# Patient Record
Sex: Female | Born: 1955 | Race: White | Hispanic: No | Marital: Single | Smoking: Former smoker
Health system: Southern US, Community
[De-identification: ages and names within clinical notes are randomized; demographics above are authoritative.]

## PROBLEM LIST (undated history)

## (undated) DIAGNOSIS — R0902 Hypoxemia: Secondary | ICD-10-CM

## (undated) DIAGNOSIS — IMO0002 Reserved for concepts with insufficient information to code with codable children: Secondary | ICD-10-CM

## (undated) DIAGNOSIS — F1910 Other psychoactive substance abuse, uncomplicated: Secondary | ICD-10-CM

## (undated) DIAGNOSIS — D126 Benign neoplasm of colon, unspecified: Secondary | ICD-10-CM

## (undated) DIAGNOSIS — G709 Myoneural disorder, unspecified: Secondary | ICD-10-CM

## (undated) DIAGNOSIS — E785 Hyperlipidemia, unspecified: Secondary | ICD-10-CM

## (undated) DIAGNOSIS — I422 Other hypertrophic cardiomyopathy: Secondary | ICD-10-CM

## (undated) DIAGNOSIS — A0472 Enterocolitis due to Clostridium difficile, not specified as recurrent: Secondary | ICD-10-CM

## (undated) DIAGNOSIS — I10 Essential (primary) hypertension: Secondary | ICD-10-CM

## (undated) DIAGNOSIS — K5790 Diverticulosis of intestine, part unspecified, without perforation or abscess without bleeding: Secondary | ICD-10-CM

## (undated) DIAGNOSIS — K589 Irritable bowel syndrome without diarrhea: Secondary | ICD-10-CM

## (undated) DIAGNOSIS — J449 Chronic obstructive pulmonary disease, unspecified: Secondary | ICD-10-CM

## (undated) DIAGNOSIS — G4730 Sleep apnea, unspecified: Secondary | ICD-10-CM

## (undated) DIAGNOSIS — J439 Emphysema, unspecified: Secondary | ICD-10-CM

## (undated) DIAGNOSIS — F32A Depression, unspecified: Secondary | ICD-10-CM

## (undated) DIAGNOSIS — T7840XA Allergy, unspecified, initial encounter: Secondary | ICD-10-CM

## (undated) DIAGNOSIS — H269 Unspecified cataract: Secondary | ICD-10-CM

## (undated) DIAGNOSIS — K219 Gastro-esophageal reflux disease without esophagitis: Secondary | ICD-10-CM

## (undated) DIAGNOSIS — R011 Cardiac murmur, unspecified: Secondary | ICD-10-CM

## (undated) HISTORY — DX: Myoneural disorder, unspecified: G70.9

## (undated) HISTORY — DX: Cardiac murmur, unspecified: R01.1

## (undated) HISTORY — DX: Essential (primary) hypertension: I10

## (undated) HISTORY — DX: Chronic obstructive pulmonary disease, unspecified: J44.9

## (undated) HISTORY — DX: Irritable bowel syndrome, unspecified: K58.9

## (undated) HISTORY — DX: Emphysema, unspecified: J43.9

## (undated) HISTORY — PX: TUBAL LIGATION: SHX77

## (undated) HISTORY — PX: EYE SURGERY: SHX253

## (undated) HISTORY — DX: Hyperlipidemia, unspecified: E78.5

## (undated) HISTORY — DX: Reserved for concepts with insufficient information to code with codable children: IMO0002

## (undated) HISTORY — DX: Unspecified cataract: H26.9

## (undated) HISTORY — DX: Sleep apnea, unspecified: G47.30

## (undated) HISTORY — DX: Other psychoactive substance abuse, uncomplicated: F19.10

## (undated) HISTORY — DX: Diverticulosis of intestine, part unspecified, without perforation or abscess without bleeding: K57.90

## (undated) HISTORY — DX: Depression, unspecified: F32.A

## (undated) HISTORY — DX: Gastro-esophageal reflux disease without esophagitis: K21.9

## (undated) HISTORY — DX: Allergy, unspecified, initial encounter: T78.40XA

## (undated) HISTORY — DX: Hypoxemia: R09.02

## (undated) HISTORY — DX: Benign neoplasm of colon, unspecified: D12.6

## (undated) HISTORY — DX: Enterocolitis due to Clostridium difficile, not specified as recurrent: A04.72

## (undated) HISTORY — PX: PLANTAR FASCIECTOMY: SUR600

---

## 1998-06-14 DIAGNOSIS — R0981 Nasal congestion: Secondary | ICD-10-CM

## 1998-06-14 HISTORY — DX: Nasal congestion: R09.81

## 1998-07-09 ENCOUNTER — Ambulatory Visit (HOSPITAL_COMMUNITY): Admission: RE | Admit: 1998-07-09 | Discharge: 1998-07-09 | Payer: Self-pay | Admitting: Obstetrics and Gynecology

## 1998-07-09 ENCOUNTER — Encounter: Payer: Self-pay | Admitting: Obstetrics and Gynecology

## 1998-07-28 ENCOUNTER — Other Ambulatory Visit: Admission: RE | Admit: 1998-07-28 | Discharge: 1998-07-28 | Payer: Self-pay | Admitting: Obstetrics and Gynecology

## 1998-10-02 ENCOUNTER — Encounter: Payer: Self-pay | Admitting: Obstetrics and Gynecology

## 1998-10-07 ENCOUNTER — Inpatient Hospital Stay (HOSPITAL_COMMUNITY): Admission: RE | Admit: 1998-10-07 | Discharge: 1998-10-08 | Payer: Self-pay | Admitting: Obstetrics and Gynecology

## 2000-06-14 HISTORY — PX: ABDOMINAL HYSTERECTOMY: SHX81

## 2001-07-05 ENCOUNTER — Other Ambulatory Visit: Admission: RE | Admit: 2001-07-05 | Discharge: 2001-07-05 | Payer: Self-pay | Admitting: *Deleted

## 2003-03-28 ENCOUNTER — Encounter: Payer: Self-pay | Admitting: Family Medicine

## 2003-03-28 ENCOUNTER — Encounter: Admission: RE | Admit: 2003-03-28 | Discharge: 2003-03-28 | Payer: Self-pay | Admitting: Family Medicine

## 2007-01-10 ENCOUNTER — Ambulatory Visit: Payer: Self-pay | Admitting: Family Medicine

## 2007-01-10 ENCOUNTER — Other Ambulatory Visit: Admission: RE | Admit: 2007-01-10 | Discharge: 2007-01-10 | Payer: Self-pay | Admitting: Family Medicine

## 2007-01-10 ENCOUNTER — Encounter: Payer: Self-pay | Admitting: Family Medicine

## 2007-01-13 ENCOUNTER — Encounter (INDEPENDENT_AMBULATORY_CARE_PROVIDER_SITE_OTHER): Payer: Self-pay | Admitting: *Deleted

## 2007-01-13 ENCOUNTER — Ambulatory Visit: Payer: Self-pay | Admitting: Family Medicine

## 2007-01-18 LAB — CONVERTED CEMR LAB
ALT: 23 units/L (ref 0–35)
AST: 22 units/L (ref 0–37)
Alkaline Phosphatase: 91 units/L (ref 39–117)
BUN: 13 mg/dL (ref 6–23)
Basophils Relative: 0.2 % (ref 0.0–1.0)
Bilirubin, Direct: 0.1 mg/dL (ref 0.0–0.3)
CO2: 30 meq/L (ref 19–32)
Calcium: 10.1 mg/dL (ref 8.4–10.5)
Chloride: 107 meq/L (ref 96–112)
Creatinine, Ser: 0.8 mg/dL (ref 0.4–1.2)
Eosinophils Relative: 3.1 % (ref 0.0–5.0)
Glucose, Bld: 102 mg/dL — ABNORMAL HIGH (ref 70–99)
HDL: 45.4 mg/dL (ref >39.0)
Lymphocytes Relative: 34.9 % (ref 12.0–46.0)
Monocytes Relative: 8.7 % (ref 3.0–11.0)
Neutro Abs: 5.2 10*3/uL (ref 1.4–7.7)
Platelets: 277 10*3/uL (ref 150–400)
RBC: 4.81 M/uL (ref 3.87–5.11)
RDW: 12.4 % (ref 11.5–14.6)
Total Bilirubin: 0.8 mg/dL (ref 0.3–1.2)
Total CHOL/HDL Ratio: 4.9
Total Protein: 7.1 g/dL (ref 6.0–8.3)
VLDL: 22 mg/dL (ref 0–40)
WBC: 9.9 10*3/uL (ref 4.5–10.5)

## 2007-02-01 ENCOUNTER — Encounter: Payer: Self-pay | Admitting: Family Medicine

## 2007-02-01 ENCOUNTER — Encounter: Admission: RE | Admit: 2007-02-01 | Discharge: 2007-02-01 | Payer: Self-pay | Admitting: Family Medicine

## 2007-02-08 ENCOUNTER — Encounter (INDEPENDENT_AMBULATORY_CARE_PROVIDER_SITE_OTHER): Payer: Self-pay | Admitting: *Deleted

## 2007-02-09 ENCOUNTER — Encounter: Admission: RE | Admit: 2007-02-09 | Discharge: 2007-02-09 | Payer: Self-pay | Admitting: Family Medicine

## 2007-02-15 ENCOUNTER — Encounter (INDEPENDENT_AMBULATORY_CARE_PROVIDER_SITE_OTHER): Payer: Self-pay | Admitting: *Deleted

## 2007-03-02 ENCOUNTER — Encounter: Payer: Self-pay | Admitting: Family Medicine

## 2007-07-10 ENCOUNTER — Ambulatory Visit: Payer: Self-pay | Admitting: Internal Medicine

## 2007-07-10 DIAGNOSIS — J069 Acute upper respiratory infection, unspecified: Secondary | ICD-10-CM | POA: Insufficient documentation

## 2007-07-13 ENCOUNTER — Ambulatory Visit: Payer: Self-pay | Admitting: Family Medicine

## 2007-07-18 ENCOUNTER — Encounter: Payer: Self-pay | Admitting: Family Medicine

## 2007-07-24 LAB — CONVERTED CEMR LAB
ALT: 21 units/L (ref 0–35)
AST: 19 units/L (ref 0–37)
Albumin: 3.9 g/dL (ref 3.5–5.2)
Bilirubin, Direct: 0.1 mg/dL (ref 0.0–0.3)
Calcium: 9.7 mg/dL (ref 8.4–10.5)
Chloride: 106 meq/L (ref 96–112)
Cholesterol: 190 mg/dL (ref 0–200)
GFR calc Af Amer: 85 mL/min
GFR calc non Af Amer: 70 mL/min
Glucose, Bld: 93 mg/dL (ref 70–99)
LDL Cholesterol: 120 mg/dL — ABNORMAL HIGH (ref 0–99)
Total CHOL/HDL Ratio: 3.9
VLDL: 20 mg/dL (ref 0–40)

## 2007-07-27 ENCOUNTER — Ambulatory Visit: Payer: Self-pay | Admitting: Family Medicine

## 2007-07-28 ENCOUNTER — Telehealth (INDEPENDENT_AMBULATORY_CARE_PROVIDER_SITE_OTHER): Payer: Self-pay | Admitting: *Deleted

## 2008-03-07 ENCOUNTER — Ambulatory Visit: Payer: Self-pay | Admitting: Family Medicine

## 2008-03-07 ENCOUNTER — Encounter (INDEPENDENT_AMBULATORY_CARE_PROVIDER_SITE_OTHER): Payer: Self-pay | Admitting: *Deleted

## 2008-03-07 DIAGNOSIS — J209 Acute bronchitis, unspecified: Secondary | ICD-10-CM | POA: Insufficient documentation

## 2008-04-01 ENCOUNTER — Encounter: Payer: Self-pay | Admitting: Family Medicine

## 2008-04-01 ENCOUNTER — Ambulatory Visit: Payer: Self-pay | Admitting: Family Medicine

## 2008-04-01 DIAGNOSIS — R51 Headache: Secondary | ICD-10-CM | POA: Insufficient documentation

## 2008-04-01 DIAGNOSIS — R519 Headache, unspecified: Secondary | ICD-10-CM | POA: Insufficient documentation

## 2008-04-01 DIAGNOSIS — D235 Other benign neoplasm of skin of trunk: Secondary | ICD-10-CM | POA: Insufficient documentation

## 2008-04-02 ENCOUNTER — Telehealth (INDEPENDENT_AMBULATORY_CARE_PROVIDER_SITE_OTHER): Payer: Self-pay | Admitting: *Deleted

## 2008-04-09 ENCOUNTER — Encounter (INDEPENDENT_AMBULATORY_CARE_PROVIDER_SITE_OTHER): Payer: Self-pay | Admitting: *Deleted

## 2008-04-11 ENCOUNTER — Ambulatory Visit: Payer: Self-pay | Admitting: Family Medicine

## 2008-12-30 ENCOUNTER — Ambulatory Visit: Payer: Self-pay | Admitting: Family Medicine

## 2008-12-30 DIAGNOSIS — Z780 Asymptomatic menopausal state: Secondary | ICD-10-CM | POA: Insufficient documentation

## 2009-01-15 ENCOUNTER — Encounter (INDEPENDENT_AMBULATORY_CARE_PROVIDER_SITE_OTHER): Payer: Self-pay | Admitting: *Deleted

## 2009-01-20 ENCOUNTER — Ambulatory Visit: Payer: Self-pay | Admitting: Internal Medicine

## 2009-01-20 ENCOUNTER — Encounter: Admission: RE | Admit: 2009-01-20 | Discharge: 2009-01-20 | Payer: Self-pay | Admitting: Family Medicine

## 2009-01-21 ENCOUNTER — Encounter: Admission: RE | Admit: 2009-01-21 | Discharge: 2009-01-21 | Payer: Self-pay | Admitting: Family Medicine

## 2009-01-21 ENCOUNTER — Encounter: Payer: Self-pay | Admitting: Family Medicine

## 2009-01-22 ENCOUNTER — Encounter (INDEPENDENT_AMBULATORY_CARE_PROVIDER_SITE_OTHER): Payer: Self-pay | Admitting: *Deleted

## 2009-01-30 ENCOUNTER — Ambulatory Visit: Payer: Self-pay | Admitting: Internal Medicine

## 2009-01-30 ENCOUNTER — Encounter: Payer: Self-pay | Admitting: Internal Medicine

## 2009-01-30 ENCOUNTER — Encounter: Payer: Self-pay | Admitting: Family Medicine

## 2009-01-30 LAB — HM COLONOSCOPY

## 2009-02-09 ENCOUNTER — Encounter: Payer: Self-pay | Admitting: Internal Medicine

## 2009-02-14 ENCOUNTER — Ambulatory Visit: Payer: Self-pay | Admitting: Family Medicine

## 2009-02-14 DIAGNOSIS — K589 Irritable bowel syndrome without diarrhea: Secondary | ICD-10-CM | POA: Insufficient documentation

## 2009-02-19 ENCOUNTER — Ambulatory Visit: Payer: Self-pay | Admitting: Family Medicine

## 2009-02-19 ENCOUNTER — Encounter (INDEPENDENT_AMBULATORY_CARE_PROVIDER_SITE_OTHER): Payer: Self-pay | Admitting: *Deleted

## 2009-02-19 LAB — CONVERTED CEMR LAB
OCCULT 2: NEGATIVE
OCCULT 3: NEGATIVE

## 2009-02-28 ENCOUNTER — Ambulatory Visit: Payer: Self-pay | Admitting: Family Medicine

## 2009-02-28 DIAGNOSIS — I10 Essential (primary) hypertension: Secondary | ICD-10-CM | POA: Insufficient documentation

## 2009-03-04 ENCOUNTER — Encounter: Payer: Self-pay | Admitting: Family Medicine

## 2009-03-05 ENCOUNTER — Encounter: Payer: Self-pay | Admitting: Family Medicine

## 2009-03-10 ENCOUNTER — Ambulatory Visit: Payer: Self-pay | Admitting: Internal Medicine

## 2009-03-10 DIAGNOSIS — R5383 Other fatigue: Secondary | ICD-10-CM | POA: Insufficient documentation

## 2009-03-10 DIAGNOSIS — R5381 Other malaise: Secondary | ICD-10-CM

## 2009-03-11 ENCOUNTER — Telehealth: Payer: Self-pay | Admitting: Family Medicine

## 2009-03-11 LAB — CONVERTED CEMR LAB
ALT: 15 units/L (ref 0–35)
Alkaline Phosphatase: 89 units/L (ref 39–117)
Basophils Relative: 0.4 % (ref 0.0–3.0)
Bilirubin, Direct: 0 mg/dL (ref 0.0–0.3)
Chloride: 109 meq/L (ref 96–112)
Creatinine, Ser: 0.9 mg/dL (ref 0.4–1.2)
Eosinophils Relative: 1.8 % (ref 0.0–5.0)
Lymphocytes Relative: 29.6 % (ref 12.0–46.0)
MCV: 91.6 fL (ref 78.0–100.0)
Monocytes Absolute: 0.7 10*3/uL (ref 0.1–1.0)
Neutrophils Relative %: 61 % (ref 43.0–77.0)
RBC: 4.57 M/uL (ref 3.87–5.11)
Sodium: 145 meq/L (ref 135–145)
Total Protein: 7.2 g/dL (ref 6.0–8.3)
WBC: 9.8 10*3/uL (ref 4.5–10.5)

## 2009-03-18 ENCOUNTER — Encounter: Payer: Self-pay | Admitting: Physician Assistant

## 2009-03-18 ENCOUNTER — Ambulatory Visit: Payer: Self-pay | Admitting: Gastroenterology

## 2009-03-18 DIAGNOSIS — Z860100 Personal history of colon polyps, unspecified: Secondary | ICD-10-CM | POA: Insufficient documentation

## 2009-03-18 DIAGNOSIS — R197 Diarrhea, unspecified: Secondary | ICD-10-CM | POA: Insufficient documentation

## 2009-03-18 DIAGNOSIS — A09 Infectious gastroenteritis and colitis, unspecified: Secondary | ICD-10-CM | POA: Insufficient documentation

## 2009-03-19 ENCOUNTER — Ambulatory Visit: Payer: Self-pay | Admitting: Family Medicine

## 2009-03-19 DIAGNOSIS — F3289 Other specified depressive episodes: Secondary | ICD-10-CM | POA: Insufficient documentation

## 2009-03-19 DIAGNOSIS — R209 Unspecified disturbances of skin sensation: Secondary | ICD-10-CM | POA: Insufficient documentation

## 2009-03-19 DIAGNOSIS — F329 Major depressive disorder, single episode, unspecified: Secondary | ICD-10-CM

## 2009-03-21 ENCOUNTER — Encounter: Payer: Self-pay | Admitting: Family Medicine

## 2009-03-21 LAB — CONVERTED CEMR LAB
CRP, High Sensitivity: 6.6 — ABNORMAL HIGH (ref 0.00–5.00)
Folate: 11.9 ng/mL
HCV Ab: NEGATIVE
TSH: 1.05 microintl units/mL (ref 0.35–5.50)
Vitamin B-12: 330 pg/mL (ref 211–911)

## 2009-04-02 ENCOUNTER — Encounter: Payer: Self-pay | Admitting: Family Medicine

## 2009-04-14 ENCOUNTER — Ambulatory Visit: Payer: Self-pay | Admitting: Family Medicine

## 2009-04-14 DIAGNOSIS — IMO0001 Reserved for inherently not codable concepts without codable children: Secondary | ICD-10-CM | POA: Insufficient documentation

## 2009-04-16 ENCOUNTER — Telehealth (INDEPENDENT_AMBULATORY_CARE_PROVIDER_SITE_OTHER): Payer: Self-pay | Admitting: *Deleted

## 2009-05-05 ENCOUNTER — Telehealth: Payer: Self-pay | Admitting: Family Medicine

## 2009-05-30 ENCOUNTER — Ambulatory Visit: Payer: Self-pay | Admitting: Family Medicine

## 2009-06-03 ENCOUNTER — Encounter: Payer: Self-pay | Admitting: Family Medicine

## 2009-06-17 ENCOUNTER — Telehealth: Payer: Self-pay | Admitting: Family Medicine

## 2009-07-04 ENCOUNTER — Encounter: Payer: Self-pay | Admitting: Family Medicine

## 2009-07-18 ENCOUNTER — Ambulatory Visit: Payer: Self-pay | Admitting: Family Medicine

## 2009-07-18 DIAGNOSIS — K5900 Constipation, unspecified: Secondary | ICD-10-CM | POA: Insufficient documentation

## 2009-07-30 ENCOUNTER — Telehealth: Payer: Self-pay | Admitting: Family Medicine

## 2009-08-12 ENCOUNTER — Telehealth: Payer: Self-pay | Admitting: Family Medicine

## 2009-08-13 ENCOUNTER — Encounter: Payer: Self-pay | Admitting: Family Medicine

## 2009-08-13 ENCOUNTER — Telehealth (INDEPENDENT_AMBULATORY_CARE_PROVIDER_SITE_OTHER): Payer: Self-pay | Admitting: *Deleted

## 2009-08-18 ENCOUNTER — Encounter: Payer: Self-pay | Admitting: Family Medicine

## 2009-09-15 ENCOUNTER — Encounter: Payer: Self-pay | Admitting: Family Medicine

## 2009-11-27 ENCOUNTER — Encounter: Payer: Self-pay | Admitting: Family Medicine

## 2009-12-04 ENCOUNTER — Ambulatory Visit: Payer: Self-pay | Admitting: Family Medicine

## 2009-12-04 DIAGNOSIS — R05 Cough: Secondary | ICD-10-CM | POA: Insufficient documentation

## 2009-12-04 DIAGNOSIS — R059 Cough, unspecified: Secondary | ICD-10-CM | POA: Insufficient documentation

## 2010-01-02 ENCOUNTER — Telehealth: Payer: Self-pay | Admitting: Family Medicine

## 2010-01-12 ENCOUNTER — Ambulatory Visit: Payer: Self-pay | Admitting: Family Medicine

## 2010-01-14 ENCOUNTER — Telehealth: Payer: Self-pay | Admitting: Family Medicine

## 2010-01-15 ENCOUNTER — Ambulatory Visit: Payer: Self-pay | Admitting: Family Medicine

## 2010-01-19 ENCOUNTER — Telehealth: Payer: Self-pay | Admitting: Family Medicine

## 2010-01-19 ENCOUNTER — Telehealth (INDEPENDENT_AMBULATORY_CARE_PROVIDER_SITE_OTHER): Payer: Self-pay | Admitting: *Deleted

## 2010-02-23 ENCOUNTER — Telehealth (INDEPENDENT_AMBULATORY_CARE_PROVIDER_SITE_OTHER): Payer: Self-pay | Admitting: *Deleted

## 2010-02-23 ENCOUNTER — Encounter: Payer: Self-pay | Admitting: Family Medicine

## 2010-05-18 ENCOUNTER — Ambulatory Visit: Payer: Self-pay | Admitting: Family Medicine

## 2010-07-12 LAB — CONVERTED CEMR LAB
ALT: 22 units/L (ref 0–35)
AST: 23 units/L (ref 0–37)
AST: 24 units/L (ref 0–37)
Alkaline Phosphatase: 90 units/L (ref 39–117)
BUN: 13 mg/dL (ref 6–23)
Basophils Absolute: 0.2 10*3/uL — ABNORMAL HIGH (ref 0.0–0.1)
Basophils Relative: 0.6 % (ref 0.0–3.0)
Bilirubin, Direct: 0.1 mg/dL (ref 0.0–0.3)
Bilirubin, Direct: 0.1 mg/dL (ref 0.0–0.3)
Blood in Urine, dipstick: NEGATIVE
Calcium: 10 mg/dL (ref 8.4–10.5)
Chloride: 109 meq/L (ref 96–112)
Creatinine, Ser: 0.8 mg/dL (ref 0.4–1.2)
Direct LDL: 195.7 mg/dL
Eosinophils Relative: 3.8 % (ref 0.0–5.0)
GFR calc non Af Amer: 93.28 mL/min (ref >60)
Glucose, Bld: 67 mg/dL — ABNORMAL LOW (ref 70–99)
Glucose, Urine, Semiquant: NEGATIVE
HCT: 40.8 % (ref 36.0–46.0)
HDL: 49.3 mg/dL (ref >39.00)
Ketones, urine, test strip: NEGATIVE
Lymphocytes Relative: 49.5 % — ABNORMAL HIGH (ref 12.0–46.0)
Lymphs Abs: 4.1 10*3/uL — ABNORMAL HIGH (ref 0.7–4.0)
MCV: 88.5 fL (ref 78.0–100.0)
Monocytes Absolute: 1 10*3/uL (ref 0.1–1.0)
Monocytes Relative: 16.3 % — ABNORMAL HIGH (ref 3.0–12.0)
Neutrophils Relative %: 29.4 % — ABNORMAL LOW (ref 43.0–77.0)
Neutrophils Relative %: 35.4 % — ABNORMAL LOW (ref 43.0–77.0)
Nitrite: NEGATIVE
Platelets: 239 10*3/uL (ref 150.0–400.0)
Potassium: 4.6 meq/L (ref 3.5–5.1)
Protein, U semiquant: NEGATIVE
RBC: 4.61 M/uL (ref 3.87–5.11)
RDW: 12.6 % (ref 11.5–14.6)
Specific Gravity, Urine: <1.005
TSH: 1.12 microintl units/mL (ref 0.35–5.50)
Total Bilirubin: 0.9 mg/dL (ref 0.3–1.2)
Total CHOL/HDL Ratio: 5
Total Protein: 7.2 g/dL (ref 6.0–8.3)
Triglycerides: 162 mg/dL — ABNORMAL HIGH (ref 0.0–149.0)
VLDL: 32.4 mg/dL (ref 0.0–40.0)
VLDL: 35.4 mg/dL (ref 0.0–40.0)
WBC: 8.4 10*3/uL (ref 4.5–10.5)
WBC: 8.7 10*3/uL (ref 4.5–10.5)
pH: 6.5

## 2010-07-16 NOTE — Progress Notes (Signed)
Summary: ? RESTART CYMBALTA  Phone Note Call from Patient Call back at Home Phone [phone]   Caller: Patient Summary of Call: patient weaned off cymbalta about two weeks ago - she was taking cymbalta & celebrex & she thought she could stop taking one but she thinks she stopped taking the wrong one - she wants to know if she could start taking cymbalta again -- patient said she has an appt [id number] but didnt want to wait until then to ask  Initial call taken by: Okey Regal [place],  January 02, 2010 3:22 PM  Follow-up for Phone Call        pls advise.................Marland KitchenFelecia Deloach CMA  January 02, 2010 3:55 PM   Additional Follow-up for Phone Call Additional follow up Details #1::        she needs to start with 30 mg for at least a week and then increase 60 mg  daily Additional Follow-up by: Loreen Freud DO,  January 02, 2010 4:42 PM    Additional Follow-up for Phone Call Additional follow up Details #2::    pt aware rx sent to pharmacy and samples placed up front for pickup..............Marland KitchenFelecia Deloach CMA  January 02, 2010 4:47 PM   New/Updated Medications: CYMBALTA 60 MG CPEP (DULOXETINE HCL) Take 1 tab  daily Prescriptions: CYMBALTA 60 MG CPEP (DULOXETINE HCL) Take 1 tab  daily  #30 x 5   Entered by:   Jeremy Johann CMA   Authorized by:   Loreen Freud DO   Signed by:   Jeremy Johann CMA on 01/02/2010   Method used:   Faxed to ...       [facility] [address] [phone]* (retail)       [address]       [place], [place]  [address]       Ph: [phone]       Fax: [phone]   RxID:   [id number]

## 2010-07-16 NOTE — Medication Information (Signed)
Summary: Letter Regarding Lipitor & Skeletal Muscle Effects/[facility]  Letter Regarding Lipitor & Skeletal Muscle Effects/[facility]   Imported By: Lanelle Bal 03/23/2010 14:17:31  _____________________________________________________________________  External Attachment:    Type:   Image     Comment:   External Document

## 2010-07-16 NOTE — Miscellaneous (Signed)
Summary: PT Re Eval/[facility]  PT Re Eval/[facility]   Imported By: Lanelle Bal 07/22/2009 09:07:20  _____________________________________________________________________  External Attachment:    Type:   Image     Comment:   External Document

## 2010-07-16 NOTE — Progress Notes (Signed)
Summary: DISABILITY FORM  Phone Note Call from Patient Call back at Home Phone [phone]   Caller: Patient Call For: Loreen Freud DO Reason for Call: Talk to Doctor Summary of Call: PATIENT CALLING, REQUESTING TO SPEAK WITH DR. [name] ABOUT THE DISABLITIY FORM SHE DROPPED OFF ON 06-16-2009, & LEFT AT OUR FRONT DESK TO BE COMPLETED BY DR. [name].  REGARDING THE PREVIOUS FORM THAT WAS COMPLETED WITH THE "DATE OF RETURN TO WORK LISTED AS UNKNOWN", THIS WAS NOT ACCEPTED AS PREDICTED.  PATIENT REQUESTING TO TALK WITH DR. [name] ABOUT THIS. Initial call taken by: Magdalen Spatz [facility],  June 17, 2009 2:39 PM  Follow-up for Phone Call        pt states the forms need a date.  Follow-up by: Army Fossa CMA,  June 17, 2009 4:36 PM  Additional Follow-up for Phone Call Additional follow up Details #1::        pt will need f/u in 1 month---  with new papers if disability needs to con't Additional Follow-up by: Loreen Freud DO,  June 17, 2009 4:44 PM    Additional Follow-up for Phone Call Additional follow up Details #2::    pt aware.  Follow-up by: Army Fossa CMA,  June 17, 2009 4:48 PM

## 2010-07-16 NOTE — Assessment & Plan Note (Signed)
Summary: cpx/cbs   Vital Signs:  Patient profile:   55 year old female Height:      68.5 inches (173.99 cm) Weight:      205.38 pounds (93.35 kg) BMI:     30.88 Temp:     98.3 degrees F (36.83 degrees C) oral BP sitting:   130 / 72  (right arm) Cuff size:   regular  Vitals Entered By: Lucious Groves CMA (January 12, 2010 1:11 PM) CC: CPX./kb Is Patient Diabetic? No Pain Assessment Patient in pain? no      Comments Patient states that she is not taking vitamin d./kb   History of Present Illness: Pt here for cpe and labs---no pap--pt is s/p TAH.    Preventive Screening-Counseling & Management  Alcohol-Tobacco     Alcohol drinks/day: <1     Alcohol type: beer, Mead     Smoking Status: current     Smoking Cessation Counseling: yes     Smoke Cessation Stage: contemplative     Packs/Day: 0.75     Year Started: 1971     Passive Smoke Exposure: no  Caffeine-Diet-Exercise     Caffeine use/day: 4-6     Does Patient Exercise: yes     Type of exercise: 1/2 mile walk and weight machines     Times/week: 3-4  Hep-HIV-STD-Contraception     HIV Risk: no     Dental Visit-last 6 months no     SBE monthly: yes     SBE Education/Counseling: not indicated; SBE done regularly     Sun Exposure-Excessive: rarely  Safety-Violence-Falls     Seat Belt Use: yes  Current Medications (verified): 1)  Norvasc 5 Mg Tabs (Amlodipine Besylate) .Marland Kitchen.. 1 By Mouth Once Daily 2)  Robinul-Forte 2 Mg Tabs (Glycopyrrolate) .... Take 1 Qab Two Times A Day As Needed For Urgency 3)  Vitamin D 1000 Unit Tabs (Cholecalciferol) 4)  Celebrex 200 Mg Caps (Celecoxib) .Marland Kitchen.. 1 By Mouth Once Daily 5)  Cymbalta 60 Mg Cpep (Duloxetine Hcl) .... Take 1 Tab  Daily  Allergies (verified): 1)  ! Pcn 2)  ! Paregoric (Paregoric) 3)  ! * Novacain  Past History:  Past Medical History: Last updated: 03/18/2009 Headache Hypertension Adenomatous Colon Polyps Diverticulosis Hyperlipidemia Irritable Bowel  Syndrome C-Diff  Past Surgical History: Last updated: 01/10/2007 Hysterectomy-2002  Family History: Last updated: 2009-04-06 MOTHER-DECEASED FATHER-DECEASED 1 SISTER-LIVING Family History Hypertension-MOTHER, PGM Family History Diabetes 1st degree relative-MGM Family History Depression-MOTHER HEART-MOTHER(HEART ATTACK) Family History of Breast Cancer:Sister Family History of Uterine Cancer:Mother  Social History: Last updated: 04/06/2009 Occupation:teacher, 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 MOTHER-DECEASED FATHER-DECEASED 1 SISTER-LIVING Family History Hypertension-MOTHER, PGM Family History Diabetes 1st degree relative-MGM Family History Depression-MOTHER HEART-MOTHER(HEART ATTACK) Family History of Breast Cancer:Sister Family History of Uterine Cancer:Mother  Social History: Reviewed history from 04-06-09 and no changes required. 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 Eyes:  Denies blurring,  discharge, double vision, eye irritation, eye pain, halos, itching, light sensitivity, red eye, vision loss-1 eye, and vision loss-both eyes; optho q2y. ENT:  Denies decreased hearing, difficulty swallowing, ear discharge, earache, hoarseness, nasal  congestion, nosebleeds, postnasal drainage, ringing in ears, sinus pressure, and sore throat. CV:  Denies bluish discoloration of lips or nails, chest pain or discomfort, difficulty breathing at night, difficulty breathing while lying down, fainting, fatigue, leg cramps with exertion, lightheadness, near fainting, palpitations, shortness of breath with exertion, swelling of feet, swelling of hands, and weight gain. Resp:  Denies chest discomfort, chest pain with inspiration, cough, coughing up blood, excessive snoring, hypersomnolence, morning headaches, pleuritic, shortness of breath, sputum productive, and wheezing. GI:  Denies abdominal pain, bloody stools, change in bowel habits, constipation, dark tarry stools, diarrhea, excessive appetite, gas, hemorrhoids, indigestion, and loss of appetite. GU:  Denies abnormal vaginal bleeding, decreased libido, discharge, dysuria, genital sores, hematuria, incontinence, nocturia, urinary frequency, and urinary hesitancy. MS:  Complains of joint pain and muscle aches; denies joint redness, joint swelling, loss of strength, low back pain, mid back pain, muscle , cramps, muscle weakness, stiffness, and thoracic pain. Derm:  Denies changes in color of skin, changes in nail beds, dryness, excessive perspiration, flushing, hair loss, insect bite(s), itching, lesion(s), poor wound healing, and rash. Neuro:  Complains of headaches; denies brief paralysis, difficulty with concentration, disturbances in coordination, falling down, inability to speak, memory loss, numbness, poor balance, seizures, sensation of room spinning, tingling, tremors, visual disturbances, and weakness. Psych:  Complains of depression; denies alternate hallucination ( auditory/visual), anxiety, easily angered, easily tearful, irritability, mental problems, panic attacks, sense of great danger, suicidal thoughts/plans, thoughts of violence, unusual visions or sounds, and thoughts /plans of harming  others. Endo:  Denies cold intolerance, excessive hunger, excessive thirst, excessive urination, heat intolerance, polyuria, and weight change. Heme:  Denies abnormal bruising, bleeding, enlarge lymph nodes, fevers, pallor, and skin discoloration. Allergy:  Denies hives or rash, itching eyes, persistent infections, seasonal allergies, and sneezing.  Physical Exam  General:  Well-developed,well-nourished,in no acute distress; alert,appropriate and cooperative throughout examination Head:  Normocephalic and atraumatic without obvious abnormalities. No apparent alopecia or balding. Eyes:  vision grossly intact, pupils equal, pupils round, pupils reactive to light, and no injection.   Ears:  External ear exam shows no significant lesions or deformities.  Otoscopic examination reveals clear canals, tympanic membranes are intact bilaterally without bulging, retraction, inflammation or discharge. Hearing is grossly normal bilaterally. Nose:  External nasal examination shows no deformity or inflammation. Nasal mucosa are pink and moist without lesions or exudates. Mouth:  Oral mucosa and oropharynx without lesions or exudates.  Teeth in good repair. Neck:  No deformities, masses, or tenderness noted.no carotid bruits.   Chest Wall:  No deformities, masses, or tenderness noted. Lungs:  Normal respiratory effort, chest expands symmetrically. Lungs are clear to auscultation, no crackles or wheezes. Heart:  normal rate and no murmur.   Abdomen:  Bowel sounds positive,abdomen soft and non-tender without masses, organomegaly or hernias noted. Msk:  normal ROM, no joint tenderness, no joint swelling, no joint warmth, no redness over joints, no joint deformities, no joint instability, and no crepitation.   Pulses:  R posterior tibial normal, R dorsalis pedis normal, R carotid normal, L posterior tibial normal, L dorsalis pedis normal, and L carotid normal.   Extremities:  No clubbing, cyanosis, edema, or  deformity noted with normal full range of motion of all joints.   Neurologic:  No cranial nerve deficits  noted. Station and gait are normal. Plantar reflexes are down-going bilaterally. DTRs are symmetrical throughout. Sensory, motor and coordinative functions appear intact. Skin:  Intact without suspicious lesions or rashes Cervical Nodes:  No lymphadenopathy noted Axillary Nodes:  No palpable lymphadenopathy Psych:  Cognition and judgment appear intact. Alert and cooperative with normal attention span and concentration. No apparent delusions, illusions, hallucinations   Impression & Recommendations:  Problem # 1:  PREVENTIVE HEALTH CARE (ICD-V70.0)  Orders: Venipuncture (04540) TLB-Lipid Panel (80061-LIPID) TLB-BMP (Basic Metabolic Panel-BMET) (80048-METABOL) TLB-CBC Platelet - w/Differential (85025-CBCD) TLB-Hepatic/Liver Function Pnl (80076-HEPATIC) TLB-TSH (Thyroid Stimulating Hormone) (84443-TSH) T-Vitamin D (25-Hydroxy) (98119-14782) EKG w/ Interpretation (93000)  Problem # 2:  FIBROMYALGIA (ICD-729.1)  Her updated medication list for this problem includes:    Celebrex 200 Mg Caps (Celecoxib) .Marland Kitchen... 1 by mouth once daily  Orders: Venipuncture (95621) TLB-Lipid Panel (80061-LIPID) TLB-BMP (Basic Metabolic Panel-BMET) (80048-METABOL) TLB-CBC Platelet - w/Differential (85025-CBCD) TLB-Hepatic/Liver Function Pnl (80076-HEPATIC) TLB-TSH (Thyroid Stimulating Hormone) (84443-TSH) T-Vitamin D (25-Hydroxy) (30865-78469) Specimen Handling (62952) EKG w/ Interpretation (93000)  Problem # 3:  DEPRESSION (ICD-311)  Her updated medication list for this problem includes:    Cymbalta 60 Mg Cpep (Duloxetine hcl) .Marland Kitchen... Take 1 tab  daily  Orders: Venipuncture (84132) TLB-Lipid Panel (80061-LIPID) TLB-BMP (Basic Metabolic Panel-BMET) (80048-METABOL) TLB-CBC Platelet - w/Differential (85025-CBCD) TLB-Hepatic/Liver Function Pnl (80076-HEPATIC) TLB-TSH (Thyroid Stimulating Hormone)  (84443-TSH) T-Vitamin D (25-Hydroxy) (44010-27253) Specimen Handling (66440) EKG w/ Interpretation (93000)  Discussed treatment options, including trial of antidpressant medication. Will refer to behavioral health. Follow-up call in in 24-48 hours and recheck in 2 weeks, sooner as needed. Patient agrees to call if any worsening of symptoms or thoughts of doing harm arise. Verified that the patient has no suicidal ideation at this time.   Problem # 4:  HYPERTENSION (ICD-401.9)  Her updated medication list for this problem includes:    Norvasc 5 Mg Tabs (Amlodipine besylate) .Marland Kitchen... 1 by mouth once daily  Orders: Venipuncture (34742) TLB-Lipid Panel (80061-LIPID) TLB-BMP (Basic Metabolic Panel-BMET) (80048-METABOL) TLB-CBC Platelet - w/Differential (85025-CBCD) TLB-Hepatic/Liver Function Pnl (80076-HEPATIC) TLB-TSH (Thyroid Stimulating Hormone) (84443-TSH) T-Vitamin D (25-Hydroxy) (59563-87564) Specimen Handling (33295) EKG w/ Interpretation (93000)  BP today: 130/72 Prior BP: 122/76 (12/04/2009)  Labs Reviewed: K+: 4.4 (02/28/2009) Creat: : 0.9 (02/28/2009)   Chol: 250 (12/30/2008)   HDL: 49.30 (12/30/2008)   LDL: 120 (07/13/2007)   TG: 162.0 (12/30/2008)  Problem # 5:  POSTMENOPAUSAL STATUS (ICD-V49.81)  Orders: Venipuncture (18841) TLB-Lipid Panel (80061-LIPID) TLB-BMP (Basic Metabolic Panel-BMET) (80048-METABOL) TLB-CBC Platelet - w/Differential (85025-CBCD) TLB-Hepatic/Liver Function Pnl (80076-HEPATIC) TLB-TSH (Thyroid Stimulating Hormone) (84443-TSH) T-Vitamin D (25-Hydroxy) (66063-01601) Specimen Handling (09323) EKG w/ Interpretation (93000)  Complete Medication List: 1)  Norvasc 5 Mg Tabs (Amlodipine besylate) .Marland Kitchen.. 1 by mouth once daily 2)  Robinul-forte 2 Mg Tabs (Glycopyrrolate) .... Take 1 qab two times a day as needed for urgency 3)  Vitamin D 1000 Unit Tabs (Cholecalciferol) 4)  Celebrex 200 Mg Caps (Celecoxib) .Marland Kitchen.. 1 by mouth once daily 5)  Cymbalta 60 Mg  Cpep (Duloxetine hcl) .... Take 1 tab  daily   EKG  Procedure date:  01/12/2010  Findings:      Normal sinus rhythm with rate of:  66 bpm    PAP Result Date:  01/25/2007 PAP Result:  normal PAP Next Due:  3 yr Last Mammogram:  ASSESSMENT: Negative - BI-RADS 1^MM DIGITAL SCREENING (01/20/2009 1:11:00 PM) Mammogram Result Date:  01/12/2010 Mammogram Next Due:  Refused

## 2010-07-16 NOTE — Medication Information (Signed)
Summary: Approval for Celebrex/[facility]  Approval for Celebrex/[facility]   Imported By: Lanelle Bal 08/20/2009 11:19:55  _____________________________________________________________________  External Attachment:    Type:   Image     Comment:   External Document

## 2010-07-16 NOTE — Progress Notes (Signed)
Summary: Celexa  Phone Note Call from Patient Call back at Home Phone [phone]   Summary of Call: Pt states that she is currently on Celexa also, she says she was given samples at her last visit. Is this okay to fill for her?  Initial call taken by: Army Fossa CMA,  July 30, 2009 1:19 PM  Follow-up for Phone Call        No ---she needs to be taking one or the other --not both---celexa is not on her med list Follow-up by: Loreen Freud DO,  July 30, 2009 1:29 PM  Additional Follow-up for Phone Call Additional follow up Details #1::        Pt is aware. Army Fossa CMA  July 30, 2009 1:34 PM

## 2010-07-16 NOTE — Progress Notes (Signed)
Summary: prior auth APPROVED celebrex [facility]  Phone Note From Pharmacy   Caller: [facility] [address] [place]. [phone]* Summary of Call: prior auth [id number], celebrex 200mg , prior auth in process [facility] awaiting fax.....................Marland KitchenFelecia Deloach CMA  August 13, 2009 9:58 AM   prior auth faxed back awaiting response.......................Marland KitchenFelecia Deloach CMA  August 13, 2009 10:34 AM   Follow-up for Phone Call        prior auth APPROVED 07-23-09 UNTIL 08-13-10, PHARMACY  FAX, APPROVAL LETTER SCAN TO CHART................Marland KitchenFelecia Deloach CMA  August 14, 2009 9:24 AM

## 2010-07-16 NOTE — Miscellaneous (Signed)
Summary: PT Discharge/[facility]  PT Discharge/[facility]   Imported By: Lanelle Bal 12/18/2009 11:33:40  _____________________________________________________________________  External Attachment:    Type:   Image     Comment:   External Document

## 2010-07-16 NOTE — Progress Notes (Signed)
Summary: Status of Forms  Phone Note Call from Patient Call back at Home Phone [phone]   Caller: Patient Call For: Loreen Freud DO Summary of Call: Patient is checking the status of the form she dropped off last Monday. Initial call taken by: Barnie Mort,  January 19, 2010 9:52 AM  Follow-up for Phone Call        I did them last week Follow-up by: Loreen Freud DO,  January 19, 2010 10:17 AM  Additional Follow-up for Phone Call Additional follow up Details #1::        pt aware forms ready for pick-up.................Marland KitchenFelecia Deloach CMA  January 19, 2010 4:39 PM     Additional Follow-up for Phone Call Additional follow up Details #2::    pt states that at [facility] it was discuss about some drops for her ear sbut some how she forgot to get them. pt [facility] [address]. pls advise........................Marland KitchenFelecia Deloach CMA  January 19, 2010 4:42 PM

## 2010-07-16 NOTE — Medication Information (Signed)
Summary: Prior Authorization for Celebrex/[facility]  Prior Authorization for Celebrex/[facility]   Imported By: Lanelle Bal 08/21/2009 10:04:53  _____________________________________________________________________  External Attachment:    Type:   Image     Comment:   External Document

## 2010-07-16 NOTE — Miscellaneous (Signed)
Summary: PT Eval/[facility]  PT Eval/[facility]   Imported By: Lanelle Bal 06/20/2009 12:54:58  _____________________________________________________________________  External Attachment:    Type:   Image     Comment:   External Document

## 2010-07-16 NOTE — Progress Notes (Signed)
Summary: lipitor refill   Phone Note Refill Request Message from:  Patient on February 23, 2010 4:45 PM  Caller: Patient    Prescriptions: LIPITOR 10 MG TABS (ATORVASTATIN CALCIUM) 1 by mouth qhs  #30 x 3   Entered by:   Doristine Devoid CMA   Authorized by:   Loreen Freud DO   Signed by:   Doristine Devoid CMA on 02/23/2010   Method used:   Electronically to        [facility] [phone]* (retail)       [address]       [place], [place]  [address]       Ph: [phone]       Fax: [phone]   RxID:   [id number]

## 2010-07-16 NOTE — Progress Notes (Signed)
Summary: CALL PT medical questions  Phone Note Call from Patient Call back at Home Phone [phone]   Caller: Patient Summary of Call: pt. has questions about [facility] report and also has questions about medicine. Pt. is requesting Dr.Lowne call her at [phone] Initial call taken by: Michaelle Copas,  August 12, 2009 11:14 AM  Follow-up for Phone Call        pt wants to know if you ever received report from dr Marjory Lies. pt also has some concern about her meds that she would rather discuss with you. Offer pt appt pt decline stating that she would prefer that dr [name] given her a call......................Marland KitchenFelecia Deloach CMA  August 12, 2009 11:24 AM   Additional Follow-up for Phone Call Additional follow up Details #1::        When did she see neuro?  I don't see a report from them. Additional Follow-up by: Loreen Freud DO,  August 12, 2009 11:41 AM    Additional Follow-up for Phone Call Additional follow up Details #2::    pt is referring to report from OCT/2010 when she was referred to neuro...Marland KitchenMarland KitchenFelecia Deloach CMA  August 12, 2009 12:21 PM   Additional Follow-up for Phone Call Additional follow up Details #3:: Details for Additional Follow-up Action Taken: Pt is requesting celebrex rx because samples worked well and has not noticed any difference with 2 cymbalta so she would like to go back to 1 a day.  celebrex sent to pharmacy Additional Follow-up by: Loreen Freud DO,  August 12, 2009 12:56 PM  New/Updated Medications: CYMBALTA 60 MG CPEP (DULOXETINE HCL) 1 by mouth once daily CELEBREX 200 MG CAPS (CELECOXIB) 1 by mouth once daily Prescriptions: CYMBALTA 60 MG CPEP (DULOXETINE HCL) 1 by mouth once daily  #30 x 5   Entered and Authorized by:   Loreen Freud DO   Signed by:   Loreen Freud DO on 08/12/2009   Method used:   Electronically to        [facility]. [phone]* (retail)       [address]       [place], [place]  [address]       Ph:  [phone]       Fax: [phone]   RxID:   [id number] CELEBREX 200 MG CAPS (CELECOXIB) 1 by mouth once daily  #30 x 5   Entered and Authorized by:   Loreen Freud DO   Signed by:   Loreen Freud DO on 08/12/2009   Method used:   Electronically to        [facility]. [phone]* (retail)       [address]       [place], [place]  [address]       Ph: [phone]       Fax: [phone]   RxID:   [id number]

## 2010-07-16 NOTE — Miscellaneous (Signed)
Summary: PT Re Eval/[facility]  PT Re Eval/[facility]   Imported By: Lanelle Bal 08/22/2009 08:26:57  _____________________________________________________________________  External Attachment:    Type:   Image     Comment:   External Document

## 2010-07-16 NOTE — Assessment & Plan Note (Signed)
Summary: 6 mth fu/ns/kdc   Vital Signs:  Patient profile:   55 year old female Weight:      199 pounds Pulse rate:   86 / minute Pulse rhythm:   regular BP sitting:   122 / 76  (left arm) Cuff size:   regular  Vitals Entered By: Army Fossa CMA (December 04, 2009 12:54 PM) CC: Pt here to follow up on meds- discuss cutting back on some.   History of Present Illness:  Hypertension follow-up      This is a 55 year old woman who presents for Hypertension follow-up.  Pt c/o choking feeling at night that causes a dry cough for 2-3 months.   .  The patient denies lightheadedness, urinary frequency, headaches, edema, impotence, rash, and fatigue.  The patient denies the following associated symptoms: chest pain, chest pressure, exercise intolerance, dyspnea, palpitations, syncope, leg edema, and pedal edema.  Compliance with medications (by patient report) has been near 100%.  The patient reports that dietary compliance has been good.  Adjunctive measures currently used by the patient include salt restriction.    Pt still struggling with intense fatigue and pain--- pt is supposed to go back to work in August but just doesn't think she can.  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    Current Medications (verified): 1)  Norvasc 5 Mg Tabs (Amlodipine Besylate) .Marland Kitchen.. 1 By Mouth Once Daily 2)  Robinul-Forte 2 Mg Tabs (Glycopyrrolate) .... Take 1 Qab Two Times A Day As Needed For Urgency 3)  Cymbalta 30 Mg Cpep (Duloxetine Hcl) .Marland Kitchen.. 1 By Mouth Once Daily 4)  Vitamin D 1000 Unit Tabs (Cholecalciferol) 5)  Celebrex 200 Mg Caps (Celecoxib) .Marland Kitchen.. 1 By Mouth Once Daily  Allergies: 1)  ! Pcn 2)  ! Paregoric (Paregoric) 3)  ! * Novacain  Past History:  Past medical, surgical, family and social histories (including risk factors) reviewed for relevance to current acute and chronic problems.  Past Medical History: Reviewed history from  03/18/2009 and no changes required. Headache Hypertension Adenomatous Colon Polyps Diverticulosis Hyperlipidemia Irritable Bowel Syndrome C-Diff  Past Surgical History: Reviewed history from 01/10/2007 and no changes required. Hysterectomy-2002  Family History: Reviewed history from 03/10/2009 and no changes required. MOTHER-DECEASED FATHER-DECEASED 1 SISTER-LIVING Family History Hypertension-MOTHER, PGM Family History Diabetes 1st degree relative-MGM Family History Depression-MOTHER HEART-MOTHER(HEART ATTACK) Family History of Breast Cancer:Sister Family History of Uterine Cancer:Mother  Social History: Reviewed history from 03/10/2009 and no changes required. 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  Teeth in good repair. Neck:  No deformities, masses, or tenderness noted. Lungs:  Normal respiratory effort, chest expands symmetrically. Lungs are clear to auscultation, no crackles or wheezes. Heart:  normal rate and no murmur.   Psych:  Cognition and judgment appear intact. Alert and cooperative with normal attention span and concentration. No apparent delusions, illusions, hallucinations   Impression & Recommendations:  Problem # 1:  COUGH (ICD-786.2) ? zestoretic  change to norvasc  Problem # 2:  HYPERTENSION (ICD-401.9)  Her updated medication list for this problem includes:    Norvasc 5 Mg Tabs (Amlodipine besylate) .Marland KitchenMarland KitchenMarland KitchenMarland Kitchen  1 by mouth once daily  BP today: 122/76 Prior BP: 124/80 (07/18/2009)  Labs Reviewed: K+: 4.4 (02/28/2009) Creat: : 0.9 (02/28/2009)   Chol: 250 (12/30/2008)   HDL: 49.30  (12/30/2008)   LDL: 120 (07/13/2007)   TG: 162.0 (12/30/2008)  Problem # 3:  DEPRESSION (ICD-311)  Her updated medication list for this problem includes:    Cymbalta 30 Mg Cpep (Duloxetine hcl) .Marland Kitchen... 1 by mouth once daily  Problem # 4:  FIBROMYALGIA (ICD-729.1)  Her updated medication list for this problem includes:    Celebrex 200 Mg Caps (Celecoxib) .Marland Kitchen... 1 by mouth once daily  Problem # 5:  CONSTIPATION (ICD-564.00) pt nervous about taking anything because of her hx diarrhea---I advised she try align and or miralax but she will  discuss this with GI  Discussed dietary fiber measures and increased water intake.   Complete Medication List: 1)  Norvasc 5 Mg Tabs (Amlodipine besylate) .Marland Kitchen.. 1 by mouth once daily 2)  Robinul-forte 2 Mg Tabs (Glycopyrrolate) .... Take 1 qab two times a day as needed for urgency 3)  Cymbalta 30 Mg Cpep (Duloxetine hcl) .Marland Kitchen.. 1 by mouth once daily 4)  Vitamin D 1000 Unit Tabs (Cholecalciferol) 5)  Celebrex 200 Mg Caps (Celecoxib) .Marland Kitchen.. 1 by mouth once daily  Patient Instructions: 1)  rto physical--due in july Prescriptions: NORVASC 5 MG TABS (AMLODIPINE BESYLATE) 1 by mouth once daily  #30 x 2   Entered and Authorized by:   Loreen Freud DO   Signed by:   Loreen Freud DO on 12/04/2009   Method used:   Electronically to        [facility]. [phone]* (retail)       [address]       [place], [place]  [address]       Ph: [phone]       Fax: [phone]   RxID:   [id number]

## 2010-07-16 NOTE — Progress Notes (Signed)
Summary: Labs results  Phone Note Outgoing Call   Call placed by: Jeremy Johann CMA,  January 14, 2010 3:56 PM Details for Reason: Ideally your LDL (bad cholesterol) should be <_100___, your HDL (good cholesterol) should be >__40_ and your triglycerides should be< 150.  Diet and exercise will increase HDL and decrease the LDL and triglycerides. Read Dr. Danice Goltz book--Eat Drink and Be Healthy.  We need to start medication.  This has increased even more from last year.   Lipitor 10 mg #30, 1 refills  1 by mouth at bedtime.    We will recheck labs in __3_ months.  lipid, hep  272.4 Summary of Call: left message to call office .....Marland KitchenMarland KitchenFelecia Deloach CMA  January 14, 2010 3:56 PM   Follow-up for Phone Call        Patient notified, and mailed for patient records. Follow-up by: Lucious Groves CMA,  January 16, 2010 9:21 AM    New/Updated Medications: LIPITOR 10 MG TABS (ATORVASTATIN CALCIUM) 1 by mouth qhs Prescriptions: LIPITOR 10 MG TABS (ATORVASTATIN CALCIUM) 1 by mouth qhs  #30 x 1   Entered by:   Lucious Groves CMA   Authorized by:   Loreen Freud DO   Signed by:   Lucious Groves CMA on 01/16/2010   Method used:   Electronically to        [facility]. [phone]* (retail)       [address]       [place], [place]  [address]       Ph: [phone]       Fax: [phone]   RxID:   [id number]

## 2010-07-16 NOTE — Assessment & Plan Note (Signed)
Summary: temp/cough/cbs   Vital Signs:  Patient profile:   55 year old female Weight:      203.2 pounds O2 Sat:      93 % on Room air Temp:     99.9 degrees F oral Pulse rate:   100 / minute Pulse rhythm:   regular BP sitting:   110 / 68  (left arm) Cuff size:   large  Vitals Entered By: Almeta Monas CMA Duncan Dull) (May 18, 2010 11:39 AM)  O2 Flow:  Room air CC: x2days c/o fever, body aches and chills and some sinus pressure, Cough   History of Present Illness:  Cough      This is a 55 year old woman who presents with Cough.  The symptoms began 4 days ago.  The patient reports productive cough, shortness of breath, wheezing, and fever, but denies non-productive cough, pleuritic chest pain, exertional dyspnea, hemoptysis, and malaise.  The patient denies the following symptoms: cold/URI symptoms, sore throat, nasal congestion, chronic rhinitis, weight loss, acid reflux symptoms, and peripheral edema.  The cough is worse with activity.  Ineffective prior treatments have included OTC cough medication.    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  1 By Mouth Once Daily 2)  Robinul-Forte 2 Mg Tabs (Glycopyrrolate) .... Take 1 Qab Two Times A Day As Needed For Urgency 3)  Vitamin D 1000 Unit Tabs (Cholecalciferol) 4)  Celebrex 200 Mg Caps (Celecoxib) .Marland Kitchen.. 1 By Mouth Once Daily 5)  Cymbalta 60 Mg Cpep (Duloxetine Hcl) .... Take 1 Tab  Daily 6)  Lipitor 10 Mg Tabs (Atorvastatin Calcium) .Marland Kitchen.. 1 By Mouth Qhs  Current Medications (verified): 1)  Norvasc 5 Mg Tabs (Amlodipine Besylate) .Marland Kitchen.. 1 By Mouth Once Daily 2)  Robinul-Forte 2 Mg Tabs (Glycopyrrolate) .... Take 1 Qab Two Times A Day As Needed For Urgency 3)  Vitamin D 1000 Unit Tabs (Cholecalciferol) 4)  Celebrex 200 Mg Caps (Celecoxib) .Marland Kitchen.. 1 By Mouth Once Daily 5)  Cymbalta 60 Mg Cpep (Duloxetine Hcl) .... Take 1 Tab  Daily 6)  Lipitor 10 Mg Tabs (Atorvastatin Calcium) .Marland Kitchen.. 1 By Mouth Qhs 7)  Biaxin Xl 500 Mg Xr24h-Tab (Clarithromycin) .... 2 By Mouth Once Daily  Allergies (verified): 1)  ! Pcn 2)  ! Paregoric (Paregoric) 3)  ! * Novacain  Past History:  Past Medical History: Last updated: 03/18/2009 Headache Hypertension Adenomatous Colon Polyps Diverticulosis Hyperlipidemia Irritable Bowel Syndrome C-Diff  Past Surgical History: Last updated: 01/10/2007 Hysterectomy-2002  Family History: Last updated: Apr 08, 2009 MOTHER-DECEASED FATHER-DECEASED 1 SISTER-LIVING Family History Hypertension-MOTHER, PGM Family History Diabetes 1st degree relative-MGM Family History Depression-MOTHER HEART-MOTHER(HEART ATTACK) Family History of  Breast Cancer:Sister Family History of Uterine Cancer:Mother  Social History: Last updated: 03/10/2009 Occupation:teacher, 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 MOTHER-DECEASED FATHER-DECEASED 1 SISTER-LIVING Family History Hypertension-MOTHER, PGM Family History Diabetes 1st degree relative-MGM Family History Depression-MOTHER HEART-MOTHER(HEART ATTACK) Family History of Breast Cancer:Sister Family History of Uterine Cancer:Mother  Social History: Reviewed history from 03/10/2009 and no changes required. 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  Otoscopic examination reveals clear canals, tympanic membranes are intact bilaterally without bulging, retraction, inflammation or discharge. Hearing is grossly normal bilaterally. Nose:  External nasal examination shows no deformity or inflammation. Nasal mucosa are pink and moist without lesions or exudates. Mouth:  Oral mucosa and oropharynx without lesions or exudates.  Teeth in good repair. Neck:  No deformities, masses, or tenderness noted. Lungs:  normal respiratory effort, no intercostal retractions, and no accessory muscle use.   + rhonci ant/ with wheezing post bS normal Heart:  normal rate and no murmur.   Psych:  Cognition and judgment appear intact. Alert and cooperative with normal attention span and concentration. No apparent delusions, illusions, hallucinations   Impression & Recommendations:  Problem # 1:  BRONCHITIS- ACUTE (ICD-466.0)  Her updated medication list for this problem includes:    Biaxin Xl 500 Mg Xr24h-tab (Clarithromycin) .Marland Kitchen... 2 by mouth  once daily  Take antibiotics and other medications as directed. Encouraged to push clear liquids, get enough rest, and take acetaminophen as needed. To be seen in 5-7 days if no improvement, sooner if worse.  Orders: T-2 View CXR (71020TC)  Complete Medication List: 1)  Norvasc 5 Mg Tabs (Amlodipine besylate) .Marland Kitchen.. 1 by mouth once daily 2)  Robinul-forte 2 Mg Tabs (Glycopyrrolate) .... Take 1 qab two times a day as needed for urgency 3)  Vitamin D 1000 Unit Tabs (Cholecalciferol) 4)  Celebrex 200 Mg Caps (Celecoxib) .Marland Kitchen.. 1 by mouth once daily 5)  Cymbalta 60 Mg Cpep (Duloxetine hcl) .... Take 1 tab  daily 6)  Lipitor 10 Mg Tabs (Atorvastatin calcium) .Marland Kitchen.. 1 by mouth qhs 7)  Biaxin Xl 500 Mg Xr24h-tab (Clarithromycin) .... 2 by mouth once daily  Patient Instructions: 1)  Acute Bronchitis symptoms for less then 10 days are not  helped by antibiotics. Take over the counter cough medications. Call if no improvement in 5-7 days, sooner if increasing cough, fever, or new symptoms ( shortness of breath, chest pain) .  2)  mucinex for cough Prescriptions: BIAXIN XL 500 MG XR24H-TAB (CLARITHROMYCIN) 2 by mouth once daily  #14 x 0   Entered and Authorized by:   Loreen Freud DO   Signed by:   Loreen Freud DO on 05/18/2010   Method used:   Electronically to        [facility]. [phone]* (retail)       [address]       [place]  [place], [place]  [address]       Ph: [phone]       Fax: [phone]   RxID:   [id number]    Orders Added: 1)  T-2 View CXR [71020TC] 2)  Est. Patient Level III [66440]

## 2010-07-16 NOTE — Progress Notes (Signed)
Summary: Judithann Sauger REVIEW PAPERWORK COMPLETED  Phone Note Call from Patient   Caller: Patient Summary of Call: PAPERWORK FOR DISABILITY ELIGIBILITY REVIEW COMPLETED   FELICIA SPOKE TO PATIENT ON 8/8 TO REPORT PAPERWORK WAS READY UP FRONT FOR PICKUP  COPY WAS SENT TO BE SCANNED---WAS CHARGED SIMPLE FORM = $20.00 Initial call taken by: Jerolyn Shin,  January 19, 2010 5:29 PM

## 2010-07-16 NOTE — Assessment & Plan Note (Signed)
Summary: Follow up on disablity/drb   Vital Signs:  Patient profile:   55 year old female Weight:      198 pounds Temp:     97.9 degrees F oral Pulse rate:   88 / minute Pulse rhythm:   regular BP sitting:   124 / 80  (left arm) Cuff size:   regular  Vitals Entered By: Army Fossa CMA (July 18, 2009 9:35 AM) CC: Discuss fibromyalgia and medications   History of Present Illness: Pt is having a lot of pain with the fibromyalgia and cymbalta has stopped working.  Pt states she always hurts somewhere but to varying degress.  She has few days which pain is severe but the fatigue is the major concern.  pt is not sleeping well at night because of pain.  She added a mattress topper which helps but she is still waking up 2-3 times at night.    Pt is also c/o constipation.     Current Medications (verified): 1)  Zyrtec Allergy 10 Mg Tabs (Cetirizine Hcl) .... Take One Tablet Daily. 2)  Relpax 20 Mg Tabs (Eletriptan Hydrobromide) .... Use As Directed 3)  Levsin/sl 0.125 Mg Subl (Hyoscyamine Sulfate) .Marland Kitchen.. 1-2 Sl Q4h As Needed 4)  Zestoretic 20-25 Mg Tabs (Lisinopril-Hydrochlorothiazide) .Marland Kitchen.. 1 By Mouth Once Daily 5)  Imodium Advanced 2-125 Mg Tabs (Loperamide-Simethicone) .... As Needed 6)  Robinul-Forte 2 Mg Tabs (Glycopyrrolate) .... Take 1 Qab Two Times A Day As Needed For Urgency 7)  Cymbalta 60 Mg Cpep (Duloxetine Hcl) .... 2 By Mouth Once Daily 8)  Vitamin D 1000 Unit Tabs (Cholecalciferol)  Allergies: 1)  ! Pcn 2)  ! Paregoric (Paregoric) 3)  ! 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  Past Medical History: Reviewed history from 03/18/2009 and no changes required. Headache Hypertension Adenomatous Colon Polyps Diverticulosis Hyperlipidemia Irritable Bowel Syndrome C-Diff  Past Surgical History: Reviewed history from 01/10/2007 and no changes required. Hysterectomy-2002  Family History: Reviewed history from 21-Mar-2009 and no changes required. MOTHER-DECEASED FATHER-DECEASED 1 SISTER-LIVING Family History Hypertension-MOTHER, PGM Family History Diabetes 1st degree relative-MGM Family History Depression-MOTHER HEART-MOTHER(HEART ATTACK) Family History of Breast Cancer:Sister Family History of Uterine Cancer:Mother  Social History: Reviewed history from March 21, 2009 and no changes required. 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     Impression & Recommendations:  Problem # 1:  FIBROMYALGIA (ICD-729.1) cont PT con't meds disability papers filled out for 1 month  Problem # 2:  DEPRESSION (ICD-311)  Her updated medication list for this problem includes:    Cymbalta 60 Mg Cpep (  Duloxetine hcl)  .Marland Kitchen... 2 by mouth once daily  Problem # 3:  PARESTHESIA (ICD-782.0) per neuro need to get records  Complete Medication List: 1)  Zyrtec Allergy 10 Mg Tabs (Cetirizine hcl) .... Take one tablet daily. 2)  Relpax 20 Mg Tabs (Eletriptan hydrobromide) .... Use as directed 3)  Levsin/sl 0.125 Mg Subl (Hyoscyamine sulfate) .Marland Kitchen.. 1-2 sl q4h as needed 4)  Zestoretic 20-25 Mg Tabs (Lisinopril-hydrochlorothiazide) .Marland Kitchen.. 1 by mouth once daily 5)  Imodium Advanced 2-125 Mg Tabs (Loperamide-simethicone) .... As needed 6)  Robinul-forte 2 Mg Tabs (Glycopyrrolate) .... Take 1 qab two times a day as needed for urgency 7)  Cymbalta 60 Mg Cpep (Duloxetine hcl) .... 2 by mouth once daily 8)  Vitamin D 1000 Unit Tabs (Cholecalciferol) Prescriptions: CYMBALTA 60 MG CPEP (DULOXETINE HCL) 2 by mouth once daily  #60 x 5   Entered and Authorized by:   Loreen Freud DO   Signed by:   Loreen Freud DO on 07/18/2009   Method used:   Historical   RxID:   [id number]

## 2010-07-16 NOTE — Miscellaneous (Signed)
Summary: PT Re Eval/[facility]  PT Re Eval/[facility]   Imported By: Lanelle Bal 09/19/2009 13:42:22  _____________________________________________________________________  External Attachment:    Type:   Image     Comment:   External Document

## 2010-09-07 ENCOUNTER — Other Ambulatory Visit: Payer: Self-pay | Admitting: Family Medicine

## 2010-09-07 NOTE — Telephone Encounter (Signed)
CPX 01/2010, Repeat labs were due 11/11, last seen 05/18/10 please advise     KP

## 2010-09-07 NOTE — Telephone Encounter (Signed)
Refill x1 only---call or send letter to pt that fasting ov needed.

## 2010-09-10 ENCOUNTER — Telehealth: Payer: Self-pay | Admitting: *Deleted

## 2010-09-10 NOTE — Telephone Encounter (Signed)
Awaiting fax, case #[id number].Felecia Georgie Eduardo CMA

## 2010-09-10 NOTE — Telephone Encounter (Signed)
PA faxed back awaiting response.Felecia Lucianne Smestad CMA

## 2010-09-10 NOTE — Telephone Encounter (Signed)
Prior Auth approved 08-20-10 until 09-10-11, pharmacy notified, approval letter scan to chart.

## 2010-09-23 ENCOUNTER — Encounter: Payer: Self-pay | Admitting: Family Medicine

## 2010-09-29 ENCOUNTER — Ambulatory Visit (INDEPENDENT_AMBULATORY_CARE_PROVIDER_SITE_OTHER): Payer: BLUE CROSS/BLUE SHIELD | Admitting: Family Medicine

## 2010-09-29 ENCOUNTER — Encounter: Payer: Self-pay | Admitting: Family Medicine

## 2010-09-29 DIAGNOSIS — M2550 Pain in unspecified joint: Secondary | ICD-10-CM

## 2010-09-29 DIAGNOSIS — I10 Essential (primary) hypertension: Secondary | ICD-10-CM

## 2010-09-29 DIAGNOSIS — M797 Fibromyalgia: Secondary | ICD-10-CM

## 2010-09-29 DIAGNOSIS — G894 Chronic pain syndrome: Secondary | ICD-10-CM | POA: Insufficient documentation

## 2010-09-29 DIAGNOSIS — IMO0001 Reserved for inherently not codable concepts without codable children: Secondary | ICD-10-CM

## 2010-09-29 LAB — HEPATIC FUNCTION PANEL
ALT: 20 U/L (ref 0–35)
AST: 21 U/L (ref 0–37)
Albumin: 4.1 g/dL (ref 3.5–5.2)
Total Protein: 6.9 g/dL (ref 6.0–8.3)

## 2010-09-29 LAB — CBC WITH DIFFERENTIAL/PLATELET
Basophils Relative: 0.5 % (ref 0.0–3.0)
Eosinophils Absolute: 0.4 10*3/uL (ref 0.0–0.7)
Eosinophils Relative: 3.5 % (ref 0.0–5.0)
HCT: 41.5 % (ref 36.0–46.0)
Hemoglobin: 14.1 g/dL (ref 12.0–15.0)
MCHC: 33.8 g/dL (ref 30.0–36.0)
MCV: 88.2 fl (ref 78.0–100.0)
Monocytes Absolute: 0.7 10*3/uL (ref 0.1–1.0)
Neutro Abs: 5.8 10*3/uL (ref 1.4–7.7)
RBC: 4.71 Mil/uL (ref 3.87–5.11)
WBC: 10.6 10*3/uL — ABNORMAL HIGH (ref 4.5–10.5)

## 2010-09-29 LAB — LIPID PANEL: Cholesterol: 173 mg/dL (ref 0–200)

## 2010-09-29 LAB — BASIC METABOLIC PANEL
BUN: 9 mg/dL (ref 6–23)
CO2: 29 mEq/L (ref 19–32)
Chloride: 106 mEq/L (ref 96–112)
Creatinine, Ser: 0.7 mg/dL (ref 0.4–1.2)
Glucose, Bld: 79 mg/dL (ref 70–99)

## 2010-09-29 LAB — TSH: TSH: 1.46 u[IU]/mL (ref 0.35–5.50)

## 2010-09-29 LAB — SEDIMENTATION RATE: Sed Rate: 12 mm/hr (ref 0–22)

## 2010-09-29 NOTE — Assessment & Plan Note (Signed)
Stable Check labs  con't meds 

## 2010-09-29 NOTE — Assessment & Plan Note (Signed)
New Refer to rheum Increase celebrex to bid until referral made

## 2010-09-29 NOTE — Progress Notes (Signed)
  Subjective:    Patient ID: Veronica Solomon, female    DOB: [birth year], 55 y.o.   MRN: [id number]  HPI Pt here for f/u and c/o joint pains which are new.  Mostly elbows and knees.  celebrex is not helping at all.     Review of Systems     Objective:   Physical Exam        Assessment & Plan:   Subjective:    Patient here for follow-up of elevated blood pressure.  She is exercising and is adherent to a low-salt diet.  Blood pressure is well controlled at home. Cardiac symptoms: none. Patient denies: chest pain, chest pressure/discomfort, dyspnea, exertional chest pressure/discomfort and palpitations. Cardiovascular risk factors: hypertension. Use of agents associated with hypertension: none. History of target organ damage: none.  The following portions of the patient's history were reviewed and updated as appropriate: allergies, current medications, past family history, past medical history, past social history, past surgical history and problem list.  Pt also c/o joint pains that are new and have been getting worse.  No injuries.  Pain is worsening and celebrex is no longer working.    Review of Systems Pertinent items are noted in HPI.     Objective:    BP 120/78  Pulse 68  Wt 215 lb 3.2 oz (97.614 kg) General appearance: alert, cooperative, appears stated age and no distress Neck: no adenopathy, no carotid bruit, no JVD, supple, symmetrical, trachea midline and thyroid not enlarged, symmetric, no tenderness/mass/nodules Lungs: clear to auscultation bilaterally Heart: regular rate and rhythm, S1, S2 normal, no murmur, click, rub or gallop Extremities: extremities normal, atraumatic, no cyanosis or edema Neurologic: Alert and oriented X 3, normal strength and tone. Normal symmetric reflexes. Normal coordination and gait Motor:grossly normal Gait: Normal    Assessment:    Hypertension, normal blood pressure  . Evidence of target organ damage: none.    joint  pains--new Plan:    Medication: no change.  Check labs  Refer to rheum prn

## 2010-09-29 NOTE — Patient Instructions (Signed)
Arthralgia (Pain In A Joint) Your caregiver has diagnosed you as suffering from an arthralgia. Arthralgia simply means pain in a joint. This can come from many reasons including:  Bruising the joint which causes soreness (inflammation) in the joint   From the wear and tear on the joints which occur as we grow older (osteoarthritis)   Overusing the joint,   Various forms of arthritis   Infections of the joint  Regardless of the cause of pain in your joint, most of these different pains respond to anti-inflammatory drugs and rest. The exception to this is when a joint is infected, and these cases are treated with medications which kill germs (antibiotics) if it is an infection caused by a germ (bacterial infection). HOME CARE INSTRUCTIONS  Rest the injured area for several days. Then slowly start using the joint as directed by your caregiver and as the pain allows. Crutches as directed may be useful if the ankles, knees or hips are involved. If the knee was splinted or casted, continue use and care as directed. If an ace bandage (stretchy, elastic wrapping bandage) has been applied today, it should be removed and re-applied every 3 to 4 hours. It should not be applied tightly, but firmly enough to keep swelling down. Watch toes and feet for swelling, bluish discoloration, coldness, numbness or excessive pain. If any of these problems (symptoms) occur, remove the ace bandage and re-apply more loosely. If these symptoms persist, contact your caregiver or return to this location.   For the first 24 hours, keep the injured extremity elevated on 2 pillows while lying down.   Apply ice for 15-20 minutes to the sore joint every couple hours while awake for the first half day, then 1-2 times per day for the first 48 hours. Put the ice in a plastic bag and place a towel between the bag of ice and your skin.   Wear any splinting, casting, elastic bandage applications, or slings as instructed.   Only take  over-the-counter or prescription medicines for pain, discomfort, or fever as directed by your caregiver. Do not use aspirin immediately after the injury unless instructed by your physician. Aspirin can cause increased bleeding and bruising of the tissues.   If you were given crutches, continue to use them as instructed and do not resume weight bearing on the sore joint until instructed.  Persistent pain and inability to use the sore joint as directed for more than 2 to 3 days are warning signs indicating that you should see a caregiver for a follow-up visit as soon as possible. Initially, a hairline fracture (break in bone) may not be evident on x-rays. Persistent pain and swelling indicate that further evaluation, non-weight bearing or use of joint (use of crutches and or slings as instructed), and/or further x-rays are indicated. X-rays may sometimes not show a small fracture until a week or ten days later. Make a follow-up appointment with your own caregiver or one to whom we have referred you. A radiologist (specialist in reading x-rays) may re-read your x-rays. Make sure you know how you are to obtain your x-ray results. Do not assume everything is normal if you do not hear from [place]. SEEK MEDICAL CARE IF: Bruising, swelling, or pain increases. SEEK IMMEDIATE MEDICAL CARE IF:  Your fingers or toes are numb or blue.   The pain is not responding to medications and continues to stay the same or get worse.   The pain in your joint becomes severe.  You develop a fever over 100.4.   It becomes impossible to move or use the joint.  MAKE SURE YOU:   Understand these instructions.   Will watch your condition.   Will get help right away if you are not doing well or get worse.  Document Released: 05/31/2005 Document Re-Released: 06/22/2009 [facility] Patient Information 2011 [place], [place].

## 2010-09-30 LAB — ANA: Anti Nuclear Antibody(ANA): NEGATIVE

## 2010-09-30 LAB — RHEUMATOID FACTOR: Rheumatoid fact SerPl-aCnc: 11 [IU]/mL

## 2010-10-01 ENCOUNTER — Telehealth: Payer: Self-pay | Admitting: *Deleted

## 2010-10-01 NOTE — Telephone Encounter (Addendum)
Left message to call office   Message copied by Candie Echevaria on Thu Oct 01, 2010  9:27 AM ------      Message from: Loreen Freud      Created: Tue Sep 29, 2010  6:12 PM       + elevated WBC---any fevers , congestion etc

## 2010-10-01 NOTE — Telephone Encounter (Signed)
Left message to call office

## 2010-10-01 NOTE — Telephone Encounter (Signed)
Pt aware of lab results. Pt denies any symptoms or recent illness that could have caused elevation in WBC

## 2010-10-01 NOTE — Telephone Encounter (Signed)
Recheck cbc in 3-4 weeks   cbcd 288.8

## 2010-10-02 NOTE — Telephone Encounter (Signed)
Pt aware labs scheduled.

## 2010-10-08 ENCOUNTER — Other Ambulatory Visit: Payer: Self-pay | Admitting: Family Medicine

## 2010-10-15 ENCOUNTER — Other Ambulatory Visit (INDEPENDENT_AMBULATORY_CARE_PROVIDER_SITE_OTHER): Payer: BLUE CROSS/BLUE SHIELD

## 2010-10-15 DIAGNOSIS — D7289 Other specified disorders of white blood cells: Secondary | ICD-10-CM

## 2010-10-15 LAB — CBC WITH DIFFERENTIAL/PLATELET
Basophils Absolute: 0 10*3/uL (ref 0.0–0.1)
Lymphocytes Relative: 40.6 % (ref 12.0–46.0)
Lymphs Abs: 4.8 10*3/uL — ABNORMAL HIGH (ref 0.7–4.0)
Monocytes Relative: 7.4 % (ref 3.0–12.0)
Platelets: 273 10*3/uL (ref 150.0–400.0)
RDW: 12.9 % (ref 11.5–14.6)

## 2010-10-23 ENCOUNTER — Telehealth: Payer: Self-pay | Admitting: *Deleted

## 2010-10-23 NOTE — Telephone Encounter (Signed)
Discuss with patient  

## 2010-10-23 NOTE — Telephone Encounter (Addendum)
Left message to call office   Message copied by Candie Echevaria on Fri Oct 23, 2010 12:06 PM ------      Message from: Loreen Freud      Created: Thu Oct 22, 2010 10:05 PM       Only wbc elevated---rest of cbc normal=====if no change in symptoms---recheck 3 months   288.8  cbcd

## 2011-01-18 ENCOUNTER — Telehealth: Payer: Self-pay | Admitting: Family Medicine

## 2011-01-18 NOTE — Telephone Encounter (Signed)
Discussed with patient and she stated she needed paperwork complete for work...appointment scheduled to discuss   KP

## 2011-01-19 ENCOUNTER — Encounter: Payer: Self-pay | Admitting: Family Medicine

## 2011-01-19 ENCOUNTER — Ambulatory Visit (INDEPENDENT_AMBULATORY_CARE_PROVIDER_SITE_OTHER): Payer: BC Managed Care – PPO | Admitting: Family Medicine

## 2011-01-19 DIAGNOSIS — IMO0001 Reserved for inherently not codable concepts without codable children: Secondary | ICD-10-CM

## 2011-01-19 DIAGNOSIS — M797 Fibromyalgia: Secondary | ICD-10-CM

## 2011-01-19 DIAGNOSIS — G43909 Migraine, unspecified, not intractable, without status migrainosus: Secondary | ICD-10-CM

## 2011-01-19 NOTE — Progress Notes (Signed)
  Subjective:    Patient ID: Veronica Solomon, female    DOB: [birth year], 55 y.o.   MRN: [id number]  HPI Pt is here to have disability paperwork filled out.  No change in symptoms.   See last ov and ov with rheum.   Review of Systems As above    Objective:   Physical Exam  Constitutional: She appears well-developed and well-nourished.  HENT:  Head: Normocephalic and atraumatic.  Neck: Normal range of motion. Neck supple.  Cardiovascular: Normal rate and regular rhythm.   Pulmonary/Chest: Effort normal and breath sounds normal.  Abdominal: Soft. Bowel sounds are normal.  Musculoskeletal:       Mult tender points  Skin: Skin is warm and dry.  Psychiatric: She has a normal mood and affect.          Assessment & Plan:

## 2011-01-19 NOTE — Patient Instructions (Signed)

## 2011-02-11 ENCOUNTER — Other Ambulatory Visit: Payer: Self-pay | Admitting: Family Medicine

## 2011-04-15 ENCOUNTER — Other Ambulatory Visit: Payer: Self-pay | Admitting: Family Medicine

## 2011-05-15 ENCOUNTER — Other Ambulatory Visit: Payer: Self-pay | Admitting: Family Medicine

## 2011-05-17 NOTE — Telephone Encounter (Signed)
Celebrex Last OV 01-19-11, last filled 02-11-11 #30, last OV 10-08-10

## 2011-05-18 NOTE — Telephone Encounter (Signed)
Rx sent to pharmacy   

## 2011-07-16 ENCOUNTER — Other Ambulatory Visit: Payer: Self-pay | Admitting: Family Medicine

## 2011-08-03 ENCOUNTER — Other Ambulatory Visit: Payer: Self-pay

## 2011-08-03 MED ORDER — CELECOXIB 200 MG PO CAPS: 200.0000 mg | ORAL_CAPSULE | Freq: Every day | ORAL | Status: DC

## 2011-08-03 NOTE — Telephone Encounter (Signed)
Case ID # [id number]   Last seen 01/19/11 and celebrex filled 12/1/123 30. Please advise   KP

## 2011-08-16 ENCOUNTER — Other Ambulatory Visit: Payer: Self-pay | Admitting: Family Medicine

## 2011-08-23 ENCOUNTER — Ambulatory Visit (INDEPENDENT_AMBULATORY_CARE_PROVIDER_SITE_OTHER): Payer: BC Managed Care – PPO | Admitting: Family Medicine

## 2011-08-23 ENCOUNTER — Telehealth: Payer: Self-pay

## 2011-08-23 VITALS — BP 121/82 | HR 84 | Temp 98.0°F | Resp 16 | Ht 67.75 in | Wt 213.2 lb

## 2011-08-23 DIAGNOSIS — H1045 Other chronic allergic conjunctivitis: Secondary | ICD-10-CM

## 2011-08-23 DIAGNOSIS — H109 Unspecified conjunctivitis: Secondary | ICD-10-CM

## 2011-08-23 DIAGNOSIS — J40 Bronchitis, not specified as acute or chronic: Secondary | ICD-10-CM

## 2011-08-23 MED ORDER — TOBRAMYCIN 0.3 % OP SOLN: 1.0000 [drp] | OPHTHALMIC | Status: AC

## 2011-08-23 MED ORDER — AZITHROMYCIN 250 MG PO TABS: ORAL_TABLET | ORAL | Status: AC

## 2011-08-23 NOTE — Patient Instructions (Signed)
Conjunctivitis Conjunctivitis is commonly called "pink eye." Conjunctivitis can be caused by bacterial or viral infection, allergies, or injuries. There is usually redness of the lining of the eye, itching, discomfort, and sometimes discharge. There may be deposits of matter along the eyelids. A viral infection usually causes a watery discharge, while a bacterial infection causes a yellowish, thick discharge. Pink eye is very contagious and spreads by direct contact. You may be given antibiotic eyedrops as part of your treatment. Before using your eye medicine, remove all drainage from the eye by washing gently with warm water and cotton balls. Continue to use the medication until you have awakened 2 mornings in a row without discharge from the eye. Do not rub your eye. This increases the irritation and helps spread infection. Use separate towels from other household members. Wash your hands with soap and water before and after touching your eyes. Use cold compresses to reduce pain and sunglasses to relieve irritation from light. Do not wear contact lenses or wear eye makeup until the infection is gone. SEEK MEDICAL CARE IF:   Your symptoms are not better after 3 days of treatment.   You have increased pain or trouble seeing.   The outer eyelids become very red or swollen.  Document Released: 07/08/2004 Document Revised: 05/20/2011 Document Reviewed: 05/31/2005 [employer] Patient Information 2012 [employer]. 

## 2011-08-23 NOTE — Progress Notes (Signed)
56-year-old disabled woman from fibromyalgia comes in today with about 4 days of cough which has been progressive and 3 days of  redness in the left eye with mattering. Both symptoms have been increasing worse. Patient has no history of asthma. She has associated symptoms of malaise and nasal congestion.  Objective: HEENT-unremarkable except for mucopurulent nasal discharge.  Eyes: Normal funduscopic-left eye is injected with Cristie lid margins.  Chest: Diffuse wheezes and rhonchi  Heart regular no murmur or rub  Extremities: No edema  Assessment: Acute bronchitis and left-sided conjunctivitis  Plan Tobrex and Z-Pak

## 2011-08-23 NOTE — Telephone Encounter (Signed)
[facility] Triage Call Report Triage Record Num: [id number] Operator: Freddie Breech Patient Name: Veronica Solomon Call Date & Time: 08/23/2011 1:08:48PM Patient Phone: [phone] PCP: Lelon Perla Patient Gender: Female PCP Fax : [phone] Patient DOB: 1956/05/05 Practice Name: [facility] Reason for Call: Caller: [name]; PCP: Lelon Perla.; CB#: [phone]; ; ; Call regarding Cough/Congestion; Pt is calling for an appt for cough and sinus congestion. Emergent sx r/o. Appts are full today. Pt decides to go to [facility]. Ardyth Gal Protocol. Protocol(s) Used: Information Only Call; No Symptom Triage (Adult) Recommended Outcome per Protocol: Call Provider When Office is Open Override Outcome if Used in Protocol: See Provider within 4 hours RN Reason for Override Outcome: Per Caller Request. Reason for Outcome: Requesting regular office appointment Care Advice:

## 2011-08-23 NOTE — Telephone Encounter (Signed)
Patient was seen today at [facility]-- call resolved.     KP

## 2011-09-15 ENCOUNTER — Other Ambulatory Visit: Payer: Self-pay | Admitting: Family Medicine

## 2011-10-18 ENCOUNTER — Other Ambulatory Visit: Payer: Self-pay | Admitting: Family Medicine

## 2011-10-21 ENCOUNTER — Telehealth: Payer: Self-pay | Admitting: Family Medicine

## 2011-10-21 NOTE — Telephone Encounter (Signed)
PRIOR AUTH FOR CELEBREX CAPSULE HAS BEEN APPROVED EFFECTIVE 09/28/2011-10/18/2012.

## 2011-11-18 ENCOUNTER — Other Ambulatory Visit: Payer: Self-pay | Admitting: Family Medicine

## 2011-11-25 ENCOUNTER — Encounter: Payer: Self-pay | Admitting: Family Medicine

## 2011-11-25 ENCOUNTER — Ambulatory Visit (INDEPENDENT_AMBULATORY_CARE_PROVIDER_SITE_OTHER): Payer: BC Managed Care – PPO | Admitting: Family Medicine

## 2011-11-25 VITALS — BP 122/80 | HR 75 | Temp 98.2°F | Ht 68.5 in | Wt 214.8 lb

## 2011-11-25 DIAGNOSIS — M797 Fibromyalgia: Secondary | ICD-10-CM

## 2011-11-25 DIAGNOSIS — Z0000 Encounter for general adult medical examination without abnormal findings: Secondary | ICD-10-CM

## 2011-11-25 DIAGNOSIS — E785 Hyperlipidemia, unspecified: Secondary | ICD-10-CM

## 2011-11-25 DIAGNOSIS — IMO0001 Reserved for inherently not codable concepts without codable children: Secondary | ICD-10-CM

## 2011-11-25 DIAGNOSIS — F17200 Nicotine dependence, unspecified, uncomplicated: Secondary | ICD-10-CM

## 2011-11-25 DIAGNOSIS — I10 Essential (primary) hypertension: Secondary | ICD-10-CM

## 2011-11-25 DIAGNOSIS — Z720 Tobacco use: Secondary | ICD-10-CM

## 2011-11-25 LAB — BASIC METABOLIC PANEL
CO2: 28 mEq/L (ref 19–32)
Glucose, Bld: 81 mg/dL (ref 70–99)
Potassium: 4 mEq/L (ref 3.5–5.1)
Sodium: 142 mEq/L (ref 135–145)

## 2011-11-25 LAB — CBC WITH DIFFERENTIAL/PLATELET
Basophils Absolute: 0 10*3/uL (ref 0.0–0.1)
HCT: 42.2 % (ref 36.0–46.0)
Hemoglobin: 13.8 g/dL (ref 12.0–15.0)
Lymphs Abs: 3.1 10*3/uL (ref 0.7–4.0)
MCHC: 32.6 g/dL (ref 30.0–36.0)
Monocytes Relative: 6.5 % (ref 3.0–12.0)
Neutro Abs: 4.9 10*3/uL (ref 1.4–7.7)
RDW: 13.4 % (ref 11.5–14.6)

## 2011-11-25 LAB — POCT URINALYSIS DIPSTICK
Bilirubin, UA: NEGATIVE
Glucose, UA: NEGATIVE
Ketones, UA: NEGATIVE
Protein, UA: NEGATIVE
Spec Grav, UA: 1.01

## 2011-11-25 LAB — TSH: TSH: 0.88 u[IU]/mL (ref 0.35–5.50)

## 2011-11-25 LAB — LIPID PANEL
Cholesterol: 160 mg/dL (ref 0–200)
VLDL: 25 mg/dL (ref 0.0–40.0)

## 2011-11-25 LAB — HEPATIC FUNCTION PANEL
AST: 21 U/L (ref 0–37)
Albumin: 4.3 g/dL (ref 3.5–5.2)
Total Protein: 7 g/dL (ref 6.0–8.3)

## 2011-11-25 MED ORDER — VARENICLINE TARTRATE 0.5 MG X 11 & 1 MG X 42 PO MISC: ORAL | Status: AC

## 2011-11-25 NOTE — Assessment & Plan Note (Signed)
Stable con't meds and exercises

## 2011-11-25 NOTE — Patient Instructions (Signed)
Preventive Care for Adults, Female A healthy lifestyle and preventive care can promote health and wellness. Preventive health guidelines for women include the following key practices.  A routine yearly physical is a good way to check with your caregiver about your health and preventive screening. It is a chance to share any concerns and updates on your health, and to receive a thorough exam.   Visit your dentist for a routine exam and preventive care every 6 months. Brush your teeth twice a day and floss once a day. Good oral hygiene prevents tooth decay and gum disease.   The frequency of eye exams is based on your age, health, family medical history, use of contact lenses, and other factors. Follow your caregiver's recommendations for frequency of eye exams.   Eat a healthy diet. Foods like vegetables, fruits, whole grains, low-fat dairy products, and lean protein foods contain the nutrients you need without too many calories. Decrease your intake of foods high in solid fats, added sugars, and salt. Eat the right amount of calories for you.Get information about a proper diet from your caregiver, if necessary.   Regular physical exercise is one of the most important things you can do for your health. Most adults should get at least 150 minutes of moderate-intensity exercise (any activity that increases your heart rate and causes you to sweat) each week. In addition, most adults need muscle-strengthening exercises on 2 or more days a week.   Maintain a healthy weight. The body mass index (BMI) is a screening tool to identify possible weight problems. It provides an estimate of body fat based on height and weight. Your caregiver can help determine your BMI, and can help you achieve or maintain a healthy weight.For adults 20 years and older:   A BMI below 18.5 is considered underweight.   A BMI of 18.5 to 24.9 is normal.   A BMI of 25 to 29.9 is considered overweight.   A BMI of 30 and above is  considered obese.   Maintain normal blood lipids and cholesterol levels by exercising and minimizing your intake of saturated fat. Eat a balanced diet with plenty of fruit and vegetables. Blood tests for lipids and cholesterol should begin at age 20 and be repeated every 5 years. If your lipid or cholesterol levels are high, you are over 50, or you are at high risk for heart disease, you may need your cholesterol levels checked more frequently.Ongoing high lipid and cholesterol levels should be treated with medicines if diet and exercise are not effective.   If you smoke, find out from your caregiver how to quit. If you do not use tobacco, do not start.   If you are pregnant, do not drink alcohol. If you are breastfeeding, be very cautious about drinking alcohol. If you are not pregnant and choose to drink alcohol, do not exceed 1 drink per day. One drink is considered to be 12 ounces (355 mL) of beer, 5 ounces (148 mL) of wine, or 1.5 ounces (44 mL) of liquor.   Avoid use of street drugs. Do not share needles with anyone. Ask for help if you need support or instructions about stopping the use of drugs.   High blood pressure causes heart disease and increases the risk of stroke. Your blood pressure should be checked at least every 1 to 2 years. Ongoing high blood pressure should be treated with medicines if weight loss and exercise are not effective.   If you are 55 to 55   years old, ask your caregiver if you should take aspirin to prevent strokes.   Diabetes screening involves taking a blood sample to check your fasting blood sugar level. This should be done once every 3 years, after age 45, if you are within normal weight and without risk factors for diabetes. Testing should be considered at a younger age or be carried out more frequently if you are overweight and have at least 1 risk factor for diabetes.   Breast cancer screening is essential preventive care for women. You should practice "breast  self-awareness." This means understanding the normal appearance and feel of your breasts and may include breast self-examination. Any changes detected, no matter how small, should be reported to a caregiver. Women in their 20s and 30s should have a clinical breast exam (CBE) by a caregiver as part of a regular health exam every 1 to 3 years. After age 40, women should have a CBE every year. Starting at age 40, women should consider having a mammography (breast X-ray test) every year. Women who have a family history of breast cancer should talk to their caregiver about genetic screening. Women at a high risk of breast cancer should talk to their caregivers about having magnetic resonance imaging (MRI) and a mammography every year.   The Pap test is a screening test for cervical cancer. A Pap test can show cell changes on the cervix that might become cervical cancer if left untreated. A Pap test is a procedure in which cells are obtained and examined from the lower end of the uterus (cervix).   Women should have a Pap test starting at age 21.   Between ages 21 and 29, Pap tests should be repeated every 2 years.   Beginning at age 30, you should have a Pap test every 3 years as long as the past 3 Pap tests have been normal.   Some women have medical problems that increase the chance of getting cervical cancer. Talk to your caregiver about these problems. It is especially important to talk to your caregiver if a new problem develops soon after your last Pap test. In these cases, your caregiver may recommend more frequent screening and Pap tests.   The above recommendations are the same for women who have or have not gotten the vaccine for human papillomavirus (HPV).   If you had a hysterectomy for a problem that was not cancer or a condition that could lead to cancer, then you no longer need Pap tests. Even if you no longer need a Pap test, a regular exam is a good idea to make sure no other problems are  starting.   If you are between ages 65 and 70, and you have had normal Pap tests going back 10 years, you no longer need Pap tests. Even if you no longer need a Pap test, a regular exam is a good idea to make sure no other problems are starting.   If you have had past treatment for cervical cancer or a condition that could lead to cancer, you need Pap tests and screening for cancer for at least 20 years after your treatment.   If Pap tests have been discontinued, risk factors (such as a new sexual partner) need to be reassessed to determine if screening should be resumed.   The HPV test is an additional test that may be used for cervical cancer screening. The HPV test looks for the virus that can cause the cell changes on the cervix.   The cells collected during the Pap test can be tested for HPV. The HPV test could be used to screen women aged 30 years and older, and should be used in women of any age who have unclear Pap test results. After the age of 30, women should have HPV testing at the same frequency as a Pap test.   Colorectal cancer can be detected and often prevented. Most routine colorectal cancer screening begins at the age of 50 and continues through age 75. However, your caregiver may recommend screening at an earlier age if you have risk factors for colon cancer. On a yearly basis, your caregiver may provide home test kits to check for hidden blood in the stool. Use of a small camera at the end of a tube, to directly examine the colon (sigmoidoscopy or colonoscopy), can detect the earliest forms of colorectal cancer. Talk to your caregiver about this at age 50, when routine screening begins. Direct examination of the colon should be repeated every 5 to 10 years through age 75, unless early forms of pre-cancerous polyps or small growths are found.   Hepatitis C blood testing is recommended for all people born from 1945 through 1965 and any individual with known risks for hepatitis C.    Practice safe sex. Use condoms and avoid high-risk sexual practices to reduce the spread of sexually transmitted infections (STIs). STIs include gonorrhea, chlamydia, syphilis, trichomonas, herpes, HPV, and human immunodeficiency virus (HIV). Herpes, HIV, and HPV are viral illnesses that have no cure. They can result in disability, cancer, and death. Sexually active women aged 25 and younger should be checked for chlamydia. Older women with new or multiple partners should also be tested for chlamydia. Testing for other STIs is recommended if you are sexually active and at increased risk.   Osteoporosis is a disease in which the bones lose minerals and strength with aging. This can result in serious bone fractures. The risk of osteoporosis can be identified using a bone density scan. Women ages 65 and over and women at risk for fractures or osteoporosis should discuss screening with their caregivers. Ask your caregiver whether you should take a calcium supplement or vitamin D to reduce the rate of osteoporosis.   Menopause can be associated with physical symptoms and risks. Hormone replacement therapy is available to decrease symptoms and risks. You should talk to your caregiver about whether hormone replacement therapy is right for you.   Use sunscreen with sun protection factor (SPF) of 30 or more. Apply sunscreen liberally and repeatedly throughout the day. You should seek shade when your shadow is shorter than you. Protect yourself by wearing long sleeves, pants, a wide-brimmed hat, and sunglasses year round, whenever you are outdoors.   Once a month, do a whole body skin exam, using a mirror to look at the skin on your back. Notify your caregiver of new moles, moles that have irregular borders, moles that are larger than a pencil eraser, or moles that have changed in shape or color.   Stay current with required immunizations.   Influenza. You need a dose every fall (or winter). The composition of  the flu vaccine changes each year, so being vaccinated once is not enough.   Pneumococcal polysaccharide. You need 1 to 2 doses if you smoke cigarettes or if you have certain chronic medical conditions. You need 1 dose at age 65 (or older) if you have never been vaccinated.   Tetanus, diphtheria, pertussis (Tdap, Td). Get 1 dose of   Tdap vaccine if you are younger than age 65, are over 65 and have contact with an infant, are a healthcare worker, are pregnant, or simply want to be protected from whooping cough. After that, you need a Td booster dose every 10 years. Consult your caregiver if you have not had at least 3 tetanus and diphtheria-containing shots sometime in your life or have a deep or dirty wound.   HPV. You need this vaccine if you are a woman age 26 or younger. The vaccine is given in 3 doses over 6 months.   Measles, mumps, rubella (MMR). You need at least 1 dose of MMR if you were born in 1957 or later. You may also need a second dose.   Meningococcal. If you are age 19 to 21 and a first-year college student living in a residence hall, or have one of several medical conditions, you need to get vaccinated against meningococcal disease. You may also need additional booster doses.   Zoster (shingles). If you are age 60 or older, you should get this vaccine.   Varicella (chickenpox). If you have never had chickenpox or you were vaccinated but received only 1 dose, talk to your caregiver to find out if you need this vaccine.   Hepatitis A. You need this vaccine if you have a specific risk factor for hepatitis A virus infection or you simply wish to be protected from this disease. The vaccine is usually given as 2 doses, 6 to 18 months apart.   Hepatitis B. You need this vaccine if you have a specific risk factor for hepatitis B virus infection or you simply wish to be protected from this disease. The vaccine is given in 3 doses, usually over 6 months.  Preventive Services /  Frequency Ages 19 to 39  Blood pressure check.** / Every 1 to 2 years.   Lipid and cholesterol check.** / Every 5 years beginning at age 20.   Clinical breast exam.** / Every 3 years for women in their 20s and 30s.   Pap test.** / Every 2 years from ages 21 through 29. Every 3 years starting at age 30 through age 65 or 70 with a history of 3 consecutive normal Pap tests.   HPV screening.** / Every 3 years from ages 30 through ages 65 to 70 with a history of 3 consecutive normal Pap tests.   Hepatitis C blood test.** / For any individual with known risks for hepatitis C.   Skin self-exam. / Monthly.   Influenza immunization.** / Every year.   Pneumococcal polysaccharide immunization.** / 1 to 2 doses if you smoke cigarettes or if you have certain chronic medical conditions.   Tetanus, diphtheria, pertussis (Tdap, Td) immunization. / A one-time dose of Tdap vaccine. After that, you need a Td booster dose every 10 years.   HPV immunization. / 3 doses over 6 months, if you are 26 and younger.   Measles, mumps, rubella (MMR) immunization. / You need at least 1 dose of MMR if you were born in 1957 or later. You may also need a second dose.   Meningococcal immunization. / 1 dose if you are age 19 to 21 and a first-year college student living in a residence hall, or have one of several medical conditions, you need to get vaccinated against meningococcal disease. You may also need additional booster doses.   Varicella immunization.** / Consult your caregiver.   Hepatitis A immunization.** / Consult your caregiver. 2 doses, 6 to 18 months   apart.   Hepatitis B immunization.** / Consult your caregiver. 3 doses usually over 6 months.  Ages 40 to 64  Blood pressure check.** / Every 1 to 2 years.   Lipid and cholesterol check.** / Every 5 years beginning at age 20.   Clinical breast exam.** / Every year after age 40.   Mammogram.** / Every year beginning at age 40 and continuing for as  long as you are in good health. Consult with your caregiver.   Pap test.** / Every 3 years starting at age 30 through age 65 or 70 with a history of 3 consecutive normal Pap tests.   HPV screening.** / Every 3 years from ages 30 through ages 65 to 70 with a history of 3 consecutive normal Pap tests.   Fecal occult blood test (FOBT) of stool. / Every year beginning at age 50 and continuing until age 75. You may not need to do this test if you get a colonoscopy every 10 years.   Flexible sigmoidoscopy or colonoscopy.** / Every 5 years for a flexible sigmoidoscopy or every 10 years for a colonoscopy beginning at age 50 and continuing until age 75.   Hepatitis C blood test.** / For all people born from 1945 through 1965 and any individual with known risks for hepatitis C.   Skin self-exam. / Monthly.   Influenza immunization.** / Every year.   Pneumococcal polysaccharide immunization.** / 1 to 2 doses if you smoke cigarettes or if you have certain chronic medical conditions.   Tetanus, diphtheria, pertussis (Tdap, Td) immunization.** / A one-time dose of Tdap vaccine. After that, you need a Td booster dose every 10 years.   Measles, mumps, rubella (MMR) immunization. / You need at least 1 dose of MMR if you were born in 1957 or later. You may also need a second dose.   Varicella immunization.** / Consult your caregiver.   Meningococcal immunization.** / Consult your caregiver.   Hepatitis A immunization.** / Consult your caregiver. 2 doses, 6 to 18 months apart.   Hepatitis B immunization.** / Consult your caregiver. 3 doses, usually over 6 months.  Ages 65 and over  Blood pressure check.** / Every 1 to 2 years.   Lipid and cholesterol check.** / Every 5 years beginning at age 20.   Clinical breast exam.** / Every year after age 40.   Mammogram.** / Every year beginning at age 40 and continuing for as long as you are in good health. Consult with your caregiver.   Pap test.** /  Every 3 years starting at age 30 through age 65 or 70 with a 3 consecutive normal Pap tests. Testing can be stopped between 65 and 70 with 3 consecutive normal Pap tests and no abnormal Pap or HPV tests in the past 10 years.   HPV screening.** / Every 3 years from ages 30 through ages 65 or 70 with a history of 3 consecutive normal Pap tests. Testing can be stopped between 65 and 70 with 3 consecutive normal Pap tests and no abnormal Pap or HPV tests in the past 10 years.   Fecal occult blood test (FOBT) of stool. / Every year beginning at age 50 and continuing until age 75. You may not need to do this test if you get a colonoscopy every 10 years.   Flexible sigmoidoscopy or colonoscopy.** / Every 5 years for a flexible sigmoidoscopy or every 10 years for a colonoscopy beginning at age 50 and continuing until age 75.   Hepatitis   C blood test.** / For all people born from 1945 through 1965 and any individual with known risks for hepatitis C.   Osteoporosis screening.** / A one-time screening for women ages 65 and over and women at risk for fractures or osteoporosis.   Skin self-exam. / Monthly.   Influenza immunization.** / Every year.   Pneumococcal polysaccharide immunization.** / 1 dose at age 65 (or older) if you have never been vaccinated.   Tetanus, diphtheria, pertussis (Tdap, Td) immunization. / A one-time dose of Tdap vaccine if you are over 65 and have contact with an infant, are a healthcare worker, or simply want to be protected from whooping cough. After that, you need a Td booster dose every 10 years.   Varicella immunization.** / Consult your caregiver.   Meningococcal immunization.** / Consult your caregiver.   Hepatitis A immunization.** / Consult your caregiver. 2 doses, 6 to 18 months apart.   Hepatitis B immunization.** / Check with your caregiver. 3 doses, usually over 6 months.  ** Family history and personal history of risk and conditions may change your caregiver's  recommendations. Document Released: 07/27/2001 Document Revised: 05/20/2011 Document Reviewed: 10/26/2010 [employer] Patient Information 2012 [employer].Smoking Cessation This document explains the best ways for you to quit smoking and new treatments to help. It lists new medicines that can double or triple your chances of quitting and quitting for good. It also considers ways to avoid relapses and concerns you may have about quitting, including weight gain. NICOTINE: A POWERFUL ADDICTION If you have tried to quit smoking, you know how hard it can be. It is hard because nicotine is a very addictive drug. For some people, it can be as addictive as heroin or cocaine. Usually, people make 2 or 3 tries, or more, before finally being able to quit. Each time you try to quit, you can learn about what helps and what hurts. Quitting takes hard work and a lot of effort, but you can quit smoking. QUITTING SMOKING IS ONE OF THE MOST IMPORTANT THINGS YOU WILL EVER DO.  You will live longer, feel better, and live better.   The impact on your body of quitting smoking is felt almost immediately:   Within 20 minutes, blood pressure decreases. Pulse returns to its normal level.   After 8 hours, carbon monoxide levels in the blood return to normal. Oxygen level increases.   After 24 hours, chance of heart attack starts to decrease. Breath, hair, and body stop smelling like smoke.   After 48 hours, damaged nerve endings begin to recover. Sense of taste and smell improve.   After 72 hours, the body is virtually free of nicotine. Bronchial tubes relax and breathing becomes easier.   After 2 to 12 weeks, lungs can hold more air. Exercise becomes easier and circulation improves.   Quitting will reduce your risk of having a heart attack, stroke, cancer, or lung disease:   After 1 year, the risk of coronary heart disease is cut in half.   After 5 years, the risk of stroke falls to the same as a nonsmoker.    After 10 years, the risk of lung cancer is cut in half and the risk of other cancers decreases significantly.   After 15 years, the risk of coronary heart disease drops, usually to the level of a nonsmoker.   If you are pregnant, quitting smoking will improve your chances of having a healthy baby.   The people you live with, especially your children, will   be healthier.   You will have extra money to spend on things other than cigarettes.  FIVE KEYS TO QUITTING Studies have shown that these 5 steps will help you quit smoking and quit for good. You have the best chances of quitting if you use them together: 1. Get ready.  2. Get support and encouragement.  3. Learn new skills and behaviors.  4. Get medicine to reduce your nicotine addiction and use it correctly.  5. Be prepared for relapse or difficult situations. Be determined to continue trying to quit, even if you do not succeed at first.  1. GET READY  Set a quit date.   Change your environment.   Get rid of ALL cigarettes, ashtrays, matches, and lighters in your home, car, and place of work.   Do not let people smoke in your home.   Review your past attempts to quit. Think about what worked and what did not.   Once you quit, do not smoke. NOT EVEN A PUFF!  2. GET SUPPORT AND ENCOURAGEMENT Studies have shown that you have a better chance of being successful if you have help. You can get support in many ways.  Tell your family, friends, and coworkers that you are going to quit and need their support. Ask them not to smoke around you.   Talk to your caregivers (doctor, dentist, nurse, pharmacist, psychologist, and/or smoking counselor).   Get individual, group, or telephone counseling and support. The more counseling you have, the better your chances are of quitting. Programs are available at local hospitals and health centers. Call your local health department for information about programs in your area.   Spiritual beliefs  and practices may help some smokers quit.   Quit meters are small computer programs online or downloadable that keep track of quit statistics, such as amount of "quit-time," cigarettes not smoked, and money saved.   Many smokers find one or more of the many self-help books available useful in helping them quit and stay off tobacco.  3. LEARN NEW SKILLS AND BEHAVIORS  Try to distract yourself from urges to smoke. Talk to someone, go for a walk, or occupy your time with a task.   When you first try to quit, change your routine. Take a different route to work. Drink tea instead of coffee. Eat breakfast in a different place.   Do something to reduce your stress. Take a hot bath, exercise, or read a book.   Plan something enjoyable to do every day. Reward yourself for not smoking.   Explore interactive web-based programs that specialize in helping you quit.  4. GET MEDICINE AND USE IT CORRECTLY Medicines can help you stop smoking and decrease the urge to smoke. Combining medicine with the above behavioral methods and support can quadruple your chances of successfully quitting smoking. The [place] Food and Drug Administration (FDA) has approved 7 medicines to help you quit smoking. These medicines fall into 3 categories.  Nicotine replacement therapy (delivers nicotine to your body without the negative effects and risks of smoking):   Nicotine gum: Available over-the-counter.   Nicotine lozenges: Available over-the-counter.   Nicotine inhaler: Available by prescription.   Nicotine nasal spray: Available by prescription.   Nicotine skin patches (transdermal): Available by prescription and over-the-counter.   Antidepressant medicine (helps people abstain from smoking, but how this works is unknown):   Bupropion sustained-release (SR) tablets: Available by prescription.   Nicotinic receptor partial agonist (simulates the effect of nicotine in your brain):     Varenicline tartrate tablets:  Available by prescription.   Ask your caregiver for advice about which medicines to use and how to use them. Carefully read the information on the package.   Everyone who is trying to quit may benefit from using a medicine. If you are pregnant or trying to become pregnant, nursing an infant, you are under age 18, or you smoke fewer than 10 cigarettes per day, talk to your caregiver before taking any nicotine replacement medicines.   You should stop using a nicotine replacement product and call your caregiver if you experience nausea, dizziness, weakness, vomiting, fast or irregular heartbeat, mouth problems with the lozenge or gum, or redness or swelling of the skin around the patch that does not go away.   Do not use any other product containing nicotine while using a nicotine replacement product.   Talk to your caregiver before using these products if you have diabetes, heart disease, asthma, stomach ulcers, you had a recent heart attack, you have high blood pressure that is not controlled with medicine, a history of irregular heartbeat, or you have been prescribed medicine to help you quit smoking.  5. BE PREPARED FOR RELAPSE OR DIFFICULT SITUATIONS  Most relapses occur within the first 3 months after quitting. Do not be discouraged if you start smoking again. Remember, most people try several times before they finally quit.   You may have symptoms of withdrawal because your body is used to nicotine. You may crave cigarettes, be irritable, feel very hungry, cough often, get headaches, or have difficulty concentrating.   The withdrawal symptoms are only temporary. They are strongest when you first quit, but they will go away within 10 to 14 days.  Here are some difficult situations to watch for:  Alcohol. Avoid drinking alcohol. Drinking lowers your chances of successfully quitting.   Caffeine. Try to reduce the amount of caffeine you consume. It also lowers your chances of successfully  quitting.   Other smokers. Being around smoking can make you want to smoke. Avoid smokers.   Weight gain. Many smokers will gain weight when they quit, usually less than 10 pounds. Eat a healthy diet and stay active. Do not let weight gain distract you from your main goal, quitting smoking. Some medicines that help you quit smoking may also help delay weight gain. You can always lose the weight gained after you quit.   Bad mood or depression. There are a lot of ways to improve your mood other than smoking.  If you are having problems with any of these situations, talk to your caregiver. SPECIAL SITUATIONS AND CONDITIONS Studies suggest that everyone can quit smoking. Your situation or condition can give you a special reason to quit.  Pregnant women/new mothers: By quitting, you protect your baby's health and your own.   Hospitalized patients: By quitting, you reduce health problems and help healing.   Heart attack patients: By quitting, you reduce your risk of a second heart attack.   Lung, head, and neck cancer patients: By quitting, you reduce your chance of a second cancer.   Parents of children and adolescents: By quitting, you protect your children from illnesses caused by secondhand smoke.  QUESTIONS TO THINK ABOUT Think about the following questions before you try to stop smoking. You may want to talk about your answers with your caregiver.  Why do you want to quit?   If you tried to quit in the past, what helped and what did not?   What will   be the most difficult situations for you after you quit? How will you plan to handle them?   Who can help you through the tough times? Your family? Friends? Caregiver?   What pleasures do you get from smoking? What ways can you still get pleasure if you quit?  Here are some questions to ask your caregiver:  How can you help me to be successful at quitting?   What medicine do you think would be best for me and how should I take it?    What should I do if I need more help?   What is smoking withdrawal like? How can I get information on withdrawal?  Quitting takes hard work and a lot of effort, but you can quit smoking. FOR MORE INFORMATION  Smokefree.gov (http://www.smokefree.gov) provides free, accurate, evidence-based information and professional assistance to help support the immediate and long-term needs of people trying to quit smoking. Document Released: 05/25/2001 Document Revised: 05/20/2011 Document Reviewed: 03/17/2009 ExitCare Patient Information 2012 [employer]. 

## 2011-11-25 NOTE — Progress Notes (Signed)
Subjective:     Veronica Solomon is a 56 y.o. female and is here for a comprehensive physical exam. The patient reports no problems.  History   Social History  . Marital Status: Divorced    Spouse Name: N/A    Number of Children: N/A  . Years of Education: N/A   Occupational History  . Not on file.   Social History Main Topics  . Smoking status: Current Everyday Smoker -- 0.5 packs/day    Types: Cigarettes  . Smokeless tobacco: Never Used  . Alcohol Use: Yes  . Drug Use: No  . Sexually Active: Not Currently   Other Topics Concern  . Not on file   Social History Narrative  . No narrative on file   Health Maintenance  Topic Date Due  . Pap Smear  09/08/1974  . Mammogram  01/21/2011  . Influenza Vaccine  03/14/2012  . Tetanus/tdap  01/09/2017  . Colonoscopy  01/31/2019    The following portions of the patient's history were reviewed and updated as appropriate: allergies, current medications, past family history, past medical history, past social history, past surgical history and problem list.  Review of Systems Review of Systems  Constitutional: Negative for activity change, appetite change and fatigue.  HENT: Negative for hearing loss, congestion, tinnitus and ear discharge.  dentist--due Eyes: Negative for visual disturbance (see optho --due) Respiratory: Negative for cough, chest tightness and shortness of breath.   Cardiovascular: Negative for chest pain, palpitations and leg swelling.  Gastrointestinal: Negative for abdominal pain, diarrhea, constipation and abdominal distention.  Genitourinary: Negative for urgency, frequency, decreased urine volume and difficulty urinating.  Musculoskeletal: Negative for back pain, arthralgias and gait problem.  Skin: Negative for color change, pallor and rash.  Neurological: Negative for dizziness, light-headedness, numbness and headaches.  Hematological: Negative for adenopathy. Does not bruise/bleed easily.    Psychiatric/Behavioral: Negative for suicidal ideas, confusion, sleep disturbance, self-injury, dysphoric mood, decreased concentration and agitation.       Objective:    BP 122/80  Pulse 75  Temp 98.2 F (36.8 C) (Oral)  Ht 5' 8.5" (1.74 m)  Wt 214 lb 12.8 oz (97.433 kg)  BMI 32.19 kg/m2  SpO2 94% General appearance: alert, cooperative, appears stated age and no distress Head: Normocephalic, without obvious abnormality, atraumatic Eyes: conjunctivae/corneas clear. PERRL, EOM's intact. Fundi benign. Ears: normal TM's and external ear canals both ears Nose: Nares normal. Septum midline. Mucosa normal. No drainage or sinus tenderness. Throat: lips, mucosa, and tongue normal; teeth and gums normal Neck: no adenopathy, no carotid bruit, no JVD, supple, symmetrical, trachea midline and thyroid not enlarged, symmetric, no tenderness/mass/nodules Back: symmetric, no curvature. ROM normal. No CVA tenderness. Lungs: clear to auscultation bilaterally Breasts: normal appearance, no masses or tenderness Heart: regular rate and rhythm, S1, S2 normal, no murmur, click, rub or gallop Abdomen: soft, non-tender; bowel sounds normal; no masses,  no organomegaly Pelvic: not indicated; post-menopausal, no abnormal Pap smears in past,  bme neg,  Heme neg brown stool Extremities: extremities normal, atraumatic, no cyanosis or edema Pulses: 2+ and symmetric Skin: Skin color, texture, turgor normal. No rashes or lesions Lymph nodes: Cervical, supraclavicular, and axillary nodes normal. Neurologic: Alert and oriented X 3, normal strength and tone. Normal symmetric reflexes. Normal coordination and gait psych-- no depression, no anxiety    Assessment:    Healthy female exam.      Plan:    ghm utd---- pt refused mammogram and bmd due to finanaces Check labs See  After Visit Summary for Counseling Recommendations

## 2011-11-25 NOTE — Assessment & Plan Note (Signed)
Stable Cont meds 

## 2011-12-13 ENCOUNTER — Other Ambulatory Visit: Payer: Self-pay | Admitting: Family Medicine

## 2011-12-23 ENCOUNTER — Other Ambulatory Visit: Payer: Self-pay | Admitting: *Deleted

## 2011-12-23 MED ORDER — VARENICLINE TARTRATE 1 MG PO TABS: 1.0000 mg | ORAL_TABLET | Freq: Two times a day (BID) | ORAL | Status: AC

## 2011-12-23 NOTE — Telephone Encounter (Signed)
11-25-11, last OV, last filled #[phone].Please advise

## 2011-12-23 NOTE — Telephone Encounter (Signed)
Continuing Rx...  Faxed     KP

## 2011-12-23 NOTE — Telephone Encounter (Signed)
If it was not filled last month --- send in starter pack-----otherwise con't pack with 2 refills

## 2012-02-14 ENCOUNTER — Other Ambulatory Visit: Payer: Self-pay | Admitting: Family Medicine

## 2012-05-26 ENCOUNTER — Encounter: Payer: Self-pay | Admitting: Family Medicine

## 2012-05-26 ENCOUNTER — Ambulatory Visit (INDEPENDENT_AMBULATORY_CARE_PROVIDER_SITE_OTHER): Payer: BC Managed Care – PPO | Admitting: Family Medicine

## 2012-05-26 VITALS — BP 122/76 | HR 78 | Resp 14 | Wt 227.4 lb

## 2012-05-26 DIAGNOSIS — I10 Essential (primary) hypertension: Secondary | ICD-10-CM

## 2012-05-26 DIAGNOSIS — E785 Hyperlipidemia, unspecified: Secondary | ICD-10-CM

## 2012-05-26 LAB — HEPATIC FUNCTION PANEL
ALT: 24 U/L (ref 0–35)
AST: 22 U/L (ref 0–37)
Alkaline Phosphatase: 98 U/L (ref 39–117)
Bilirubin, Direct: 0 mg/dL (ref 0.0–0.3)
Total Protein: 7.3 g/dL (ref 6.0–8.3)

## 2012-05-26 LAB — BASIC METABOLIC PANEL
CO2: 27 mEq/L (ref 19–32)
Chloride: 101 mEq/L (ref 96–112)
Sodium: 137 mEq/L (ref 135–145)

## 2012-05-26 LAB — LIPID PANEL: Total CHOL/HDL Ratio: 3

## 2012-05-26 NOTE — Progress Notes (Signed)
  Subjective:    Patient here for follow-up of elevated blood pressure.  She is not exercising and is adherent to a low-salt diet.  Blood pressure is well controlled at home. Cardiac symptoms: none. Patient denies: chest pain, chest pressure/discomfort, claudication, dyspnea, exertional chest pressure/discomfort, fatigue, irregular heart beat, lower extremity edema, near-syncope, orthopnea, palpitations, paroxysmal nocturnal dyspnea, syncope and tachypnea. Cardiovascular risk factors: dyslipidemia, hypertension and obesity (BMI >= 30 kg/m2). Use of agents associated with hypertension: none. History of target organ damage: none.  The following portions of the patient's history were reviewed and updated as appropriate: allergies, current medications, past family history, past medical history, past social history, past surgical history and problem list.  Review of Systems Pertinent items are noted in HPI.     Objective:    BP 122/76  Pulse 78  Resp 14  Wt 227 lb 6.4 oz (103.148 kg)  SpO2 95% General appearance: alert, cooperative, appears stated age and no distress Neck: no adenopathy, no carotid bruit, no JVD, supple, symmetrical, trachea midline and thyroid not enlarged, symmetric, no tenderness/mass/nodules Lungs: clear to auscultation bilaterally Heart: S1, S2 normal Extremities: extremities normal, atraumatic, no cyanosis or edema    Assessment:    Hypertension, normal blood pressure . Evidence of target organ damage: none.   hyperllipidemia --- check labs Plan:    Medication: no change. Dietary sodium restriction. Regular aerobic exercise. Check blood pressures 2-3 times weekly and record. Follow up: 6 months and as needed.

## 2012-05-26 NOTE — Patient Instructions (Signed)

## 2012-06-06 ENCOUNTER — Encounter: Payer: Self-pay | Admitting: *Deleted

## 2012-06-16 ENCOUNTER — Other Ambulatory Visit: Payer: Self-pay | Admitting: Family Medicine

## 2012-08-08 ENCOUNTER — Other Ambulatory Visit: Payer: Self-pay | Admitting: Family Medicine

## 2012-08-08 ENCOUNTER — Telehealth: Payer: Self-pay | Admitting: Family Medicine

## 2012-08-08 NOTE — Telephone Encounter (Signed)
i took referral out because I thought I had seen her for shoulder pain but all I see is fibromyalgia pain and generalized joint pain.  I don't see where i saw her specifically for her shoulder.

## 2012-08-08 NOTE — Telephone Encounter (Signed)
Ortho referral put in

## 2012-08-08 NOTE — Telephone Encounter (Signed)
Please advise      KP 

## 2012-08-08 NOTE — Telephone Encounter (Signed)
Patient called stating her shoulder is still not better and she would like to pursue ortho referral for cortisone injections. She would like an afternoon appt.

## 2012-08-09 NOTE — Telephone Encounter (Signed)
Please offer this patient an apt for the shoulder pain.     KP

## 2012-08-09 NOTE — Telephone Encounter (Signed)
Thank You.

## 2012-08-09 NOTE — Telephone Encounter (Signed)
Called pt on hp# as per DPR ok to leave a detailed mess. LMOM for pt to call & schedule appt to validate referral

## 2012-08-10 ENCOUNTER — Ambulatory Visit (INDEPENDENT_AMBULATORY_CARE_PROVIDER_SITE_OTHER): Payer: BC Managed Care – PPO | Admitting: Family Medicine

## 2012-08-10 ENCOUNTER — Telehealth: Payer: Self-pay

## 2012-08-10 ENCOUNTER — Encounter: Payer: Self-pay | Admitting: Family Medicine

## 2012-08-10 VITALS — BP 132/74 | HR 97 | Temp 97.9°F | Wt 231.0 lb

## 2012-08-10 DIAGNOSIS — M25519 Pain in unspecified shoulder: Secondary | ICD-10-CM

## 2012-08-10 DIAGNOSIS — M25511 Pain in right shoulder: Secondary | ICD-10-CM

## 2012-08-10 NOTE — Patient Instructions (Signed)
Shoulder Pain The shoulder is the joint that connects your arms to your body. The bones that form the shoulder joint include the upper arm bone (humerus), the shoulder blade (scapula), and the collarbone (clavicle). The top of the humerus is shaped like a ball and fits into a rather flat socket on the scapula (glenoid cavity). A combination of muscles and strong, fibrous tissues that connect muscles to bones (tendons) support your shoulder joint and hold the ball in the socket. Small, fluid-filled sacs (bursae) are located in different areas of the joint. They act as cushions between the bones and the overlying soft tissues and help reduce friction between the gliding tendons and the bone as you move your arm. Your shoulder joint allows a wide range of motion in your arm. This range of motion allows you to do things like scratch your back or throw a ball. However, this range of motion also makes your shoulder more prone to pain from overuse and injury. Causes of shoulder pain can originate from both injury and overuse and usually can be grouped in the following four categories:  Redness, swelling, and pain (inflammation) of the tendon (tendinitis) or the bursae (bursitis).  Instability, such as a dislocation of the joint.  Inflammation of the joint (arthritis).  Broken bone (fracture). HOME CARE INSTRUCTIONS   Apply ice to the sore area.  Put ice in a plastic bag.  Place a towel between your skin and the bag.  Leave the ice on for 15 to 20 minutes, 3 to 4 times per day for the first 2 days.  If you have a shoulder sling or immobilizer, wear it as long as your caregiver instructs. Only remove it to shower or bathe. Move your arm as little as possible, but keep your hand moving to prevent swelling.  Only take over-the-counter or prescription medicines for pain, discomfort, or fever as directed by your caregiver. SEEK MEDICAL CARE IF:   Your shoulder pain increases, or new pain develops in  your arm, hand, or fingers.  Your hand or fingers become cold and numb.  Your pain is not relieved with medicines. SEEK IMMEDIATE MEDICAL CARE IF:   Your arm, hand, or fingers are numb or tingling.  Your arm, hand, or fingers are significantly swollen or turn white or blue. MAKE SURE YOU:   Understand these instructions.  Will watch your condition.  Will get help right away if you are not doing well or get worse. Document Released: 03/10/2005 Document Revised: 08/23/2011 Document Reviewed: 05/15/2011 ExitCare Patient Information 2013 [employer].  

## 2012-08-10 NOTE — Progress Notes (Signed)
  Subjective:    Veronica Solomon is a 57 y.o. female who presents with right shoulder pain. The symptoms began several weeks ago. Aggravating factors: no known event. Pain is located around the acromioclavicular [facility]) joint. Discomfort is described as aching and throbbing. Symptoms are exacerbated by repetitive movements and overhead movements. Evaluation to date: rheum referral. Therapy to date includes: rest, ice, OTC analgesics which are not very effective and home exercises which are not very effective.  The following portions of the patient's history were reviewed and updated as appropriate: allergies, current medications, past family history, past medical history, past social history, past surgical history and problem list.  Review of Systems Pertinent items are noted in HPI.   Objective:    BP 132/74  Pulse 97  Temp(Src) 97.9 F (36.6 C) (Oral)  Wt 231 lb (104.781 kg)  BMI 34.61 kg/m2  SpO2 97% Right shoulder: full ROM and pain with rom, no pain with palpation  Left shoulder: normal active ROM, no tenderness, no impingement sign     Assessment:    Right shoulder pain    Plan:    Natural history and expected course discussed. Questions answered. Reduction in offending activity. Gentle ROM exercises. Rest, ice, compression, and elevation (RICE) therapy. Orthopedics referral.

## 2012-08-10 NOTE — Telephone Encounter (Signed)
Call from [place] at [facility] and she got the patient scheudled for tomorrow 08/11/12 at 8:30am at [facility]. Patient has been made aware.      KP

## 2012-08-17 ENCOUNTER — Other Ambulatory Visit: Payer: Self-pay | Admitting: Family Medicine

## 2012-11-24 ENCOUNTER — Ambulatory Visit (INDEPENDENT_AMBULATORY_CARE_PROVIDER_SITE_OTHER): Payer: BC Managed Care – PPO | Admitting: Family Medicine

## 2012-11-24 ENCOUNTER — Encounter: Payer: Self-pay | Admitting: Family Medicine

## 2012-11-24 VITALS — BP 128/86 | HR 76 | Temp 98.7°F | Wt 230.6 lb

## 2012-11-24 DIAGNOSIS — I10 Essential (primary) hypertension: Secondary | ICD-10-CM

## 2012-11-24 DIAGNOSIS — D239 Other benign neoplasm of skin, unspecified: Secondary | ICD-10-CM

## 2012-11-24 DIAGNOSIS — IMO0001 Reserved for inherently not codable concepts without codable children: Secondary | ICD-10-CM

## 2012-11-24 DIAGNOSIS — K589 Irritable bowel syndrome without diarrhea: Secondary | ICD-10-CM

## 2012-11-24 DIAGNOSIS — M797 Fibromyalgia: Secondary | ICD-10-CM

## 2012-11-24 DIAGNOSIS — D229 Melanocytic nevi, unspecified: Secondary | ICD-10-CM

## 2012-11-24 LAB — HEPATIC FUNCTION PANEL
ALT: 25 U/L (ref 0–35)
Alkaline Phosphatase: 96 U/L (ref 39–117)
Bilirubin, Direct: 0.1 mg/dL (ref 0.0–0.3)
Total Protein: 7.2 g/dL (ref 6.0–8.3)

## 2012-11-24 LAB — POCT URINALYSIS DIPSTICK
Bilirubin, UA: NEGATIVE
Blood, UA: NEGATIVE
Glucose, UA: NEGATIVE
Ketones, UA: NEGATIVE
Nitrite, UA: NEGATIVE
Spec Grav, UA: <=1.005
pH, UA: 6

## 2012-11-24 LAB — MICROALBUMIN / CREATININE URINE RATIO
Creatinine,U: 118.7 mg/dL
Microalb Creat Ratio: 3.5 mg/g (ref 0.0–30.0)

## 2012-11-24 LAB — BASIC METABOLIC PANEL
CO2: 28 mEq/L (ref 19–32)
Calcium: 10 mg/dL (ref 8.4–10.5)
Chloride: 103 mEq/L (ref 96–112)
Sodium: 140 mEq/L (ref 135–145)

## 2012-11-24 LAB — LIPID PANEL
LDL Cholesterol: 68 mg/dL (ref 0–99)
VLDL: 21 mg/dL (ref 0.0–40.0)

## 2012-11-24 MED ORDER — HYDROCODONE-ACETAMINOPHEN 5-325 MG PO TABS: 1.0000 | ORAL_TABLET | Freq: Four times a day (QID) | ORAL | Status: DC | PRN

## 2012-11-24 MED ORDER — HYOSCYAMINE SULFATE 0.125 MG SL SUBL: 0.1250 mg | SUBLINGUAL_TABLET | SUBLINGUAL | Status: DC | PRN

## 2012-11-24 NOTE — Assessment & Plan Note (Signed)
Will refill pain med when needed since pt uses so rarely

## 2012-11-24 NOTE — Assessment & Plan Note (Signed)
Stable con't meds 

## 2012-11-24 NOTE — Assessment & Plan Note (Signed)
Refill IBS If no relief GI referral

## 2012-11-24 NOTE — Assessment & Plan Note (Signed)
Derm referral for skin check suspicous mole Rlow back ---? Basal cell carcinoma

## 2012-11-24 NOTE — Progress Notes (Signed)
  Subjective:    Patient here for follow-up of elevated blood pressure.  She is exercising and is adherent to a low-salt diet.  Blood pressure is well controlled at home. Cardiac symptoms: none. Patient denies: chest pain, chest pressure/discomfort, claudication, dyspnea, exertional chest pressure/discomfort, fatigue, irregular heart beat, lower extremity edema, near-syncope, orthopnea, palpitations, paroxysmal nocturnal dyspnea, syncope and tachypnea. Cardiovascular risk factors: dyslipidemia, hypertension and obesity (BMI >= 30 kg/m2). Use of agents associated with hypertension: none. History of target organ damage: none.  Pt also had an episode of IBS which is now resolved but she would like a refill of the levsin to help in case it occurs again.  Pt also was given hydrocodone for shoulder pain and it helped with her fibro--- #30 pills have lasted since February.    The following portions of the patient's history were reviewed and updated as appropriate: allergies, current medications, past family history, past medical history, past social history, past surgical history and problem list.  Review of Systems Pertinent items are noted in HPI.     Objective:    BP 128/86  Pulse 76  Temp(Src) 98.7 F (37.1 C) (Oral)  Wt 230 lb 9.6 oz (104.599 kg)  BMI 34.55 kg/m2  SpO2 95% General appearance: alert, cooperative, appears stated age and no distress Lungs: clear to auscultation bilaterally Heart: S1, S2 normal and +2/5 murmur Extremities: extremities normal, atraumatic, no cyanosis or edema   skin-- fleshy mole R L back Assessment:    Hypertension, normal blood pressure . Evidence of target organ damage: none.    Plan:    Medication: no change. Dietary sodium restriction. Regular aerobic exercise. Check blood pressures 2-3 times weekly and record. Follow up: 6 months and as needed.

## 2012-11-24 NOTE — Patient Instructions (Addendum)

## 2012-11-28 ENCOUNTER — Encounter: Payer: Self-pay | Admitting: Family Medicine

## 2012-12-07 NOTE — Addendum Note (Signed)
Addended by: Silvio Pate D on: 12/07/2012 02:33 PM   Modules accepted: Orders

## 2012-12-08 LAB — PROTEIN, URINE, 24 HOUR: Protein, Urine: <3 mg/dL

## 2012-12-18 ENCOUNTER — Other Ambulatory Visit: Payer: Self-pay | Admitting: Family Medicine

## 2013-01-06 ENCOUNTER — Ambulatory Visit (INDEPENDENT_AMBULATORY_CARE_PROVIDER_SITE_OTHER): Payer: BC Managed Care – PPO | Admitting: Emergency Medicine

## 2013-01-06 VITALS — BP 131/83 | HR 91 | Temp 98.0°F | Resp 17 | Ht 68.5 in | Wt 231.0 lb

## 2013-01-06 DIAGNOSIS — R300 Dysuria: Secondary | ICD-10-CM

## 2013-01-06 DIAGNOSIS — N390 Urinary tract infection, site not specified: Secondary | ICD-10-CM

## 2013-01-06 LAB — POCT UA - MICROSCOPIC ONLY
Casts, Ur, LPF, POC: NEGATIVE
Crystals, Ur, HPF, POC: NEGATIVE
Mucus, UA: NEGATIVE
Yeast, UA: NEGATIVE

## 2013-01-06 LAB — POCT URINALYSIS DIPSTICK
Bilirubin, UA: NEGATIVE
Glucose, UA: NEGATIVE
Ketones, UA: NEGATIVE
Nitrite, UA: NEGATIVE
Spec Grav, UA: 1.015
Urobilinogen, UA: 0.2
pH, UA: 6.5

## 2013-01-06 MED ORDER — CIPROFLOXACIN HCL 250 MG PO TABS: 250.0000 mg | ORAL_TABLET | Freq: Two times a day (BID) | ORAL | Status: DC

## 2013-01-06 NOTE — Patient Instructions (Signed)
Urinary Tract Infection  Urinary tract infections (UTIs) can develop anywhere along your urinary tract. Your urinary tract is your body's drainage system for removing wastes and extra water. Your urinary tract includes two kidneys, two ureters, a bladder, and a urethra. Your kidneys are a pair of bean-shaped organs. Each kidney is about the size of your fist. They are located below your ribs, one on each side of your spine.  CAUSES  Infections are caused by microbes, which are microscopic organisms, including fungi, viruses, and bacteria. These organisms are so small that they can only be seen through a microscope. Bacteria are the microbes that most commonly cause UTIs.  SYMPTOMS   Symptoms of UTIs may vary by age and gender of the patient and by the location of the infection. Symptoms in young women typically include a frequent and intense urge to urinate and a painful, burning feeling in the bladder or urethra during urination. Older women and men are more likely to be tired, shaky, and weak and have muscle aches and abdominal pain. A fever may mean the infection is in your kidneys. Other symptoms of a kidney infection include pain in your back or sides below the ribs, nausea, and vomiting.  DIAGNOSIS  To diagnose a UTI, your caregiver will ask you about your symptoms. Your caregiver also will ask to provide a urine sample. The urine sample will be tested for bacteria and white blood cells. White blood cells are made by your body to help fight infection.  TREATMENT   Typically, UTIs can be treated with medication. Because most UTIs are caused by a bacterial infection, they usually can be treated with the use of antibiotics. The choice of antibiotic and length of treatment depend on your symptoms and the type of bacteria causing your infection.  HOME CARE INSTRUCTIONS   If you were prescribed antibiotics, take them exactly as your caregiver instructs you. Finish the medication even if you feel better after you  have only taken some of the medication.   Drink enough water and fluids to keep your urine clear or pale yellow.   Avoid caffeine, tea, and carbonated beverages. They tend to irritate your bladder.   Empty your bladder often. Avoid holding urine for long periods of time.   Empty your bladder before and after sexual intercourse.   After a bowel movement, women should cleanse from front to back. Use each tissue only once.  SEEK MEDICAL CARE IF:    You have back pain.   You develop a fever.   Your symptoms do not begin to resolve within 3 days.  SEEK IMMEDIATE MEDICAL CARE IF:    You have severe back pain or lower abdominal pain.   You develop chills.   You have nausea or vomiting.   You have continued burning or discomfort with urination.  MAKE SURE YOU:    Understand these instructions.   Will watch your condition.   Will get help right away if you are not doing well or get worse.  Document Released: 03/10/2005 Document Revised: 11/30/2011 Document Reviewed: 07/09/2011  ExitCare Patient Information 2014 [employer].

## 2013-01-06 NOTE — Progress Notes (Signed)
  Subjective:    Patient ID: Veronica Solomon, female    DOB: 1956/02/21, 57 y.o.   MRN: [id number]  HPI pt presents with tenderness and swelling in the labia area. She has noticed blood in urine 4 days. She has taken Azo for the symptoms. It has been "years" since last UTI. She is getting up at night to urinate.     Review of Systems     Objective:   Physical Exam there is no CVA pain. Chest is clear to auscultation and percussion. Abdomen is soft and nontender. There appears to be semi-irritation around the urethral meatus  Results for orders placed in visit on 01/06/13  POCT UA - MICROSCOPIC ONLY      Result Value Range   WBC, Ur, HPF, POC tntc     RBC, urine, microscopic 4-5     Bacteria, U Microscopic trace     Mucus, UA neg     Epithelial cells, urine per micros 0-1     Crystals, Ur, HPF, POC neg     Casts, Ur, LPF, POC neg     Yeast, UA neg    POCT URINALYSIS DIPSTICK      Result Value Range   Color, UA yellow     Clarity, UA cloudy     Glucose, UA neg     Bilirubin, UA neg     Ketones, UA neg     Spec Grav, UA 1.015     Blood, UA large     pH, UA 6.5     Protein, UA trace     Urobilinogen, UA 0.2     Nitrite, UA neg     Leukocytes, UA moderate (2+)          Assessment & Plan:  We'll treat with Cipro 250 twice a day x7 days. Urine culture will be ordered.

## 2013-01-06 NOTE — Progress Notes (Deleted)
  Subjective:    Patient ID: Veronica Solomon, female    DOB: 1956-02-08, 57 y.o.   MRN: [id number]  HPI    Review of Systems     Objective:   Physical Exam        Assessment & Plan:

## 2013-01-07 LAB — URINE CULTURE: Colony Count: 25000

## 2013-01-08 ENCOUNTER — Telehealth: Payer: Self-pay | Admitting: Family Medicine

## 2013-01-08 ENCOUNTER — Other Ambulatory Visit: Payer: Self-pay | Admitting: *Deleted

## 2013-01-08 MED ORDER — CELECOXIB 200 MG PO CAPS: ORAL_CAPSULE | ORAL | Status: DC

## 2013-01-08 NOTE — Telephone Encounter (Signed)
Rx was refilled for celebrex 30 ct. 01/08/13

## 2013-01-11 NOTE — Telephone Encounter (Signed)
Patient left msg on triage line stating we need to complete PA in order for her insurance to pay for the celebrex.

## 2013-01-12 NOTE — Telephone Encounter (Signed)
Patient is calling back. She is out of meds and needs [place] to complete prior auth.

## 2013-01-15 NOTE — Telephone Encounter (Signed)
Patient states that we need to call her insurance company at [phone] to answer a few questions before medication can be authorized.

## 2013-01-15 NOTE — Telephone Encounter (Signed)
Patient is calling back about Celebrex medication authorization. Please advise.

## 2013-01-16 ENCOUNTER — Other Ambulatory Visit: Payer: Self-pay | Admitting: Family Medicine

## 2013-01-16 DIAGNOSIS — M797 Fibromyalgia: Secondary | ICD-10-CM

## 2013-01-16 NOTE — Telephone Encounter (Signed)
Last seen and filled 11/24/12. No UDS or contract on file. Please advise      KP

## 2013-01-17 ENCOUNTER — Telehealth: Payer: Self-pay | Admitting: *Deleted

## 2013-01-17 NOTE — Telephone Encounter (Signed)
Pharmacy requested a refill for celebrex capsules 01/16/13 I called patient insurance and obtained authorization and form has been faxed to [facility] patient pharmacy. 01/17/13  AG cma

## 2013-01-18 NOTE — Telephone Encounter (Signed)
Called pharmacy and the medication has been approved with a $64 co-pay. Advised patient.

## 2013-02-07 MED ORDER — HYDROCODONE-ACETAMINOPHEN 5-325 MG PO TABS: 1.0000 | ORAL_TABLET | Freq: Four times a day (QID) | ORAL | Status: DC | PRN

## 2013-02-13 ENCOUNTER — Other Ambulatory Visit: Payer: Self-pay | Admitting: Family Medicine

## 2013-04-15 ENCOUNTER — Other Ambulatory Visit: Payer: Self-pay | Admitting: Family Medicine

## 2013-04-19 ENCOUNTER — Other Ambulatory Visit: Payer: Self-pay

## 2013-06-17 ENCOUNTER — Other Ambulatory Visit: Payer: Self-pay | Admitting: Family Medicine

## 2013-06-22 ENCOUNTER — Encounter: Payer: Self-pay | Admitting: Lab

## 2013-06-25 ENCOUNTER — Ambulatory Visit (INDEPENDENT_AMBULATORY_CARE_PROVIDER_SITE_OTHER): Payer: BC Managed Care – PPO | Admitting: Family Medicine

## 2013-06-25 ENCOUNTER — Encounter: Payer: Self-pay | Admitting: Family Medicine

## 2013-06-25 VITALS — BP 132/92 | HR 82 | Temp 98.2°F | Wt 228.0 lb

## 2013-06-25 DIAGNOSIS — E669 Obesity, unspecified: Secondary | ICD-10-CM | POA: Insufficient documentation

## 2013-06-25 DIAGNOSIS — IMO0001 Reserved for inherently not codable concepts without codable children: Secondary | ICD-10-CM

## 2013-06-25 DIAGNOSIS — E785 Hyperlipidemia, unspecified: Secondary | ICD-10-CM

## 2013-06-25 DIAGNOSIS — M797 Fibromyalgia: Secondary | ICD-10-CM

## 2013-06-25 DIAGNOSIS — I10 Essential (primary) hypertension: Secondary | ICD-10-CM

## 2013-06-25 MED ORDER — CELECOXIB 200 MG PO CAPS: ORAL_CAPSULE | ORAL | Status: DC

## 2013-06-25 MED ORDER — DULOXETINE HCL 60 MG PO CPEP: ORAL_CAPSULE | ORAL | Status: DC

## 2013-06-25 NOTE — Progress Notes (Signed)
  Subjective:    Patient here for follow-up of elevated blood pressure.  She is exercising and is adherent to a low-salt diet.  Blood pressure is not well controlled at home---because she has been in a lot of pain. Cardiac symptoms: none. Patient denies: chest pain, chest pressure/discomfort, claudication, dyspnea, exertional chest pressure/discomfort, fatigue, irregular heart beat, lower extremity edema, near-syncope, orthopnea, palpitations, paroxysmal nocturnal dyspnea, syncope and tachypnea. Cardiovascular risk factors: dyslipidemia, hypertension and obesity (BMI >= 30 kg/m2). Use of agents associated with hypertension: none. History of target organ damage: none.  Pt fibro has been causing a lot of pain and celebrex is too expensive  The following portions of the patient's history were reviewed and updated as appropriate: allergies, current medications, past family history, past medical history, past social history, past surgical history and problem list.  Review of Systems Pertinent items are noted in HPI.     Objective:    BP 134/90  Pulse 82  Temp(Src) 98.2 F (36.8 C) (Oral)  Wt 228 lb (103.42 kg)  SpO2 96% General appearance: alert, cooperative, appears stated age and no distress Nose: Nares normal. Septum midline. Mucosa normal. No drainage or sinus tenderness. Neck: no adenopathy, no carotid bruit, no JVD, supple, symmetrical, trachea midline and thyroid not enlarged, symmetric, no tenderness/mass/nodules Lungs: clear to auscultation bilaterally Heart: S1, S2 normal Extremities: extremities normal, atraumatic, no cyanosis or edema    Assessment:    Hypertension, stage 1 . Evidence of target organ damage: none.    Plan:    Medication: no change and pt would like to wait 3 weeks and recheck bp. Dietary sodium restriction. Regular aerobic exercise. Follow up: 3 weeks and as needed.

## 2013-06-25 NOTE — Progress Notes (Signed)
Pre visit review using our clinic review tool, if applicable. No additional management support is needed unless otherwise documented below in the visit note. 

## 2013-06-25 NOTE — Assessment & Plan Note (Signed)
Inc cymbalta to 2 a day Coupon for $4 celebrex given

## 2013-06-25 NOTE — Patient Instructions (Signed)

## 2013-06-25 NOTE — Assessment & Plan Note (Addendum)
Elevated today but pt is in pain Recheck 3 weeks

## 2013-06-26 ENCOUNTER — Telehealth: Payer: Self-pay | Admitting: Family Medicine

## 2013-06-26 ENCOUNTER — Encounter: Payer: Self-pay | Admitting: Family Medicine

## 2013-06-26 LAB — LIPID PANEL
CHOL/HDL RATIO: 3
Cholesterol: 190 mg/dL (ref 0–200)
HDL: 59.1 mg/dL (ref >39.00)
LDL CALC: 99 mg/dL (ref 0–99)
Triglycerides: 161 mg/dL — ABNORMAL HIGH (ref 0.0–149.0)
VLDL: 32.2 mg/dL (ref 0.0–40.0)

## 2013-06-26 LAB — BASIC METABOLIC PANEL
BUN: 12 mg/dL (ref 6–23)
CALCIUM: 10 mg/dL (ref 8.4–10.5)
CO2: 29 mEq/L (ref 19–32)
CREATININE: 0.8 mg/dL (ref 0.4–1.2)
Chloride: 104 mEq/L (ref 96–112)
GFR: 84.72 mL/min (ref >60.00)
Glucose, Bld: 73 mg/dL (ref 70–99)
Potassium: 4.5 mEq/L (ref 3.5–5.1)
Sodium: 140 mEq/L (ref 135–145)

## 2013-06-26 LAB — HEPATIC FUNCTION PANEL
ALT: 23 U/L (ref 0–35)
AST: 24 U/L (ref 0–37)
Albumin: 4.6 g/dL (ref 3.5–5.2)
Alkaline Phosphatase: 97 U/L (ref 39–117)
BILIRUBIN TOTAL: 0.5 mg/dL (ref 0.3–1.2)
Bilirubin, Direct: 0.1 mg/dL (ref 0.0–0.3)
Total Protein: 8.2 g/dL (ref 6.0–8.3)

## 2013-06-26 NOTE — Telephone Encounter (Signed)
Relevant patient education assigned to patient using Emmi. ° °

## 2013-07-09 ENCOUNTER — Encounter: Payer: Self-pay | Admitting: Family Medicine

## 2013-07-09 ENCOUNTER — Ambulatory Visit (INDEPENDENT_AMBULATORY_CARE_PROVIDER_SITE_OTHER): Payer: BC Managed Care – PPO | Admitting: Family Medicine

## 2013-07-09 VITALS — BP 130/76 | HR 84 | Temp 98.2°F | Wt 230.0 lb

## 2013-07-09 DIAGNOSIS — I10 Essential (primary) hypertension: Secondary | ICD-10-CM

## 2013-07-09 DIAGNOSIS — IMO0001 Reserved for inherently not codable concepts without codable children: Secondary | ICD-10-CM

## 2013-07-09 DIAGNOSIS — M797 Fibromyalgia: Secondary | ICD-10-CM

## 2013-07-09 NOTE — Progress Notes (Signed)
  Subjective:    Patient here for follow-up of elevated blood pressure.  She is not exercising and is adherent to a low-salt diet.  Blood pressure is well controlled at home. Cardiac symptoms: none. Patient denies: chest pain, chest pressure/discomfort, claudication, dyspnea, exertional chest pressure/discomfort, irregular heart beat, lower extremity edema, near-syncope, orthopnea, palpitations, paroxysmal nocturnal dyspnea, syncope and tachypnea. Cardiovascular risk factors: dyslipidemia, hypertension, obesity (BMI >= 30 kg/m2) and sedentary lifestyle. Use of agents associated with hypertension: none. History of target organ damage: none.  The following portions of the patient's history were reviewed and updated as appropriate: allergies, current medications, past family history, past medical history, past social history, past surgical history and problem list.  Review of Systems Pertinent items are noted in HPI.     Objective:    BP 130/76  Pulse 84  Temp(Src) 98.2 F (36.8 C) (Oral)  Wt 230 lb (104.327 kg)  SpO2 97% General appearance: alert, cooperative, appears stated age and no distress Throat: lips, mucosa, and tongue normal; teeth and gums normal Neck: no adenopathy, supple, symmetrical, trachea midline and thyroid not enlarged, symmetric, no tenderness/mass/nodules Lungs: clear to auscultation bilaterally Heart: S1, S2 normal Extremities: extremities normal, atraumatic, no cyanosis or edema    Assessment:    Hypertension, normal blood pressure . Evidence of target organ damage: none.    Plan:    Medication: no change. Dietary sodium restriction. Regular aerobic exercise. Follow up: 3 months and as needed.

## 2013-07-09 NOTE — Progress Notes (Signed)
Pre visit review using our clinic review tool, if applicable. No additional management support is needed unless otherwise documented below in the visit note. 

## 2013-07-09 NOTE — Patient Instructions (Signed)

## 2013-07-09 NOTE — Assessment & Plan Note (Signed)
Pt doing much better with meds rto 3 months or sooner prn

## 2013-07-18 ENCOUNTER — Other Ambulatory Visit: Payer: Self-pay | Admitting: Family Medicine

## 2013-07-18 DIAGNOSIS — M797 Fibromyalgia: Secondary | ICD-10-CM

## 2013-07-18 MED ORDER — HYDROCODONE-ACETAMINOPHEN 5-325 MG PO TABS: 1.0000 | ORAL_TABLET | Freq: Four times a day (QID) | ORAL | Status: DC | PRN

## 2013-07-18 NOTE — Telephone Encounter (Signed)
Last seen 07/09/13 and filled 01/16/13 #30. Please advise       KP

## 2013-07-19 ENCOUNTER — Other Ambulatory Visit: Payer: Self-pay | Admitting: Family Medicine

## 2013-07-20 ENCOUNTER — Other Ambulatory Visit: Payer: Self-pay

## 2013-07-20 DIAGNOSIS — M797 Fibromyalgia: Secondary | ICD-10-CM

## 2013-07-20 MED ORDER — HYDROCODONE-ACETAMINOPHEN 5-325 MG PO TABS: 1.0000 | ORAL_TABLET | Freq: Four times a day (QID) | ORAL | Status: DC | PRN

## 2013-08-05 ENCOUNTER — Other Ambulatory Visit: Payer: Self-pay | Admitting: Family Medicine

## 2013-10-08 ENCOUNTER — Ambulatory Visit (INDEPENDENT_AMBULATORY_CARE_PROVIDER_SITE_OTHER): Payer: BC Managed Care – PPO | Admitting: Family Medicine

## 2013-10-08 ENCOUNTER — Other Ambulatory Visit: Payer: Self-pay | Admitting: Family Medicine

## 2013-10-08 ENCOUNTER — Encounter: Payer: Self-pay | Admitting: Family Medicine

## 2013-10-08 VITALS — BP 124/72 | HR 94 | Temp 98.7°F | Wt 227.4 lb

## 2013-10-08 DIAGNOSIS — IMO0001 Reserved for inherently not codable concepts without codable children: Secondary | ICD-10-CM

## 2013-10-08 DIAGNOSIS — M797 Fibromyalgia: Secondary | ICD-10-CM

## 2013-10-08 DIAGNOSIS — R011 Cardiac murmur, unspecified: Secondary | ICD-10-CM

## 2013-10-08 MED ORDER — CELECOXIB 200 MG PO CAPS: ORAL_CAPSULE | ORAL | Status: DC

## 2013-10-08 NOTE — Progress Notes (Signed)
Pre visit review using our clinic review tool, if applicable. No additional management support is needed unless otherwise documented below in the visit note. 

## 2013-10-08 NOTE — Progress Notes (Signed)
   Subjective:    Patient ID: Veronica Solomon, female    DOB: 1955/10/05, 58 y.o.   MRN: [id number]  HPI    Review of Systems     Objective:   Physical Exam  Cardiovascular:  Murmur heard.         Assessment & Plan:

## 2013-10-08 NOTE — Telephone Encounter (Signed)
Rx sent to the pharmacy by e-script.//AB/CMA 

## 2013-10-08 NOTE — Progress Notes (Signed)
   Subjective:    Patient ID: Veronica Solomon, female    DOB: 02/21/1956, 58 y.o.   MRN: [id number]  HPI Pt is here to f/u disability hearing.  She was denied because the judge discounted my statement and the disability statement because the SS dr who only reviewed the paperwork and did not see the pt said she was not disabled.  According to the pt the judge , who has no medical training, does not believe in fibromyalgia.  Pt is having more pain today but the celebrex helps most days and the vicodin helps the more severe pain.      Review of Systems As above    Objective:   Physical Exam  BP 124/72  Pulse 94  Temp(Src) 98.7 F (37.1 C) (Oral)  Wt 227 lb 6.4 oz (103.148 kg)  SpO2 96% General appearance: alert, cooperative, appears stated age and no distress Neck: no adenopathy, supple, symmetrical, trachea midline and thyroid not enlarged, symmetric, no tenderness/mass/nodules Lungs: clear to auscultation bilaterally Heart: S1, S2 normal Extremities: extremities normal, atraumatic, no cyanosis or edema Neurologic: Alert and oriented X 3, normal strength and tone. Normal symmetric reflexes. Normal coordination and gait --- but with pain in legs and arms.       Assessment & Plan:  1. Fibromyalgia--- dx verified by rheumatologist Refill celebrex and con't vicodin prn - celecoxib (CELEBREX) 200 MG capsule; 1 po qd - bid prn  Dispense: 60 capsule; Refill: 5 rto for cpe in July

## 2013-10-08 NOTE — Addendum Note (Signed)
Addended by: Rosalita Chessman on: 10/08/2013 04:18 PM   Modules accepted: Orders

## 2013-10-09 ENCOUNTER — Encounter: Payer: Self-pay | Admitting: Family Medicine

## 2013-10-09 MED ORDER — ATORVASTATIN CALCIUM 10 MG PO TABS: ORAL_TABLET | ORAL | Status: DC

## 2013-10-26 ENCOUNTER — Ambulatory Visit (HOSPITAL_COMMUNITY): Payer: BC Managed Care – PPO | Attending: Cardiology | Admitting: Radiology

## 2013-10-26 DIAGNOSIS — R011 Cardiac murmur, unspecified: Secondary | ICD-10-CM | POA: Insufficient documentation

## 2013-10-26 NOTE — Progress Notes (Signed)
Echocardiogram performed.  

## 2013-12-24 ENCOUNTER — Encounter: Payer: Self-pay | Admitting: Family Medicine

## 2013-12-24 ENCOUNTER — Ambulatory Visit (INDEPENDENT_AMBULATORY_CARE_PROVIDER_SITE_OTHER): Payer: BC Managed Care – PPO | Admitting: Family Medicine

## 2013-12-24 VITALS — BP 122/70 | HR 74 | Temp 98.1°F | Ht 67.75 in | Wt 233.0 lb

## 2013-12-24 DIAGNOSIS — M797 Fibromyalgia: Secondary | ICD-10-CM

## 2013-12-24 DIAGNOSIS — I10 Essential (primary) hypertension: Secondary | ICD-10-CM

## 2013-12-24 DIAGNOSIS — Z0000 Encounter for general adult medical examination without abnormal findings: Secondary | ICD-10-CM

## 2013-12-24 DIAGNOSIS — E785 Hyperlipidemia, unspecified: Secondary | ICD-10-CM

## 2013-12-24 DIAGNOSIS — IMO0001 Reserved for inherently not codable concepts without codable children: Secondary | ICD-10-CM

## 2013-12-24 LAB — POCT URINALYSIS DIPSTICK
BILIRUBIN UA: NEGATIVE
Glucose, UA: NEGATIVE
KETONES UA: NEGATIVE
Leukocytes, UA: NEGATIVE
Nitrite, UA: NEGATIVE
PROTEIN UA: NEGATIVE
RBC UA: NEGATIVE
Spec Grav, UA: <=1.005
Urobilinogen, UA: 0.2
pH, UA: 7

## 2013-12-24 MED ORDER — ACETIC ACID 2 % OT SOLN: 4.0000 [drp] | Freq: Three times a day (TID) | OTIC | Status: DC

## 2013-12-24 NOTE — Patient Instructions (Signed)
Preventive Care for Adults A healthy lifestyle and preventive care can promote health and wellness. Preventive health guidelines for women include the following key practices.  A routine yearly physical is a good way to check with your health care provider about your health and preventive screening. It is a chance to share any concerns and updates on your health and to receive a thorough exam.  Visit your dentist for a routine exam and preventive care every 6 months. Brush your teeth twice a day and floss once a day. Good oral hygiene prevents tooth decay and gum disease.  The frequency of eye exams is based on your age, health, family medical history, use of contact lenses, and other factors. Follow your health care provider's recommendations for frequency of eye exams.  Eat a healthy diet. Foods like vegetables, fruits, whole grains, low-fat dairy products, and lean protein foods contain the nutrients you need without too many calories. Decrease your intake of foods high in solid fats, added sugars, and salt. Eat the right amount of calories for you.Get information about a proper diet from your health care provider, if necessary.  Regular physical exercise is one of the most important things you can do for your health. Most adults should get at least 150 minutes of moderate-intensity exercise (any activity that increases your heart rate and causes you to sweat) each week. In addition, most adults need muscle-strengthening exercises on 2 or more days a week.  Maintain a healthy weight. The body mass index (BMI) is a screening tool to identify possible weight problems. It provides an estimate of body fat based on height and weight. Your health care provider can find your BMI, and can help you achieve or maintain a healthy weight.For adults 20 years and older:  A BMI below 18.5 is considered underweight.  A BMI of 18.5 to 24.9 is normal.  A BMI of 25 to 29.9 is considered overweight.  A BMI of  30 and above is considered obese.  Maintain normal blood lipids and cholesterol levels by exercising and minimizing your intake of saturated fat. Eat a balanced diet with plenty of fruit and vegetables. Blood tests for lipids and cholesterol should begin at age 52 and be repeated every 5 years. If your lipid or cholesterol levels are high, you are over 50, or you are at high risk for heart disease, you may need your cholesterol levels checked more frequently.Ongoing high lipid and cholesterol levels should be treated with medicines if diet and exercise are not working.  If you smoke, find out from your health care provider how to quit. If you do not use tobacco, do not start.  Lung cancer screening is recommended for adults aged 37-80 years who are at high risk for developing lung cancer because of a history of smoking. A yearly low-dose CT scan of the lungs is recommended for people who have at least a 30-pack-year history of smoking and are a current smoker or have quit within the past 15 years. A pack year of smoking is smoking an average of 1 pack of cigarettes a day for 1 year (for example: 1 pack a day for 30 years or 2 packs a day for 15 years). Yearly screening should continue until the smoker has stopped smoking for at least 15 years. Yearly screening should be stopped for people who develop a health problem that would prevent them from having lung cancer treatment.  If you are pregnant, do not drink alcohol. If you are breastfeeding,  be very cautious about drinking alcohol. If you are not pregnant and choose to drink alcohol, do not have more than 1 drink per day. One drink is considered to be 12 ounces (355 mL) of beer, 5 ounces (148 mL) of wine, or 1.5 ounces (44 mL) of liquor.  Avoid use of street drugs. Do not share needles with anyone. Ask for help if you need support or instructions about stopping the use of drugs.  High blood pressure causes heart disease and increases the risk of  stroke. Your blood pressure should be checked at least every 1 to 2 years. Ongoing high blood pressure should be treated with medicines if weight loss and exercise do not work.  If you are 75-52 years old, ask your health care provider if you should take aspirin to prevent strokes.  Diabetes screening involves taking a blood sample to check your fasting blood sugar level. This should be done once every 3 years, after age 15, if you are within normal weight and without risk factors for diabetes. Testing should be considered at a younger age or be carried out more frequently if you are overweight and have at least 1 risk factor for diabetes.  Breast cancer screening is essential preventive care for women. You should practice "breast self-awareness." This means understanding the normal appearance and feel of your breasts and may include breast self-examination. Any changes detected, no matter how small, should be reported to a health care provider. Women in their 58s and 30s should have a clinical breast exam (CBE) by a health care provider as part of a regular health exam every 1 to 3 years. After age 16, women should have a CBE every year. Starting at age 53, women should consider having a mammogram (breast X-ray test) every year. Women who have a family history of breast cancer should talk to their health care provider about genetic screening. Women at a high risk of breast cancer should talk to their health care providers about having an MRI and a mammogram every year.  Breast cancer gene (BRCA)-related cancer risk assessment is recommended for women who have family members with BRCA-related cancers. BRCA-related cancers include breast, ovarian, tubal, and peritoneal cancers. Having family members with these cancers may be associated with an increased risk for harmful changes (mutations) in the breast cancer genes BRCA1 and BRCA2. Results of the assessment will determine the need for genetic counseling and  BRCA1 and BRCA2 testing.  Routine pelvic exams to screen for cancer are no longer recommended for nonpregnant women who are considered low risk for cancer of the pelvic organs (ovaries, uterus, and vagina) and who do not have symptoms. Ask your health care provider if a screening pelvic exam is right for you.  If you have had past treatment for cervical cancer or a condition that could lead to cancer, you need Pap tests and screening for cancer for at least 20 years after your treatment. If Pap tests have been discontinued, your risk factors (such as having a new sexual partner) need to be reassessed to determine if screening should be resumed. Some women have medical problems that increase the chance of getting cervical cancer. In these cases, your health care provider may recommend more frequent screening and Pap tests.  The HPV test is an additional test that may be used for cervical cancer screening. The HPV test looks for the virus that can cause the cell changes on the cervix. The cells collected during the Pap test can be  tested for HPV. The HPV test could be used to screen women aged 47 years and older, and should be used in women of any age who have unclear Pap test results. After the age of 36, women should have HPV testing at the same frequency as a Pap test.  Colorectal cancer can be detected and often prevented. Most routine colorectal cancer screening begins at the age of 38 years and continues through age 58 years. However, your health care provider may recommend screening at an earlier age if you have risk factors for colon cancer. On a yearly basis, your health care provider may provide home test kits to check for hidden blood in the stool. Use of a small camera at the end of a tube, to directly examine the colon (sigmoidoscopy or colonoscopy), can detect the earliest forms of colorectal cancer. Talk to your health care provider about this at age 64, when routine screening begins. Direct  exam of the colon should be repeated every 5-10 years through age 21 years, unless early forms of pre-cancerous polyps or small growths are found.  People who are at an increased risk for hepatitis B should be screened for this virus. You are considered at high risk for hepatitis B if:  You were born in a country where hepatitis B occurs often. Talk with your health care provider about which countries are considered high risk.  Your parents were born in a high-risk country and you have not received a shot to protect against hepatitis B (hepatitis B vaccine).  You have HIV or AIDS.  You use needles to inject street drugs.  You live with, or have sex with, someone who has Hepatitis B.  You get hemodialysis treatment.  You take certain medicines for conditions like cancer, organ transplantation, and autoimmune conditions.  Hepatitis C blood testing is recommended for all people born from [birth year] through 1965 and any individual with known risks for hepatitis C.  Practice safe sex. Use condoms and avoid high-risk sexual practices to reduce the spread of sexually transmitted infections (STIs). STIs include gonorrhea, chlamydia, syphilis, trichomonas, herpes, HPV, and human immunodeficiency virus (HIV). Herpes, HIV, and HPV are viral illnesses that have no cure. They can result in disability, cancer, and death.  You should be screened for sexually transmitted illnesses (STIs) including gonorrhea and chlamydia if:  You are sexually active and are younger than 24 years.  You are older than 24 years and your health care provider tells you that you are at risk for this type of infection.  Your sexual activity has changed since you were last screened and you are at an increased risk for chlamydia or gonorrhea. Ask your health care provider if you are at risk.  If you are at risk of being infected with HIV, it is recommended that you take a prescription medicine daily to prevent HIV infection. This is  called preexposure prophylaxis (PrEP). You are considered at risk if:  You are a heterosexual woman, are sexually active, and are at increased risk for HIV infection.  You take drugs by injection.  You are sexually active with a partner who has HIV.  Talk with your health care provider about whether you are at high risk of being infected with HIV. If you choose to begin PrEP, you should first be tested for HIV. You should then be tested every 3 months for as long as you are taking PrEP.  Osteoporosis is a disease in which the bones lose minerals and strength  with aging. This can result in serious bone fractures or breaks. The risk of osteoporosis can be identified using a bone density scan. Women ages 65 years and over and women at risk for fractures or osteoporosis should discuss screening with their health care providers. Ask your health care provider whether you should take a calcium supplement or vitamin D to reduce the rate of osteoporosis.  Menopause can be associated with physical symptoms and risks. Hormone replacement therapy is available to decrease symptoms and risks. You should talk to your health care provider about whether hormone replacement therapy is right for you.  Use sunscreen. Apply sunscreen liberally and repeatedly throughout the day. You should seek shade when your shadow is shorter than you. Protect yourself by wearing long sleeves, pants, a wide-brimmed hat, and sunglasses year round, whenever you are outdoors.  Once a month, do a whole body skin exam, using a mirror to look at the skin on your back. Tell your health care provider of new moles, moles that have irregular borders, moles that are larger than a pencil eraser, or moles that have changed in shape or color.  Stay current with required vaccines (immunizations).  Influenza vaccine. All adults should be immunized every year.  Tetanus, diphtheria, and acellular pertussis (Td, Tdap) vaccine. Pregnant women should  receive 1 dose of Tdap vaccine during each pregnancy. The dose should be obtained regardless of the length of time since the last dose. Immunization is preferred during the 27th-36th week of gestation. An adult who has not previously received Tdap or who does not know her vaccine status should receive 1 dose of Tdap. This initial dose should be followed by tetanus and diphtheria toxoids (Td) booster doses every 10 years. Adults with an unknown or incomplete history of completing a 3-dose immunization series with Td-containing vaccines should begin or complete a primary immunization series including a Tdap dose. Adults should receive a Td booster every 10 years.  Varicella vaccine. An adult without evidence of immunity to varicella should receive 2 doses or a second dose if she has previously received 1 dose. Pregnant females who do not have evidence of immunity should receive the first dose after pregnancy. This first dose should be obtained before leaving the health care facility. The second dose should be obtained 4-8 weeks after the first dose.  Human papillomavirus (HPV) vaccine. Females aged 13-26 years who have not received the vaccine previously should obtain the 3-dose series. The vaccine is not recommended for use in pregnant females. However, pregnancy testing is not needed before receiving a dose. If a female is found to be pregnant after receiving a dose, no treatment is needed. In that case, the remaining doses should be delayed until after the pregnancy. Immunization is recommended for any person with an immunocompromised condition through the age of 26 years if she did not get any or all doses earlier. During the 3-dose series, the second dose should be obtained 4-8 weeks after the first dose. The third dose should be obtained 24 weeks after the first dose and 16 weeks after the second dose.  Zoster vaccine. One dose is recommended for adults aged 60 years or older unless certain conditions are  present.  Measles, mumps, and rubella (MMR) vaccine. Adults born before 1957 generally are considered immune to measles and mumps. Adults born in 1957 or later should have 1 or more doses of MMR vaccine unless there is a contraindication to the vaccine or there is laboratory evidence of immunity to   each of the three diseases. A routine second dose of MMR vaccine should be obtained at least 28 days after the first dose for students attending postsecondary schools, health care workers, or international travelers. People who received inactivated measles vaccine or an unknown type of measles vaccine during 1963-1967 should receive 2 doses of MMR vaccine. People who received inactivated mumps vaccine or an unknown type of mumps vaccine before 1979 and are at high risk for mumps infection should consider immunization with 2 doses of MMR vaccine. For females of childbearing age, rubella immunity should be determined. If there is no evidence of immunity, females who are not pregnant should be vaccinated. If there is no evidence of immunity, females who are pregnant should delay immunization until after pregnancy. Unvaccinated health care workers born before 1957 who lack laboratory evidence of measles, mumps, or rubella immunity or laboratory confirmation of disease should consider measles and mumps immunization with 2 doses of MMR vaccine or rubella immunization with 1 dose of MMR vaccine.  Pneumococcal 13-valent conjugate (PCV13) vaccine. When indicated, a person who is uncertain of her immunization history and has no record of immunization should receive the PCV13 vaccine. An adult aged 19 years or older who has certain medical conditions and has not been previously immunized should receive 1 dose of PCV13 vaccine. This PCV13 should be followed with a dose of pneumococcal polysaccharide (PPSV23) vaccine. The PPSV23 vaccine dose should be obtained at least 8 weeks after the dose of PCV13 vaccine. An adult aged 19  years or older who has certain medical conditions and previously received 1 or more doses of PPSV23 vaccine should receive 1 dose of PCV13. The PCV13 vaccine dose should be obtained 1 or more years after the last PPSV23 vaccine dose.  Pneumococcal polysaccharide (PPSV23) vaccine. When PCV13 is also indicated, PCV13 should be obtained first. All adults aged 65 years and older should be immunized. An adult younger than age 65 years who has certain medical conditions should be immunized. Any person who resides in a nursing home or long-term care facility should be immunized. An adult smoker should be immunized. People with an immunocompromised condition and certain other conditions should receive both PCV13 and PPSV23 vaccines. People with human immunodeficiency virus (HIV) infection should be immunized as soon as possible after diagnosis. Immunization during chemotherapy or radiation therapy should be avoided. Routine use of PPSV23 vaccine is not recommended for American Indians, [place] Natives, or people younger than 65 years unless there are medical conditions that require PPSV23 vaccine. When indicated, people who have unknown immunization and have no record of immunization should receive PPSV23 vaccine. One-time revaccination 5 years after the first dose of PPSV23 is recommended for people aged 19-64 years who have chronic kidney failure, nephrotic syndrome, asplenia, or immunocompromised conditions. People who received 1-2 doses of PPSV23 before age 65 years should receive another dose of PPSV23 vaccine at age 65 years or later if at least 5 years have passed since the previous dose. Doses of PPSV23 are not needed for people immunized with PPSV23 at or after age 65 years.  Meningococcal vaccine. Adults with asplenia or persistent complement component deficiencies should receive 2 doses of quadrivalent meningococcal conjugate (MenACWY-D) vaccine. The doses should be obtained at least 2 months apart.  Microbiologists working with certain meningococcal bacteria, military recruits, people at risk during an outbreak, and people who travel to or live in countries with a high rate of meningitis should be immunized. A first-year college student up through age   21 years who is living in a residence hall should receive a dose if she did not receive a dose on or after her 16th birthday. Adults who have certain high-risk conditions should receive one or more doses of vaccine.  Hepatitis A vaccine. Adults who wish to be protected from this disease, have certain high-risk conditions, work with hepatitis A-infected animals, work in hepatitis A research labs, or travel to or work in countries with a high rate of hepatitis A should be immunized. Adults who were previously unvaccinated and who anticipate close contact with an international adoptee during the first 60 days after arrival in the [place] from a country with a high rate of hepatitis A should be immunized.  Hepatitis B vaccine. Adults who wish to be protected from this disease, have certain high-risk conditions, may be exposed to blood or other infectious body fluids, are household contacts or sex partners of hepatitis B positive people, are clients or workers in certain care facilities, or travel to or work in countries with a high rate of hepatitis B should be immunized.  Haemophilus influenzae type b (Hib) vaccine. A previously unvaccinated person with asplenia or sickle cell disease or having a scheduled splenectomy should receive 1 dose of Hib vaccine. Regardless of previous immunization, a recipient of a hematopoietic stem cell transplant should receive a 3-dose series 6-12 months after her successful transplant. Hib vaccine is not recommended for adults with HIV infection. Preventive Services / Frequency Ages 43 to 39years  Blood pressure check.** / Every 1 to 2 years.  Lipid and cholesterol check.** / Every 5 years beginning at age  75.  Clinical breast exam.** / Every 3 years for women in their 32s and 74s.  BRCA-related cancer risk assessment.** / For women who have family members with a BRCA-related cancer (breast, ovarian, tubal, or peritoneal cancers).  Pap test.** / Every 2 years from ages 65 through 91. Every 3 years starting at age 34 through age 93 or 72 with a history of 3 consecutive normal Pap tests.  HPV screening.** / Every 3 years from ages 46 through ages 53 to 26 with a history of 3 consecutive normal Pap tests.  Hepatitis C blood test.** / For any individual with known risks for hepatitis C.  Skin self-exam. / Monthly.  Influenza vaccine. / Every year.  Tetanus, diphtheria, and acellular pertussis (Tdap, Td) vaccine.** / Consult your health care provider. Pregnant women should receive 1 dose of Tdap vaccine during each pregnancy. 1 dose of Td every 10 years.  Varicella vaccine.** / Consult your health care provider. Pregnant females who do not have evidence of immunity should receive the first dose after pregnancy.  HPV vaccine. / 3 doses over 6 months, if 70 and younger. The vaccine is not recommended for use in pregnant females. However, pregnancy testing is not needed before receiving a dose.  Measles, mumps, rubella (MMR) vaccine.** / You need at least 1 dose of MMR if you were born in 1957 or later. You may also need a 2nd dose. For females of childbearing age, rubella immunity should be determined. If there is no evidence of immunity, females who are not pregnant should be vaccinated. If there is no evidence of immunity, females who are pregnant should delay immunization until after pregnancy.  Pneumococcal 13-valent conjugate (PCV13) vaccine.** / Consult your health care provider.  Pneumococcal polysaccharide (PPSV23) vaccine.** / 1 to 2 doses if you smoke cigarettes or if you have certain conditions.  Meningococcal vaccine.** /  1 dose if you are age 70 to 51 years and a [place] living in a residence hall, or have one of several medical conditions, you need to get vaccinated against meningococcal disease. You may also need additional booster doses.  Hepatitis A vaccine.** / Consult your health care provider.  Hepatitis B vaccine.** / Consult your health care provider.  Haemophilus influenzae type b (Hib) vaccine.** / Consult your health care provider. Ages 40 to 64years  Blood pressure check.** / Every 1 to 2 years.  Lipid and cholesterol check.** / Every 5 years beginning at age 58 years.  Lung cancer screening. / Every year if you are aged 56-80 years and have a 30-pack-year history of smoking and currently smoke or have quit within the past 15 years. Yearly screening is stopped once you have quit smoking for at least 15 years or develop a health problem that would prevent you from having lung cancer treatment.  Clinical breast exam.** / Every year after age 35 years.  BRCA-related cancer risk assessment.** / For women who have family members with a BRCA-related cancer (breast, ovarian, tubal, or peritoneal cancers).  Mammogram.** / Every year beginning at age 109 years and continuing for as long as you are in good health. Consult with your health care provider.  Pap test.** / Every 3 years starting at age 44 years through age 94 or 70 years with a history of 3 consecutive normal Pap tests.  HPV screening.** / Every 3 years from ages 109 years through ages 50 to 30 years with a history of 3 consecutive normal Pap tests.  Fecal occult blood test (FOBT) of stool. / Every year beginning at age 73 years and continuing until age 59 years. You may not need to do this test if you get a colonoscopy every 10 years.  Flexible sigmoidoscopy or colonoscopy.** / Every 5 years for a flexible sigmoidoscopy or every 10 years for a colonoscopy beginning at age 68 years and continuing until age 12 years.  Hepatitis C blood test.** / For all people born from [birth year] through  1965 and any individual with known risks for hepatitis C.  Skin self-exam. / Monthly.  Influenza vaccine. / Every year.  Tetanus, diphtheria, and acellular pertussis (Tdap/Td) vaccine.** / Consult your health care provider. Pregnant women should receive 1 dose of Tdap vaccine during each pregnancy. 1 dose of Td every 10 years.  Varicella vaccine.** / Consult your health care provider. Pregnant females who do not have evidence of immunity should receive the first dose after pregnancy.  Zoster vaccine.** / 1 dose for adults aged 2 years or older.  Measles, mumps, rubella (MMR) vaccine.** / You need at least 1 dose of MMR if you were born in 1957 or later. You may also need a 2nd dose. For females of childbearing age, rubella immunity should be determined. If there is no evidence of immunity, females who are not pregnant should be vaccinated. If there is no evidence of immunity, females who are pregnant should delay immunization until after pregnancy.  Pneumococcal 13-valent conjugate (PCV13) vaccine.** / Consult your health care provider.  Pneumococcal polysaccharide (PPSV23) vaccine.** / 1 to 2 doses if you smoke cigarettes or if you have certain conditions.  Meningococcal vaccine.** / Consult your health care provider.  Hepatitis A vaccine.** / Consult your health care provider.  Hepatitis B vaccine.** / Consult your health care provider.  Haemophilus influenzae type b (Hib) vaccine.** / Consult your health care provider. Ages 48 years  and over  Blood pressure check.** / Every 1 to 2 years.  Lipid and cholesterol check.** / Every 5 years beginning at age 84 years.  Lung cancer screening. / Every year if you are aged 50-80 years and have a 30-pack-year history of smoking and currently smoke or have quit within the past 15 years. Yearly screening is stopped once you have quit smoking for at least 15 years or develop a health problem that would prevent you from having lung cancer  treatment.  Clinical breast exam.** / Every year after age 24 years.  BRCA-related cancer risk assessment.** / For women who have family members with a BRCA-related cancer (breast, ovarian, tubal, or peritoneal cancers).  Mammogram.** / Every year beginning at age 14 years and continuing for as long as you are in good health. Consult with your health care provider.  Pap test.** / Every 3 years starting at age 17 years through age 31 or 74 years with 3 consecutive normal Pap tests. Testing can be stopped between 65 and 70 years with 3 consecutive normal Pap tests and no abnormal Pap or HPV tests in the past 10 years.  HPV screening.** / Every 3 years from ages 30 years through ages 70 or 28 years with a history of 3 consecutive normal Pap tests. Testing can be stopped between 65 and 70 years with 3 consecutive normal Pap tests and no abnormal Pap or HPV tests in the past 10 years.  Fecal occult blood test (FOBT) of stool. / Every year beginning at age 64 years and continuing until age 92 years. You may not need to do this test if you get a colonoscopy every 10 years.  Flexible sigmoidoscopy or colonoscopy.** / Every 5 years for a flexible sigmoidoscopy or every 10 years for a colonoscopy beginning at age 73 years and continuing until age 39 years.  Hepatitis C blood test.** / For all people born from [birth year] through 1964 and any individual with known risks for hepatitis C.  Osteoporosis screening.** / A one-time screening for women ages 35 years and over and women at risk for fractures or osteoporosis.  Skin self-exam. / Monthly.  Influenza vaccine. / Every year.  Tetanus, diphtheria, and acellular pertussis (Tdap/Td) vaccine.** / 1 dose of Td every 10 years.  Varicella vaccine.** / Consult your health care provider.  Zoster vaccine.** / 1 dose for adults aged 59 years or older.  Pneumococcal 13-valent conjugate (PCV13) vaccine.** / Consult your health care provider.  Pneumococcal  polysaccharide (PPSV23) vaccine.** / 1 dose for all adults aged 8 years and older.  Meningococcal vaccine.** / Consult your health care provider.  Hepatitis A vaccine.** / Consult your health care provider.  Hepatitis B vaccine.** / Consult your health care provider.  Haemophilus influenzae type b (Hib) vaccine.** / Consult your health care provider. ** Family history and personal history of risk and conditions may change your health care provider's recommendations. Document Released: 07/27/2001 Document Revised: 06/05/2013 Document Reviewed: 10/26/2010 [facility] Patient Information 2015 [place], [place]. This information is not intended to replace advice given to you by your health care provider. Make sure you discuss any questions you have with your health care provider.

## 2013-12-24 NOTE — Progress Notes (Signed)
Pre visit review using our clinic review tool, if applicable. No additional management support is needed unless otherwise documented below in the visit note. 

## 2013-12-24 NOTE — Progress Notes (Signed)
Subjective:     Veronica Solomon is a 58 y.o. female and is here for a comprehensive physical exam. The patient reports problems - myalgias-- and with inc med costs and her being on disability she is asking about changing meds.  She is also c/o neuropathy in both feet and hands.  Hands improved with inc cymbalta but feet have not.   Pt does not want to take any more meds or see any more specialists at this time.    History   Social History  . Marital Status: Single    Spouse Name: N/A    Number of Children: N/A  . Years of Education: N/A   Occupational History  . Not on file.   Social History Main Topics  . Smoking status: Former Smoker -- 0.50 packs/day    Types: Cigarettes    Quit date: 12/29/2011  . Smokeless tobacco: Never Used  . Alcohol Use: Yes  . Drug Use: No  . Sexual Activity: Not Currently   Other Topics Concern  . Not on file   Social History Narrative  . No narrative on file   Health Maintenance  Topic Date Due  . Mammogram  01/21/2011  . Pap Smear  01/12/2013  . Influenza Vaccine  01/12/2014  . Tetanus/tdap  01/09/2017  . Colonoscopy  01/31/2019    The following portions of the patient's history were reviewed and updated as appropriate:  She  has a past medical history of Hypertension; Hyperlipidemia; IBS (irritable bowel syndrome); Adenomatous colon polyp; Diverticulosis; C. difficile colitis; Allergy; and Neuromuscular disorder. She  does not have any pertinent problems on file. She  has past surgical history that includes Abdominal hysterectomy (2002). Her family history includes Breast cancer in her sister; Depression in her mother; Diabetes in her maternal grandmother; Heart attack in her mother; Hypertension in her mother and paternal grandmother; Uterine cancer in her mother. She  reports that she quit smoking about 1 years ago. Her smoking use included Cigarettes. She smoked 0.50 packs per day. She has never used smokeless tobacco. She reports  that she drinks alcohol. She reports that she does not use illicit drugs. She has a current medication list which includes the following prescription(s): amlodipine, atorvastatin, celecoxib, duloxetine, hydrocodone-acetaminophen, and hyoscyamine. Current Outpatient Prescriptions on File Prior to Visit  Medication Sig Dispense Refill  . amLODipine (NORVASC) 5 MG tablet TAKE 1 TABLET BY MOUTH EVERY DAY  30 tablet  5  . atorvastatin (LIPITOR) 10 MG tablet TAKE 1 TABLET BY MOUTH EVERY NIGHT AT BEDTIME  30 tablet  5  . celecoxib (CELEBREX) 200 MG capsule 1 po qd - bid prn  60 capsule  5  . DULoxetine (CYMBALTA) 60 MG capsule 2 po qd  60 capsule  5  . HYDROcodone-acetaminophen (NORCO/VICODIN) 5-325 MG per tablet Take 1 tablet by mouth every 6 (six) hours as needed.  30 tablet  0  . hyoscyamine (LEVSIN/SL) 0.125 MG SL tablet Place 1 tablet (0.125 mg total) under the tongue every 4 (four) hours as needed for cramping.  30 tablet  0   No current facility-administered medications on file prior to visit.   She is allergic to paregoric; penicillins; and procaine hcl..  Review of Systems Review of Systems  Constitutional: Negative for activity change, appetite change and fatigue.  HENT: Negative for hearing loss, congestion, tinnitus and ear discharge.  dentist q47m--due Eyes: Negative for visual disturbance (see optho q1y --due)  Respiratory: Negative for cough, chest tightness and shortness of  breath.   Cardiovascular: Negative for chest pain, palpitations and leg swelling.  Gastrointestinal: Negative for abdominal pain, diarrhea, constipation and abdominal distention.  Genitourinary: Negative for urgency, frequency, decreased urine volume and difficulty urinating.  Musculoskeletal: Negative for back pain, arthralgias and gait problem.  Skin: Negative for color change, pallor and rash.  Neurological: Negative for dizziness, light-headedness, numbness and headaches.  Hematological: Negative for  adenopathy. Does not bruise/bleed easily.  Psychiatric/Behavioral: Negative for suicidal ideas, confusion, sleep disturbance, self-injury, dysphoric mood, decreased concentration and agitation.      Objective:    BP 122/70  Pulse 74  Temp(Src) 98.1 F (36.7 C) (Oral)  Ht 5' 7.75" (1.721 m)  Wt 233 lb (105.688 kg)  BMI 35.68 kg/m2  SpO2 96% General appearance: alert, cooperative, appears stated age and mild distress Head: Normocephalic, without obvious abnormality, atraumatic Eyes: conjunctivae/corneas clear. PERRL, EOM's intact. Fundi benign. Ears: normal TM's and external ear canals both ears Nose: Nares normal. Septum midline. Mucosa normal. No drainage or sinus tenderness. Throat: lips, mucosa, and tongue normal; teeth and gums normal Neck: no adenopathy, supple, symmetrical, trachea midline and thyroid not enlarged, symmetric, no tenderness/mass/nodules Back: symmetric, no curvature. ROM normal. No CVA tenderness. Lungs: clear to auscultation bilaterally Breasts: normal appearance, no masses or tenderness Heart: regular rate and rhythm, S1, S2 normal, no murmur, click, rub or gallop Abdomen: soft, non-tender; bowel sounds normal; no masses,  no organomegaly Pelvic: deferred Extremities: extremities normal, atraumatic, no cyanosis or edema Pulses: 2+ and symmetric Skin: Skin color, texture, turgor normal. No rashes or lesions Lymph nodes: Cervical, supraclavicular, and axillary nodes normal. Neurologic: Alert and oriented X 3, normal strength and tone. Normal symmetric reflexes. Normal coordination and gait Psych--no new symptoms / complaints.      Assessment:    Healthy female exam.      Plan:    ghm utd Check labs See After Visit Summary for Counseling Recommendations   1. Preventative health care  - Basic metabolic panel - CBC with Differential - Hepatic function panel - Lipid panel - POCT urinalysis dipstick - TSH  2. Other and unspecified  hyperlipidemia Check labs, con't meds - Hepatic function panel - Lipid panel  3. Fibromyalgia con't meds  4. Essential hypertension con't meds, stable  Subjective:     Veronica Solomon is a 58 y.o. female and is here for a comprehensive physical exam. The patient reports {problems:16946}.  History   Social History  . Marital Status: Single    Spouse Name: N/A    Number of Children: N/A  . Years of Education: N/A   Occupational History  . Not on file.   Social History Main Topics  . Smoking status: Former Smoker -- 0.50 packs/day    Types: Cigarettes    Quit date: 12/29/2011  . Smokeless tobacco: Never Used  . Alcohol Use: Yes  . Drug Use: No  . Sexual Activity: Not Currently   Other Topics Concern  . Not on file   Social History Narrative  . No narrative on file   Health Maintenance  Topic Date Due  . Mammogram  01/21/2011  . Pap Smear  01/12/2013  . Influenza Vaccine  01/12/2014  . Tetanus/tdap  01/09/2017  . Colonoscopy  01/31/2019    {Common ambulatory SmartLinks:19316}  Review of Systems {ros; complete:30496}   Objective:    {Exam, Complete:[phone]}    Assessment:    Healthy female exam. ***     Plan:     See After Visit  Summary for Counseling Recommendations

## 2013-12-25 LAB — TSH: TSH: 0.7 u[IU]/mL (ref 0.35–4.50)

## 2013-12-25 LAB — HEPATIC FUNCTION PANEL
ALK PHOS: 101 U/L (ref 39–117)
ALT: 23 U/L (ref 0–35)
AST: 28 U/L (ref 0–37)
Albumin: 4.5 g/dL (ref 3.5–5.2)
BILIRUBIN TOTAL: 0.7 mg/dL (ref 0.2–1.2)
Bilirubin, Direct: 0.1 mg/dL (ref 0.0–0.3)
Total Protein: 7.5 g/dL (ref 6.0–8.3)

## 2013-12-25 LAB — CBC WITH DIFFERENTIAL/PLATELET
BASOS ABS: 0.1 10*3/uL (ref 0.0–0.1)
Basophils Relative: 0.6 % (ref 0.0–3.0)
EOS ABS: 0.2 10*3/uL (ref 0.0–0.7)
Eosinophils Relative: 1.9 % (ref 0.0–5.0)
HCT: 39.9 % (ref 36.0–46.0)
HEMOGLOBIN: 13.2 g/dL (ref 12.0–15.0)
LYMPHS PCT: 31 % (ref 12.0–46.0)
Lymphs Abs: 3.5 10*3/uL (ref 0.7–4.0)
MCHC: 33.1 g/dL (ref 30.0–36.0)
MCV: 87.2 fl (ref 78.0–100.0)
MONOS PCT: 6.5 % (ref 3.0–12.0)
Monocytes Absolute: 0.7 10*3/uL (ref 0.1–1.0)
Neutro Abs: 6.8 10*3/uL (ref 1.4–7.7)
Neutrophils Relative %: 60 % (ref 43.0–77.0)
PLATELETS: 309 10*3/uL (ref 150.0–400.0)
RBC: 4.58 Mil/uL (ref 3.87–5.11)
RDW: 13 % (ref 11.5–15.5)
WBC: 11.4 10*3/uL — ABNORMAL HIGH (ref 4.0–10.5)

## 2013-12-25 LAB — LIPID PANEL
CHOLESTEROL: 179 mg/dL (ref 0–200)
HDL: 62.8 mg/dL (ref >39.00)
LDL Cholesterol: 89 mg/dL (ref 0–99)
NONHDL: 116.2
Total CHOL/HDL Ratio: 3
Triglycerides: 138 mg/dL (ref 0.0–149.0)
VLDL: 27.6 mg/dL (ref 0.0–40.0)

## 2013-12-25 LAB — BASIC METABOLIC PANEL
BUN: 13 mg/dL (ref 6–23)
CALCIUM: 9.7 mg/dL (ref 8.4–10.5)
CO2: 23 meq/L (ref 19–32)
CREATININE: 0.7 mg/dL (ref 0.4–1.2)
Chloride: 103 mEq/L (ref 96–112)
GFR: 90.09 mL/min (ref >60.00)
Glucose, Bld: 84 mg/dL (ref 70–99)
Potassium: 4.3 mEq/L (ref 3.5–5.1)
SODIUM: 138 meq/L (ref 135–145)

## 2014-01-01 ENCOUNTER — Encounter: Payer: Self-pay | Admitting: Family Medicine

## 2014-01-07 ENCOUNTER — Other Ambulatory Visit: Payer: Self-pay | Admitting: Family Medicine

## 2014-01-07 ENCOUNTER — Encounter: Payer: Self-pay | Admitting: Family Medicine

## 2014-01-08 ENCOUNTER — Encounter: Payer: Self-pay | Admitting: Family Medicine

## 2014-01-10 ENCOUNTER — Encounter: Payer: Self-pay | Admitting: Family Medicine

## 2014-01-17 ENCOUNTER — Encounter: Payer: Self-pay | Admitting: Family Medicine

## 2014-01-21 ENCOUNTER — Telehealth: Payer: Self-pay

## 2014-01-21 NOTE — Telephone Encounter (Signed)
Forms dropped off by patient on 01/18/14.

## 2014-01-22 DIAGNOSIS — Z0279 Encounter for issue of other medical certificate: Secondary | ICD-10-CM

## 2014-01-24 ENCOUNTER — Encounter: Payer: Self-pay | Admitting: Family Medicine

## 2014-01-24 NOTE — Telephone Encounter (Signed)
LMOM informing the pt that we will be able to fill out the Disability form.  We will give her a call when its completed.//AB/CMA

## 2014-01-28 NOTE — Telephone Encounter (Signed)
Spoke with pt and advised that Dr. Etter Sjogren had signed the papers. Per her request originals were not faxed and placed at front desk for pick up. Copies were given to angie to process.

## 2014-01-30 NOTE — Telephone Encounter (Signed)
Disability form placed up front to go to scanning.//AB/CMA

## 2014-02-07 ENCOUNTER — Encounter: Payer: Self-pay | Admitting: Family Medicine

## 2014-02-07 ENCOUNTER — Other Ambulatory Visit: Payer: Self-pay | Admitting: Family Medicine

## 2014-02-20 ENCOUNTER — Encounter: Payer: Self-pay | Admitting: Family Medicine

## 2014-02-20 ENCOUNTER — Telehealth: Payer: Self-pay

## 2014-02-20 NOTE — Telephone Encounter (Signed)
Called patient and left a message for call back  

## 2014-02-22 NOTE — Telephone Encounter (Signed)
Form review, completed, and signed by Dr. Etter Sjogren.  Pt called and made aware that paperwork is available for pick up.  Copy made of original form.  Original form placed up front.  Copy labeled and placed in scan basket for scanning.

## 2014-02-22 NOTE — Telephone Encounter (Signed)
Pt called back.  Disability paperwork discussed.  Paperwork filled out and placed in Dr. Nonda Lou red folder for review, completion, and signature.

## 2014-02-22 NOTE — Telephone Encounter (Signed)
No answer. Left message for call back.

## 2014-02-25 ENCOUNTER — Encounter: Payer: Self-pay | Admitting: Internal Medicine

## 2014-03-09 ENCOUNTER — Other Ambulatory Visit: Payer: Self-pay | Admitting: Family Medicine

## 2014-03-09 DIAGNOSIS — M797 Fibromyalgia: Secondary | ICD-10-CM

## 2014-03-11 MED ORDER — HYDROCODONE-ACETAMINOPHEN 5-325 MG PO TABS: 1.0000 | ORAL_TABLET | Freq: Four times a day (QID) | ORAL | Status: DC | PRN

## 2014-03-11 NOTE — Telephone Encounter (Signed)
Last seen 12/24/13 and filled 07/20/13 #30. Please advise      KP

## 2014-03-12 ENCOUNTER — Encounter: Payer: Self-pay | Admitting: Family Medicine

## 2014-03-13 DIAGNOSIS — Z0279 Encounter for issue of other medical certificate: Secondary | ICD-10-CM

## 2014-03-14 ENCOUNTER — Telehealth: Payer: Self-pay | Admitting: *Deleted

## 2014-03-14 NOTE — Telephone Encounter (Signed)
Prior authorization for celecoxib (CELEBREX) 200 MG capsule initiated on [email or web address]. Awaiting next steps. JG//CMA

## 2014-03-21 NOTE — Telephone Encounter (Signed)
PA approved effective 02/27/2014 through 03/20/2015. Case ID: [id number]. Approval letter sent for scanning.

## 2014-04-22 ENCOUNTER — Encounter: Payer: Self-pay | Admitting: Family Medicine

## 2014-04-22 DIAGNOSIS — G894 Chronic pain syndrome: Secondary | ICD-10-CM

## 2014-04-22 NOTE — Telephone Encounter (Signed)
[facility] for referral --- dx chronic pain

## 2014-04-25 ENCOUNTER — Ambulatory Visit (INDEPENDENT_AMBULATORY_CARE_PROVIDER_SITE_OTHER): Payer: BC Managed Care – PPO

## 2014-04-25 ENCOUNTER — Ambulatory Visit (INDEPENDENT_AMBULATORY_CARE_PROVIDER_SITE_OTHER): Payer: BC Managed Care – PPO | Admitting: Family Medicine

## 2014-04-25 VITALS — BP 162/90 | HR 82 | Temp 98.1°F | Resp 16 | Ht 68.5 in | Wt 228.0 lb

## 2014-04-25 DIAGNOSIS — M6588 Other synovitis and tenosynovitis, other site: Secondary | ICD-10-CM

## 2014-04-25 DIAGNOSIS — M25571 Pain in right ankle and joints of right foot: Secondary | ICD-10-CM

## 2014-04-25 DIAGNOSIS — M79671 Pain in right foot: Secondary | ICD-10-CM

## 2014-04-25 DIAGNOSIS — M659 Synovitis and tenosynovitis, unspecified: Secondary | ICD-10-CM

## 2014-04-25 DIAGNOSIS — Q6651 Congenital pes planus, right foot: Secondary | ICD-10-CM

## 2014-04-25 MED ORDER — PREDNISONE 10 MG PO TABS: ORAL_TABLET | ORAL | Status: DC

## 2014-04-25 NOTE — Patient Instructions (Addendum)
Ice foot 20 minutes four times a day.  Wear brace if comfortable. Try taking the prednisone every morning and hopefully it will decrease the inflammation and your symptoms will resolve.  If you symptoms continue, I would recommend referral to the [facility]. Resume your celebrex after the prednisone is complete.  Flat Feet Having flat feet is a common condition. One foot or both might be affected. People of any age can have flat feet. In fact, everyone is born with them. But most of the time, the foot gradually develops an arch. That is the curve on the bottom of the foot that creates a gap between the foot and the ground. An arch usually develops in childhood. Sometimes, though, an arch never develops and the foot stays flat on the bottom. Other times, an arch develops but later collapses (caves in). That is what gives the condition its nickname, "fallen arches." The medical term for flat feet is pes planus. Some people have flat feet their whole life and have no problems. For others, the condition causes pain and needs to be corrected.  CAUSES   A problem with the foot's soft tissue; tendons and ligaments could be loose.  This can cause what is called flexible flat feet. That means the shape of the foot changes with pressure. When standing on the toes, a curved arch can be seen. When standing on the ground, the foot is flat.  Wear and tear. Sometimes arches simply flatten over time.  Damage to the posterior tibial tendon. This is the tendon that goes from the inside of the ankle to the bones in the middle of the foot. It is the main support for the arch. If the tendon is injured, stretched or torn, the arch might flatten.  Tarsal coalition. With this condition, two or more bones in the foot are joined together (fused ) during development in the womb. This limits movement and can lead to a flat foot. SYMPTOMS   The foot is even with the ground from toe to heel. Your caregiver will look  closely at the inside of the foot while you are standing.  Pain along the bottom of the foot. Some people describe the pain as tightness.  Swelling on the inside of the foot or ankle.  Changes in the way you walk (gait).  The feet lean inward, starting at the ankle (pronation). DIAGNOSIS  To decide if a child or adult has flat feet, a healthcare provider will probably:  Do a physical examination. This might include having the person stand on his or her toes and then stand normally. The caregiver will also hold the foot and put pressure on the foot in different directions.  Check the person's shoes. The pattern of wear on the soles can offer clues.  Order images (pictures) of the foot. They can help identify the cause of any pain. They also will show injuries to bones or tendons that could be causing the condition. The images can come from:  X-rays.  Computed tomography (CT) scan. This combines X-ray and a computer.  Magnetic resonance imaging (MRI). This uses magnets, radio waves and a computer to take a picture of the foot. It is the best technique to evaluate tendons, ligaments and muscles. TREATMENT   Flexible flat feet usually are painless. Most of the time, gait is not affected. Most children grow out of the condition. Often no treatment is needed. If there is pain, treatment options include:  Orthotics. These are inserts that  go in the shoes. They add support and shape to the feet. An orthotic is custom-made from a mold of the foot.  Shoes. Not all shoes are the same. People with flat feet need arch support. However, too much can be painful. It is important to find shoes that offer the right amount of support. Athletes, especially runners, may need to try shoes made just for people with flatter feet.  Medication. For pain, only take over-the-counter medicine for pain, discomfort, as directed by your caregiver.  Rest. If the feet start to hurt, cut back on the exercise which  increases the pain. Use common sense.  For damage to the posterior tibial tendon, options include:  Orthotics. Also adding a wedge on the inside edge may help. This can relieve pressure on the tendon.  Ankle brace, boot or cast. These supports can ease the load on the tendon while it heals.  Surgery. If the tendon is torn, it might need to be repaired.  For tarsal coalition, similar options apply:  Pain medication.  Orthotics.  A cast and crutches. This keeps weight off the foot.  Physical therapy.  Surgery to remove the bone bridge joining the two bones together. PROGNOSIS  In most people, flat feet do not cause pain or problems. People can go about their normal activities. However, if flat feet are painful, they can and should be treated. Treatment usually relieves the pain. HOME CARE INSTRUCTIONS   Take any medications prescribed by the healthcare provider. Follow the directions carefully.  Wear, or make sure a child wears, orthotics or special shoes if this was suggested. Be sure to ask how often and for how long they should be worn.  Do any exercises or therapy treatments that were suggested.  Take notes on when the pain occurs. This will help healthcare providers decide how to treat the condition.  If surgery is needed, be sure to find out if there is anything that should or should not be done before the operation. SEEK MEDICAL CARE IF:   Pain worsens in the foot or lower leg.  Pain disappears after treatment, but then returns.  Walking or simple exercise becomes difficult or causes foot pain.  Orthotics or special shoes are uncomfortable or painful. Document Released: 03/28/2009 Document Revised: 08/23/2011 Document Reviewed: 03/28/2009 [facility] Patient Information 2015 [place], [place]. This information is not intended to replace advice given to you by your health care provider. Make sure you discuss any questions you have with your health care provider.

## 2014-04-25 NOTE — Progress Notes (Addendum)
Subjective:    Patient ID: Veronica Solomon, female    DOB: 1956/04/23, 58 y.o.   MRN: [id number] This chart was scribed for Delman Cheadle, MD by Marti Sleigh, Medical Scribe. This patient was seen in Room 1 and the patient's care was started a 3:28 PM.  Chief Complaint  Patient presents with  . Foot Pain    right foot; x1 week    HPI  Past Medical History  Diagnosis Date  . Hypertension   . Hyperlipidemia   . IBS (irritable bowel syndrome)   . Adenomatous colon polyp   . Diverticulosis   . C. difficile colitis   . Allergy   . Neuromuscular disorder    Allergies  Allergen Reactions  . Paregoric     REACTION: HIVES  . Penicillins     REACTION: RASH  . Procaine Hcl    Current Outpatient Prescriptions on File Prior to Visit  Medication Sig Dispense Refill  . acetic acid (VOSOL) 2 % otic solution Place 4 drops into the right ear 3 (three) times daily. 15 mL 0  . amLODipine (NORVASC) 5 MG tablet TAKE 1 TABLET BY MOUTH EVERY DAY 30 tablet 5  . atorvastatin (LIPITOR) 10 MG tablet TAKE 1 TABLET BY MOUTH EVERY NIGHT AT BEDTIME 30 tablet 5  . celecoxib (CELEBREX) 200 MG capsule 1 po qd - bid prn 60 capsule 5  . DULoxetine (CYMBALTA) 60 MG capsule TAKE 2 CAPSULES BY MOUTH DAILY 60 capsule 5  . HYDROcodone-acetaminophen (NORCO/VICODIN) 5-325 MG per tablet Take 1 tablet by mouth every 6 (six) hours as needed. 30 tablet 0  . hyoscyamine (LEVSIN/SL) 0.125 MG SL tablet Place 1 tablet (0.125 mg total) under the tongue every 4 (four) hours as needed for cramping. 30 tablet 0   No current facility-administered medications on file prior to visit.    HPI Comments: Veronica Solomon is a 58 y.o. female with a hx of fibromyalgia who presents to [facility] complaining of constant right foot pain that started one week ago. Pt denies color change, swelling, MOI, numbness or tingling. Pt denies history of ankle sprains or strains. Pt states pain is worse with walking. Pt states the pain is equal when  she first gets up to when she goes to bed at the end of the day. Pt used a foot brace without relief. Pt states she used her fibromyalgia medication without relief of her pain.    Review of Systems  Constitutional: Positive for activity change.  Musculoskeletal: Positive for arthralgias.  Skin: Negative for color change, rash and wound.  Neurological: Negative for weakness and numbness.       Objective:  BP 162/90 mmHg  Pulse 82  Temp(Src) 98.1 F (36.7 C) (Oral)  Resp 16  Ht 5' 8.5" (1.74 m)  Wt 228 lb (103.42 kg)  BMI 34.16 kg/m2  SpO2 96%  Physical Exam  Constitutional: She is oriented to person, place, and time. She appears well-developed and well-nourished.  HENT:  Head: Normocephalic and atraumatic.  Eyes: Pupils are equal, round, and reactive to light.  Neck: Neck supple.  Cardiovascular: Normal rate and regular rhythm.   Pulmonary/Chest: Effort normal and breath sounds normal. No respiratory distress.  Musculoskeletal:  TTP on the posterior aspect of the medial malleolus. Also TTP over the medial tarsal bones directly inferior to the malleolus. No pain on the plantar aspect of the foot.  Neurological: She is alert and oriented to person, place, and time.  Skin: Skin is warm  and dry.  Psychiatric: She has a normal mood and affect. Her behavior is normal.  Nursing note and vitals reviewed.    [facility] reading (PRIMARY) by  Dr. Brigitte Pulse. Right ankle: no acute abnormality seen Right foot: no acute abnormality  Dg Ankle Complete Right  04/25/2014   CLINICAL DATA:  Right ankle pain.  No reported injury.  EXAM: RIGHT ANKLE - COMPLETE 3+ VIEW  COMPARISON:  None.  FINDINGS: Diffuse soft tissue swelling, most pronounced medially. No fracture, dislocation or effusion.  IMPRESSION: Mild diffuse soft tissue swelling, most pronounced medially. No underlying bony abnormality.   Electronically Signed   By: Enrique Sack M.D.   On: 04/25/2014 16:21   Dg Foot Complete Right  04/25/2014    CLINICAL DATA:  Right foot pain.  No known injury.  EXAM: RIGHT FOOT COMPLETE - 3+ VIEW  COMPARISON:  Right ankle radiographs obtained at the same time.  FINDINGS: There is no evidence of fracture or dislocation. There is no evidence of arthropathy or other focal bone abnormality. Soft tissues are unremarkable.  IMPRESSION: Normal examination.   Electronically Signed   By: Enrique Sack M.D.   On: 04/25/2014 16:22    Assessment & Plan:  Pain in right foot - Plan: DG Foot Complete Right  Right ankle pain - Plan: DG Ankle Complete Right Suspect this is due to underlying flat feet causing mid foot strain - recommend podiatry eval at [facility] if continues. Pt placed in air splint with sig improvement in sxs. Ice 20 min qid.  Try burst of prednisone to reduce inflammation and soft tissue sweelng and then resume chronic celebrex after taper is complete.  Meds ordered this encounter  Medications  . predniSONE (DELTASONE) 10 MG tablet    Sig: 6-5-4-3-2-1 tabs po qd    Dispense:  21 tablet    Refill:  0    I personally performed the services described in this documentation, which was scribed in my presence. The recorded information has been reviewed and considered, and addended by me as needed.  Delman Cheadle, MD MPH

## 2014-04-26 ENCOUNTER — Encounter: Payer: Self-pay | Admitting: Family Medicine

## 2014-04-29 ENCOUNTER — Other Ambulatory Visit: Payer: Self-pay | Admitting: Family Medicine

## 2014-04-29 DIAGNOSIS — M797 Fibromyalgia: Secondary | ICD-10-CM

## 2014-05-13 ENCOUNTER — Ambulatory Visit: Payer: BC Managed Care – PPO | Admitting: Rehabilitation

## 2014-05-14 ENCOUNTER — Encounter: Payer: Self-pay | Admitting: Family Medicine

## 2014-05-14 ENCOUNTER — Other Ambulatory Visit: Payer: Self-pay | Admitting: Family Medicine

## 2014-05-14 DIAGNOSIS — G894 Chronic pain syndrome: Secondary | ICD-10-CM

## 2014-05-14 DIAGNOSIS — M797 Fibromyalgia: Secondary | ICD-10-CM

## 2014-05-14 MED ORDER — HYDROCODONE-ACETAMINOPHEN 10-325 MG PO TABS: 1.0000 | ORAL_TABLET | Freq: Three times a day (TID) | ORAL | Status: DC | PRN

## 2014-05-14 MED ORDER — HYDROCODONE-ACETAMINOPHEN 5-325 MG PO TABS: 1.0000 | ORAL_TABLET | Freq: Four times a day (QID) | ORAL | Status: DC | PRN

## 2014-05-14 NOTE — Telephone Encounter (Signed)
Patient is requesting to increase this medication. Please advise     KP

## 2014-06-27 ENCOUNTER — Encounter: Payer: Self-pay | Admitting: Family Medicine

## 2014-06-27 ENCOUNTER — Ambulatory Visit (INDEPENDENT_AMBULATORY_CARE_PROVIDER_SITE_OTHER): Payer: BC Managed Care – PPO | Admitting: Family Medicine

## 2014-06-27 VITALS — BP 140/84 | HR 91 | Temp 98.2°F | Wt 231.8 lb

## 2014-06-27 DIAGNOSIS — M797 Fibromyalgia: Secondary | ICD-10-CM

## 2014-06-27 DIAGNOSIS — I10 Essential (primary) hypertension: Secondary | ICD-10-CM

## 2014-06-27 DIAGNOSIS — E785 Hyperlipidemia, unspecified: Secondary | ICD-10-CM

## 2014-06-27 LAB — LIPID PANEL
CHOL/HDL RATIO: 3
CHOLESTEROL: 155 mg/dL (ref 0–200)
HDL: 56.4 mg/dL (ref >39.00)
LDL Cholesterol: 77 mg/dL (ref 0–99)
NonHDL: 98.6
Triglycerides: 107 mg/dL (ref 0.0–149.0)
VLDL: 21.4 mg/dL (ref 0.0–40.0)

## 2014-06-27 LAB — POCT URINALYSIS DIPSTICK
BILIRUBIN UA: NEGATIVE
Blood, UA: NEGATIVE
GLUCOSE UA: NEGATIVE
Ketones, UA: NEGATIVE
LEUKOCYTES UA: NEGATIVE
Nitrite, UA: NEGATIVE
PH UA: 6
Protein, UA: NEGATIVE
Spec Grav, UA: >=1.03
Urobilinogen, UA: NEGATIVE

## 2014-06-27 LAB — CBC WITH DIFFERENTIAL/PLATELET
Basophils Absolute: 0 10*3/uL (ref 0.0–0.1)
Basophils Relative: 0.3 % (ref 0.0–3.0)
EOS ABS: 0.2 10*3/uL (ref 0.0–0.7)
EOS PCT: 1.7 % (ref 0.0–5.0)
HCT: 40.5 % (ref 36.0–46.0)
Hemoglobin: 13.3 g/dL (ref 12.0–15.0)
LYMPHS PCT: 32.8 % (ref 12.0–46.0)
Lymphs Abs: 3.2 10*3/uL (ref 0.7–4.0)
MCHC: 32.7 g/dL (ref 30.0–36.0)
MCV: 86.8 fl (ref 78.0–100.0)
MONO ABS: 0.7 10*3/uL (ref 0.1–1.0)
MONOS PCT: 7.2 % (ref 3.0–12.0)
Neutro Abs: 5.6 10*3/uL (ref 1.4–7.7)
Neutrophils Relative %: 58 % (ref 43.0–77.0)
Platelets: 301 10*3/uL (ref 150.0–400.0)
RBC: 4.66 Mil/uL (ref 3.87–5.11)
RDW: 13.5 % (ref 11.5–15.5)
WBC: 9.7 10*3/uL (ref 4.0–10.5)

## 2014-06-27 LAB — BASIC METABOLIC PANEL
BUN: 13 mg/dL (ref 6–23)
CO2: 31 meq/L (ref 19–32)
Calcium: 10.1 mg/dL (ref 8.4–10.5)
Chloride: 104 mEq/L (ref 96–112)
Creatinine, Ser: 0.75 mg/dL (ref 0.40–1.20)
GFR: 84.42 mL/min (ref >60.00)
Glucose, Bld: 100 mg/dL — ABNORMAL HIGH (ref 70–99)
POTASSIUM: 4.2 meq/L (ref 3.5–5.1)
Sodium: 139 mEq/L (ref 135–145)

## 2014-06-27 LAB — HEPATIC FUNCTION PANEL
ALK PHOS: 110 U/L (ref 39–117)
ALT: 22 U/L (ref 0–35)
AST: 21 U/L (ref 0–37)
Albumin: 4.6 g/dL (ref 3.5–5.2)
BILIRUBIN TOTAL: 0.5 mg/dL (ref 0.2–1.2)
Bilirubin, Direct: 0.1 mg/dL (ref 0.0–0.3)
Total Protein: 7.5 g/dL (ref 6.0–8.3)

## 2014-06-27 NOTE — Patient Instructions (Signed)
Fibromyalgia Fibromyalgia is a disorder that is often misunderstood. It is associated with muscular pains and tenderness that comes and goes. It is often associated with fatigue and sleep disturbances. Though it tends to be long-lasting, fibromyalgia is not life-threatening. CAUSES  The exact cause of fibromyalgia is unknown. People with certain gene types are predisposed to developing fibromyalgia and other conditions. Certain factors can play a role as triggers, such as:  Spine disorders.  Arthritis.  Severe injury (trauma) and other physical stressors.  Emotional stressors. SYMPTOMS   The main symptom is pain and stiffness in the muscles and joints, which can vary over time.  Sleep and fatigue problems. Other related symptoms may include:  Bowel and bladder problems.  Headaches.  Visual problems.  Problems with odors and noises.  Depression or mood changes.  Painful periods (dysmenorrhea).  Dryness of the skin or eyes. DIAGNOSIS  There are no specific tests for diagnosing fibromyalgia. Patients can be diagnosed accurately from the specific symptoms they have. The diagnosis is made by determining that nothing else is causing the problems. TREATMENT  There is no cure. Management includes medicines and an active, healthy lifestyle. The goal is to enhance physical fitness, decrease pain, and improve sleep. HOME CARE INSTRUCTIONS   Only take over-the-counter or prescription medicines as directed by your caregiver. Sleeping pills, tranquilizers, and pain medicines may make your problems worse.  Low-impact aerobic exercise is very important and advised for treatment. At first, it may seem to make pain worse. Gradually increasing your tolerance will overcome this feeling.  Learning relaxation techniques and how to control stress will help you. Biofeedback, visual imagery, hypnosis, muscle relaxation, yoga, and meditation are all options.  Anti-inflammatory medicines and  physical therapy may provide short-term help.  Acupuncture or massage treatments may help.  Take muscle relaxant medicines as suggested by your caregiver.  Avoid stressful situations.  Plan a healthy lifestyle. This includes your diet, sleep, rest, exercise, and friends.  Find and practice a hobby you enjoy.  Join a fibromyalgia support group for interaction, ideas, and sharing advice. This may be helpful. SEEK MEDICAL CARE IF:  You are not having good results or improvement from your treatment. FOR MORE INFORMATION  National Fibromyalgia Association: www.fmaware.org Arthritis Foundation: www.arthritis.org Document Released: 05/31/2005 Document Revised: 08/23/2011 Document Reviewed: 09/10/2009 ExitCare Patient Information 2015 [employer]. This information is not intended to replace advice given to you by your health care provider. Make sure you discuss any questions you have with your health care provider.  

## 2014-06-27 NOTE — Progress Notes (Signed)
Pre visit review using our clinic review tool, if applicable. No additional management support is needed unless otherwise documented below in the visit note. 

## 2014-06-29 NOTE — Progress Notes (Signed)
   Subjective:    Patient ID: Veronica Solomon, female    DOB: April 12, 1956, 59 y.o.   MRN: [id number]  HPI Pt  Is here to f/u bp, cholesterol and fibroyalgia.  Pt is having increased pain and is hoping to start PT in March or April.   Past Medical History  Diagnosis Date  . Hypertension   . Hyperlipidemia   . IBS (irritable bowel syndrome)   . Adenomatous colon polyp   . Diverticulosis   . C. difficile colitis   . Allergy   . Neuromuscular disorder    History   Social History  . Marital Status: Single    Spouse Name: N/A    Number of Children: N/A  . Years of Education: N/A   Occupational History  . Not on file.   Social History Main Topics  . Smoking status: Former Smoker -- 0.50 packs/day    Types: Cigarettes    Quit date: 12/29/2011  . Smokeless tobacco: Never Used  . Alcohol Use: Yes  . Drug Use: No  . Sexual Activity: Not Currently   Other Topics Concern  . Not on file   Social History Narrative   Current Outpatient Prescriptions  Medication Sig Dispense Refill  . acetic acid (VOSOL) 2 % otic solution Place 4 drops into the right ear 3 (three) times daily. 15 mL 0  . amLODipine (NORVASC) 5 MG tablet TAKE 1 TABLET BY MOUTH EVERY DAY 30 tablet 5  . atorvastatin (LIPITOR) 10 MG tablet TAKE 1 TABLET BY MOUTH EVERY NIGHT AT BEDTIME 30 tablet 5  . celecoxib (CELEBREX) 200 MG capsule 1 po qd - bid prn 60 capsule 5  . DULoxetine (CYMBALTA) 60 MG capsule TAKE 2 CAPSULES BY MOUTH DAILY 60 capsule 5  . HYDROcodone-acetaminophen (NORCO) 10-325 MG per tablet Take 1 tablet by mouth every 8 (eight) hours as needed. 30 tablet 0  . hyoscyamine (LEVSIN/SL) 0.125 MG SL tablet Place 1 tablet (0.125 mg total) under the tongue every 4 (four) hours as needed for cramping. 30 tablet 0   No current facility-administered medications for this visit.     Review of Systems  Constitutional: Negative for fever, chills, diaphoresis, activity change, appetite change, fatigue and  unexpected weight change.  Respiratory: Negative for cough, chest tightness, shortness of breath, wheezing and stridor.   Cardiovascular: Negative for chest pain and palpitations.  Musculoskeletal: Positive for myalgias and arthralgias. Negative for joint swelling and gait problem.  Psychiatric/Behavioral: Positive for dysphoric mood. Negative for suicidal ideas. The patient is not nervous/anxious.        Objective:   Physical Exam BP 140/84 mmHg  Pulse 91  Temp(Src) 98.2 F (36.8 C) (Oral)  Wt 231 lb 12.8 oz (105.144 kg)  SpO2 95% General appearance: alert, cooperative, appears stated age and no distress Neck: no adenopathy, supple, symmetrical, trachea midline and thyroid not enlarged, symmetric, no tenderness/mass/nodules Lungs: clear to auscultation bilaterally Heart: S1, S2 normal Extremities: extremities normal, atraumatic, no cyanosis or edema       Assessment & Plan:  1.Essential hypertension Stable, cont norvasc - Basic metabolic panel - CBC with Differential - Hepatic function panel - Lipid panel - POCT urinalysis dipstick  2. Hyperlipidemia con't lipitor,  Check labs - Basic metabolic panel - CBC with Differential - Hepatic function panel - Lipid panel - POCT urinalysis dipstick  3. Fibromyalgia con't pain meds Hopefully pt will be able to start PT in April or March

## 2014-06-29 NOTE — Assessment & Plan Note (Signed)
Pt is trying to get SS disability She is appealing the decision to deny her

## 2014-07-09 ENCOUNTER — Other Ambulatory Visit: Payer: Self-pay | Admitting: Family Medicine

## 2014-07-15 ENCOUNTER — Encounter: Payer: Self-pay | Admitting: Family Medicine

## 2014-08-08 ENCOUNTER — Other Ambulatory Visit: Payer: Self-pay | Admitting: Family Medicine

## 2014-10-08 ENCOUNTER — Other Ambulatory Visit: Payer: Self-pay | Admitting: Family Medicine

## 2014-11-08 ENCOUNTER — Other Ambulatory Visit: Payer: Self-pay | Admitting: Family Medicine

## 2014-11-08 ENCOUNTER — Telehealth: Payer: Self-pay | Admitting: Family Medicine

## 2014-11-08 DIAGNOSIS — G894 Chronic pain syndrome: Secondary | ICD-10-CM

## 2014-11-08 NOTE — Telephone Encounter (Signed)
Last seen 06/27/14 and filled 05/14/14 #30 UDS 06/27/14 low risk.   Please advise    KP

## 2014-11-08 NOTE — Telephone Encounter (Signed)
Relation to pt: self Call back number: [phone]  Reason for call:  Pt requesting a refill HYDROcodone-acetaminophen (NORCO) 10-325 MG per tablet

## 2014-11-08 NOTE — Telephone Encounter (Signed)
Refill x1 

## 2014-11-12 MED ORDER — HYDROCODONE-ACETAMINOPHEN 10-325 MG PO TABS: 1.0000 | ORAL_TABLET | Freq: Three times a day (TID) | ORAL | Status: DC | PRN

## 2014-11-12 NOTE — Telephone Encounter (Signed)
VM left advising Rx ready for pick up.     KP 

## 2014-12-26 ENCOUNTER — Ambulatory Visit: Payer: BC Managed Care – PPO | Admitting: Family Medicine

## 2014-12-30 ENCOUNTER — Ambulatory Visit (INDEPENDENT_AMBULATORY_CARE_PROVIDER_SITE_OTHER): Payer: BC Managed Care – PPO | Admitting: Family Medicine

## 2014-12-30 ENCOUNTER — Encounter: Payer: Self-pay | Admitting: Family Medicine

## 2014-12-30 VITALS — BP 137/83 | HR 85 | Temp 97.9°F | Resp 18 | Ht 68.0 in | Wt 232.0 lb

## 2014-12-30 DIAGNOSIS — G609 Hereditary and idiopathic neuropathy, unspecified: Secondary | ICD-10-CM | POA: Diagnosis not present

## 2014-12-30 DIAGNOSIS — E785 Hyperlipidemia, unspecified: Secondary | ICD-10-CM | POA: Insufficient documentation

## 2014-12-30 DIAGNOSIS — G894 Chronic pain syndrome: Secondary | ICD-10-CM | POA: Diagnosis not present

## 2014-12-30 DIAGNOSIS — I10 Essential (primary) hypertension: Secondary | ICD-10-CM

## 2014-12-30 LAB — POCT URINALYSIS DIPSTICK
Bilirubin, UA: NEGATIVE
Ketones, UA: NEGATIVE
Leukocytes, UA: NEGATIVE
Nitrite, UA: NEGATIVE
PH UA: 6
PROTEIN UA: NEGATIVE
RBC UA: NEGATIVE
Spec Grav, UA: 1.02
UROBILINOGEN UA: 0.2

## 2014-12-30 MED ORDER — PREGABALIN 75 MG PO CAPS: 75.0000 mg | ORAL_CAPSULE | Freq: Two times a day (BID) | ORAL | Status: DC

## 2014-12-30 MED ORDER — HYDROCODONE-ACETAMINOPHEN 10-325 MG PO TABS: 1.0000 | ORAL_TABLET | Freq: Three times a day (TID) | ORAL | Status: DC | PRN

## 2014-12-30 NOTE — Assessment & Plan Note (Signed)
CON'T NORVASC STABLE

## 2014-12-30 NOTE — Patient Instructions (Signed)

## 2014-12-30 NOTE — Progress Notes (Signed)
Pre visit review using our clinic review tool, if applicable. No additional management support is needed unless otherwise documented below in the visit note. 

## 2014-12-30 NOTE — Progress Notes (Signed)
Patient ID: Veronica Solomon, female    DOB: [birth year]  Age: 59 y.o. MRN: [id number]    Subjective:  Subjective HPI Veronica Solomon presents for f/u pain.  She states her neuropathy is worse.  She is unable to go to pain clinic secondary to co pay.    Review of Systems  Constitutional: Negative for diaphoresis, appetite change, fatigue and unexpected weight change.  Eyes: Negative for pain, redness and visual disturbance.  Respiratory: Negative for cough, chest tightness, shortness of breath and wheezing.   Cardiovascular: Negative for chest pain, palpitations and leg swelling.  Endocrine: Negative for cold intolerance, heat intolerance, polydipsia, polyphagia and polyuria.  Genitourinary: Negative for dysuria, frequency and difficulty urinating.  Neurological: Negative for dizziness, light-headedness, numbness and headaches.    History Past Medical History  Diagnosis Date  . Hypertension   . Hyperlipidemia   . IBS (irritable bowel syndrome)   . Adenomatous colon polyp   . Diverticulosis   . C. difficile colitis   . Allergy   . Neuromuscular disorder     She has past surgical history that includes Abdominal hysterectomy (2002).   Her family history includes Breast cancer in her sister; Depression in her mother; Diabetes in her maternal grandmother; Heart attack in her mother; Hypertension in her mother and paternal grandmother; Uterine cancer in her mother.She reports that she quit smoking about 3 years ago. Her smoking use included Cigarettes. She smoked 0.50 packs per day. She has never used smokeless tobacco. She reports that she drinks alcohol. She reports that she does not use illicit drugs.  Current Outpatient Prescriptions on File Prior to Visit  Medication Sig Dispense Refill  . acetic acid (VOSOL) 2 % otic solution Place 4 drops into the right ear 3 (three) times daily. 15 mL 0  . amLODipine (NORVASC) 5 MG tablet TAKE 1 TABLET BY MOUTH EVERY DAY 30 tablet 5  .  atorvastatin (LIPITOR) 10 MG tablet TAKE 1 TABLET BY MOUTH EVERY NIGHT AT BEDTIME 30 tablet 2  . celecoxib (CELEBREX) 200 MG capsule TAKE 1 CAPSULE BY MOUTH 1 TO 2 TIMES DAILY AS NEEDED 60 capsule 5  . DULoxetine (CYMBALTA) 60 MG capsule TAKE 2 CAPSULES BY MOUTH DAILY 60 capsule 5  . hyoscyamine (LEVSIN/SL) 0.125 MG SL tablet Place 1 tablet (0.125 mg total) under the tongue every 4 (four) hours as needed for cramping. 30 tablet 0   No current facility-administered medications on file prior to visit.     Objective:  Objective Physical Exam  Constitutional: She is oriented to person, place, and time. She appears well-developed and well-nourished.  HENT:  Head: Normocephalic and atraumatic.  Eyes: Conjunctivae and EOM are normal.  Neck: Normal range of motion. Neck supple. No JVD present. Carotid bruit is not present. No thyromegaly present.  Cardiovascular: Normal rate, regular rhythm and normal heart sounds.   No murmur heard. Pulmonary/Chest: Effort normal and breath sounds normal. No respiratory distress. She has no wheezes. She has no rales. She exhibits no tenderness.  Musculoskeletal: She exhibits no edema.  Neurological: She is alert and oriented to person, place, and time.  Psychiatric: She has a normal mood and affect. Her behavior is normal.   BP 137/83 mmHg  Pulse 85  Temp(Src) 97.9 F (36.6 C) (Oral)  Resp 18  Ht 5\' 8"  (1.727 m)  Wt 232 lb (105.235 kg)  BMI 35.28 kg/m2  SpO2 95% Wt Readings from Last 3 Encounters:  12/30/14 232 lb (105.235 kg)  06/27/14 231  lb 12.8 oz (105.144 kg)  04/25/14 228 lb (103.42 kg)     Lab Results  Component Value Date   WBC 9.7 06/27/2014   HGB 13.3 06/27/2014   HCT 40.5 06/27/2014   PLT 301.0 06/27/2014   GLUCOSE 100* 06/27/2014   CHOL 155 06/27/2014   TRIG 107.0 06/27/2014   HDL 56.40 06/27/2014   LDLDIRECT 195.7 01/12/2010   LDLCALC 77 06/27/2014   ALT 22 06/27/2014   AST 21 06/27/2014   NA 139 06/27/2014   K 4.2  06/27/2014   CL 104 06/27/2014   CREATININE 0.75 06/27/2014   BUN 13 06/27/2014   CO2 31 06/27/2014   TSH 0.70 12/24/2013   MICROALBUR 4.1* 11/24/2012    No results found.   Assessment & Plan:  Plan I am having Veronica Solomon start on pregabalin. I am also having her maintain her hyoscyamine, acetic acid, DULoxetine, amLODipine, atorvastatin, celecoxib, and HYDROcodone-acetaminophen.  Meds ordered this encounter  Medications  . HYDROcodone-acetaminophen (NORCO) 10-325 MG per tablet    Sig: Take 1 tablet by mouth every 8 (eight) hours as needed.    Dispense:  30 tablet    Refill:  0  . pregabalin (LYRICA) 75 MG capsule    Sig: Take 1 capsule (75 mg total) by mouth 2 (two) times daily.    Dispense:  60 capsule    Refill:  1    Problem List Items Addressed This Visit    Hyperlipidemia LDL goal <100    Check labs con't lipitor      Relevant Orders   Hepatic function panel   Lipid panel   Essential hypertension    CON'T NORVASC STABLE       Other Visit Diagnoses    Chronic pain syndrome    -  Primary    Relevant Medications    HYDROcodone-acetaminophen (NORCO) 10-325 MG per tablet    Hereditary and idiopathic peripheral neuropathy        Relevant Medications    pregabalin (LYRICA) 75 MG capsule    Other Relevant Orders    Basic metabolic panel    CBC with Differential/Platelet    Hemoglobin A1c    Hepatic function panel    Lipid panel    POCT urinalysis dipstick (Completed)    Vitamin B12    Vitamin D 1,25 dihydroxy       Follow-up: Return in about 6 months (around 07/02/2015), or if symptoms worsen or fail to improve.  Garnet Koyanagi, DO

## 2014-12-30 NOTE — Progress Notes (Deleted)
   Subjective:    Patient ID: Veronica Solomon Hence, female    DOB: 1956-05-22, 59 y.o.   MRN: [id number]  HPI    Review of Systems     Objective:   Physical Exam        Assessment & Plan:

## 2014-12-30 NOTE — Assessment & Plan Note (Signed)
Check labs con't lipitor  

## 2014-12-31 LAB — LIPID PANEL
CHOLESTEROL: 165 mg/dL (ref 0–200)
HDL: 67.2 mg/dL (ref >39.00)
LDL Cholesterol: 73 mg/dL (ref 0–99)
NonHDL: 97.8
Total CHOL/HDL Ratio: 2
Triglycerides: 123 mg/dL (ref 0.0–149.0)
VLDL: 24.6 mg/dL (ref 0.0–40.0)

## 2014-12-31 LAB — HEMOGLOBIN A1C: Hgb A1c MFr Bld: 5.8 % (ref 4.6–6.5)

## 2014-12-31 LAB — CBC WITH DIFFERENTIAL/PLATELET
Basophils Absolute: 0.1 10*3/uL (ref 0.0–0.1)
Basophils Relative: 0.5 % (ref 0.0–3.0)
EOS PCT: 1.4 % (ref 0.0–5.0)
Eosinophils Absolute: 0.1 10*3/uL (ref 0.0–0.7)
HEMATOCRIT: 41.6 % (ref 36.0–46.0)
Hemoglobin: 14 g/dL (ref 12.0–15.0)
LYMPHS ABS: 2.9 10*3/uL (ref 0.7–4.0)
LYMPHS PCT: 27.9 % (ref 12.0–46.0)
MCHC: 33.6 g/dL (ref 30.0–36.0)
MCV: 86.2 fl (ref 78.0–100.0)
Monocytes Absolute: 0.6 10*3/uL (ref 0.1–1.0)
Monocytes Relative: 5.5 % (ref 3.0–12.0)
NEUTROS ABS: 6.8 10*3/uL (ref 1.4–7.7)
NEUTROS PCT: 64.7 % (ref 43.0–77.0)
Platelets: 287 10*3/uL (ref 150.0–400.0)
RBC: 4.82 Mil/uL (ref 3.87–5.11)
RDW: 13.4 % (ref 11.5–15.5)
WBC: 10.6 10*3/uL — AB (ref 4.0–10.5)

## 2014-12-31 LAB — HEPATIC FUNCTION PANEL
ALBUMIN: 4.7 g/dL (ref 3.5–5.2)
ALT: 20 U/L (ref 0–35)
AST: 21 U/L (ref 0–37)
Alkaline Phosphatase: 100 U/L (ref 39–117)
Bilirubin, Direct: 0.1 mg/dL (ref 0.0–0.3)
TOTAL PROTEIN: 7.5 g/dL (ref 6.0–8.3)
Total Bilirubin: 0.7 mg/dL (ref 0.2–1.2)

## 2014-12-31 LAB — VITAMIN B12: VITAMIN B 12: 291 pg/mL (ref 211–911)

## 2014-12-31 LAB — BASIC METABOLIC PANEL
BUN: 15 mg/dL (ref 6–23)
CO2: 27 mEq/L (ref 19–32)
Calcium: 9.9 mg/dL (ref 8.4–10.5)
Chloride: 101 mEq/L (ref 96–112)
Creatinine, Ser: 0.8 mg/dL (ref 0.40–1.20)
GFR: 78.22 mL/min (ref >60.00)
GLUCOSE: 80 mg/dL (ref 70–99)
Potassium: 4.2 mEq/L (ref 3.5–5.1)
Sodium: 138 mEq/L (ref 135–145)

## 2015-01-02 LAB — VITAMIN D 1,25 DIHYDROXY
Vitamin D 1, 25 (OH)2 Total: 109 pg/mL — ABNORMAL HIGH (ref 18–72)
Vitamin D3 1, 25 (OH)2: 109 pg/mL

## 2015-01-03 ENCOUNTER — Encounter: Payer: Self-pay | Admitting: Family Medicine

## 2015-01-03 ENCOUNTER — Telehealth: Payer: Self-pay | Admitting: *Deleted

## 2015-01-03 NOTE — Telephone Encounter (Signed)
Prior auth for Lyrica initiated. Awaiting determination. JG//CMA

## 2015-01-06 ENCOUNTER — Other Ambulatory Visit: Payer: Self-pay | Admitting: Family Medicine

## 2015-01-09 ENCOUNTER — Encounter: Payer: Self-pay | Admitting: Family Medicine

## 2015-01-09 NOTE — Telephone Encounter (Signed)
PA approved. JG//CMA

## 2015-01-11 ENCOUNTER — Encounter: Payer: Self-pay | Admitting: Family Medicine

## 2015-01-13 ENCOUNTER — Telehealth: Payer: Self-pay | Admitting: Family Medicine

## 2015-01-13 MED ORDER — DULOXETINE HCL 60 MG PO CPEP: 120.0000 mg | ORAL_CAPSULE | Freq: Every day | ORAL | Status: DC

## 2015-01-13 NOTE — Telephone Encounter (Signed)
Routing to [employer].     KP

## 2015-01-13 NOTE — Telephone Encounter (Signed)
Caller name: Laquitha Heslin Relationship to patient: self Can be reached: [phone] [facility]: [facility] [address]  Reason for call: Pt needing refill on cymbalta. She is out of meds. Took last dose this morning. [facility] states they sent [place] a request and we didn't respond.

## 2015-01-13 NOTE — Telephone Encounter (Signed)
Rx faxed.    KP 

## 2015-02-07 ENCOUNTER — Other Ambulatory Visit: Payer: Self-pay

## 2015-02-07 MED ORDER — AMLODIPINE BESYLATE 5 MG PO TABS: 5.0000 mg | ORAL_TABLET | Freq: Every day | ORAL | Status: DC

## 2015-03-09 ENCOUNTER — Other Ambulatory Visit: Payer: Self-pay | Admitting: Family Medicine

## 2015-03-10 NOTE — Telephone Encounter (Signed)
Rx printed and signed by Dr. Etter Sjogren.  Rx sent to preferred pharmacy at [phone].  Fax confirmation received.

## 2015-04-09 ENCOUNTER — Other Ambulatory Visit: Payer: Self-pay | Admitting: Family Medicine

## 2015-04-09 ENCOUNTER — Telehealth: Payer: Self-pay | Admitting: *Deleted

## 2015-04-09 DIAGNOSIS — G894 Chronic pain syndrome: Secondary | ICD-10-CM

## 2015-04-09 MED ORDER — PREGABALIN 75 MG PO CAPS: 75.0000 mg | ORAL_CAPSULE | Freq: Two times a day (BID) | ORAL | Status: DC

## 2015-04-09 NOTE — Telephone Encounter (Signed)
Last seen and filled 12/30/14 #30   Please advise     KP

## 2015-04-09 NOTE — Telephone Encounter (Signed)
Last seen 12/30/14 and filled 03/10/15 #60   Please advise     KP

## 2015-04-09 NOTE — Addendum Note (Signed)
Addended by: Ewing Schlein on: 04/09/2015 11:38 AM   Modules accepted: Orders

## 2015-04-09 NOTE — Telephone Encounter (Signed)
PA for celecoxib 200mg  caps approved through 04/08/2016. JG//CMA

## 2015-04-10 ENCOUNTER — Encounter: Payer: Self-pay | Admitting: Family Medicine

## 2015-04-10 MED ORDER — HYDROCODONE-ACETAMINOPHEN 10-325 MG PO TABS: 1.0000 | ORAL_TABLET | Freq: Three times a day (TID) | ORAL | Status: DC | PRN

## 2015-07-03 ENCOUNTER — Ambulatory Visit (INDEPENDENT_AMBULATORY_CARE_PROVIDER_SITE_OTHER): Payer: BC Managed Care – PPO | Admitting: Family Medicine

## 2015-07-03 ENCOUNTER — Encounter: Payer: Self-pay | Admitting: Family Medicine

## 2015-07-03 VITALS — BP 130/84 | HR 78 | Temp 97.9°F | Wt 238.8 lb

## 2015-07-03 DIAGNOSIS — E538 Deficiency of other specified B group vitamins: Secondary | ICD-10-CM | POA: Diagnosis not present

## 2015-07-03 DIAGNOSIS — G894 Chronic pain syndrome: Secondary | ICD-10-CM | POA: Diagnosis not present

## 2015-07-03 DIAGNOSIS — I10 Essential (primary) hypertension: Secondary | ICD-10-CM | POA: Diagnosis not present

## 2015-07-03 DIAGNOSIS — E785 Hyperlipidemia, unspecified: Secondary | ICD-10-CM

## 2015-07-03 MED ORDER — HYDROCODONE-ACETAMINOPHEN 10-325 MG PO TABS: 1.0000 | ORAL_TABLET | Freq: Three times a day (TID) | ORAL | Status: DC | PRN

## 2015-07-03 NOTE — Patient Instructions (Signed)

## 2015-07-03 NOTE — Progress Notes (Signed)
Pre visit review using our clinic review tool, if applicable. No additional management support is needed unless otherwise documented below in the visit note. 

## 2015-07-03 NOTE — Assessment & Plan Note (Signed)
\  stable,   Current outpatient prescriptions:  .  acetic acid (VOSOL) 2 % otic solution, Place 4 drops into the right ear 3 (three) times daily., Disp: 15 mL, Rfl: 0 .  amLODipine (NORVASC) 5 MG tablet, Take 1 tablet (5 mg total) by mouth daily., Disp: 30 tablet, Rfl: 5 .  atorvastatin (LIPITOR) 10 MG tablet, TAKE 1 TABLET BY MOUTH EVERY NIGHT AT BEDTIME, Disp: 30 tablet, Rfl: 5 .  celecoxib (CELEBREX) 200 MG capsule, TAKE 1 CAPSULE BY MOUTH 1 TO 2 TIMES DAILY AS NEEDED, Disp: 60 capsule, Rfl: 5 .  DULoxetine (CYMBALTA) 60 MG capsule, Take 2 capsules (120 mg total) by mouth daily., Disp: 60 capsule, Rfl: 5 .  HYDROcodone-acetaminophen (NORCO) 10-325 MG tablet, Take 1 tablet by mouth every 8 (eight) hours as needed., Disp: 30 tablet, Rfl: 0 .  hyoscyamine (LEVSIN/SL) 0.125 MG SL tablet, Place 1 tablet (0.125 mg total) under the tongue every 4 (four) hours as needed for cramping., Disp: 30 tablet, Rfl: 0 .  pregabalin (LYRICA) 75 MG capsule, Take 1 capsule (75 mg total) by mouth 2 (two) times daily., Disp: 60 capsule, Rfl: 5

## 2015-07-03 NOTE — Progress Notes (Signed)
Patient ID: Veronica Solomon Hence, female    DOB: [birth year]  Age: 60 y.o. MRN: [id number]    Subjective:  Subjective HPI [facility] presents for f/u cholesterol, bp , depression and b12.   Pt had a Migraine 11/23 and went to Er in [place].   She was given abx, pain meds ,  Ct was done.  She was dx with sinus infection and migraine.  It was 4 more days before it went away.    Review of Systems  Constitutional: Negative for diaphoresis, appetite change, fatigue and unexpected weight change.  Eyes: Negative for pain, redness and visual disturbance.  Respiratory: Negative for cough, chest tightness, shortness of breath and wheezing.   Cardiovascular: Negative for chest pain, palpitations and leg swelling.  Endocrine: Negative for cold intolerance, heat intolerance, polydipsia, polyphagia and polyuria.  Genitourinary: Negative for dysuria, frequency and difficulty urinating.  Neurological: Negative for dizziness, light-headedness, numbness and headaches.    History Past Medical History  Diagnosis Date  . Hypertension   . Hyperlipidemia   . IBS (irritable bowel syndrome)   . Adenomatous colon polyp   . Diverticulosis   . C. difficile colitis   . Allergy   . Neuromuscular disorder [facility])     She has past surgical history that includes Abdominal hysterectomy (2002).   Her family history includes Breast cancer in her sister; Depression in her mother; Diabetes in her maternal grandmother; Heart attack in her mother; Hypertension in her mother and paternal grandmother; Uterine cancer in her mother.She reports that she quit smoking about 3 years ago. Her smoking use included Cigarettes. She smoked 0.50 packs per day. She has never used smokeless tobacco. She reports that she drinks alcohol. She reports that she does not use illicit drugs.  Current Outpatient Prescriptions on File Prior to Visit  Medication Sig Dispense Refill  . acetic acid (VOSOL) 2 % otic solution Place 4  drops into the right ear 3 (three) times daily. 15 mL 0  . amLODipine (NORVASC) 5 MG tablet Take 1 tablet (5 mg total) by mouth daily. 30 tablet 5  . atorvastatin (LIPITOR) 10 MG tablet TAKE 1 TABLET BY MOUTH EVERY NIGHT AT BEDTIME 30 tablet 5  . celecoxib (CELEBREX) 200 MG capsule TAKE 1 CAPSULE BY MOUTH 1 TO 2 TIMES DAILY AS NEEDED 60 capsule 5  . DULoxetine (CYMBALTA) 60 MG capsule Take 2 capsules (120 mg total) by mouth daily. 60 capsule 5  . hyoscyamine (LEVSIN/SL) 0.125 MG SL tablet Place 1 tablet (0.125 mg total) under the tongue every 4 (four) hours as needed for cramping. 30 tablet 0  . pregabalin (LYRICA) 75 MG capsule Take 1 capsule (75 mg total) by mouth 2 (two) times daily. 60 capsule 5   No current facility-administered medications on file prior to visit.     Objective:  Objective Physical Exam  Constitutional: She is oriented to person, place, and time. She appears well-developed and well-nourished.  HENT:  Head: Normocephalic and atraumatic.  Eyes: Conjunctivae and EOM are normal.  Neck: Normal range of motion. Neck supple. No JVD present. Carotid bruit is not present. No thyromegaly present.  Cardiovascular: Normal rate, regular rhythm and normal heart sounds.   No murmur heard. Pulmonary/Chest: Effort normal and breath sounds normal. No respiratory distress. She has no wheezes. She has no rales. She exhibits no tenderness.  Musculoskeletal: She exhibits no edema.  Neurological: She is alert and oriented to person, place, and time.  Psychiatric: She has a normal mood  and affect. Her behavior is normal. Thought content normal.  Nursing note and vitals reviewed.  BP 130/84 mmHg  Pulse 78  Temp(Src) 97.9 F (36.6 C) (Oral)  Wt 238 lb 12.8 oz (108.319 kg)  SpO2 97% Wt Readings from Last 3 Encounters:  07/03/15 238 lb 12.8 oz (108.319 kg)  12/30/14 232 lb (105.235 kg)  06/27/14 231 lb 12.8 oz (105.144 kg)     Lab Results  Component Value Date   WBC 10.6*  12/30/2014   HGB 14.0 12/30/2014   HCT 41.6 12/30/2014   PLT 287.0 12/30/2014   GLUCOSE 80 12/30/2014   CHOL 165 12/30/2014   TRIG 123.0 12/30/2014   HDL 67.20 12/30/2014   LDLDIRECT 195.7 01/12/2010   LDLCALC 73 12/30/2014   ALT 20 12/30/2014   AST 21 12/30/2014   NA 138 12/30/2014   K 4.2 12/30/2014   CL 101 12/30/2014   CREATININE 0.80 12/30/2014   BUN 15 12/30/2014   CO2 27 12/30/2014   TSH 0.70 12/24/2013   HGBA1C 5.8 12/30/2014   MICROALBUR 4.1* 11/24/2012    No results found.   Assessment & Plan:  Plan I am having Ms. Nieman maintain her hyoscyamine, acetic acid, celecoxib, atorvastatin, DULoxetine, amLODipine, pregabalin, and HYDROcodone-acetaminophen.  Meds ordered this encounter  Medications  . HYDROcodone-acetaminophen (NORCO) 10-325 MG tablet    Sig: Take 1 tablet by mouth every 8 (eight) hours as needed.    Dispense:  30 tablet    Refill:  0    Problem List Items Addressed This Visit      Unprioritized   Essential hypertension    \stable,   Current outpatient prescriptions:  .  acetic acid (VOSOL) 2 % otic solution, Place 4 drops into the right ear 3 (three) times daily., Disp: 15 mL, Rfl: 0 .  amLODipine (NORVASC) 5 MG tablet, Take 1 tablet (5 mg total) by mouth daily., Disp: 30 tablet, Rfl: 5 .  atorvastatin (LIPITOR) 10 MG tablet, TAKE 1 TABLET BY MOUTH EVERY NIGHT AT BEDTIME, Disp: 30 tablet, Rfl: 5 .  celecoxib (CELEBREX) 200 MG capsule, TAKE 1 CAPSULE BY MOUTH 1 TO 2 TIMES DAILY AS NEEDED, Disp: 60 capsule, Rfl: 5 .  DULoxetine (CYMBALTA) 60 MG capsule, Take 2 capsules (120 mg total) by mouth daily., Disp: 60 capsule, Rfl: 5 .  HYDROcodone-acetaminophen (NORCO) 10-325 MG tablet, Take 1 tablet by mouth every 8 (eight) hours as needed., Disp: 30 tablet, Rfl: 0 .  hyoscyamine (LEVSIN/SL) 0.125 MG SL tablet, Place 1 tablet (0.125 mg total) under the tongue every 4 (four) hours as needed for cramping., Disp: 30 tablet, Rfl: 0 .  pregabalin (LYRICA) 75  MG capsule, Take 1 capsule (75 mg total) by mouth 2 (two) times daily., Disp: 60 capsule, Rfl: 5       Relevant Orders   Vitamin B12   Lipid panel   CBC with Differential/Platelet   Comp Met (CMET)    Other Visit Diagnoses    Chronic pain syndrome    -  Primary    Relevant Medications    HYDROcodone-acetaminophen (NORCO) 10-325 MG tablet    Hyperlipidemia        Relevant Orders    Vitamin B12    Lipid panel    CBC with Differential/Platelet    Comp Met (CMET)    B12 deficiency        Relevant Orders    Vitamin B12       Follow-up: Return if symptoms worsen or fail to  improve.  Garnet Koyanagi, DO

## 2015-07-04 LAB — LIPID PANEL
Cholesterol: 174 mg/dL (ref 0–200)
HDL: 63.8 mg/dL (ref >39.00)
LDL Cholesterol: 90 mg/dL (ref 0–99)
NONHDL: 110.57
TRIGLYCERIDES: 102 mg/dL (ref 0.0–149.0)
Total CHOL/HDL Ratio: 3
VLDL: 20.4 mg/dL (ref 0.0–40.0)

## 2015-07-04 LAB — VITAMIN B12: VITAMIN B 12: 465 pg/mL (ref 211–911)

## 2015-07-04 LAB — CBC WITH DIFFERENTIAL/PLATELET
Basophils Absolute: 0 10*3/uL (ref 0.0–0.1)
Basophils Relative: 0.3 % (ref 0.0–3.0)
EOS PCT: 1.6 % (ref 0.0–5.0)
Eosinophils Absolute: 0.1 10*3/uL (ref 0.0–0.7)
HCT: 42.2 % (ref 36.0–46.0)
Hemoglobin: 13.8 g/dL (ref 12.0–15.0)
LYMPHS ABS: 3.1 10*3/uL (ref 0.7–4.0)
Lymphocytes Relative: 34.6 % (ref 12.0–46.0)
MCHC: 32.7 g/dL (ref 30.0–36.0)
MCV: 86.4 fl (ref 78.0–100.0)
Monocytes Absolute: 0.6 10*3/uL (ref 0.1–1.0)
Monocytes Relative: 6.5 % (ref 3.0–12.0)
NEUTROS ABS: 5.1 10*3/uL (ref 1.4–7.7)
NEUTROS PCT: 57 % (ref 43.0–77.0)
Platelets: 262 10*3/uL (ref 150.0–400.0)
RBC: 4.89 Mil/uL (ref 3.87–5.11)
RDW: 13.6 % (ref 11.5–15.5)
WBC: 8.9 10*3/uL (ref 4.0–10.5)

## 2015-07-04 LAB — COMPREHENSIVE METABOLIC PANEL
ALBUMIN: 4.5 g/dL (ref 3.5–5.2)
ALT: 23 U/L (ref 0–35)
AST: 24 U/L (ref 0–37)
Alkaline Phosphatase: 101 U/L (ref 39–117)
BUN: 13 mg/dL (ref 6–23)
CO2: 29 mEq/L (ref 19–32)
Calcium: 9.8 mg/dL (ref 8.4–10.5)
Chloride: 102 mEq/L (ref 96–112)
Creatinine, Ser: 0.71 mg/dL (ref 0.40–1.20)
GFR: 89.61 mL/min (ref >60.00)
GLUCOSE: 82 mg/dL (ref 70–99)
Potassium: 4.3 mEq/L (ref 3.5–5.1)
Sodium: 140 mEq/L (ref 135–145)
Total Bilirubin: 0.7 mg/dL (ref 0.2–1.2)
Total Protein: 7.6 g/dL (ref 6.0–8.3)

## 2015-07-10 ENCOUNTER — Other Ambulatory Visit: Payer: Self-pay | Admitting: Family Medicine

## 2015-07-10 NOTE — Telephone Encounter (Signed)
Refilled rx request cymbalta #60 with 2 refills and atorvastatin #30 with 2 refills.

## 2015-08-10 ENCOUNTER — Other Ambulatory Visit: Payer: Self-pay | Admitting: Family Medicine

## 2015-10-08 ENCOUNTER — Telehealth: Payer: Self-pay | Admitting: Family Medicine

## 2015-10-08 ENCOUNTER — Other Ambulatory Visit: Payer: Self-pay | Admitting: Family Medicine

## 2015-10-08 DIAGNOSIS — G894 Chronic pain syndrome: Secondary | ICD-10-CM

## 2015-10-08 NOTE — Telephone Encounter (Signed)
Rx printed and forwarded to [facility] to sign in PCP's absence.

## 2015-10-08 NOTE — Telephone Encounter (Signed)
Rx faxed to pharmacy at 3:54pm.

## 2015-10-09 MED ORDER — HYDROCODONE-ACETAMINOPHEN 10-325 MG PO TABS: 1.0000 | ORAL_TABLET | Freq: Three times a day (TID) | ORAL | Status: DC | PRN

## 2015-10-09 NOTE — Telephone Encounter (Signed)
Last seen and filled 07/03/15 #30  UDS 06/27/14 low risk  Please advise     KP

## 2015-10-10 NOTE — Telephone Encounter (Signed)
Dr. Charlett Blake did sign/approve refill. Called the patient left her a detailed message that her hydrocodone refill/hardcopy is ready for pickup at the front desk at our office.

## 2015-12-01 ENCOUNTER — Encounter: Payer: Self-pay | Admitting: Family Medicine

## 2015-12-01 NOTE — Telephone Encounter (Signed)
Could one of you change her apt please. Thanks    KP

## 2015-12-03 ENCOUNTER — Encounter: Payer: Self-pay | Admitting: Family Medicine

## 2015-12-03 NOTE — Telephone Encounter (Signed)
Concern added to appt notes for July Appt.

## 2015-12-04 NOTE — Telephone Encounter (Signed)
Make sure there is enough time to do that-- and it can not be with a physical

## 2015-12-04 NOTE — Telephone Encounter (Signed)
More time added to the apt.

## 2016-01-01 ENCOUNTER — Encounter: Payer: Self-pay | Admitting: Family Medicine

## 2016-01-01 ENCOUNTER — Ambulatory Visit (INDEPENDENT_AMBULATORY_CARE_PROVIDER_SITE_OTHER): Payer: BC Managed Care – PPO | Admitting: Family Medicine

## 2016-01-01 VITALS — BP 136/72 | HR 74 | Temp 98.6°F | Ht 68.0 in | Wt 236.0 lb

## 2016-01-01 DIAGNOSIS — H9209 Otalgia, unspecified ear: Secondary | ICD-10-CM

## 2016-01-01 DIAGNOSIS — R413 Other amnesia: Secondary | ICD-10-CM | POA: Diagnosis not present

## 2016-01-01 DIAGNOSIS — I10 Essential (primary) hypertension: Secondary | ICD-10-CM

## 2016-01-01 DIAGNOSIS — E785 Hyperlipidemia, unspecified: Secondary | ICD-10-CM

## 2016-01-01 DIAGNOSIS — G894 Chronic pain syndrome: Secondary | ICD-10-CM

## 2016-01-01 MED ORDER — ACETIC ACID 2 % OT SOLN: 4.0000 [drp] | Freq: Three times a day (TID) | OTIC | Status: DC

## 2016-01-01 MED ORDER — HYDROCODONE-ACETAMINOPHEN 10-325 MG PO TABS: 1.0000 | ORAL_TABLET | Freq: Three times a day (TID) | ORAL | Status: DC | PRN

## 2016-01-01 NOTE — Patient Instructions (Signed)

## 2016-01-01 NOTE — Progress Notes (Signed)
Patient ID: Veronica Solomon, female    DOB: 1955/10/26  Age: 60 y.o. MRN: [id number]    Subjective:  Subjective HPI [facility] presents for c/o memory loss that is worsening and pt states her son and daughter in law are complaining about it as well  Review of Systems  Constitutional: Negative for diaphoresis, appetite change, fatigue and unexpected weight change.  Eyes: Negative for pain, redness and visual disturbance.  Respiratory: Negative for cough, chest tightness, shortness of breath and wheezing.   Cardiovascular: Negative for chest pain, palpitations and leg swelling.  Endocrine: Negative for cold intolerance, heat intolerance, polydipsia, polyphagia and polyuria.  Genitourinary: Negative for dysuria, frequency and difficulty urinating.  Neurological: Negative for dizziness, light-headedness, numbness and headaches.  Psychiatric/Behavioral: Positive for decreased concentration. Negative for suicidal ideas, hallucinations, confusion, sleep disturbance, self-injury and dysphoric mood. The patient is not nervous/anxious and is not hyperactive.        Memory loss    History Past Medical History  Diagnosis Date  . Hypertension   . Hyperlipidemia   . IBS (irritable bowel syndrome)   . Adenomatous colon polyp   . Diverticulosis   . C. difficile colitis   . Allergy   . Neuromuscular disorder [facility])     She has past surgical history that includes Abdominal hysterectomy (2002).   Her family history includes Breast cancer in her sister; Depression in her mother; Diabetes in her maternal grandmother; Heart attack in her mother; Hypertension in her mother and paternal grandmother; Uterine cancer in her mother.She reports that she quit smoking about 4 years ago. Her smoking use included Cigarettes. She smoked 0.50 packs per day. She has never used smokeless tobacco. She reports that she drinks alcohol. She reports that she does not use illicit drugs.  Current Outpatient  Prescriptions on File Prior to Visit  Medication Sig Dispense Refill  . amLODipine (NORVASC) 5 MG tablet TAKE 1 TABLET(5 MG) BY MOUTH DAILY 30 tablet 5  . atorvastatin (LIPITOR) 10 MG tablet TAKE 1 TABLET BY MOUTH EVERY NIGHT AT BEDTIME 30 tablet 2  . celecoxib (CELEBREX) 200 MG capsule TAKE 1 CAPSULE BY MOUTH 1 TO 2 TIMES DAILY AS NEEDED 60 capsule 5  . DULoxetine (CYMBALTA) 60 MG capsule TAKE 2 CAPSULES(120 MG) BY MOUTH DAILY 60 capsule 2  . hyoscyamine (LEVSIN/SL) 0.125 MG SL tablet Place 1 tablet (0.125 mg total) under the tongue every 4 (four) hours as needed for cramping. 30 tablet 0  . LYRICA 75 MG capsule TAKE 1 CAPSULE BY MOUTH TWICE DAILY 60 capsule 2   No current facility-administered medications on file prior to visit.     Objective:  Objective Physical Exam  Constitutional: She is oriented to person, place, and time. She appears well-developed and well-nourished.  HENT:  Head: Normocephalic and atraumatic.  Eyes: Conjunctivae and EOM are normal.  Neck: Normal range of motion. Neck supple. No JVD present. Carotid bruit is not present. No thyromegaly present.  Cardiovascular: Normal rate, regular rhythm and normal heart sounds.   No murmur heard. Pulmonary/Chest: Effort normal and breath sounds normal. No respiratory distress. She has no wheezes. She has no rales. She exhibits no tenderness.  Musculoskeletal: She exhibits no edema.  Neurological: She is alert and oriented to person, place, and time.  Psychiatric: She has a normal mood and affect. Cognition and memory are impaired. She exhibits abnormal recent memory.  mmse 30/30  Nursing note and vitals reviewed.  BP 136/72 mmHg  Pulse 74  Temp(Src)  98.6 F (37 C) (Oral)  Ht 5\' 8"  (1.727 m)  Wt 236 lb (107.049 kg)  BMI 35.89 kg/m2  SpO2 97% Wt Readings from Last 3 Encounters:  01/01/16 236 lb (107.049 kg)  07/03/15 238 lb 12.8 oz (108.319 kg)  12/30/14 232 lb (105.235 kg)     Lab Results  Component Value Date     WBC 8.9 07/03/2015   HGB 13.8 07/03/2015   HCT 42.2 07/03/2015   PLT 262.0 07/03/2015   GLUCOSE 82 07/03/2015   CHOL 174 07/03/2015   TRIG 102.0 07/03/2015   HDL 63.80 07/03/2015   LDLDIRECT 195.7 01/12/2010   LDLCALC 90 07/03/2015   ALT 23 07/03/2015   AST 24 07/03/2015   NA 140 07/03/2015   K 4.3 07/03/2015   CL 102 07/03/2015   CREATININE 0.71 07/03/2015   BUN 13 07/03/2015   CO2 29 07/03/2015   TSH 0.70 12/24/2013   HGBA1C 5.8 12/30/2014   MICROALBUR 4.1* 11/24/2012    No results found.   Assessment & Plan:  Plan I am having Ms. Herschberger maintain her hyoscyamine, celecoxib, amLODipine, LYRICA, atorvastatin, DULoxetine, acetic acid, and HYDROcodone-acetaminophen.  Meds ordered this encounter  Medications  . acetic acid (VOSOL) 2 % otic solution    Sig: Place 4 drops into the right ear 3 (three) times daily.    Dispense:  15 mL    Refill:  0  . HYDROcodone-acetaminophen (NORCO) 10-325 MG tablet    Sig: Take 1 tablet by mouth every 8 (eight) hours as needed.    Dispense:  30 tablet    Refill:  0    Problem List Items Addressed This Visit      Unprioritized   Hyperlipidemia LDL goal <100   Relevant Orders   Lipid panel   Comprehensive metabolic panel   Essential hypertension   Relevant Orders   Lipid panel   Comprehensive metabolic panel    Other Visit Diagnoses    Chronic pain syndrome    -  Primary    Relevant Medications    HYDROcodone-acetaminophen (NORCO) 10-325 MG tablet    Otalgia, unspecified laterality        Relevant Medications    acetic acid (VOSOL) 2 % otic solution    Memory loss due to medical condition        Relevant Orders    Ambulatory referral to Neuropsychology       Follow-up: Return in about 6 months (around 07/03/2016) for hypertension, hyperlipidemia.  [name], DO

## 2016-01-01 NOTE — Progress Notes (Signed)
Pre visit review using our clinic review tool, if applicable. No additional management support is needed unless otherwise documented below in the visit note. 

## 2016-01-02 LAB — COMPREHENSIVE METABOLIC PANEL
ALT: 18 U/L (ref 0–35)
AST: 19 U/L (ref 0–37)
Albumin: 4.6 g/dL (ref 3.5–5.2)
Alkaline Phosphatase: 104 U/L (ref 39–117)
BUN: 18 mg/dL (ref 6–23)
CHLORIDE: 104 meq/L (ref 96–112)
CO2: 31 meq/L (ref 19–32)
CREATININE: 0.86 mg/dL (ref 0.40–1.20)
Calcium: 10.1 mg/dL (ref 8.4–10.5)
GFR: 71.7 mL/min (ref >60.00)
GLUCOSE: 95 mg/dL (ref 70–99)
POTASSIUM: 5 meq/L (ref 3.5–5.1)
SODIUM: 140 meq/L (ref 135–145)
Total Bilirubin: 0.6 mg/dL (ref 0.2–1.2)
Total Protein: 7.6 g/dL (ref 6.0–8.3)

## 2016-01-02 LAB — LIPID PANEL
CHOLESTEROL: 177 mg/dL (ref 0–200)
HDL: 54.7 mg/dL (ref >39.00)
LDL CALC: 95 mg/dL (ref 0–99)
NONHDL: 122.46
Total CHOL/HDL Ratio: 3
Triglycerides: 135 mg/dL (ref 0.0–149.0)
VLDL: 27 mg/dL (ref 0.0–40.0)

## 2016-01-08 ENCOUNTER — Ambulatory Visit: Payer: BC Managed Care – PPO | Admitting: Family Medicine

## 2016-01-10 ENCOUNTER — Other Ambulatory Visit: Payer: Self-pay | Admitting: Family

## 2016-01-11 ENCOUNTER — Encounter: Payer: Self-pay | Admitting: Family Medicine

## 2016-01-12 MED ORDER — ATORVASTATIN CALCIUM 10 MG PO TABS: 10.0000 mg | ORAL_TABLET | Freq: Every day | ORAL | 5 refills | Status: DC

## 2016-01-12 MED ORDER — PREGABALIN 75 MG PO CAPS: 75.0000 mg | ORAL_CAPSULE | Freq: Two times a day (BID) | ORAL | 5 refills | Status: DC

## 2016-01-12 MED ORDER — DULOXETINE HCL 60 MG PO CPEP: ORAL_CAPSULE | ORAL | 5 refills | Status: DC

## 2016-02-07 ENCOUNTER — Other Ambulatory Visit: Payer: Self-pay | Admitting: Family Medicine

## 2016-03-10 ENCOUNTER — Telehealth: Payer: Self-pay

## 2016-03-10 NOTE — Telephone Encounter (Signed)
Veronica Solomon (Key: [id number]) CeleBREX 200MG  capsules. 1 capsule by moth 1-2 times daily #60 for 30 days.  PA initiated and forms faxed via covermymeds. Awaiting approval.    KP

## 2016-03-11 NOTE — Telephone Encounter (Signed)
Response from [facility] and a PA is not required by the plan.     KP

## 2016-04-01 ENCOUNTER — Other Ambulatory Visit: Payer: Self-pay | Admitting: Family Medicine

## 2016-04-01 DIAGNOSIS — G894 Chronic pain syndrome: Secondary | ICD-10-CM

## 2016-04-02 MED ORDER — HYDROCODONE-ACETAMINOPHEN 10-325 MG PO TABS: 1.0000 | ORAL_TABLET | Freq: Three times a day (TID) | ORAL | 0 refills | Status: DC | PRN

## 2016-04-02 NOTE — Telephone Encounter (Signed)
See rx. 

## 2016-04-02 NOTE — Telephone Encounter (Signed)
Last seen and filled 01/01/16 #30 UDS 06/27/14 low risk   Please advise     KP

## 2016-04-05 ENCOUNTER — Encounter: Payer: Self-pay | Admitting: Family Medicine

## 2016-05-10 ENCOUNTER — Other Ambulatory Visit: Payer: Self-pay | Admitting: Family

## 2016-05-10 DIAGNOSIS — G894 Chronic pain syndrome: Secondary | ICD-10-CM

## 2016-05-10 MED ORDER — HYDROCODONE-ACETAMINOPHEN 10-325 MG PO TABS: 1.0000 | ORAL_TABLET | Freq: Three times a day (TID) | ORAL | 0 refills | Status: DC | PRN

## 2016-05-10 NOTE — Telephone Encounter (Signed)
Pt was last seen 01/01/16 Refilled 04/02/2016 #30 tablets #0 refills sent to [facility]  Please advise.  TL/CMA

## 2016-05-10 NOTE — Telephone Encounter (Signed)
Pt informed via MyChart that Rx has been placed at front desk for pick up.  

## 2016-05-10 NOTE — Telephone Encounter (Signed)
Dr. Nonda Lou patient

## 2016-06-28 ENCOUNTER — Encounter: Payer: Self-pay | Admitting: Family Medicine

## 2016-06-28 ENCOUNTER — Ambulatory Visit (INDEPENDENT_AMBULATORY_CARE_PROVIDER_SITE_OTHER): Payer: BC Managed Care – PPO | Admitting: Family Medicine

## 2016-06-28 VITALS — BP 138/82 | HR 75 | Temp 98.2°F | Resp 16 | Ht 68.8 in | Wt 238.6 lb

## 2016-06-28 DIAGNOSIS — R413 Other amnesia: Secondary | ICD-10-CM

## 2016-06-28 DIAGNOSIS — G894 Chronic pain syndrome: Secondary | ICD-10-CM | POA: Diagnosis not present

## 2016-06-28 MED ORDER — PREGABALIN 75 MG PO CAPS: 75.0000 mg | ORAL_CAPSULE | Freq: Two times a day (BID) | ORAL | 1 refills | Status: DC

## 2016-06-28 MED ORDER — DULOXETINE HCL 60 MG PO CPEP: ORAL_CAPSULE | ORAL | 1 refills | Status: DC

## 2016-06-28 MED ORDER — CELECOXIB 200 MG PO CAPS: ORAL_CAPSULE | ORAL | 1 refills | Status: DC

## 2016-06-28 MED ORDER — AMLODIPINE BESYLATE 5 MG PO TABS: ORAL_TABLET | ORAL | 1 refills | Status: DC

## 2016-06-28 MED ORDER — HYDROCODONE-ACETAMINOPHEN 10-325 MG PO TABS: 1.0000 | ORAL_TABLET | Freq: Three times a day (TID) | ORAL | 0 refills | Status: DC | PRN

## 2016-06-28 MED ORDER — ATORVASTATIN CALCIUM 10 MG PO TABS: 10.0000 mg | ORAL_TABLET | Freq: Every day | ORAL | 1 refills | Status: DC

## 2016-06-28 NOTE — Progress Notes (Signed)
Pre visit review using our clinic review tool, if applicable. No additional management support is needed unless otherwise documented below in the visit note. 

## 2016-06-28 NOTE — Progress Notes (Signed)
Patient ID: Veronica Solomon, female    DOB: 1956-04-14  Age: 61 y.o. MRN: [id number]    Subjective:  Subjective  HPI [facility] presents for bp and chronic pain.  She now has better ins and would like to finally see someone for her memory loss and pain  Management--- she never wanted to go to psych or PT--she is willing to go to pain med and neuro for further eval.   Review of Systems  Constitutional: Negative for activity change, appetite change, fatigue and unexpected weight change.  Respiratory: Negative for cough and shortness of breath.   Cardiovascular: Negative for chest pain and palpitations.  Psychiatric/Behavioral: Negative for behavioral problems and dysphoric mood. The patient is not nervous/anxious.     History Past Medical History:  Diagnosis Date  . Adenomatous colon polyp   . Allergy   . C. difficile colitis   . Diverticulosis   . Hyperlipidemia   . Hypertension   . IBS (irritable bowel syndrome)   . Neuromuscular disorder [facility])     She has a past surgical history that includes Abdominal hysterectomy (2002).   Her family history includes Breast cancer in her sister; Depression in her mother; Diabetes in her maternal grandmother; Heart attack in her mother; Hypertension in her mother and paternal grandmother; Uterine cancer in her mother.She reports that she quit smoking about 4 years ago. Her smoking use included Cigarettes. She smoked 0.50 packs per day. She has never used smokeless tobacco. She reports that she drinks alcohol. She reports that she does not use drugs.  Current Outpatient Prescriptions on File Prior to Visit  Medication Sig Dispense Refill  . hyoscyamine (LEVSIN/SL) 0.125 MG SL tablet Place 1 tablet (0.125 mg total) under the tongue every 4 (four) hours as needed for cramping. 30 tablet 0   No current facility-administered medications on file prior to visit.      Objective:  Objective  Physical Exam  Constitutional: She is  oriented to person, place, and time. She appears well-developed and well-nourished.  HENT:  Head: Normocephalic and atraumatic.  Eyes: Conjunctivae and EOM are normal.  Neck: Normal range of motion. Neck supple. No JVD present. Carotid bruit is not present. No thyromegaly present.  Cardiovascular: Normal rate, regular rhythm and normal heart sounds.   No murmur heard. Pulmonary/Chest: Effort normal and breath sounds normal. No respiratory distress. She has no wheezes. She has no rales. She exhibits no tenderness.  Musculoskeletal: She exhibits no edema.  Neurological: She is alert and oriented to person, place, and time.  Psychiatric: She has a normal mood and affect. Her behavior is normal. Judgment and thought content normal.  Nursing note and vitals reviewed.  BP 138/82   Pulse 75   Temp 98.2 F (36.8 C) (Oral)   Resp 16   Ht 5' 8.8" (1.748 m)   Wt 238 lb 9.6 oz (108.2 kg)   SpO2 95%   BMI 35.44 kg/m  Wt Readings from Last 3 Encounters:  06/28/16 238 lb 9.6 oz (108.2 kg)  01/01/16 236 lb (107 kg)  07/03/15 238 lb 12.8 oz (108.3 kg)     Lab Results  Component Value Date   WBC 8.9 07/03/2015   HGB 13.8 07/03/2015   HCT 42.2 07/03/2015   PLT 262.0 07/03/2015   GLUCOSE 95 01/01/2016   CHOL 177 01/01/2016   TRIG 135.0 01/01/2016   HDL 54.70 01/01/2016   LDLDIRECT 195.7 01/12/2010   LDLCALC 95 01/01/2016   ALT 18 01/01/2016  AST 19 01/01/2016   NA 140 01/01/2016   K 5.0 01/01/2016   CL 104 01/01/2016   CREATININE 0.86 01/01/2016   BUN 18 01/01/2016   CO2 31 01/01/2016   TSH 0.70 12/24/2013   HGBA1C 5.8 12/30/2014   MICROALBUR 4.1 (H) 11/24/2012    No results found.   Assessment & Plan:  Plan  I have discontinued Veronica Solomon's acetic acid. I am also having her maintain her hyoscyamine, HYDROcodone-acetaminophen, DULoxetine, celecoxib, amLODipine, atorvastatin, and pregabalin.  Meds ordered this encounter  Medications  . HYDROcodone-acetaminophen (NORCO)  10-325 MG tablet    Sig: Take 1 tablet by mouth every 8 (eight) hours as needed.    Dispense:  30 tablet    Refill:  0  . DULoxetine (CYMBALTA) 60 MG capsule    Sig: TAKE 2 CAPSULES(120 MG) BY MOUTH DAILY    Dispense:  180 capsule    Refill:  1  . celecoxib (CELEBREX) 200 MG capsule    Sig: TAKE 1 CAPSULE BY MOUTH 1 TO 2 TIMES DAILY AS NEEDED    Dispense:  180 capsule    Refill:  1  . amLODipine (NORVASC) 5 MG tablet    Sig: TAKE 1 TABLET(5 MG) BY MOUTH DAILY    Dispense:  90 tablet    Refill:  1  . atorvastatin (LIPITOR) 10 MG tablet    Sig: Take 1 tablet (10 mg total) by mouth at bedtime.    Dispense:  90 tablet    Refill:  1  . pregabalin (LYRICA) 75 MG capsule    Sig: Take 1 capsule (75 mg total) by mouth 2 (two) times daily.    Dispense:  90 capsule    Refill:  1    Problem List Items Addressed This Visit      Unprioritized   Chronic pain syndrome   Relevant Medications   HYDROcodone-acetaminophen (NORCO) 10-325 MG tablet   Other Relevant Orders   Ambulatory referral to Pain Clinic   Memory loss due to medical condition - Primary    Pt has been complaining for years but has been unable to go to specialist due to ins       Relevant Orders   Ambulatory referral to Neurology      Follow-up: Return in about 6 months (around 12/26/2016) for hypertension, hyperlipidemia.  Veronica Held, DO

## 2016-06-28 NOTE — Patient Instructions (Signed)

## 2016-06-29 ENCOUNTER — Encounter: Payer: Self-pay | Admitting: Family Medicine

## 2016-06-30 DIAGNOSIS — R413 Other amnesia: Secondary | ICD-10-CM | POA: Insufficient documentation

## 2016-06-30 NOTE — Assessment & Plan Note (Signed)
Pt has been complaining for years but has been unable to go to specialist due to ins

## 2016-08-10 ENCOUNTER — Other Ambulatory Visit: Payer: Self-pay | Admitting: Family Medicine

## 2016-08-22 ENCOUNTER — Other Ambulatory Visit: Payer: Self-pay | Admitting: Family Medicine

## 2016-08-23 NOTE — Telephone Encounter (Signed)
Last refill 06/28/2016 #90 with 1 refill Last office visit 06/28/2016

## 2016-08-24 NOTE — Telephone Encounter (Signed)
Faxed hardcopy for [employer] to [place]

## 2016-09-01 ENCOUNTER — Encounter: Payer: Self-pay | Admitting: Neurology

## 2016-09-01 ENCOUNTER — Ambulatory Visit (INDEPENDENT_AMBULATORY_CARE_PROVIDER_SITE_OTHER): Payer: BC Managed Care – PPO | Admitting: Neurology

## 2016-09-01 ENCOUNTER — Other Ambulatory Visit: Payer: BC Managed Care – PPO

## 2016-09-01 VITALS — BP 130/76 | HR 83 | Temp 98.2°F | Resp 16 | Ht 68.0 in | Wt 238.7 lb

## 2016-09-01 DIAGNOSIS — R413 Other amnesia: Secondary | ICD-10-CM

## 2016-09-01 DIAGNOSIS — R51 Headache: Secondary | ICD-10-CM

## 2016-09-01 DIAGNOSIS — G894 Chronic pain syndrome: Secondary | ICD-10-CM | POA: Diagnosis not present

## 2016-09-01 DIAGNOSIS — M797 Fibromyalgia: Secondary | ICD-10-CM

## 2016-09-01 DIAGNOSIS — R519 Headache, unspecified: Secondary | ICD-10-CM | POA: Insufficient documentation

## 2016-09-01 LAB — TSH: TSH: 1.48 mIU/L

## 2016-09-01 LAB — VITAMIN B12: Vitamin B-12: 385 pg/mL (ref 200–1100)

## 2016-09-01 NOTE — Progress Notes (Signed)
NEUROLOGY CONSULTATION NOTE  Veronica Solomon MRN: [id number] DOB: November 30, 1955  Referring provider: Dr. Lyndal Pulley Primary care provider: Dr. Lyndal Pulley  Reason for consult:  Memory loss  Dear Dr Cheri Rous:  Thank you for your kind referral of [facility] for consultation of the above symptoms. Although her history is well known to you, please allow me to reiterate it for the purpose of our medical record. Records and images were personally reviewed where available.  HISTORY OF PRESENT ILLNESS: This is a pleasant 61 year old right-handed woman with a history of hypertension, hyperlipidemia, fibromyalgia/chronic pain, presenting for evaluation of cognitive changes that started a number of years ago. Her son started noticing changes first, he would get frustrated that she would not remember conversations. She apparently broke a lamp in his house and does not remember how she broke it. 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 This eventually came back later. She retired in September 2010 due to fibromyalgia. She would watch a movie and not remember it later on. She has noticed word-finding difficulties for several years, which "makes me crazy," stating she is a "word nerd" and loves words, so this is frustrating for her. A few months ago she unlocked the door to her apartment, walked in, and could not recall how to get the key out of the door. She had to leave the door cracked and later came back to it. She lives alone and states she frequently forgets bill payments ("my whole life"). She has always gotten lost driving, and uses a GPS all the time. She denies missing medications, but is looking into getting a pillbox. Her father was diagnosed with Alzheimer's disease in his 25s or 42s. She denies any significant head injuries, rarely  drinks alcohol.  She reports a 1-year history of symptoms suggestive of REM behavior disorder. She would have vivid dreams where she is fighting someone, and has woken up on the floor because she fell out of bed fighting, she has punched her nightstand and broke her coffee cup. She has tremors in both hands. No anosmia. She has been having constipation recently. She has had near-daily headaches for the past few years, lasting all day, with pressure over the frontal regions. No associated nausea/vomiting, she has some photo/phonosensitivity. She recently started alternating Celebrex and CBD oil, she has found they help by decreasing the intensity of her pain. She states on a good day her overall pain level is 3/10. She was diagnosed with fibromyalgia 7 years ago and has been taking Cymbalta. Lyrica was added a few years ago. She had neck and back pain, occasional paresthesias in her extremities. She has burning pain in her right big toe. She denies any visual obscurations, diplopia, dysarthria/dysphagia, bladder dysfunction.   Laboratory Data: Lab Results  Component Value Date   TSH 0.70 12/24/2013   Lab Results  Component Value Date   [id number] 07/03/2015     PAST MEDICAL HISTORY: Past Medical History:  Diagnosis Date  . Adenomatous colon polyp   . Allergy   . C. difficile colitis   . Diverticulosis   . Hyperlipidemia   . Hypertension   . IBS (irritable bowel syndrome)   . Neuromuscular disorder ([place])     PAST SURGICAL HISTORY: Past Surgical History:  Procedure Laterality Date  . ABDOMINAL HYSTERECTOMY  2002   fibroids    MEDICATIONS: Current Outpatient Prescriptions on File Prior to Visit  Medication Sig Dispense Refill  . amLODipine (NORVASC) 5 MG tablet TAKE 1 TABLET(5 MG) BY MOUTH DAILY 90 tablet 1  . amLODipine (NORVASC) 5 MG tablet TAKE 1 TABLET(5 MG) BY MOUTH DAILY 30 tablet 0  . atorvastatin (LIPITOR) 10 MG tablet Take 1 tablet (10 mg total) by mouth at bedtime.  90 tablet 1  . celecoxib (CELEBREX) 200 MG capsule TAKE 1 CAPSULE BY MOUTH 1 TO 2 TIMES DAILY AS NEEDED 180 capsule 1  . DULoxetine (CYMBALTA) 60 MG capsule TAKE 2 CAPSULES(120 MG) BY MOUTH DAILY 180 capsule 1  . HYDROcodone-acetaminophen (NORCO) 10-325 MG tablet Take 1 tablet by mouth every 8 (eight) hours as needed. 30 tablet 0  . hyoscyamine (LEVSIN/SL) 0.125 MG SL tablet Place 1 tablet (0.125 mg total) under the tongue every 4 (four) hours as needed for cramping. 30 tablet 0  . LYRICA 75 MG capsule TAKE ONE CAPSULE BY MOUTH TWICE DAILY 90 capsule 0   No current facility-administered medications on file prior to visit.     ALLERGIES: Allergies  Allergen Reactions  . Paregoric     REACTION: HIVES  . Penicillins     REACTION: RASH  . Procaine Hcl     FAMILY HISTORY: Family History  Problem Relation Age of Onset  . Hypertension Mother   . Depression Mother   . Heart attack Mother   . Uterine cancer Mother   . Hypertension Paternal Grandmother   . Diabetes Maternal Grandmother   . Breast cancer Sister     SOCIAL HISTORY: Social History   Social History  . Marital status: Single    Spouse name: N/A  . Number of children: N/A  . Years of education: N/A   Occupational History  . Not on file.   Social History Main Topics  . Smoking status: Former Smoker    Packs/day: 0.50    Types: Cigarettes    Quit date: 12/29/2011  . Smokeless tobacco: Never Used  . Alcohol use Yes  . Drug use: No  . Sexual activity: Not Currently   Other Topics Concern  . Not on file   Social History Narrative  . No narrative on file    REVIEW OF SYSTEMS: Constitutional: No fevers, chills, or sweats, no generalized fatigue, change in appetite Eyes: No visual changes, double vision, eye pain Ear, nose and throat: No hearing loss, ear pain, nasal congestion, sore throat Cardiovascular: No chest pain, palpitations Respiratory:  No shortness of breath at rest or with exertion,  wheezes GastrointestinaI: No nausea, vomiting, diarrhea, abdominal pain, fecal incontinence Genitourinary:  No dysuria, urinary retention or frequency Musculoskeletal:  No neck pain, back pain Integumentary: No rash, pruritus, skin lesions Neurological: as above Psychiatric: No depression, insomnia, anxiety Endocrine: No palpitations, fatigue, diaphoresis, mood swings, change in appetite, change in weight, increased thirst Hematologic/Lymphatic:  No anemia, purpura, petechiae. Allergic/Immunologic: no itchy/runny eyes, nasal congestion, recent allergic reactions, rashes  PHYSICAL EXAM: Vitals:   09/01/16 1023  BP: 130/76  Pulse: 83  Resp: 16  Temp: 98.2 F (36.8 C)   General: No acute distress Head:  Normocephalic/atraumatic Eyes: Fundoscopic exam shows bilateral sharp discs, no vessel changes, exudates, or hemorrhages Neck: supple, no paraspinal tenderness, full range of motion Back: No paraspinal tenderness Heart: regular rate and rhythm Lungs: Clear to auscultation bilaterally. Vascular: No carotid bruits. Skin/Extremities: No rash, no edema Neurological Exam: Mental status: alert and oriented  to person, place, and time, no dysarthria or aphasia, Fund of knowledge is appropriate.  Recent and remote memory are intact.  Attention and concentration are normal.    Able to name objects and repeat phrases.  Montreal Cognitive Assessment  09/01/2016  Visuospatial/ Executive (0/5) 5  Naming (0/3) 3  Attention: Read list of digits (0/2) 2  Attention: Read list of letters (0/1) 1  Attention: Serial 7 subtraction starting at 100 (0/3) 3  Language: Repeat phrase (0/2) 2  Language : Fluency (0/1) 1  Abstraction (0/2) 2  Delayed Recall (0/5) 4  Orientation (0/6) 6  Total 29   Cranial nerves: CN I: not tested CN II: pupils equal, round and reactive to light, visual fields intact, fundi unremarkable. CN III, IV, VI:  full range of motion, no nystagmus, no ptosis CN V: facial  sensation intact CN VII: upper and lower face symmetric CN VIII: hearing intact to finger rub CN IX, X: gag intact, uvula midline CN XI: sternocleidomastoid and trapezius muscles intact CN XII: tongue midline Bulk & Tone: normal, no cogwheeling, no fasciculations. Motor: 5/5 throughout with no pronator drift. Sensation: decreased vibration to ankles bilaterally, otherwise intact to light touch, cold, pin, and joint position sense.  No extinction to double simultaneous stimulation.  Romberg test negative Deep Tendon Reflexes: +2 throughout except for absent ankle jerk on the right, no ankle clonus Plantar responses: downgoing bilaterally Cerebellar: no incoordination on finger to nose, heel to shin. No dysdiadochokinesia Gait: narrow-based and steady, difficulty with tandem walk (left leg seems to give her problems) Tremor: no resting tremor, mild bilateral postural and endpoint tremor  IMPRESSION: This is a pleasant 61 year old right-handed woman with a history of  hypertension, hyperlipidemia, fibromyalgia/chronic pain, presenting for evaluation of cognitive changes. She started noticing memory changes around 5 years ago. She also reports a long history of chronic daily headaches. There is a history of early onset dementia in her father (diagnosed in his 62s or 44s per patient). 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 MOCA score normal 29/30. We discussed different causes of memory loss. Check TSH and B12. MRI brain without contrast will be ordered to assess for underlying structural abnormality and assess vascular load. We discussed effects of mood and chronic pain on memory, she will be referred for Neuropsychological evaluation to further delineate memory issues. No indication to start cholinesterase inhibitors at this time. We discussed the importance of control of  vascular risk factors, physical exercise, and brain stimulation exercises for brain health. She will follow-up in 1 year and knows to call for any changes.  Thank you for allowing me to participate in the care of this patient. Please do not hesitate to call for any questions or concerns.   Ellouise Newer, M.D.  CC: Dr. Cheri Rous

## 2016-09-01 NOTE — Patient Instructions (Addendum)
1. Bloodwork TSH, B12 2. Schedule MRI brain without contrast 3. Schedule for Neuropsychological evaluation 4. Control of blood pressure, cholesterol, as well as physical exercise and brain stimulation exercises are important for brain health 5. Follow-up in 1 year, call for any changes  You have been referred for a neurocognitive evaluation in our office.   The evaluation consists of three appointments.   1. The first appointment is about 45 minutes and is a clinical interview with the neuropsychologist (Dr. Macarthur Critchley). You can bring someone with you to this appointment, as it is helpful for Dr. Si Raider to hear from both you and another adult who knows you well.   2. The second appointment is 2-3 hours long and is with the psychometrician Milana Kidney). You will complete a variety of tasks- mostly question-and-answer, some paper-and-pencil, some on the computer. There is nothing you need to do to prepare for this appointment, but having a good night's sleep prior to the testing, and bringing eyeglasses and hearing aids (if you wear them), is advised.   3. The final appointment is a follow-up with Dr. Si Raider where she will go over the test results with you and provide recommendations. This appointment is about 30 minutes.  If you would like a family member to receive this information as well, please bring them to the appointment.   We have to reserve several hours of the neuropsychologist's time and the psychometrician's time for your appointment. As such, please note that there is a No-Show fee of $100. If you are unable to attend any of your appointments, please contact our office as soon as possible to reschedule.

## 2016-09-09 ENCOUNTER — Ambulatory Visit (HOSPITAL_COMMUNITY): Payer: BC Managed Care – PPO

## 2016-09-09 ENCOUNTER — Encounter (HOSPITAL_COMMUNITY): Payer: Self-pay

## 2016-10-06 ENCOUNTER — Other Ambulatory Visit: Payer: Self-pay | Admitting: Family Medicine

## 2016-10-07 NOTE — Telephone Encounter (Signed)
Faxed hardcopy for Ly[employer] to [employer]

## 2016-10-11 ENCOUNTER — Encounter: Payer: Self-pay | Admitting: Psychology

## 2016-10-11 ENCOUNTER — Ambulatory Visit (INDEPENDENT_AMBULATORY_CARE_PROVIDER_SITE_OTHER): Payer: BC Managed Care – PPO | Admitting: Psychology

## 2016-10-11 DIAGNOSIS — R413 Other amnesia: Secondary | ICD-10-CM

## 2016-10-11 DIAGNOSIS — F329 Major depressive disorder, single episode, unspecified: Secondary | ICD-10-CM

## 2016-10-11 DIAGNOSIS — G894 Chronic pain syndrome: Secondary | ICD-10-CM

## 2016-10-11 NOTE — Progress Notes (Signed)
NEUROPSYCHOLOGICAL INTERVIEW (CPT: [id number])  Name: Veronica Solomon Date of Birth: 05-23-1956 Date of Interview: 10/11/2016  Reason for Referral:  [facility] is a 61 y.o. right handed female who is referred for neuropsychological evaluation by Dr. Ellouise Newer of [facility] due to concerns about memory loss. This patient is unaccompanied in the office for today's appointment but brings a letter from her son with observations of her cognitive decline (this will be scanned into the patient's EHR).   History of Presenting Problem:  [name] reported that she has noticed memory decline over the past 3-4 years but it was her son who insisted she see a specialist about this due to his observations of her decline. She was Dr. Delice Lesch for neurologic consultation on 09/01/2016. MoCA was 29/30. MRI of the brain was ordered but has not yet been completed.   In his letter, her son reported significant concerns about the patient's memory over the past several years. He discussed memory lapses for whole conversations or for details of conversations. She would accuse him of not talking to her in a long time, but he had call logs to disprove this. She would repeat whole stories or conversations not knowing that they had just taken place the night before.  [name] was diagnosed with fibromyalgia in 2010 and she does wonder if her cognitive decline is due to "fibro fog". She continues to have constant pain, but she feels best in the afternoon. She is concerned because she has a family history of possible early onset dementia in her father who had AD and first started showing signs in his 107s or early 72s. There is no other known family history of dementia.   The patient described several occasions where she had specific memory lapses for events/processes that were very concerning to her. These are also documented in Dr. Amparo Bristol note from 09/01/2016. In addition, the patient reported general  forgetfulness on a daily basis as well as word finding difficulty which is very frustrating to her.  Of note, the patient does have a lifelong history of symptoms suggestive of ADHD including distractibility, forgetfulness (eg missing bill payments), losing/forgetting items, high energy, always on the go, starting but not finishing tasks, and disorganization (except at work).   She denied history of head injury or concussion. She does have some difficulty with balance but has not had any falls.  Psychiatric history was denied. The only time she participated in mental health treatment was counseling 40 years ago.   She does have a remote history of substance abuse/dependence (amphetamines and cocaine >30 years ago). She has not abused any substances in the past 30 years.  Family psychiatric history is significant for severe depression, agoraphobia and hoarding disorder in her mother.   Current Functioning: [name] lives alone in an apartment. 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 She manages all complex ADLs independently. She has never had a good sense of direction and uses GPS when driving, but this is not new. She has always had some forgetfulness with paying bills. She is generally good with taking her medications but occasionally she is unsure if she took her nighttime meds. She does not miss appointments. She has some trouble cooking but this is due to fatigue, not cognitive problems.  Physically, she complains of chronic fatigue and pain. 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 She is not sure if she feels rested upon awakening because  she is in so much pain in the mornings. She recently started tramadol and this has improved sleep.  With regard to mood, she admits that she has depression at times. She endorsed fleeting suicidal ideation but denied history of suicide planning or attempt. She is not bothered by thoughts of wishing she was dead. She denies significant anxiety. She reported having a positive support system in friends and family.   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 Previously married x2. Divorced from first husband. Was separated from second husband for >20 years before he passed away. The patient has 2 children - son in [place], [place] and daughter in [place], [place]. 3 grandchildren (son has twins and daughter has stepdaughter).  Alcohol/Tobacco/Substances: Minimal/rare alcohol. Former smoker (for 42 years, quit almost 5 years ago). No current SA.   Medical History: Past Medical History:  Diagnosis Date  . Adenomatous colon polyp   . Allergy   . C. difficile colitis   . Diverticulosis   . Hyperlipidemia   . Hypertension   . IBS (irritable bowel syndrome)   . Neuromuscular disorder [facility])     Current Medications:  Outpatient Encounter Prescriptions as of 10/11/2016  Medication Sig  . amLODipine (NORVASC) 5 MG tablet TAKE 1 TABLET(5 MG) BY MOUTH DAILY  . amLODipine (NORVASC) 5 MG tablet TAKE 1 TABLET(5 MG) BY MOUTH DAILY  . atorvastatin (LIPITOR) 10 MG tablet Take 1 tablet (10 mg total) by mouth at bedtime.  . celecoxib (CELEBREX) 200 MG capsule TAKE 1 CAPSULE BY MOUTH 1 TO 2 TIMES DAILY AS NEEDED  . DULoxetine (CYMBALTA) 60 MG capsule TAKE 2 CAPSULES(120 MG) BY MOUTH DAILY  . HYDROcodone-acetaminophen (NORCO) 10-325 MG  tablet Take 1 tablet by mouth every 8 (eight) hours as needed.  . hyoscyamine (LEVSIN/SL) 0.125 MG SL tablet Place 1 tablet (0.125 mg total) under the tongue every 4 (four) hours as needed for cramping.  Marland Kitchen LYRICA 75 MG capsule TAKE ONE CAPSULE BY MOUTH TWICE DAILY   No facility-administered encounter medications on file as of 10/11/2016.      Behavioral Observations:   Appearance: Appropriately dressed and groomed Gait: Ambulated independently, no abnormalities observed Speech: Fluent; normal rate, rhythm and volume Thought process: Linear, goal directed Affect: Full, generally euthymic, did get tearful on one occasion Interpersonal: Pleasant, appropriate   TESTING: There is medical necessity to proceed with neuropsychological assessment as the results will be used to aid in differential diagnosis and clinical decision-making and to inform specific treatment recommendations. Per the patient, her son and medical records reviewed, there has been a change in cognitive functioning and a reasonable suspicion of neurocognitive disorder. After the clinical interview, the patient completed a full battery of neuropsychological testing with my psychometrician under my supervision.   PLAN: Within 1-2 weeks, Ms. Carpenito will see me for a follow-up session at which time her test performances and my impressions and treatment recommendations will be reviewed in detail.   Full neuropsychological evaluation report  to follow.

## 2016-10-11 NOTE — Progress Notes (Signed)
   Neuropsychology Note  [facility] came in today for 3 hours of neuropsychological testing with technician, Milana Kidney, BS, under the supervision of Dr. Macarthur Critchley. The patient did not appear overtly distressed by the testing session, per behavioral observation or via self-report to the technician. Rest breaks were offered. [facility] will return within 2 weeks for a feedback session with Dr. Si Raider at which time her test performances, clinical impressions and treatment recommendations will be reviewed in detail. The patient understands she can contact our office should she require our assistance before this time.  Full report to follow.

## 2016-10-21 ENCOUNTER — Encounter: Payer: Self-pay | Admitting: Family Medicine

## 2016-10-24 NOTE — Progress Notes (Signed)
NEUROPSYCHOLOGICAL EVALUATION   Name:    [facility]  Date of Birth:   09/21/1955 Date of Interview:  10/11/2016 Date of Testing:  10/11/2016   Date of Feedback:  10/25/2016       Background Information:  Reason for Referral:  [facility] is a 61 y.o. right handed female referred by Dr. Ellouise Newer to assess her current level of cognitive functioning and assist in differential diagnosis. The current evaluation consisted of a review of available medical records, an interview with the patient, and the completion of a neuropsychological testing battery. Informed consent was obtained. I also reviewed a letter the patient presented to me, from her son, with observations of her cognitive decline (this will be scanned into the patient's EHR).   History of Presenting Problem:  Ms. Maret reported that she has noticed memory decline over the past 3-4 years but it was her son who insisted she see a specialist about this due to his observations of her decline. She was Dr. Delice Lesch for neurologic consultation on 09/01/2016. MoCA was 29/30. MRI of the brain was ordered but has not yet been completed.   In his letter, her son reported significant concerns about the patient's memory over the past several years. He discussed memory lapses for whole conversations or for details of conversations. She would accuse him of not talking to her in a long time, but he had call logs to disprove this. She would repeat whole stories or conversations not knowing that they had just taken place the night before.  Ms. Grace was diagnosed with fibromyalgia in 2010 and she does wonder if her cognitive decline is due to "fibro fog". She continues to have constant pain, but she feels best in the afternoon. She is concerned because she has a family history of possible early onset dementia in her father who had AD and first started showing signs in his 4s or early 81s. There is no other known family history of  dementia.   The patient described several occasions where she had specific memory lapses for events/processes that were very concerning to her. These are also documented in Dr. Amparo Bristol note from 09/01/2016. In addition, the patient reported general forgetfulness on a daily basis as well as word finding difficulty which is very frustrating to her.  Of note, the patient does have a lifelong history of symptoms suggestive of ADHD including distractibility, forgetfulness (eg missing bill payments), losing/forgetting items, high energy, always on the go, starting but not finishing tasks, and disorganization (except at work).   She denied history of head injury or concussion. She does have some difficulty with balance but has not had any falls.  Psychiatric history was denied. The only time she participated in mental health treatment was counseling 40 years ago.   She does have a remote history of substance abuse/dependence (amphetamines and cocaine >30 years ago). She has not abused any substances in the past 30 years.  Family psychiatric history is significant for severe depression, agoraphobia and hoarding disorder in her mother.   Current Functioning: Ms. Vessey lives alone in an apartment. 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 She manages all complex ADLs independently. She has never had a good sense of direction and uses GPS when driving, but this is not new. She has always had some forgetfulness with paying bills. She  is generally good with taking her medications but occasionally she is unsure if she took her nighttime meds. She does not miss appointments. She has some trouble cooking but this is due to fatigue, not cognitive problems.  Physically, she complains of chronic fatigue and pain. 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 She is not sure if she feels rested upon awakening because she is in so much pain in the mornings. She recently started tramadol and this has improved sleep.  With regard to mood, she admits that she has depression at times. She endorsed fleeting suicidal ideation but denied history of suicide planning or attempt. She is not bothered by thoughts of wishing she was dead. She denies significant anxiety. She reported having a positive support system in friends and family.   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 Previously married x2. Divorced from first husband. Was separated from second husband for >20 years before he passed away. The patient has 2 children - son in [place], [place] and daughter in [place], [place]. 3 grandchildren (son has twins and daughter has stepdaughter).  Alcohol/Tobacco/Substances: Minimal/rare alcohol. Former smoker (for 42 years, quit almost 5 years ago). No current SA.   Medical History:  Past Medical History:  Diagnosis Date  . Adenomatous colon polyp   . Allergy   . C. difficile colitis   . Diverticulosis   . Hyperlipidemia   . Hypertension   . IBS (irritable bowel syndrome)   . Neuromuscular disorder [facility])     Current medications:  Outpatient Encounter Prescriptions as of 10/25/2016  Medication Sig  . amLODipine (NORVASC) 5 MG tablet TAKE 1 TABLET(5 MG) BY MOUTH DAILY  . amLODipine (NORVASC) 5 MG tablet TAKE 1 TABLET(5 MG) BY MOUTH DAILY  . atorvastatin (LIPITOR) 10 MG tablet Take  1 tablet (10 mg total) by mouth at bedtime.  . celecoxib (CELEBREX) 200 MG capsule TAKE 1 CAPSULE BY MOUTH 1 TO 2 TIMES DAILY AS NEEDED  . DULoxetine (CYMBALTA) 60 MG capsule TAKE 2 CAPSULES(120 MG) BY MOUTH DAILY  . HYDROcodone-acetaminophen (NORCO) 10-325 MG tablet Take 1 tablet by mouth every 8 (eight) hours as needed.  . hyoscyamine (LEVSIN/SL) 0.125 MG SL tablet Place 1 tablet (0.125 mg total) under the tongue every 4 (four) hours as needed for cramping.  Marland Kitchen LYRICA 75 MG capsule TAKE ONE CAPSULE BY MOUTH TWICE DAILY   No facility-administered encounter medications on file as of 10/25/2016.      Current Examination:  Behavioral Observations:   Appearance: Appropriately dressed and groomed Gait: Ambulated independently, no abnormalities observed Speech: Fluent; normal rate, rhythm and volume Thought process: Linear, goal directed Affect: Full, generally euthymic, did get tearful on one occasion Interpersonal: Pleasant, appropriate Orientation: Oriented to all spheres. Accurately named the current President and his predecessor.  Tests Administered: . Test of Premorbid Functioning (TOPF) . Wechsler Adult Intelligence Scale-Fourth Edition (WAIS-IV): Similarities, Block Design, Matrix Reasoning, Arithmetic, Symbol Search, Coding and Digit Span subtests . Wechsler Memory Scale-Fourth Edition (WMS-IV) Adult Version (ages 40-69): Logical Memory I, II and Recognition subtests  .  [employer] Verbal Learning Test - 2nd Edition (CVLT-2) Short Form . Conners Continuous Performance Test 3rd Edition (CPT3) . Repeatable Battery for the Assessment of Neuropsychological Status (RBANS) Form A:  Figure Copy and Figure Recall subtests and Semantic Fluency subtest . Neuropsychological Assessment Battery (NAB) Language Module, Form 1:  Naming subtest . Controlled Oral Word Association Test (COWAT) . Trail Making Test A and B . Clock Drawing Test . Olevia Bowens Depression Inventory - Second edition  (BDI-II) . Personality Assessment Inventory (PAI)  Test Results: Note: Standardized scores are presented only for use by appropriately trained professionals and to allow for any future test-retest comparison. These scores should not be interpreted without consideration of all the information that is contained in the rest of the report. The most recent standardization samples from the test publisher or other sources were used whenever possible to derive standard scores; scores were corrected for age, gender, ethnicity and education when available.   Test Scores:  Test Name Raw Score Standardized Score Descriptor  TOPF 58/70 SS= 115 High average  WAIS-IV Subtests     Similarities 33/36 ss= 15 Superior  Block Design 53/66 ss= 14 Superior  Matrix Reasoning 22/26 ss= 15 Superior  Arithmetic 18/22 ss= 13 High average  Symbol Search 41/60 ss= 15 Superior  Coding 67/135 ss= 11 Average  Digit Span 34/48 ss= 14 Superior  WAIS-IV Index Scores     Working Memory  SS= 119 High average  Processing Speed  SS= 117 High average  WMS-IV Subtests     LM I 35/50 ss= 14 Superior  LM II 32/50 ss= 14 Superior  LM II Recognition 28/30 Cum %: >75 Above average  CVLT-II Scores     Trial 1 5/9 Z= -0.5 Average  Trial 4 9/9 Z= 1 High average  Trials 1-4 total 29/36 T= 54 Average  SD Free Recall 9/9 Z= 2 Very superior  LD Free Recall 9/9 Z= 1 High average  LD Cued Recall 9/9 Z= 1 High average  Recognition Discriminability 9/9 hits, 0 false positives Z= 0.5 Average  Forced Choice Recognition 9/9  WNL  CPT3     Detectability  T= 34 Low  Omissions  T= 44 Low  Commissions  T= 38 Low  Perseverations  T= 47 Average  HRT ISI Change  T= 60 Elevated  RBANS Subtest     Figure Copy 20/20 Z= 1.1 High average  Figure Recall 18/20 Z= 1.1 High average  Semantic Fluency 17 Z= -0.9 Low average  NAB Language Naming 31/31 T= 55 Average  COWAT-FAS 54 T= 60 High average  COWAT-Animals 23 T= 61 High average  Trail Making  Test A 0 errors 32" 0 errors T= 53 Average  Trail Making Test B 0 errors 48" 0 errors T= 59 High average  Clock Drawing   WNL  BDI-II 23/63  Moderate  PAI (Only elevated clinical scales are shown here)     SOM  T= 83      Description of Test Results:  Premorbid verbal intellectual abilities were estimated to have been within the high average range based on a test of word reading. Psychomotor processing speed was high average. Auditory attention and working memory were high average. On a test of sustained visual attention, her performance was within normal limits overall. Her performance suggested possible difficulty with one dimension of attention, known as vigilance (ie, some problems with performance on trials with longer intervals between stimuli), but there were no indications of other dimensions including inattentiveness, impulsivity, or  sustained attention. Visual-spatial construction was superior. Language abilities were intact. Specifically, confrontation naming was intact with 100% accuracy, and semantic verbal fluency was high average. With regard to verbal memory, encoding and acquisition of non-contextual information (i.e., word list) was average. After a brief distracter task, free recall was very superior (9/9 items recalled). After a delay, free recall was above average (9/9 items recalled). Performance on a yes/no recognition task was intact with 100% accuracy. On another verbal memory test, encoding and acquisition of contextual auditory information (i.e., short stories) was superior. After a delay, free recall was superior. Performance on a yes/no recognition task was above average. With regard to non-verbal memory, delayed free recall of visual information was high average. Performance on tests of executive functioning were above average overall. Mental flexibility and set-shifting were high average on Trails B. Verbal fluency with phonemic search restrictions was high average.  Verbal abstract reasoning was superior. Non-verbal abstract reasoning was superior. Performance on a clock drawing task was intact.   On a self-report measure of mood (BDI-II), the patient's responses were  indicative of clinically significant depression at the present time. Symptoms endorsed included: sadness much of the time, pessimism, feelings of failure, guilty feelings, disappointment in self, self-criticalness, tearfulness, feelings of worthlessness, loss of energy, increased sleep, concentration difficulty, fatigue and reduced libido. She endorsed passive suicidal ideation but denied suicidal intention or plan.   On a more extensive measure of psychopathology and personality (PAI), symptom validity indicators fell in the normal range, suggesting that the patient answered in a reasonably forthright manner and did not attempt to present an unrealistic or inaccurate impression that was either more negative or more positive than the clinical picture would warrant. On the PAI, the patient's responses indicated an unusual degree of concern about physical functioning and health matters and probable impairment arising from somatic symptoms.  She is likely to report that her daily functioning has been compromised by numerous and varied physical problems.  She feels that her health is not as good as that of her age peers and likely believes that her health problems are complex and difficult to treat successfully.  Physical complaints are likely to include symptoms of distress in several biological systems, including the neurological, gastrointestinal, and musculoskeletal systems.  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  She is likely to be continuously concerned with her health status and physical problems.  Her social interactions and conversations tend to focus on her health problems, and her self-image may be largely  influenced by a belief that she is handicapped by her poor health. The patient also reported some difficulties consistent with relatively mild or transient depressive symptomatology.  In particular, she appears to have experienced a change in physical functioning in a manner often associated with depression.  She is likely to show a disturbance in sleep pattern, a decrease in energy and level of sexual interest, and a loss of appetite and/or weight. According to the patient's self-report, she describes NO significant problems in the following areas: unusual thoughts or peculiar experiences; antisocial behavior; problems with empathy; undue suspiciousness or hostility; extreme moodiness and impulsivity; unusually elevated mood or heightened activity; marked anxiety; problematic behaviors used to manage anxiety.  Also, she reports NO significant problems with alcohol or drug abuse or dependence.  With respect to suicidal ideation, the patient does report experiencing periodic and perhaps transient thoughts of self-harm.  She is probably pessimistic and unhappy about her  prospects for the future.  Ongoing follow-up regarding the details of her suicidal thoughts and the potential for suicidal behavior is warranted.  Clinical Impressions: Major depressive disorder, moderate. No cognitive impairment appreciated on testing. Results of cognitive testing were entirely within normal limits. There were no impaired performances and almost all performances were in the high average to superior range and consistent with estimated premorbid intellectual abilities. As such, there is no evidence to suggest the presence of a neurocognitive disorder. On psychological testing, the patient endorses symptoms consistent with a major depressive episode. Her responses also suggest that her depression may have a tendency to manifest as physiological symptoms. Overall, it appears most likely that her cognitive complaints are secondary to  depression and chronic pain.     Recommendations/Plan: Based on the findings of the present evaluation, the following recommendations are offered:  1. Education regarding the impact of depression and chronic pain on cognitive functioning will be provided. The patient will be reassured that her testing results do not indicate a neurocognitive disorder and in fact she performed quite well across all aspects of cognitive testing. These results will serve as a nice baseline for future comparison if needed.  2. Treatment for depression is indicated. I would recommend that she initiate individual psychotherapy/CBT to assist with mood and coping with chronic pain. She may also consider seeing a psychiatrist to determine if a medication change may be helpful. She is currently on Cymbalta.  3. Strategies to maintain brain health will be reviewed.    Feedback to Patient: [facility] returned for a feedback appointment on 10/25/2016 to review the results of her neuropsychological evaluation with this provider. 20 minutes face-to-face time was spent reviewing her test results, my impressions and my recommendations as detailed above.    Total time spent on this patient's case: [id number] unit for interview with psychologist; [phone] units of testing by psychometrician under psychologist's supervision; [phone] units for medical record review, scoring of neuropsychological tests, interpretation of test results, preparation of this report, and review of results to the patient by psychologist.      Thank you for your referral of [facility]. Please feel free to contact me if you have any questions or concerns regarding this report.

## 2016-10-25 ENCOUNTER — Encounter: Payer: Self-pay | Admitting: Psychology

## 2016-10-25 ENCOUNTER — Ambulatory Visit (INDEPENDENT_AMBULATORY_CARE_PROVIDER_SITE_OTHER): Payer: BC Managed Care – PPO | Admitting: Psychology

## 2016-10-25 DIAGNOSIS — R413 Other amnesia: Secondary | ICD-10-CM

## 2016-10-25 DIAGNOSIS — G894 Chronic pain syndrome: Secondary | ICD-10-CM

## 2016-10-25 DIAGNOSIS — F321 Major depressive disorder, single episode, moderate: Secondary | ICD-10-CM

## 2016-10-25 NOTE — Patient Instructions (Signed)
Fortunately, results of cognitive testing were entirely within normal limits and NOT indicative of a neurocognitive disorder. In fact, most of your performances on testing were well above average, many in the superior range!  I do think you are experiencing a rather high level of depression, likely related to chronic pain.   It is most likely the case that your cognitive symptoms in daily life are due to depression and chronic pain.  Below is some more information on this.  I am hopeful that more aggressive treatment of depression and anxiety (e.g. psychotherapy) will not only improve your mood and coping, but also result in improved cognitive functioning in your daily life.    The effect of depression and anxiety on your cognitive functioning: . One of the typical symptoms of depression is difficulty concentrating and making decisions, and various types of anxiety also interfere with attention and concentration . Problems with attention and concentration can disrupt the process of learning and making new memories, which can make it seem like there is a problem with your memory. In your daily life, you may experience this disruption as forgetting names and appointments, misplacing items, and needing to make lists for shopping and errands. It may be harder for you to stay focused on tasks and feel as "sharp" as you did in the past.  . Also, when we are depressed or anxious, we often pay more attention to our difficulties (rather than our strengths) in our daily life, and this can make it seem to [place] like we are doing worse cognitively than we really are. . The cognitive aspects of depression and anxiety are sometimes observed as an identifiable pattern of poor performance on a neuropsychological evaluation, but it is also possible that all scores on an evaluation are within normal limits. . Regardless of the test scores, distress related to depression and anxiety can interfere with the ability to make  use of your cognitive resources and function optimally across settings such as work or school, maintaining the home and responsibilities, and personal relationships. . Fortunately, there are treatments for depression and anxiety, and when mood improves, cognitive functioning in daily life often improves. . Treatment options include psychotherapy, medications (e.g., antidepressants), and behavioral changes, such as increasing your involvement in enjoyable activities, increasing the amount of exercise you are getting, and maintaining a regular routine.

## 2016-10-26 ENCOUNTER — Encounter: Payer: Self-pay | Admitting: Family Medicine

## 2016-10-28 NOTE — Telephone Encounter (Signed)
Please read her last message to her--- she said it "isn't " helping----  But yes if they are ok with [place] taking over and we will be able to refer back the them in the future if needed we can take over.

## 2016-10-28 NOTE — Telephone Encounter (Signed)
ok 

## 2016-11-09 ENCOUNTER — Other Ambulatory Visit: Payer: Self-pay | Admitting: Family Medicine

## 2016-11-09 DIAGNOSIS — G894 Chronic pain syndrome: Secondary | ICD-10-CM

## 2016-11-16 MED ORDER — HYDROCODONE-ACETAMINOPHEN 10-325 MG PO TABS: 1.0000 | ORAL_TABLET | Freq: Three times a day (TID) | ORAL | 0 refills | Status: DC | PRN

## 2016-11-25 ENCOUNTER — Other Ambulatory Visit: Payer: Self-pay | Admitting: Family Medicine

## 2016-11-25 MED ORDER — PREGABALIN 75 MG PO CAPS: 75.0000 mg | ORAL_CAPSULE | Freq: Two times a day (BID) | ORAL | 0 refills | Status: DC

## 2016-11-25 NOTE — Telephone Encounter (Signed)
Faxed hardcopy to [facility]

## 2016-11-25 NOTE — Telephone Encounter (Signed)
Last refill #90 on 10/07/16 Last office visit 06/28/2016

## 2016-12-27 ENCOUNTER — Encounter: Payer: Self-pay | Admitting: Family Medicine

## 2016-12-27 ENCOUNTER — Ambulatory Visit (INDEPENDENT_AMBULATORY_CARE_PROVIDER_SITE_OTHER): Payer: Medicare Other | Admitting: Family Medicine

## 2016-12-27 VITALS — BP 130/86 | HR 67 | Temp 98.1°F | Resp 16 | Ht 68.0 in | Wt 234.4 lb

## 2016-12-27 DIAGNOSIS — Q828 Other specified congenital malformations of skin: Secondary | ICD-10-CM

## 2016-12-27 DIAGNOSIS — Z79891 Long term (current) use of opiate analgesic: Secondary | ICD-10-CM | POA: Diagnosis not present

## 2016-12-27 DIAGNOSIS — E785 Hyperlipidemia, unspecified: Secondary | ICD-10-CM

## 2016-12-27 DIAGNOSIS — G894 Chronic pain syndrome: Secondary | ICD-10-CM | POA: Diagnosis not present

## 2016-12-27 DIAGNOSIS — L918 Other hypertrophic disorders of the skin: Secondary | ICD-10-CM | POA: Insufficient documentation

## 2016-12-27 DIAGNOSIS — I10 Essential (primary) hypertension: Secondary | ICD-10-CM | POA: Diagnosis not present

## 2016-12-27 MED ORDER — TRAMADOL HCL 50 MG PO TABS: ORAL_TABLET | ORAL | 2 refills | Status: DC

## 2016-12-27 MED ORDER — HYDROCODONE-ACETAMINOPHEN 10-325 MG PO TABS: 1.0000 | ORAL_TABLET | Freq: Three times a day (TID) | ORAL | 0 refills | Status: DC | PRN

## 2016-12-27 NOTE — Patient Instructions (Signed)

## 2016-12-27 NOTE — Assessment & Plan Note (Signed)
rto for skin tag removal  

## 2016-12-27 NOTE — Assessment & Plan Note (Signed)
Pt was seeing pain management--- she was stable with meds so we agreed to take over  Indication for chronic opioid: chronic pain Medication and dose: hydrocodone 10/325  # pills per month: 30 Last UDS date: 12/27/2016 Pain contract signed (Y/N): 06/28/2016 Date narcotic database last reviewed (include red flags): 12/27/2016

## 2016-12-27 NOTE — Assessment & Plan Note (Signed)
Well controlled, no changes to meds. Encouraged heart healthy diet such as the DASH diet and exercise as tolerated.  °

## 2016-12-27 NOTE — Assessment & Plan Note (Signed)
Tolerating statin, encouraged heart healthy diet, avoid trans fats, minimize simple carbs and saturated fats. Increase exercise as tolerated 

## 2016-12-27 NOTE — Progress Notes (Signed)
Patient ID: Veronica Solomon, female   DOB: 09/29/1955, 60 y.o.   MRN: [id number]     Subjective:  I acted as a Education administrator for Dr. Carollee Solomon.  Veronica Solomon, Veronica Solomon   Patient ID: Veronica Solomon, female    DOB: December 13, 1955, 61 y.o.   MRN: [id number]  Chief Complaint  Patient presents with  . Hypertension  . Hyperlipidemia  . chronic pain syndrome    HPI  Patient is in today for follow up blood pressure and cholesterol.  She has been doing well on current treatment for cholesterol with no side effects.  She has been doing great on the pain medication.  She states that she was going to pain clinic and they got her on a good regiman and Dr. Etter Solomon has agreed to take back over writing the prescripstion for pain medications.  Patient Care Team: Veronica Solomon, Veronica Apa, DO as PCP - General   Past Medical History:  Diagnosis Date  . Adenomatous colon polyp   . Allergy   . C. difficile colitis   . Diverticulosis   . Hyperlipidemia   . Hypertension   . IBS (irritable bowel syndrome)   . Neuromuscular disorder Veronica Solomon)     Past Surgical History:  Procedure Laterality Date  . ABDOMINAL HYSTERECTOMY  2002   fibroids    Family History  Problem Relation Age of Onset  . Hypertension Mother   . Depression Mother   . Heart attack Mother   . Uterine cancer Mother   . Hypertension Paternal Grandmother   . Diabetes Maternal Grandmother   . Breast cancer Sister     Social History   Social History  . Marital status: Single    Spouse name: N/A  . Number of children: N/A  . Years of education: N/A   Occupational History  . Not on file.   Social History Main Topics  . Smoking status: Former Smoker    Packs/day: 0.50    Types: Cigarettes    Quit date: 12/29/2011  . Smokeless tobacco: Former Systems developer  . Alcohol use Yes     Comment: rarely  . Drug use: No  . Sexual activity: Not Currently   Other Topics Concern  . Not on file   Social History Narrative  . No narrative on file     Outpatient Medications Prior to Visit  Medication Sig Dispense Refill  . amLODipine (NORVASC) 5 MG tablet TAKE 1 TABLET(5 MG) BY MOUTH DAILY 90 tablet 1  . atorvastatin (LIPITOR) 10 MG tablet Take 1 tablet (10 mg total) by mouth at bedtime. 90 tablet 1  . celecoxib (CELEBREX) 200 MG capsule TAKE 1 CAPSULE BY MOUTH 1 TO 2 TIMES DAILY AS NEEDED 180 capsule 1  . DULoxetine (CYMBALTA) 60 MG capsule TAKE 2 CAPSULES(120 MG) BY MOUTH DAILY 180 capsule 1  . pregabalin (LYRICA) 75 MG capsule Take 1 capsule (75 mg total) by mouth 2 (two) times daily. 90 capsule 0  . HYDROcodone-acetaminophen (NORCO) 10-325 MG tablet Take 1 tablet by mouth every 8 (eight) hours as needed. 30 tablet 0  . amLODipine (NORVASC) 5 MG tablet TAKE 1 TABLET(5 MG) BY MOUTH DAILY 90 tablet 1  . hyoscyamine (LEVSIN/SL) 0.125 MG SL tablet Place 1 tablet (0.125 mg total) under the tongue every 4 (four) hours as needed for cramping. 30 tablet 0   No facility-administered medications prior to visit.     Allergies  Allergen Reactions  . Paregoric     REACTION: HIVES  . Penicillins  REACTION: RASH  . Procaine Hcl     Review of Systems  Constitutional: Negative for fever and malaise/fatigue.  HENT: Negative for congestion.   Eyes: Negative for blurred vision.  Respiratory: Negative for cough and shortness of breath.   Cardiovascular: Negative for chest pain, palpitations and leg swelling.  Gastrointestinal: Negative for vomiting.  Musculoskeletal: Negative for back pain.  Skin: Negative for rash.  Neurological: Negative for loss of consciousness and headaches.       Objective:    Physical Exam  Constitutional: She is oriented to person, place, and time. She appears well-developed and well-nourished. No distress.  HENT:  Head: Normocephalic and atraumatic.  Eyes: Conjunctivae and EOM are normal.  Neck: Normal range of motion. Neck supple. No JVD present. Carotid bruit is not present. No thyromegaly present.   Cardiovascular: Normal rate, regular rhythm and normal heart sounds.   No murmur heard. Pulmonary/Chest: Effort normal and breath sounds normal. No respiratory distress. She has no wheezes. She has no rales. She exhibits no tenderness.  Abdominal: Soft. Bowel sounds are normal. There is no tenderness.  Musculoskeletal: Normal range of motion. She exhibits no edema or deformity.  Neurological: She is alert and oriented to person, place, and time.  Skin: Skin is warm and dry. She is not diaphoretic.     Psychiatric: She has a normal mood and affect.  Nursing note and vitals reviewed.   BP 130/86 (BP Location: Left Arm, Cuff Size: Large)   Pulse 67   Temp 98.1 F (36.7 C) (Oral)   Resp 16   Ht 5\' 8"  (1.727 m)   Wt 234 lb 6.4 oz (106.3 kg)   SpO2 95%   BMI 35.64 kg/m  Wt Readings from Last 3 Encounters:  12/27/16 234 lb 6.4 oz (106.3 kg)  09/01/16 238 lb 11.2 oz (108.3 kg)  06/28/16 238 lb 9.6 oz (108.2 kg)   BP Readings from Last 3 Encounters:  12/27/16 130/86  09/01/16 130/76  06/28/16 138/82     Immunization History  Administered Date(s) Administered  . Td 01/10/2007    Health Maintenance  Topic Date Due  . HIV Screening  09/08/1971  . MAMMOGRAM  01/20/2010  . TETANUS/TDAP  01/09/2017  . INFLUENZA VACCINE  01/12/2017  . COLONOSCOPY  01/31/2019  . Hepatitis C Screening  Completed    Lab Results  Component Value Date   WBC 8.9 07/03/2015   HGB 13.8 07/03/2015   HCT 42.2 07/03/2015   PLT 262.0 07/03/2015   GLUCOSE 95 01/01/2016   CHOL 177 01/01/2016   TRIG 135.0 01/01/2016   HDL 54.70 01/01/2016   LDLDIRECT 195.7 01/12/2010   LDLCALC 95 01/01/2016   ALT 18 01/01/2016   AST 19 01/01/2016   NA 140 01/01/2016   K 5.0 01/01/2016   CL 104 01/01/2016   CREATININE 0.86 01/01/2016   BUN 18 01/01/2016   CO2 31 01/01/2016   TSH 1.48 09/01/2016   HGBA1C 5.8 12/30/2014   MICROALBUR 4.1 (H) 11/24/2012    Lab Results  Component Value Date   TSH 1.48  09/01/2016   Lab Results  Component Value Date   WBC 8.9 07/03/2015   HGB 13.8 07/03/2015   HCT 42.2 07/03/2015   MCV 86.4 07/03/2015   PLT 262.0 07/03/2015   Lab Results  Component Value Date   NA 140 01/01/2016   K 5.0 01/01/2016   CO2 31 01/01/2016   GLUCOSE 95 01/01/2016   BUN 18 01/01/2016   CREATININE 0.86 01/01/2016  BILITOT 0.6 01/01/2016   ALKPHOS 104 01/01/2016   AST 19 01/01/2016   ALT 18 01/01/2016   PROT 7.6 01/01/2016   ALBUMIN 4.6 01/01/2016   CALCIUM 10.1 01/01/2016   GFR 71.70 01/01/2016   Lab Results  Component Value Date   CHOL 177 01/01/2016   Lab Results  Component Value Date   HDL 54.70 01/01/2016   Lab Results  Component Value Date   LDLCALC 95 01/01/2016   Lab Results  Component Value Date   TRIG 135.0 01/01/2016   Lab Results  Component Value Date   CHOLHDL 3 01/01/2016   Lab Results  Component Value Date   HGBA1C 5.8 12/30/2014         Assessment & Plan:   Problem List Items Addressed This Visit      Unprioritized   Accessory skin tags    rto for skin tag removal       Chronic pain syndrome    Pt was seeing pain management--- she was stable with meds so we agreed to take over  Indication for chronic opioid: chronic pain Medication and dose: hydrocodone 10/325  # pills per month: 30 Last UDS date: 12/27/2016 Pain contract signed (Y/N): 06/28/2016 Date narcotic database last reviewed (include red flags): 12/27/2016      Relevant Medications   traMADol (ULTRAM) 50 MG tablet   HYDROcodone-acetaminophen (NORCO) 10-325 MG tablet   Essential hypertension - Primary    Well controlled, no changes to meds. Encouraged heart healthy diet such as the DASH diet and exercise as tolerated.       Relevant Orders   Comprehensive metabolic panel   Hyperlipidemia LDL goal <100    Tolerating statin, encouraged heart healthy diet, avoid trans fats, minimize simple carbs and saturated fats. Increase exercise as tolerated       Relevant Orders   Lipid panel      I have discontinued Ms. Chavous's hyoscyamine. I have also changed her traMADol. Additionally, I am having her maintain her DULoxetine, celecoxib, amLODipine, atorvastatin, pregabalin, and HYDROcodone-acetaminophen.  Meds ordered this encounter  Medications  . DISCONTD: traMADol (ULTRAM) 50 MG tablet    Sig: TK 1 T PO Q 8 H    Refill:  2  . traMADol (ULTRAM) 50 MG tablet    Sig: 1 tablet every 8 hours    Dispense:  30 tablet    Refill:  2  . HYDROcodone-acetaminophen (NORCO) 10-325 MG tablet    Sig: Take 1 tablet by mouth every 8 (eight) hours as needed.    Dispense:  30 tablet    Refill:  0    CMA served as scribe during this visit. History, Physical and Plan performed by medical provider. Documentation and orders reviewed and attested to.  [name], DO

## 2016-12-28 LAB — LIPID PANEL
Cholesterol: 156 mg/dL (ref 0–200)
HDL: 56.3 mg/dL (ref >39.00)
LDL CALC: 75 mg/dL (ref 0–99)
NONHDL: 99.2
Total CHOL/HDL Ratio: 3
Triglycerides: 119 mg/dL (ref 0.0–149.0)
VLDL: 23.8 mg/dL (ref 0.0–40.0)

## 2016-12-28 LAB — COMPREHENSIVE METABOLIC PANEL
ALT: 13 U/L (ref 0–35)
AST: 18 U/L (ref 0–37)
Albumin: 4.6 g/dL (ref 3.5–5.2)
Alkaline Phosphatase: 102 U/L (ref 39–117)
BUN: 14 mg/dL (ref 6–23)
CHLORIDE: 103 meq/L (ref 96–112)
CO2: 31 mEq/L (ref 19–32)
Calcium: 10 mg/dL (ref 8.4–10.5)
Creatinine, Ser: 0.77 mg/dL (ref 0.40–1.20)
GFR: 81.19 mL/min (ref >60.00)
GLUCOSE: 79 mg/dL (ref 70–99)
POTASSIUM: 5.1 meq/L (ref 3.5–5.1)
SODIUM: 139 meq/L (ref 135–145)
Total Bilirubin: 0.5 mg/dL (ref 0.2–1.2)
Total Protein: 7.1 g/dL (ref 6.0–8.3)

## 2016-12-29 ENCOUNTER — Other Ambulatory Visit: Payer: Self-pay | Admitting: Family Medicine

## 2016-12-30 ENCOUNTER — Ambulatory Visit (INDEPENDENT_AMBULATORY_CARE_PROVIDER_SITE_OTHER): Payer: Medicare Other | Admitting: Family Medicine

## 2016-12-30 ENCOUNTER — Encounter: Payer: Self-pay | Admitting: Family Medicine

## 2016-12-30 VITALS — BP 118/72 | HR 83 | Temp 98.2°F | Ht 68.0 in | Wt 231.1 lb

## 2016-12-30 DIAGNOSIS — L918 Other hypertrophic disorders of the skin: Secondary | ICD-10-CM

## 2016-12-30 NOTE — Progress Notes (Signed)
Skin Tag Removal Procedure Note  Pre-operative Diagnosis: Classic skin tags (acrochordon)  Post-operative Diagnosis: Classic skin tags (acrochordon)  Locations:medial thigh  Indications: irritation , changing   Anesthesia: xylocaine with epi 1 % without added sodium bicarbonate  Procedure Details  The risks (including bleeding and infection) and benefits of the procedure and Written informed consent obtained. Using sterile iris scissors, multiple skin tags were snipped off at their bases after cleansing with Betadine.  Bleeding was controlled by pressure and silver nitrate stick  Findings: Pathognomonic benign lesions  not sent for pathological exam.  Condition: Stable  Complications: bleeding.  Plan: 1. Instructed to keep the wounds dry and covered for 24-48h and clean thereafter. 2. Warning signs of infection were reviewed.   3. Recommended that the patient use OTC analgesics as needed for pain.  4. Return as needed.

## 2016-12-30 NOTE — Patient Instructions (Signed)
Skin Tag, Adult A skin tag (acrochordon) is a soft, extra growth of skin. Most skin tags are flesh-colored and rarely bigger than a pencil eraser. They commonly form near areas where there are folds in the skin, such as the armpit or groin. Skin tags are not dangerous, and they do not spread from person to person (are not contagious). You may have one skin tag or several. Skin tags do not require treatment. However, your health care provider may recommend removal of a skin tag if it:  Gets irritated from clothing.  Bleeds.  Is visible and unsightly.  Your health care provider can remove skin tags with a simple surgical procedure or a procedure that involves freezing the skin tag. Follow these instructions at home:  Watch for any changes in your skin tag. A normal skin tag does not require any other special care at home.  Take over-the-counter and prescription medicines only as told by your health care provider.  Keep all follow-up visits as told by your health care provider. This is important. Contact a health care provider if:  You have a skin tag that: ? Becomes painful. ? Changes color. ? Bleeds. ? Swells.  You develop more skin tags. This information is not intended to replace advice given to you by your health care provider. Make sure you discuss any questions you have with your health care provider. Document Released: 06/15/2015 Document Revised: 01/25/2016 Document Reviewed: 06/15/2015 Elsevier Interactive Patient Education  2018 Elsevier Inc.  

## 2017-01-07 ENCOUNTER — Other Ambulatory Visit: Payer: Self-pay | Admitting: Family Medicine

## 2017-01-07 NOTE — Telephone Encounter (Signed)
Faxed hardcopy for Lyrica to [facility]

## 2017-01-07 NOTE — Telephone Encounter (Signed)
Last appointment 12/30/16 Last refill  #90 on 11/25/2016

## 2017-02-22 ENCOUNTER — Other Ambulatory Visit: Payer: Self-pay | Admitting: Family Medicine

## 2017-02-22 NOTE — Telephone Encounter (Signed)
Rx printed and was signed by Dr. Etter Sjogren. I have faxed to pharmacy. Awaiting confirmation

## 2017-02-25 NOTE — Telephone Encounter (Signed)
Received successful conformation  

## 2017-03-07 ENCOUNTER — Other Ambulatory Visit: Payer: Self-pay | Admitting: Family Medicine

## 2017-03-23 ENCOUNTER — Other Ambulatory Visit: Payer: Self-pay | Admitting: Family Medicine

## 2017-04-14 ENCOUNTER — Telehealth: Payer: Self-pay | Admitting: Family Medicine

## 2017-04-14 ENCOUNTER — Other Ambulatory Visit: Payer: Self-pay | Admitting: Family Medicine

## 2017-04-14 DIAGNOSIS — G894 Chronic pain syndrome: Secondary | ICD-10-CM

## 2017-04-18 ENCOUNTER — Other Ambulatory Visit: Payer: Self-pay | Admitting: Family Medicine

## 2017-04-18 DIAGNOSIS — G894 Chronic pain syndrome: Secondary | ICD-10-CM

## 2017-04-19 NOTE — Telephone Encounter (Signed)
Patient checking on the status of Hydrocodone refill request.

## 2017-04-19 NOTE — Telephone Encounter (Signed)
Patient also requesting hydrocodone.    Database ran and is on your desk  Last filled per database: 01/26/17 Last written: 12/27/16 Last ov: 12/27/16  Next ov: 06/30/17 Contract: 06/28/16 UDS: next due 06/29/17

## 2017-04-19 NOTE — Telephone Encounter (Signed)
Patient calling about the status of Hydrocodone refill request. Patient also asking about the status of refill request for LYRICA 75 MG capsule. Please advise if patient needs to pick up in the office or pick up at pharmacy.

## 2017-04-20 NOTE — Telephone Encounter (Signed)
Pt is requesting refill on Hydrocodone-acetaminophen 10-325mg .  Last OV: 12/30/2016 Last Fill: 12/27/2016 #30 and 0RF UDS: 12/27/2016 Low risk  NCCR printed; placed on ledge.   Please advise.

## 2017-04-21 MED ORDER — HYDROCODONE-ACETAMINOPHEN 10-325 MG PO TABS: 1.0000 | ORAL_TABLET | Freq: Three times a day (TID) | ORAL | 0 refills | Status: DC | PRN

## 2017-04-21 MED ORDER — PREGABALIN 75 MG PO CAPS: 75.0000 mg | ORAL_CAPSULE | Freq: Two times a day (BID) | ORAL | 0 refills | Status: DC

## 2017-04-21 NOTE — Telephone Encounter (Signed)
Duplicate Rx printed by mistake, Rx destroyed.

## 2017-04-21 NOTE — Telephone Encounter (Signed)
Rx printed, awaiting DO signature.  

## 2017-04-21 NOTE — Telephone Encounter (Signed)
Refill x1 

## 2017-05-17 ENCOUNTER — Other Ambulatory Visit: Payer: Self-pay | Admitting: Family Medicine

## 2017-05-17 DIAGNOSIS — G894 Chronic pain syndrome: Secondary | ICD-10-CM

## 2017-05-18 ENCOUNTER — Encounter: Payer: Self-pay | Admitting: Family Medicine

## 2017-05-18 NOTE — Telephone Encounter (Signed)
[place] requesting refill for tramadol  Database ran and is on your desk for review.  Last filled per database: 05/02/17 Last written: 12/27/16 Last ov: 12/27/16  Next ov: 06/30/17 Contract: 06/28/16 UDS: due 06/29/17  Patient also taking Hydrocodone and Lyrica both filled 04/21/17

## 2017-05-30 ENCOUNTER — Other Ambulatory Visit: Payer: Self-pay | Admitting: Family Medicine

## 2017-05-30 ENCOUNTER — Encounter: Payer: Self-pay | Admitting: Family Medicine

## 2017-05-30 DIAGNOSIS — G894 Chronic pain syndrome: Secondary | ICD-10-CM

## 2017-05-30 MED ORDER — TRAMADOL HCL 50 MG PO TABS: ORAL_TABLET | ORAL | 1 refills | Status: DC

## 2017-05-30 NOTE — Telephone Encounter (Signed)
Pt is requesting refill on tramadol 50mg . Pt requesting more than a 10 day supply.   Last fill: 12/30/2016 Last Fill: 05/19/2017 #30 and 0RF (Pt sig: 1 tab q8h prn) UDS: 12/27/2016 Low risk  Unable to get into [facility]CR.   Please adivse.

## 2017-05-30 NOTE — Telephone Encounter (Signed)
sent 

## 2017-06-02 ENCOUNTER — Other Ambulatory Visit: Payer: Self-pay | Admitting: Family Medicine

## 2017-06-02 MED ORDER — PREGABALIN 75 MG PO CAPS: 75.0000 mg | ORAL_CAPSULE | Freq: Two times a day (BID) | ORAL | 1 refills | Status: DC

## 2017-06-02 NOTE — Telephone Encounter (Signed)
Ok to refill x 1 year 

## 2017-06-02 NOTE — Telephone Encounter (Signed)
rx sent in and mychart message sent

## 2017-06-02 NOTE — Telephone Encounter (Signed)
Pt is requesting refill on Lyrica.  Last OV: 12/30/2016 Last Fill: 04/21/2017 #90 and 0RF (Pt sig: 1 tab bid)   Please advise.

## 2017-06-30 ENCOUNTER — Other Ambulatory Visit: Payer: Self-pay | Admitting: Family Medicine

## 2017-06-30 ENCOUNTER — Ambulatory Visit: Payer: BC Managed Care – PPO | Admitting: Family Medicine

## 2017-07-18 ENCOUNTER — Telehealth: Payer: Self-pay

## 2017-07-18 NOTE — Telephone Encounter (Signed)
PA initiated via Covermymeds; KEY: [id number]. Awaiting determination.

## 2017-07-19 NOTE — Telephone Encounter (Signed)
Request Reference Number: [id number]. TRAMADOL HCL TAB 50MG  is approved through 06/13/2018. For further questions, call [phone].

## 2017-07-22 ENCOUNTER — Encounter: Payer: Self-pay | Admitting: Family Medicine

## 2017-07-22 ENCOUNTER — Ambulatory Visit (INDEPENDENT_AMBULATORY_CARE_PROVIDER_SITE_OTHER): Payer: Self-pay | Admitting: Family Medicine

## 2017-07-22 VITALS — BP 142/88 | HR 79 | Temp 98.0°F | Resp 18 | Wt 236.0 lb

## 2017-07-22 DIAGNOSIS — G894 Chronic pain syndrome: Secondary | ICD-10-CM

## 2017-07-22 DIAGNOSIS — Z79899 Other long term (current) drug therapy: Secondary | ICD-10-CM

## 2017-07-22 DIAGNOSIS — E785 Hyperlipidemia, unspecified: Secondary | ICD-10-CM

## 2017-07-22 DIAGNOSIS — I10 Essential (primary) hypertension: Secondary | ICD-10-CM

## 2017-07-22 LAB — LIPID PANEL
CHOLESTEROL: 177 mg/dL (ref <200)
HDL: 66 mg/dL (ref >50)
LDL Cholesterol (Calc): 88 mg/dL (calc)
Non-HDL Cholesterol (Calc): 111 mg/dL (calc) (ref <130)
TRIGLYCERIDES: 132 mg/dL (ref <150)
Total CHOL/HDL Ratio: 2.7 (calc) (ref <5.0)

## 2017-07-22 LAB — COMPREHENSIVE METABOLIC PANEL WITH GFR
AG Ratio: 2 (calc) (ref 1.0–2.5)
ALT: 23 U/L (ref 6–29)
AST: 23 U/L (ref 10–35)
Albumin: 4.6 g/dL (ref 3.6–5.1)
Alkaline phosphatase (APISO): 101 U/L (ref 33–130)
BUN: 15 mg/dL (ref 7–25)
CO2: 29 mmol/L (ref 20–32)
Calcium: 9.7 mg/dL (ref 8.6–10.4)
Chloride: 104 mmol/L (ref 98–110)
Creat: 0.72 mg/dL (ref 0.50–0.99)
Globulin: 2.3 g/dL (ref 1.9–3.7)
Glucose, Bld: 93 mg/dL (ref 65–99)
Potassium: 4.4 mmol/L (ref 3.5–5.3)
Sodium: 140 mmol/L (ref 135–146)
Total Bilirubin: 0.7 mg/dL (ref 0.2–1.2)
Total Protein: 6.9 g/dL (ref 6.1–8.1)

## 2017-07-22 MED ORDER — HYDROCODONE-ACETAMINOPHEN 10-325 MG PO TABS: 1.0000 | ORAL_TABLET | Freq: Three times a day (TID) | ORAL | 0 refills | Status: DC | PRN

## 2017-07-22 MED ORDER — PREGABALIN 75 MG PO CAPS: 75.0000 mg | ORAL_CAPSULE | Freq: Two times a day (BID) | ORAL | 1 refills | Status: DC

## 2017-07-22 NOTE — Assessment & Plan Note (Signed)
Tolerating statin, encouraged heart healthy diet, avoid trans fats, minimize simple carbs and saturated fats. Increase exercise as tolerated 

## 2017-07-22 NOTE — Progress Notes (Deleted)
Subjective:  I acted as a Education administrator for Dr. Etter Sjogren. Yancey Flemings, [place]   Patient ID: Veronica Solomon, female    DOB: 1956-04-15, 62 y.o.   MRN: [id number]  No chief complaint on file.   HPI  Patient is in today for   Patient Care Team: Carollee Herter, Alferd Apa, DO as PCP - General   Past Medical History:  Diagnosis Date  . Adenomatous colon polyp   . Allergy   . C. difficile colitis   . Diverticulosis   . Hyperlipidemia   . Hypertension   . IBS (irritable bowel syndrome)   . Neuromuscular disorder [facility])     Past Surgical History:  Procedure Laterality Date  . ABDOMINAL HYSTERECTOMY  2002   fibroids    Family History  Problem Relation Age of Onset  . Hypertension Mother   . Depression Mother   . Heart attack Mother   . Uterine cancer Mother   . Hypertension Paternal Grandmother   . Diabetes Maternal Grandmother   . Breast cancer Sister     Social History   Socioeconomic History  . Marital status: Single    Spouse name: Not on file  . Number of children: Not on file  . Years of education: Not on file  . Highest education level: Not on file  Social Needs  . Financial resource strain: Not on file  . Food insecurity - worry: Not on file  . Food insecurity - inability: Not on file  . Transportation needs - medical: Not on file  . Transportation needs - non-medical: Not on file  Occupational History  . Not on file  Tobacco Use  . Smoking status: Former Smoker    Packs/day: 0.50    Types: Cigarettes    Last attempt to quit: 12/29/2011    Years since quitting: 5.5  . Smokeless tobacco: Former Network engineer and Sexual Activity  . Alcohol use: Yes    Comment: rarely  . Drug use: No  . Sexual activity: Not Currently  Other Topics Concern  . Not on file  Social History Narrative  . Not on file    Outpatient Medications Prior to Visit  Medication Sig Dispense Refill  . amLODipine (NORVASC) 5 MG tablet TAKE 1 TABLET(5 MG) BY MOUTH DAILY 90 tablet 1  .  atorvastatin (LIPITOR) 10 MG tablet TAKE 1 TABLET(10 MG) BY MOUTH AT BEDTIME 90 tablet 0  . atorvastatin (LIPITOR) 10 MG tablet TAKE 1 TABLET(10 MG) BY MOUTH AT BEDTIME 90 tablet 0  . celecoxib (CELEBREX) 200 MG capsule TAKE 1 CAPSULE BY MOUTH 1 TO 2 TIMES DAILY AS NEEDED 180 capsule 0  . DULoxetine (CYMBALTA) 60 MG capsule TAKE 2 CAPSULE BY MOUTH EVERY DAY 180 capsule 0  . DULoxetine (CYMBALTA) 60 MG capsule TAKE 2 CAPSULE BY MOUTH EVERY DAY 180 capsule 0  . HYDROcodone-acetaminophen (NORCO) 10-325 MG tablet Take 1 tablet every 8 (eight) hours as needed by mouth. 30 tablet 0  . pregabalin (LYRICA) 75 MG capsule Take 1 capsule (75 mg total) by mouth 2 (two) times daily. 180 capsule 1  . traMADol (ULTRAM) 50 MG tablet TAKE 1 TABLET BY MOUTH EVERY 8 HOURS 90 tablet 1   No facility-administered medications prior to visit.     Allergies  Allergen Reactions  . Paregoric     REACTION: HIVES  . Penicillins     REACTION: RASH  . Procaine Hcl     ROS     Objective:  Physical Exam  There were no vitals taken for this visit. Wt Readings from Last 3 Encounters:  12/30/16 231 lb 2 oz (104.8 kg)  12/27/16 234 lb 6.4 oz (106.3 kg)  09/01/16 238 lb 11.2 oz (108.3 kg)   BP Readings from Last 3 Encounters:  12/30/16 118/72  12/27/16 130/86  09/01/16 130/76     Immunization History  Administered Date(s) Administered  . Td 01/10/2007    Health Maintenance  Topic Date Due  . HIV Screening  09/08/1971  . MAMMOGRAM  01/20/2010  . TETANUS/TDAP  01/09/2017  . INFLUENZA VACCINE  01/12/2017  . COLONOSCOPY  01/31/2019  . Hepatitis C Screening  Completed    Lab Results  Component Value Date   WBC 8.9 07/03/2015   HGB 13.8 07/03/2015   HCT 42.2 07/03/2015   PLT 262.0 07/03/2015   GLUCOSE 79 12/27/2016   CHOL 156 12/27/2016   TRIG 119.0 12/27/2016   HDL 56.30 12/27/2016   LDLDIRECT 195.7 01/12/2010   LDLCALC 75 12/27/2016   ALT 13 12/27/2016   AST 18 12/27/2016   NA 139  12/27/2016   K 5.1 12/27/2016   CL 103 12/27/2016   CREATININE 0.77 12/27/2016   BUN 14 12/27/2016   CO2 31 12/27/2016   TSH 1.48 09/01/2016   HGBA1C 5.8 12/30/2014   MICROALBUR 4.1 (H) 11/24/2012    Lab Results  Component Value Date   TSH 1.48 09/01/2016   Lab Results  Component Value Date   WBC 8.9 07/03/2015   HGB 13.8 07/03/2015   HCT 42.2 07/03/2015   MCV 86.4 07/03/2015   PLT 262.0 07/03/2015   Lab Results  Component Value Date   NA 139 12/27/2016   K 5.1 12/27/2016   CO2 31 12/27/2016   GLUCOSE 79 12/27/2016   BUN 14 12/27/2016   CREATININE 0.77 12/27/2016   BILITOT 0.5 12/27/2016   ALKPHOS 102 12/27/2016   AST 18 12/27/2016   ALT 13 12/27/2016   PROT 7.1 12/27/2016   ALBUMIN 4.6 12/27/2016   CALCIUM 10.0 12/27/2016   GFR 81.19 12/27/2016   Lab Results  Component Value Date   CHOL 156 12/27/2016   Lab Results  Component Value Date   HDL 56.30 12/27/2016   Lab Results  Component Value Date   LDLCALC 75 12/27/2016   Lab Results  Component Value Date   TRIG 119.0 12/27/2016   Lab Results  Component Value Date   CHOLHDL 3 12/27/2016   Lab Results  Component Value Date   HGBA1C 5.8 12/30/2014         Assessment & Plan:   Problem List Items Addressed This Visit    None      I am having Veronica Solomon maintain her amLODipine, DULoxetine, atorvastatin, DULoxetine, atorvastatin, HYDROcodone-acetaminophen, traMADol, pregabalin, and celecoxib.  No orders of the defined types were placed in this encounter.   {PROVIDER TO DELETE} Bartholome Bill, RMA

## 2017-07-22 NOTE — Progress Notes (Signed)
Subjective:  I acted as a Education administrator for Dr. Curt Jews, RMA  Patient ID: Veronica Solomon, female    DOB: 09-22-1955, 62 y.o.   MRN: [id number]  Chief Complaint  Patient presents with  . Hypertension  . Hyperlipidemia    HPI  Patient is in today for a follow up for her blood pressure. She has no acute concerns.     Patient Care Team: Carollee Herter, Alferd Apa, DO as PCP - General   Past Medical History:  Diagnosis Date  . Adenomatous colon polyp   . Allergy   . C. difficile colitis   . Diverticulosis   . Hyperlipidemia   . Hypertension   . IBS (irritable bowel syndrome)   . Neuromuscular disorder [facility])     Past Surgical History:  Procedure Laterality Date  . ABDOMINAL HYSTERECTOMY  2002   fibroids    Family History  Problem Relation Age of Onset  . Hypertension Mother   . Depression Mother   . Heart attack Mother   . Uterine cancer Mother   . Hypertension Paternal Grandmother   . Diabetes Maternal Grandmother   . Breast cancer Sister     Social History   Socioeconomic History  . Marital status: Single    Spouse name: Not on file  . Number of children: Not on file  . Years of education: Not on file  . Highest education level: Not on file  Social Needs  . Financial resource strain: Not on file  . Food insecurity - worry: Not on file  . Food insecurity - inability: Not on file  . Transportation needs - medical: Not on file  . Transportation needs - non-medical: Not on file  Occupational History  . Not on file  Tobacco Use  . Smoking status: Former Smoker    Packs/day: 0.50    Types: Cigarettes    Last attempt to quit: 12/29/2011    Years since quitting: 5.5  . Smokeless tobacco: Former Network engineer and Sexual Activity  . Alcohol use: Yes    Comment: rarely  . Drug use: No  . Sexual activity: Not Currently  Other Topics Concern  . Not on file  Social History Narrative  . Not on file    Outpatient Medications Prior to Visit  Medication  Sig Dispense Refill  . amLODipine (NORVASC) 5 MG tablet TAKE 1 TABLET(5 MG) BY MOUTH DAILY 90 tablet 1  . atorvastatin (LIPITOR) 10 MG tablet TAKE 1 TABLET(10 MG) BY MOUTH AT BEDTIME 90 tablet 0  . celecoxib (CELEBREX) 200 MG capsule TAKE 1 CAPSULE BY MOUTH 1 TO 2 TIMES DAILY AS NEEDED 180 capsule 0  . DULoxetine (CYMBALTA) 60 MG capsule TAKE 2 CAPSULE BY MOUTH EVERY DAY 180 capsule 0  . DULoxetine (CYMBALTA) 60 MG capsule TAKE 2 CAPSULE BY MOUTH EVERY DAY 180 capsule 0  . traMADol (ULTRAM) 50 MG tablet TAKE 1 TABLET BY MOUTH EVERY 8 HOURS 90 tablet 1  . atorvastatin (LIPITOR) 10 MG tablet TAKE 1 TABLET(10 MG) BY MOUTH AT BEDTIME 90 tablet 0  . HYDROcodone-acetaminophen (NORCO) 10-325 MG tablet Take 1 tablet every 8 (eight) hours as needed by mouth. 30 tablet 0  . pregabalin (LYRICA) 75 MG capsule Take 1 capsule (75 mg total) by mouth 2 (two) times daily. 180 capsule 1   No facility-administered medications prior to visit.     Allergies  Allergen Reactions  . Paregoric     REACTION: HIVES  . Penicillins  REACTION: RASH  . Procaine Hcl     Review of Systems  Constitutional: Negative for fever and malaise/fatigue.  HENT: Negative for congestion.   Eyes: Negative for blurred vision.  Respiratory: Negative for shortness of breath.   Cardiovascular: Negative for chest pain, palpitations and leg swelling.  Gastrointestinal: Negative for abdominal pain, blood in stool and nausea.  Genitourinary: Negative for dysuria and frequency.  Musculoskeletal: Negative for falls.  Skin: Negative for rash.  Neurological: Negative for dizziness, loss of consciousness and headaches.  Endo/Heme/Allergies: Negative for environmental allergies.  Psychiatric/Behavioral: Negative for depression. The patient is not nervous/anxious.        Objective:    Physical Exam  Constitutional: She is oriented to person, place, and time. She appears well-developed and well-nourished.  HENT:  Head:  Normocephalic and atraumatic.  Eyes: Conjunctivae and EOM are normal.  Neck: Normal range of motion. Neck supple. No JVD present. Carotid bruit is not present. No thyromegaly present.  Cardiovascular: Normal rate, regular rhythm and normal heart sounds.  No murmur heard. Pulmonary/Chest: Effort normal and breath sounds normal. No respiratory distress. She has no wheezes. She has no rales. She exhibits no tenderness.  Musculoskeletal: She exhibits no edema.  Neurological: She is alert and oriented to person, place, and time.  Psychiatric: She has a normal mood and affect.  Nursing note and vitals reviewed.   BP (!) 142/88 (BP Location: Left Arm, Patient Position: Sitting, Cuff Size: Normal)   Pulse 79   Temp 98 F (36.7 C) (Oral)   Resp 18   Wt 236 lb (107 kg)   SpO2 96%   BMI 35.88 kg/m  Wt Readings from Last 3 Encounters:  07/22/17 236 lb (107 kg)  12/30/16 231 lb 2 oz (104.8 kg)  12/27/16 234 lb 6.4 oz (106.3 kg)   BP Readings from Last 3 Encounters:  07/22/17 (!) 142/88  12/30/16 118/72  12/27/16 130/86     Immunization History  Administered Date(s) Administered  . Td 01/10/2007    Health Maintenance  Topic Date Due  . HIV Screening  09/08/1971  . MAMMOGRAM  01/20/2010  . TETANUS/TDAP  01/09/2017  . INFLUENZA VACCINE  01/12/2017  . COLONOSCOPY  01/31/2019  . Hepatitis C Screening  Completed    Lab Results  Component Value Date   WBC 8.9 07/03/2015   HGB 13.8 07/03/2015   HCT 42.2 07/03/2015   PLT 262.0 07/03/2015   GLUCOSE 79 12/27/2016   CHOL 156 12/27/2016   TRIG 119.0 12/27/2016   HDL 56.30 12/27/2016   LDLDIRECT 195.7 01/12/2010   LDLCALC 75 12/27/2016   ALT 13 12/27/2016   AST 18 12/27/2016   NA 139 12/27/2016   K 5.1 12/27/2016   CL 103 12/27/2016   CREATININE 0.77 12/27/2016   BUN 14 12/27/2016   CO2 31 12/27/2016   TSH 1.48 09/01/2016   HGBA1C 5.8 12/30/2014   MICROALBUR 4.1 (H) 11/24/2012    Lab Results  Component Value Date   TSH  1.48 09/01/2016   Lab Results  Component Value Date   WBC 8.9 07/03/2015   HGB 13.8 07/03/2015   HCT 42.2 07/03/2015   MCV 86.4 07/03/2015   PLT 262.0 07/03/2015   Lab Results  Component Value Date   NA 139 12/27/2016   K 5.1 12/27/2016   CO2 31 12/27/2016   GLUCOSE 79 12/27/2016   BUN 14 12/27/2016   CREATININE 0.77 12/27/2016   BILITOT 0.5 12/27/2016   ALKPHOS 102 12/27/2016   AST  18 12/27/2016   ALT 13 12/27/2016   PROT 7.1 12/27/2016   ALBUMIN 4.6 12/27/2016   CALCIUM 10.0 12/27/2016   GFR 81.19 12/27/2016   Lab Results  Component Value Date   CHOL 156 12/27/2016   Lab Results  Component Value Date   HDL 56.30 12/27/2016   Lab Results  Component Value Date   LDLCALC 75 12/27/2016   Lab Results  Component Value Date   TRIG 119.0 12/27/2016   Lab Results  Component Value Date   CHOLHDL 3 12/27/2016   Lab Results  Component Value Date   HGBA1C 5.8 12/30/2014         Assessment & Plan:   Problem List Items Addressed This Visit      Unprioritized   Chronic pain syndrome   Relevant Medications   HYDROcodone-acetaminophen (NORCO) 10-325 MG tablet   pregabalin (LYRICA) 75 MG capsule   Essential hypertension    Well controlled, no changes to meds. Encouraged heart healthy diet such as the DASH diet and exercise as tolerated.       Relevant Orders   Lipid panel   Comp Met (CMET)   Hyperlipidemia LDL goal <100    Tolerating statin, encouraged heart healthy diet, avoid trans fats, minimize simple carbs and saturated fats. Increase exercise as tolerated      Relevant Orders   Lipid panel   Comp Met (CMET)    Other Visit Diagnoses    High risk medication use    -  Primary   Relevant Orders   Pain Mgmt, Profile 8 w/Conf, U      I have changed Veronica Solomon's HYDROcodone-acetaminophen. I am also having her maintain her amLODipine, DULoxetine, DULoxetine, atorvastatin, traMADol, celecoxib, and pregabalin.  Meds ordered this encounter    Medications  . HYDROcodone-acetaminophen (NORCO) 10-325 MG tablet    Sig: Take 1 tablet by mouth every 8 (eight) hours as needed.    Dispense:  30 tablet    Refill:  0  . DISCONTD: pregabalin (LYRICA) 75 MG capsule    Sig: Take 1 capsule (75 mg total) by mouth 2 (two) times daily.    Dispense:  180 capsule    Refill:  1  . pregabalin (LYRICA) 75 MG capsule    Sig: Take 1 capsule (75 mg total) by mouth 2 (two) times daily.    Dispense:  180 capsule    Refill:  1    CMA served as scribe during this visit. History, Physical and Plan performed by medical provider. Documentation and orders reviewed and attested to.  [name], DO

## 2017-07-22 NOTE — Assessment & Plan Note (Signed)
Well controlled, no changes to meds. Encouraged heart healthy diet such as the DASH diet and exercise as tolerated.  °

## 2017-07-22 NOTE — Patient Instructions (Signed)
DASH Eating Plan DASH stands for "Dietary Approaches to Stop Hypertension." The DASH eating plan is a healthy eating plan that has been shown to reduce high blood pressure (hypertension). It may also reduce your risk for type 2 diabetes, heart disease, and stroke. The DASH eating plan may also help with weight loss. What are tips for following this plan? General guidelines  Avoid eating more than 2,300 mg (milligrams) of salt (sodium) a day. If you have hypertension, you may need to reduce your sodium intake to 1,500 mg a day.  Limit alcohol intake to no more than 1 drink a day for nonpregnant women and 2 drinks a day for men. One drink equals 12 oz of beer, 5 oz of wine, or 1 oz of hard liquor.  Work with your health care provider to maintain a healthy body weight or to lose weight. Ask what an ideal weight is for you.  Get at least 30 minutes of exercise that causes your heart to beat faster (aerobic exercise) most days of the week. Activities may include walking, swimming, or biking.  Work with your health care provider or diet and nutrition specialist (dietitian) to adjust your eating plan to your individual calorie needs. Reading food labels  Check food labels for the amount of sodium per serving. Choose foods with less than 5 percent of the Daily Value of sodium. Generally, foods with less than 300 mg of sodium per serving fit into this eating plan.  To find whole grains, look for the word "whole" as the first word in the ingredient list. Shopping  Buy products labeled as "low-sodium" or "no salt added."  Buy fresh foods. Avoid canned foods and premade or frozen meals. Cooking  Avoid adding salt when cooking. Use salt-free seasonings or herbs instead of table salt or sea salt. Check with your health care provider or pharmacist before using salt substitutes.  Do not fry foods. Cook foods using healthy methods such as baking, boiling, grilling, and broiling instead.  Cook with  heart-healthy oils, such as olive, canola, soybean, or sunflower oil. Meal planning   Eat a balanced diet that includes: ? 5 or more servings of fruits and vegetables each day. At each meal, try to fill half of your plate with fruits and vegetables. ? Up to 6-8 servings of whole grains each day. ? Less than 6 oz of lean meat, poultry, or fish each day. A 3-oz serving of meat is about the same size as a deck of cards. One egg equals 1 oz. ? 2 servings of low-fat dairy each day. ? A serving of nuts, seeds, or beans 5 times each week. ? Heart-healthy fats. Healthy fats called Omega-3 fatty acids are found in foods such as flaxseeds and coldwater fish, like sardines, salmon, and mackerel.  Limit how much you eat of the following: ? Canned or prepackaged foods. ? Food that is high in trans fat, such as fried foods. ? Food that is high in saturated fat, such as fatty meat. ? Sweets, desserts, sugary drinks, and other foods with added sugar. ? Full-fat dairy products.  Do not salt foods before eating.  Try to eat at least 2 vegetarian meals each week.  Eat more home-cooked food and less restaurant, buffet, and fast food.  When eating at a restaurant, ask that your food be prepared with less salt or no salt, if possible. What foods are recommended? The items listed may not be a complete list. Talk with your dietitian about what   dietary choices are best for you. Grains Whole-grain or whole-wheat bread. Whole-grain or whole-wheat pasta. Brown rice. Oatmeal. Quinoa. Bulgur. Whole-grain and low-sodium cereals. Pita bread. Low-fat, low-sodium crackers. Whole-wheat flour tortillas. Vegetables Fresh or frozen vegetables (raw, steamed, roasted, or grilled). Low-sodium or reduced-sodium tomato and vegetable juice. Low-sodium or reduced-sodium tomato sauce and tomato paste. Low-sodium or reduced-sodium canned vegetables. Fruits All fresh, dried, or frozen fruit. Canned fruit in natural juice (without  added sugar). Meat and other protein foods Skinless chicken or turkey. Ground chicken or turkey. Pork with fat trimmed off. Fish and seafood. Egg whites. Dried beans, peas, or lentils. Unsalted nuts, nut butters, and seeds. Unsalted canned beans. Lean cuts of beef with fat trimmed off. Low-sodium, lean deli meat. Dairy Low-fat (1%) or fat-free (skim) milk. Fat-free, low-fat, or reduced-fat cheeses. Nonfat, low-sodium ricotta or cottage cheese. Low-fat or nonfat yogurt. Low-fat, low-sodium cheese. Fats and oils Soft margarine without trans fats. Vegetable oil. Low-fat, reduced-fat, or light mayonnaise and salad dressings (reduced-sodium). Canola, safflower, olive, soybean, and sunflower oils. Avocado. Seasoning and other foods Herbs. Spices. Seasoning mixes without salt. Unsalted popcorn and pretzels. Fat-free sweets. What foods are not recommended? The items listed may not be a complete list. Talk with your dietitian about what dietary choices are best for you. Grains Baked goods made with fat, such as croissants, muffins, or some breads. Dry pasta or rice meal packs. Vegetables Creamed or fried vegetables. Vegetables in a cheese sauce. Regular canned vegetables (not low-sodium or reduced-sodium). Regular canned tomato sauce and paste (not low-sodium or reduced-sodium). Regular tomato and vegetable juice (not low-sodium or reduced-sodium). Pickles. Olives. Fruits Canned fruit in a light or heavy syrup. Fried fruit. Fruit in cream or butter sauce. Meat and other protein foods Fatty cuts of meat. Ribs. Fried meat. Bacon. Sausage. Bologna and other processed lunch meats. Salami. Fatback. Hotdogs. Bratwurst. Salted nuts and seeds. Canned beans with added salt. Canned or smoked fish. Whole eggs or egg yolks. Chicken or turkey with skin. Dairy Whole or 2% milk, cream, and half-and-half. Whole or full-fat cream cheese. Whole-fat or sweetened yogurt. Full-fat cheese. Nondairy creamers. Whipped toppings.  Processed cheese and cheese spreads. Fats and oils Butter. Stick margarine. Lard. Shortening. Ghee. Bacon fat. Tropical oils, such as coconut, palm kernel, or palm oil. Seasoning and other foods Salted popcorn and pretzels. Onion salt, garlic salt, seasoned salt, table salt, and sea salt. Worcestershire sauce. Tartar sauce. Barbecue sauce. Teriyaki sauce. Soy sauce, including reduced-sodium. Steak sauce. Canned and packaged gravies. Fish sauce. Oyster sauce. Cocktail sauce. Horseradish that you find on the shelf. Ketchup. Mustard. Meat flavorings and tenderizers. Bouillon cubes. Hot sauce and Tabasco sauce. Premade or packaged marinades. Premade or packaged taco seasonings. Relishes. Regular salad dressings. Where to find more information:  National Heart, Lung, and Blood Institute: www.nhlbi.nih.gov  American Heart Association: www.heart.org Summary  The DASH eating plan is a healthy eating plan that has been shown to reduce high blood pressure (hypertension). It may also reduce your risk for type 2 diabetes, heart disease, and stroke.  With the DASH eating plan, you should limit salt (sodium) intake to 2,300 mg a day. If you have hypertension, you may need to reduce your sodium intake to 1,500 mg a day.  When on the DASH eating plan, aim to eat more fresh fruits and vegetables, whole grains, lean proteins, low-fat dairy, and heart-healthy fats.  Work with your health care provider or diet and nutrition specialist (dietitian) to adjust your eating plan to your individual   calorie needs. This information is not intended to replace advice given to you by your health care provider. Make sure you discuss any questions you have with your health care provider. Document Released: 05/20/2011 Document Revised: 05/24/2016 Document Reviewed: 05/24/2016 Elsevier Interactive Patient Education  2018 [employer] Inc.  

## 2017-07-27 LAB — PAIN MGMT, PROFILE 8 W/CONF, U
6 Acetylmorphine: NEGATIVE ng/mL (ref <10)
AMPHETAMINES: NEGATIVE ng/mL (ref <500)
Alcohol Metabolites: NEGATIVE ng/mL (ref <500)
Benzodiazepines: NEGATIVE ng/mL (ref <100)
Buprenorphine, Urine: NEGATIVE ng/mL (ref <5)
COCAINE METABOLITE: NEGATIVE ng/mL (ref <150)
CREATININE: 88.4 mg/dL
Codeine: NEGATIVE ng/mL (ref <50)
Hydrocodone: 98 ng/mL — ABNORMAL HIGH (ref <50)
Hydromorphone: NEGATIVE ng/mL (ref <50)
MDMA: NEGATIVE ng/mL (ref <500)
Marijuana Metabolite: NEGATIVE ng/mL (ref <20)
Morphine: NEGATIVE ng/mL (ref <50)
NORHYDROCODONE: 335 ng/mL — AB (ref <50)
OPIATES: POSITIVE ng/mL — AB (ref <100)
OXIDANT: NEGATIVE ug/mL (ref <200)
Oxycodone: NEGATIVE ng/mL (ref <100)
PH: 7.01 (ref 4.5–9.0)

## 2017-08-27 ENCOUNTER — Other Ambulatory Visit: Payer: Self-pay | Admitting: Family Medicine

## 2017-08-27 DIAGNOSIS — G894 Chronic pain syndrome: Secondary | ICD-10-CM

## 2017-08-30 NOTE — Telephone Encounter (Signed)
Copied from [phone]. Topic: Quick Communication - Rx Refill/Question >> Aug 30, 2017  9:26 AM Synthia Innocent wrote: Medication: traMADol (ULTRAM) 50 MG tablet    Has the patient contacted their pharmacy? Yes.     (Agent: If no, request that the patient contact the pharmacy for the refill.)   Preferred Pharmacy (with phone number or street name): [facility] on [address]: Please be advised that RX refills may take up to 3 business days. We ask that you follow-up with your pharmacy.

## 2017-08-30 NOTE — Telephone Encounter (Signed)
[facility] [address] requesting refill for tramadol  Patient is also taking hydrocodone and lyrica please see database  Database ran and is on your desk for review.  Last filled per database: 07/16/17 Last written: 05/30/17 Last ov: 07/22/17 Next ov: 01/19/18 Contract: 12/27/17 UDS: 01/19/18

## 2017-09-05 ENCOUNTER — Encounter: Payer: Self-pay | Admitting: Neurology

## 2017-09-05 ENCOUNTER — Ambulatory Visit: Payer: Medicare Other | Admitting: Neurology

## 2017-09-05 ENCOUNTER — Other Ambulatory Visit: Payer: Self-pay

## 2017-09-05 VITALS — BP 164/80 | HR 86 | Ht 68.5 in | Wt 236.0 lb

## 2017-09-05 DIAGNOSIS — R413 Other amnesia: Secondary | ICD-10-CM | POA: Diagnosis not present

## 2017-09-05 DIAGNOSIS — F320 Major depressive disorder, single episode, mild: Secondary | ICD-10-CM

## 2017-09-05 DIAGNOSIS — G894 Chronic pain syndrome: Secondary | ICD-10-CM

## 2017-09-05 NOTE — Patient Instructions (Signed)
Great seeing you! Continue with treatment of pain, monitor depression. If memory symptoms are worsening, recommend seeing a therapist and psychiatrist, memory changes due to mood are a treatable cause of memory loss. Follow up on as needed basis, call for any changes.   RECOMMENDATIONS FOR ALL PATIENTS WITH MEMORY PROBLEMS: 1. Continue to exercise (Recommend 30 minutes of walking everyday, or 3 hours every week) 2. Increase social interactions - continue going to [place] and enjoy social gatherings with friends and family 3. Eat healthy, avoid fried foods and eat more fruits and vegetables 4. Maintain adequate blood pressure, blood sugar, and blood cholesterol level. Reducing the risk of stroke and cardiovascular disease also helps promoting better memory. 5. Avoid stressful situations. Live a simple life and avoid aggravations. Organize your time and prepare for the next day in anticipation. 6. Sleep well, avoid any interruptions of sleep and avoid any distractions in the bedroom that may interfere with adequate sleep quality 7. Avoid sugar, avoid sweets as there is a strong link between excessive sugar intake, diabetes, and cognitive impairment We discussed the Mediterranean diet, which has been shown to help patients reduce the risk of progressive memory disorders and reduces cardiovascular risk. This includes eating fish, eat fruits and green leafy vegetables, nuts like almonds and hazelnuts, walnuts, and also use olive oil. Avoid fast foods and fried foods as much as possible. Avoid sweets and sugar as sugar use has been linked to worsening of memory function.

## 2017-09-05 NOTE — Progress Notes (Signed)
NEUROLOGY FOLLOW UP OFFICE NOTE  Veronica Solomon [id number] [birth year]  HISTORY OF PRESENT ILLNESS: I had the pleasure of seeing Veronica Solomon in follow-up in the neurology clinic on 09/05/2017.  The patient was last seen a year ago for worsening memory. Records and images were personally reviewed where available.  TSH and B12 normal. She underwent Neuropsychological testing in May 2018, there was no cognitive impairment noted on testing, almost all performances were in the high average to superior range, no impaired performances seen. It was noted that she had symptoms consistent with moderate major depressive disorder. It appeared that cognitive complaints are likely secondary to depression and chronic pain. She feels her memory is better, she has gone back to school studying Can[employer], and gets As on average. She denies getting lost driving. She has occasionally forgotten bill payments. No missed medications. She continues to deal with pain, sitting here is causing her pain. She feels her depression is up and down, but she has not seen a therapist, stating she does not have time. She is busy with volunteer work and school, she keeps active walking several miles a day.   HPI 09/01/2016: This is a pleasant 62 yo RH woman with a history of hypertension, hyperlipidemia, fibromyalgia/chronic pain, who presented for evaluation of cognitive changes that started a number of years ago. Her son started noticing changes first, he would get frustrated that she would not remember conversations. She apparently broke a lamp in his house and does not remember how she broke it. 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 This eventually came back later. She retired in September 2010 due to fibromyalgia. She would watch a movie and not remember  it later on. She has noticed word-finding difficulties for several years, which "makes me crazy," stating she is a "word nerd" and loves words, so this is frustrating for her. A few months ago she unlocked the door to her apartment, walked in, and could not recall how to get the key out of the door. She had to leave the door cracked and later came back to it. She lives alone and states she frequently forgets bill payments ("my whole life"). She has always gotten lost driving, and uses a GPS all the time. She denies missing medications, but is looking into getting a pillbox. Her father was diagnosed with Alzheimer's disease in his 38s or 72s. She denies any significant head injuries, rarely drinks alcohol.  She reports a 1-year history of symptoms suggestive of REM behavior disorder. She would have vivid dreams where she is fighting someone, and has woken up on the floor because she fell out of bed fighting, she has punched her nightstand and broke her coffee cup. She has tremors in both hands. No anosmia. She has been having constipation recently. She has had near-daily headaches for the past few years, lasting all day, with pressure over the frontal regions. No associated nausea/vomiting, she has some photo/phonosensitivity. She recently started alternating Celebrex and CBD oil, she has found they help by decreasing the intensity of her pain. She states on a good day her overall pain level is 3/10. She was diagnosed with fibromyalgia 7 years ago and has been taking Cymbalta. Lyrica was added a few years ago. She had neck and back pain, occasional paresthesias in her extremities.  She has burning pain in her right big toe. She denies any visual obscurations, diplopia, dysarthria/dysphagia, bladder dysfunction.   PAST MEDICAL HISTORY: Past Medical History:  Diagnosis Date  . Adenomatous colon polyp   . Allergy   . C. difficile colitis   . Diverticulosis   . Hyperlipidemia   . Hypertension   . IBS  (irritable bowel syndrome)   . Neuromuscular disorder [facility])     MEDICATIONS: Current Outpatient Medications on File Prior to Visit  Medication Sig Dispense Refill  . amLODipine (NORVASC) 5 MG tablet TAKE 1 TABLET(5 MG) BY MOUTH DAILY 90 tablet 1  . atorvastatin (LIPITOR) 10 MG tablet TAKE 1 TABLET(10 MG) BY MOUTH AT BEDTIME 90 tablet 0  . celecoxib (CELEBREX) 200 MG capsule TAKE 1 CAPSULE BY MOUTH 1 TO 2 TIMES DAILY AS NEEDED 180 capsule 0  . DULoxetine (CYMBALTA) 60 MG capsule TAKE 2 CAPSULE BY MOUTH EVERY DAY 180 capsule 0  . DULoxetine (CYMBALTA) 60 MG capsule TAKE 2 CAPSULE BY MOUTH EVERY DAY 180 capsule 0  . HYDROcodone-acetaminophen (NORCO) 10-325 MG tablet Take 1 tablet by mouth every 8 (eight) hours as needed. 30 tablet 0  . pregabalin (LYRICA) 75 MG capsule Take 1 capsule (75 mg total) by mouth 2 (two) times daily. 180 capsule 1  . traMADol (ULTRAM) 50 MG tablet TAKE 1 TABLET BY MOUTH EVERY 8 HOURS 90 tablet 0   No current facility-administered medications on file prior to visit.     ALLERGIES: Allergies  Allergen Reactions  . Paregoric     REACTION: HIVES  . Penicillins     REACTION: RASH  . Procaine Hcl     FAMILY HISTORY: Family History  Problem Relation Age of Onset  . Hypertension Mother   . Depression Mother   . Heart attack Mother   . Uterine cancer Mother   . Hypertension Paternal Grandmother   . Diabetes Maternal Grandmother   . Breast cancer Sister     SOCIAL HISTORY: Social History   Socioeconomic History  . Marital status: Single    Spouse name: Not on file  . Number of children: Not on file  . Years of education: Not on file  . Highest education level: Not on file  Occupational History  . Not on file  Social Needs  . Financial resource strain: Not on file  . Food insecurity:    Worry: Not on file    Inability: Not on file  . Transportation needs:    Medical: Not on file    Non-medical: Not on file  Tobacco Use  . Smoking status:  Former Smoker    Packs/day: 0.50    Types: Cigarettes    Last attempt to quit: 12/29/2011    Years since quitting: 5.6  . Smokeless tobacco: Former Network engineer and Sexual Activity  . Alcohol use: Yes    Comment: rarely  . Drug use: No  . Sexual activity: Not Currently  Lifestyle  . Physical activity:    Days per week: Not on file    Minutes per session: Not on file  . Stress: Not on file  Relationships  . Social connections:    Talks on phone: Not on file    Gets together: Not on file    Attends religious service: Not on file    Active member of club or organization: Not on file    Attends meetings of clubs or organizations: Not on file    Relationship status: Not on file  .  Intimate partner violence:    Fear of current or ex partner: Not on file    Emotionally abused: Not on file    Physically abused: Not on file    Forced sexual activity: Not on file  Other Topics Concern  . Not on file  Social History Narrative  . Not on file    REVIEW OF SYSTEMS: Constitutional: No fevers, chills, or sweats, no generalized fatigue, change in appetite Eyes: No visual changes, double vision, eye pain Ear, nose and throat: No hearing loss, ear pain, nasal congestion, sore throat Cardiovascular: No chest pain, palpitations Respiratory:  No shortness of breath at rest or with exertion, wheezes GastrointestinaI: No nausea, vomiting, diarrhea, abdominal pain, fecal incontinence Genitourinary:  No dysuria, urinary retention or frequency Musculoskeletal:  + neck pain, back pain Integumentary: No rash, pruritus, skin lesions Neurological: as above Psychiatric: + depression,no insomnia, anxiety Endocrine: No palpitations, fatigue, diaphoresis, mood swings, change in appetite, change in weight, increased thirst Hematologic/Lymphatic:  No anemia, purpura, petechiae. Allergic/Immunologic: no itchy/runny eyes, nasal congestion, recent allergic reactions, rashes  PHYSICAL EXAM: Vitals:    09/05/17 1456  BP: (!) 164/80  Pulse: 86  SpO2: 95%   General: No acute distress Head:  Normocephalic/atraumatic Neck: supple, no paraspinal tenderness, full range of motion Heart:  Regular rate and rhythm Lungs:  Clear to auscultation bilaterally Back: No paraspinal tenderness Skin/Extremities: No rash, no edema Neurological Exam: alert and oriented to person, place, and time. No aphasia or dysarthria. Fund of knowledge is appropriate.  Recent and remote memory are intact.  Attention and concentration are normal.    Able to name objects and repeat phrases. CDT 5/5 MMSE - Mini Mental State Exam 09/05/2017  Orientation to time 5  Orientation to Place 5  Registration 3  Attention/ Calculation 5  Recall 3  Language- name 2 objects 2  Language- repeat 1  Language- follow 3 step command 3  Language- read & follow direction 1  Write a sentence 1  Copy design 1  Total score 30    Cranial nerves: Pupils equal, round, reactive to light.  Extraocular movements intact with no nystagmus. Visual fields full. Facial sensation intact. No facial asymmetry. Tongue, uvula, palate midline.  Motor: Bulk and tone normal, muscle strength 5/5 throughout with no pronator drift.  Sensation to light touch intact.  No extinction to double simultaneous stimulation.  Deep tendon reflexes 2+ throughout, toes downgoing.  Finger to nose testing intact.  Gait narrow-based and steady, able to tandem walk adequately.  Romberg negative.  IMPRESSION: This is a pleasant 62 yo RH woman with a history of  hypertension, hyperlipidemia, fibromyalgia/chronic pain, who presented with cognitive changes. Neuropsychological testing did not find any evidence of a cognitive disorder, there was moderate major depressive disorder and chronic pain noted as likely cause of cognitive complaints. MMSE today 30/30. We discussed the results of Neuropsychological testing, she continues to deal with pain and depression, but has is not sure she  has time for seeing Behavioral Health. We discussed that if cognitive symptoms worsen, would proceed with making time for Behavioral Health. We again discussed the importance of control of vascular risk factors, physical exercise, and brain stimulation exercises for brain health. She will follow-up as needed and knows to call for any changes  Thank you for allowing me to participate in her care.  Please do not hesitate to call for any questions or concerns.  The duration of this appointment visit was 25 minutes of face-to-face time with  the patient.  Greater than 50% of this time was spent in counseling, explanation of diagnosis, planning of further management, and coordination of care.   Ellouise Newer, M.D.   CC: Dr. Cheri Rous

## 2017-09-23 ENCOUNTER — Other Ambulatory Visit: Payer: Self-pay | Admitting: Family Medicine

## 2017-10-10 ENCOUNTER — Other Ambulatory Visit: Payer: Self-pay | Admitting: Family Medicine

## 2017-10-10 DIAGNOSIS — G894 Chronic pain syndrome: Secondary | ICD-10-CM

## 2017-10-11 NOTE — Telephone Encounter (Signed)
[facility] [address] requesting refill for tramadol  Database ran 08/30/17 and is media for review    Last written: 08/30/17 Last ov: 07/22/17 Next ov: 01/19/18 Contract: 12/27/17 UDS: 01/19/18

## 2017-10-27 ENCOUNTER — Other Ambulatory Visit: Payer: Self-pay | Admitting: Family Medicine

## 2017-10-27 DIAGNOSIS — G894 Chronic pain syndrome: Secondary | ICD-10-CM

## 2017-10-31 MED ORDER — HYDROCODONE-ACETAMINOPHEN 10-325 MG PO TABS: 1.0000 | ORAL_TABLET | Freq: Three times a day (TID) | ORAL | 0 refills | Status: DC | PRN

## 2017-10-31 NOTE — Telephone Encounter (Signed)
Will need ov q76m -- I will refill this  month

## 2017-10-31 NOTE — Telephone Encounter (Signed)
Requesting: [place]: 12/27/16 UDS:07/22/17 Low risk Last OV: 07/22/17 Next OV: 01/19/18 Last Refill: 07/22/17   #30   Please advise

## 2017-11-01 ENCOUNTER — Other Ambulatory Visit: Payer: Self-pay | Admitting: Family Medicine

## 2017-11-01 DIAGNOSIS — G894 Chronic pain syndrome: Secondary | ICD-10-CM

## 2017-11-01 MED ORDER — HYDROCODONE-ACETAMINOPHEN 10-325 MG PO TABS: 1.0000 | ORAL_TABLET | Freq: Three times a day (TID) | ORAL | 0 refills | Status: DC | PRN

## 2017-11-01 NOTE — Addendum Note (Signed)
Addended by: Kelle Darting A on: 11/01/2017 04:14 PM   Modules accepted: Orders

## 2017-11-01 NOTE — Telephone Encounter (Signed)
Pt was notified pt and has scheduled OV for 01/19/18. Pt states she did not get RX that was sent on 10/31/17.  Chart review shows Rx did not transmit electronically. Can you please re-send RX? Advised pt to check with her pharmacy tomorrow.

## 2017-11-26 ENCOUNTER — Other Ambulatory Visit: Payer: Self-pay | Admitting: Family Medicine

## 2017-11-26 DIAGNOSIS — G894 Chronic pain syndrome: Secondary | ICD-10-CM

## 2017-11-29 ENCOUNTER — Other Ambulatory Visit: Payer: Self-pay | Admitting: Family Medicine

## 2017-11-29 DIAGNOSIS — G894 Chronic pain syndrome: Secondary | ICD-10-CM

## 2017-11-29 MED ORDER — TRAMADOL HCL 50 MG PO TABS: 50.0000 mg | ORAL_TABLET | Freq: Three times a day (TID) | ORAL | 0 refills | Status: DC

## 2017-11-29 NOTE — Telephone Encounter (Signed)
Database ran and is on your desk for review.  Last filled per database: 10/12/17 Last written: 10/12/17 Last ov: 07/22/17 Next ov: 01/19/18 Contract: 12/27/17 UDS: 01/19/18

## 2017-11-29 NOTE — Telephone Encounter (Signed)
Requesting: Tramadol 50mg  every 8hr  Contract: yes, 2016 UDS: 06/27/14 Last OV: 07/22/17 Next Ov: 01/19/18 Last refill: 10/12/17, #90, 0RF Database: OD risk score 230/999. Report printed off for Dr. Etter Sjogren for review.  Please advise.

## 2017-12-17 ENCOUNTER — Other Ambulatory Visit: Payer: Self-pay | Admitting: Family Medicine

## 2018-01-10 ENCOUNTER — Telehealth: Payer: Self-pay | Admitting: Family Medicine

## 2018-01-10 NOTE — Telephone Encounter (Signed)
Copied from [place] [id number]. Topic: Quick Communication - See Telephone Encounter >> [name] 10, 2018  4:48 PM Genella Rife H wrote: CRM for notification. See Telephone encounter for: 01/10/18.  Left voicemail appt needs to be rescheduled per pcp

## 2018-01-19 ENCOUNTER — Ambulatory Visit: Payer: Medicare Other | Admitting: Family Medicine

## 2018-01-19 ENCOUNTER — Other Ambulatory Visit: Payer: Self-pay | Admitting: Family Medicine

## 2018-01-19 DIAGNOSIS — G894 Chronic pain syndrome: Secondary | ICD-10-CM

## 2018-01-19 NOTE — Telephone Encounter (Signed)
Refill of hydrocodone  LOV 07/22/17 Dr. Carollee Herter  LRF 11/01/17  #30  0 refills   [facility] DR[facility] [place], [address] AT [facility] OF [place] [address] Phone: [phone] Fax: [phone]

## 2018-01-19 NOTE — Telephone Encounter (Signed)
Copied from [place] [id number]. Topic: Quick Communication - See Telephone Encounter >> [name], 2018  6:12 PM Conception Chancy, NT wrote: CRM for notification. See Telephone encounter for: 01/19/18.  Patient is calling and requesting a refill on HYDROcodone-acetaminophen (NORCO) 10-325 MG tablet. Please advise.  [facility] STORE [place], [place] AT [facility] [address] [place] [address] Phone: [phone] Fax: [phone]

## 2018-01-20 MED ORDER — HYDROCODONE-ACETAMINOPHEN 10-325 MG PO TABS: 1.0000 | ORAL_TABLET | Freq: Three times a day (TID) | ORAL | 0 refills | Status: DC | PRN

## 2018-01-26 ENCOUNTER — Ambulatory Visit: Payer: Medicare Other | Admitting: Family Medicine

## 2018-01-26 ENCOUNTER — Encounter: Payer: Self-pay | Admitting: Family Medicine

## 2018-01-26 VITALS — BP 144/78 | HR 80 | Temp 98.3°F | Resp 16 | Ht 68.5 in | Wt 225.8 lb

## 2018-01-26 DIAGNOSIS — H9313 Tinnitus, bilateral: Secondary | ICD-10-CM

## 2018-01-26 DIAGNOSIS — Z79899 Other long term (current) drug therapy: Secondary | ICD-10-CM | POA: Diagnosis not present

## 2018-01-26 DIAGNOSIS — E785 Hyperlipidemia, unspecified: Secondary | ICD-10-CM | POA: Diagnosis not present

## 2018-01-26 DIAGNOSIS — E049 Nontoxic goiter, unspecified: Secondary | ICD-10-CM

## 2018-01-26 DIAGNOSIS — I10 Essential (primary) hypertension: Secondary | ICD-10-CM

## 2018-01-26 DIAGNOSIS — G894 Chronic pain syndrome: Secondary | ICD-10-CM | POA: Diagnosis not present

## 2018-01-26 DIAGNOSIS — Z1239 Encounter for other screening for malignant neoplasm of breast: Secondary | ICD-10-CM

## 2018-01-26 DIAGNOSIS — Z1231 Encounter for screening mammogram for malignant neoplasm of breast: Secondary | ICD-10-CM

## 2018-01-26 DIAGNOSIS — Z87891 Personal history of nicotine dependence: Secondary | ICD-10-CM

## 2018-01-26 NOTE — Patient Instructions (Signed)

## 2018-01-26 NOTE — Progress Notes (Signed)
Patient ID: Veronica Solomon, female    DOB: Mar 06, 1956  Age: 62 y.o. MRN: [id number]    Subjective:  Subjective  HPI Veronica Solomon presents for f/u pain meds and cholesterol  She is c/o con't tinnitus and dec hearing--- worse in R ear.  She also states her dentist told her she had asymmetry in her neck.   Review of Systems  Constitutional: Negative for activity change, appetite change, fatigue and unexpected weight change.  HENT: Negative.   Respiratory: Negative for cough and shortness of breath.   Cardiovascular: Negative for chest pain and palpitations.  Endocrine: Negative.   Musculoskeletal: Positive for arthralgias and myalgias.  Psychiatric/Behavioral: Negative for behavioral problems and dysphoric mood. The patient is not nervous/anxious.     History Past Medical History:  Diagnosis Date  . Adenomatous colon polyp   . Allergy   . C. difficile colitis   . Diverticulosis   . Hyperlipidemia   . Hypertension   . IBS (irritable bowel syndrome)   . Neuromuscular disorder [facility])     She has a past surgical history that includes Abdominal hysterectomy (2002).   Her family history includes Breast cancer in her sister; Depression in her mother; Diabetes in her maternal grandmother; Heart attack in her mother; Hypertension in her mother and paternal grandmother; Uterine cancer in her mother.She reports that she quit smoking about 6 years ago. Her smoking use included cigarettes. She smoked 0.50 packs per day. She has quit using smokeless tobacco. She reports that she drinks alcohol. She reports that she does not use drugs.  Current Outpatient Medications on File Prior to Visit  Medication Sig Dispense Refill  . amLODipine (NORVASC) 5 MG tablet TAKE 1 TABLET(5 MG) BY MOUTH DAILY 90 tablet 0  . atorvastatin (LIPITOR) 10 MG tablet TAKE 1 TABLET(10 MG) BY MOUTH AT BEDTIME 90 tablet 0  . celecoxib (CELEBREX) 200 MG capsule TAKE 1 CAPSULE BY MOUTH 1 TO 2 TIMES DAILY AS NEEDED  180 capsule 0  . DULoxetine (CYMBALTA) 60 MG capsule TAKE 2 CAPSULES BY MOUTH EVERY DAY 180 capsule 0  . HYDROcodone-acetaminophen (NORCO) 10-325 MG tablet Take 1 tablet by mouth every 8 (eight) hours as needed. 30 tablet 0  . pregabalin (LYRICA) 75 MG capsule Take 1 capsule (75 mg total) by mouth 2 (two) times daily. 180 capsule 1  . traMADol (ULTRAM) 50 MG tablet Take 1 tablet (50 mg total) by mouth every 8 (eight) hours. 90 tablet 0   No current facility-administered medications on file prior to visit.      Objective:  Objective  Physical Exam  Constitutional: She is oriented to person, place, and time. She appears well-developed and well-nourished.  HENT:  Head: Normocephalic and atraumatic.  Eyes: Conjunctivae and EOM are normal.  Neck: Normal range of motion. Neck supple. No JVD present. Carotid bruit is not present. Thyromegaly present.  Cardiovascular: Normal rate, regular rhythm and normal heart sounds.  No murmur heard. Pulmonary/Chest: Effort normal and breath sounds normal. No respiratory distress. She has no wheezes. She has no rales. She exhibits no tenderness.  Musculoskeletal: She exhibits no edema.  Neurological: She is alert and oriented to person, place, and time.  Psychiatric: She has a normal mood and affect.  Nursing note and vitals reviewed.  BP (!) 144/78   Pulse 80   Temp 98.3 F (36.8 C) (Oral)   Resp 16   Ht 5' 8.5" (1.74 m)   Wt 225 lb 12.8 oz (102.4 kg)  SpO2 96%   BMI 33.83 kg/m  Wt Readings from Last 3 Encounters:  01/26/18 225 lb 12.8 oz (102.4 kg)  09/05/17 236 lb (107 kg)  07/22/17 236 lb (107 kg)     Lab Results  Component Value Date   WBC 8.9 07/03/2015   HGB 13.8 07/03/2015   HCT 42.2 07/03/2015   PLT 262.0 07/03/2015   GLUCOSE 101 (H) 01/26/2018   CHOL 211 (H) 01/26/2018   TRIG 174.0 (H) 01/26/2018   HDL 58.70 01/26/2018   LDLDIRECT 195.7 01/12/2010   LDLCALC 117 (H) 01/26/2018   ALT 18 01/26/2018   AST 19 01/26/2018   NA  140 01/26/2018   K 5.2 (H) 01/26/2018   CL 101 01/26/2018   CREATININE 0.87 01/26/2018   BUN 19 01/26/2018   CO2 34 (H) 01/26/2018   TSH 1.13 01/26/2018   HGBA1C 5.8 12/30/2014   MICROALBUR 4.1 (H) 11/24/2012    No results found.   Assessment & Plan:  Plan  I am having Veronica Solomon maintain her celecoxib, pregabalin, traMADol, atorvastatin, DULoxetine, amLODipine, and HYDROcodone-acetaminophen.  No orders of the defined types were placed in this encounter.   Problem List Items Addressed This Visit      Unprioritized   Chronic pain syndrome - Primary    Stable with meds  Uds/ contract / database utd      Relevant Orders   Pain Mgmt, Profile 8 w/Conf, U   Pain Mgmt, Tramadol w/medMATCH, U   Essential hypertension    Well controlled, no changes to meds. Encouraged heart healthy diet such as the DASH diet and exercise as tolerated.       Hyperlipidemia LDL goal <100    Encouraged heart healthy diet, increase exercise, avoid trans fats, consider a krill oil cap daily      Relevant Orders   Lipid panel (Completed)   Comprehensive metabolic panel (Completed)    Other Visit Diagnoses    High risk medication use       Relevant Orders   Pain Mgmt, Profile 8 w/Conf, U   Pain Mgmt, Tramadol w/medMATCH, U   Goiter       Relevant Orders   TSH (Completed)   US THYROID   Tinnitus of both ears       Relevant Orders   Ambulatory referral to ENT   History of smoking 30 or more pack years       Relevant Orders   Ambulatory Referral for Lung Cancer Scre   Breast cancer screening       Relevant Orders   MM 3D SCREEN BREAST BILATERAL      Follow-up: Return in about 3 months (around 04/28/2018), or if symptoms worsen or fail to improve, for pain meds.  [name], DO

## 2018-01-27 LAB — COMPREHENSIVE METABOLIC PANEL
ALT: 18 U/L (ref 0–35)
AST: 19 U/L (ref 0–37)
Albumin: 4.6 g/dL (ref 3.5–5.2)
Alkaline Phosphatase: 87 U/L (ref 39–117)
BILIRUBIN TOTAL: 0.7 mg/dL (ref 0.2–1.2)
BUN: 19 mg/dL (ref 6–23)
CO2: 34 meq/L — AB (ref 19–32)
CREATININE: 0.87 mg/dL (ref 0.40–1.20)
Calcium: 10.5 mg/dL (ref 8.4–10.5)
Chloride: 101 mEq/L (ref 96–112)
GFR: 70.26 mL/min (ref >60.00)
GLUCOSE: 101 mg/dL — AB (ref 70–99)
Potassium: 5.2 mEq/L — ABNORMAL HIGH (ref 3.5–5.1)
SODIUM: 140 meq/L (ref 135–145)
Total Protein: 7.6 g/dL (ref 6.0–8.3)

## 2018-01-27 LAB — LIPID PANEL
CHOL/HDL RATIO: 4
Cholesterol: 211 mg/dL — ABNORMAL HIGH (ref 0–200)
HDL: 58.7 mg/dL (ref >39.00)
LDL Cholesterol: 117 mg/dL — ABNORMAL HIGH (ref 0–99)
NONHDL: 152.09
Triglycerides: 174 mg/dL — ABNORMAL HIGH (ref 0.0–149.0)
VLDL: 34.8 mg/dL (ref 0.0–40.0)

## 2018-01-27 LAB — TSH: TSH: 1.13 u[IU]/mL (ref 0.35–4.50)

## 2018-01-28 NOTE — Assessment & Plan Note (Signed)
Well controlled, no changes to meds. Encouraged heart healthy diet such as the DASH diet and exercise as tolerated.  °

## 2018-01-28 NOTE — Assessment & Plan Note (Signed)
Stable with meds  Uds/ contract / database utd

## 2018-01-28 NOTE — Assessment & Plan Note (Signed)
Encouraged heart healthy diet, increase exercise, avoid trans fats, consider a krill oil cap daily 

## 2018-01-30 ENCOUNTER — Other Ambulatory Visit: Payer: Self-pay | Admitting: Family Medicine

## 2018-01-30 LAB — PAIN MGMT, PROFILE 8 W/CONF, U
6 Acetylmorphine: NEGATIVE ng/mL (ref <10)
Alcohol Metabolites: NEGATIVE ng/mL (ref <500)
Amphetamines: NEGATIVE ng/mL (ref <500)
BENZODIAZEPINES: NEGATIVE ng/mL (ref <100)
BUPRENORPHINE, URINE: NEGATIVE ng/mL (ref <5)
COCAINE METABOLITE: NEGATIVE ng/mL (ref <150)
Codeine: NEGATIVE ng/mL (ref <50)
Creatinine: 136.5 mg/dL
Hydrocodone: NEGATIVE ng/mL (ref <50)
Hydromorphone: NEGATIVE ng/mL (ref <50)
MARIJUANA METABOLITE: NEGATIVE ng/mL (ref <20)
MDMA: NEGATIVE ng/mL (ref <500)
Morphine: NEGATIVE ng/mL (ref <50)
Norhydrocodone: 113 ng/mL — ABNORMAL HIGH (ref <50)
Opiates: POSITIVE ng/mL — AB (ref <100)
Oxidant: NEGATIVE ug/mL (ref <200)
Oxycodone: NEGATIVE ng/mL (ref <100)
pH: 7.2 (ref 4.5–9.0)

## 2018-01-30 LAB — PAIN MGMT, TRAMADOL W/MEDMATCH, U
Desmethyltramadol: 1964 ng/mL — ABNORMAL HIGH (ref <100)
TRAMADOL: 8889 ng/mL — AB (ref <100)

## 2018-01-30 MED ORDER — ATORVASTATIN CALCIUM 20 MG PO TABS: 20.0000 mg | ORAL_TABLET | Freq: Every day | ORAL | 2 refills | Status: DC

## 2018-02-01 ENCOUNTER — Ambulatory Visit (HOSPITAL_BASED_OUTPATIENT_CLINIC_OR_DEPARTMENT_OTHER)
Admission: RE | Admit: 2018-02-01 | Discharge: 2018-02-01 | Disposition: A | Discharge status: Home / Self Care | Payer: Medicare Other | Source: Ambulatory Visit | Attending: Family Medicine | Admitting: Family Medicine

## 2018-02-01 DIAGNOSIS — E049 Nontoxic goiter, unspecified: Secondary | ICD-10-CM | POA: Diagnosis not present

## 2018-02-01 DIAGNOSIS — Z1231 Encounter for screening mammogram for malignant neoplasm of breast: Secondary | ICD-10-CM | POA: Diagnosis present

## 2018-02-01 DIAGNOSIS — Z1239 Encounter for other screening for malignant neoplasm of breast: Secondary | ICD-10-CM

## 2018-02-03 ENCOUNTER — Encounter (HOSPITAL_COMMUNITY): Payer: Self-pay | Admitting: *Deleted

## 2018-02-03 ENCOUNTER — Emergency Department (HOSPITAL_COMMUNITY): Payer: No Typology Code available for payment source

## 2018-02-03 ENCOUNTER — Emergency Department (HOSPITAL_COMMUNITY)
Admission: EM | Admit: 2018-02-03 | Discharge: 2018-02-03 | Disposition: A | Discharge status: Home / Self Care | Payer: No Typology Code available for payment source | Attending: Emergency Medicine | Admitting: Emergency Medicine

## 2018-02-03 DIAGNOSIS — I10 Essential (primary) hypertension: Secondary | ICD-10-CM | POA: Diagnosis not present

## 2018-02-03 DIAGNOSIS — Y93K9 Activity, other involving animal care: Secondary | ICD-10-CM | POA: Insufficient documentation

## 2018-02-03 DIAGNOSIS — Y990 Civilian activity done for income or pay: Secondary | ICD-10-CM | POA: Diagnosis not present

## 2018-02-03 DIAGNOSIS — S68620A Partial traumatic transphalangeal amputation of right index finger, initial encounter: Secondary | ICD-10-CM | POA: Diagnosis present

## 2018-02-03 DIAGNOSIS — Z23 Encounter for immunization: Secondary | ICD-10-CM | POA: Insufficient documentation

## 2018-02-03 DIAGNOSIS — S68119A Complete traumatic metacarpophalangeal amputation of unspecified finger, initial encounter: Secondary | ICD-10-CM

## 2018-02-03 DIAGNOSIS — Y9289 Other specified places as the place of occurrence of the external cause: Secondary | ICD-10-CM | POA: Diagnosis not present

## 2018-02-03 DIAGNOSIS — Z79899 Other long term (current) drug therapy: Secondary | ICD-10-CM | POA: Diagnosis not present

## 2018-02-03 DIAGNOSIS — W540XXA Bitten by dog, initial encounter: Secondary | ICD-10-CM | POA: Diagnosis not present

## 2018-02-03 DIAGNOSIS — Z87891 Personal history of nicotine dependence: Secondary | ICD-10-CM | POA: Diagnosis not present

## 2018-02-03 MED ORDER — ACETAMINOPHEN 325 MG PO TABS: 650.0000 mg | ORAL_TABLET | Freq: Four times a day (QID) | ORAL | Status: DC

## 2018-02-03 MED ORDER — MORPHINE SULFATE (PF) 4 MG/ML IV SOLN
4.0000 mg | Freq: Once | INTRAVENOUS | Status: AC
  Administered 2018-02-03: 4 mg via INTRAVENOUS
  Filled 2018-02-03: qty 1

## 2018-02-03 MED ORDER — DOXYCYCLINE HYCLATE 100 MG PO TABS
100.0000 mg | ORAL_TABLET | Freq: Once | ORAL | Status: AC
  Administered 2018-02-03: 100 mg via ORAL
  Filled 2018-02-03: qty 1

## 2018-02-03 MED ORDER — DOXYCYCLINE HYCLATE 100 MG PO TABS: 100.0000 mg | ORAL_TABLET | Freq: Two times a day (BID) | ORAL | 0 refills | Status: DC

## 2018-02-03 MED ORDER — LIDOCAINE HCL (PF) 1 % IJ SOLN
30.0000 mL | Freq: Once | INTRAMUSCULAR | Status: AC
  Administered 2018-02-03: 30 mL
  Filled 2018-02-03: qty 30

## 2018-02-03 MED ORDER — TETANUS-DIPHTH-ACELL PERTUSSIS 5-2.5-18.5 LF-MCG/0.5 IM SUSP
0.5000 mL | Freq: Once | INTRAMUSCULAR | Status: AC
  Administered 2018-02-03: 0.5 mL via INTRAMUSCULAR
  Filled 2018-02-03: qty 0.5

## 2018-02-03 NOTE — ED Provider Notes (Signed)
Medical screening examination/treatment/procedure(s) were conducted as a shared visit with non-physician practitioner(s) and myself.  I personally evaluated the patient during the encounter.  None Patient was working at the Programmer, systems.  She was holding a dog toy and the dog bit down on it hard.  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  This was not a bite of aggression.  Patient is alert and nontoxic.  She has pain but is in no distress.  Calm and appropriate.  Tip of index finger amputated as illustrated in PA-C note.  Agree with plan of management.   Charlesetta Shanks, MD 02/05/18 [phone]

## 2018-02-03 NOTE — Consult Note (Signed)
ORTHOPAEDIC CONSULTATION HISTORY & PHYSICAL REQUESTING PHYSICIAN: Charlesetta Shanks, MD  Chief Complaint: Right index finger dog bite  HPI: Veronica Solomon is a 62 y.o. female who was working at the [employer], systems, when she sustained a dog bite, amputating the tip of the right index finger.  She presented for further evaluation, with pain, bleeding, and missing a portion of the tip of the digit.  Her tetanus is been addressed, and she is to receive doxycycline.  Past Medical History:  Diagnosis Date  . Adenomatous colon polyp   . Allergy   . C. difficile colitis   . Diverticulosis   . Hyperlipidemia   . Hypertension   . IBS (irritable bowel syndrome)   . Neuromuscular disorder [facility])    Past Surgical History:  Procedure Laterality Date  . ABDOMINAL HYSTERECTOMY  2002   fibroids   Social History   Socioeconomic History  . Marital status: Single    Spouse name: Not on file  . Number of children: Not on file  . Years of education: Not on file  . Highest education level: Not on file  Occupational History  . Not on file  Social Needs  . Financial resource strain: Not on file  . Food insecurity:    Worry: Not on file    Inability: Not on file  . Transportation needs:    Medical: Not on file    Non-medical: Not on file  Tobacco Use  . Smoking status: Former Smoker    Packs/day: 0.50    Types: Cigarettes    Last attempt to quit: 12/29/2011    Years since quitting: 6.1  . Smokeless tobacco: Former Network engineer and Sexual Activity  . Alcohol use: Yes    Comment: rarely  . Drug use: No  . Sexual activity: Not Currently  Lifestyle  . Physical activity:    Days per week: Not on file    Minutes per session: Not on file  . Stress: Not on file  Relationships  . Social connections:    Talks on phone: Not on file    Gets together: Not on file    Attends religious service: Not on file    Active member of club or organization: Not on file    Attends meetings of  clubs or organizations: Not on file    Relationship status: Not on file  Other Topics Concern  . Not on file  Social History Narrative  . Not on file   Family History  Problem Relation Age of Onset  . Hypertension Mother   . Depression Mother   . Heart attack Mother   . Uterine cancer Mother   . Hypertension Paternal Grandmother   . Diabetes Maternal Grandmother   . Breast cancer Sister    Allergies  Allergen Reactions  . Paregoric     REACTION: HIVES  . Penicillins     REACTION: RASH  . Procaine Hcl    Prior to Admission medications   Medication Sig Start Date End Date Taking? Authorizing Provider  amLODipine (NORVASC) 5 MG tablet TAKE 1 TABLET(5 MG) BY MOUTH DAILY 12/20/17   Carollee Herter, Alferd Apa, DO  atorvastatin (LIPITOR) 20 MG tablet Take 1 tablet (20 mg total) by mouth daily. 01/30/18   Roma Schanz R, DO  celecoxib (CELEBREX) 200 MG capsule TAKE 1 CAPSULE BY MOUTH 1 TO 2 TIMES DAILY AS NEEDED 07/01/17   Carollee Herter, Alferd Apa, DO  DULoxetine (CYMBALTA) 60 MG capsule TAKE 2 CAPSULES  BY MOUTH EVERY DAY 12/20/17   Carollee Herter, Alferd Apa, DO  HYDROcodone-acetaminophen (NORCO) 10-325 MG tablet Take 1 tablet by mouth every 8 (eight) hours as needed. 01/20/18   Mosie Lukes, MD  pregabalin (LYRICA) 75 MG capsule Take 1 capsule (75 mg total) by mouth 2 (two) times daily. 07/22/17   [name], DO  traMADol (ULTRAM) 50 MG tablet Take 1 tablet (50 mg total) by mouth every 8 (eight) hours. 11/29/17   Roma Schanz R, DO   No results found.  Positive ROS: All other systems have been reviewed and were otherwise negative with the exception of those mentioned in the HPI and as above.  Physical Exam: Vitals: Refer to EMR. Constitutional:  WD, WN, NAD HEENT:  NCAT, EOMI Neuro/Psych:  Alert & oriented to person, place, and time; appropriate mood & affect Lymphatic: No generalized extremity edema or lymphadenopathy Extremities / MSK:  The extremities are normal with  respect to appearance, ranges of motion, joint stability, muscle strength/tone, sensation, & perfusion except as otherwise noted:  Right index fingertip is missing, including the terminal 2 to 3 mm of sterile matrix.  The transection sterile matrix is irregular, with it extending intact further radially than ulnarly.  With palpation the tip of the phalanx can be appreciated by palpation, but does not extend distal to the volar pulp that is intact.  Flexion extension of the DIP joint is intact.  There are intact portions of the nail plate proximal to the transection, with the nail plate divided into 2 pieces, by laceration that does not extend all the way through the full-thickness of the nailbed.  Assessment: Right index fingertip dog bite injury, with amputation of the terminal tip  Plan/procedure: Digital block was performed by EDP.  The tourniquet on the digit, the wound was copiously scrubbed and irrigated under running water at the sink.  Was then examined by me.  The remaining portions of nail plate were lifted off of the underlying nail bed with a surgical instrument and then removed, so that the entire nail bed was now exposed for visualization.  Discussion was held with patient regarding options for proceeding, and we elected to apply dressing today and allow for secondary healing to begin before committing to any firm course of action, depending upon the progress.  Dressing was applied, she will receive doxycycline in the ER and be discharged on such as well.  Follow-up in the office, likely on Wednesday or Thursday.  Rayvon Char Grandville Silos, [address], [place]  [address] Office: [phone] Mobile: [phone]  02/03/2018, 4:35 PM

## 2018-02-03 NOTE — ED Notes (Signed)
Lidocaine at bedside.

## 2018-02-03 NOTE — Discharge Instructions (Addendum)
Keep dressing on and clean/dry Take your antibiotics until gone

## 2018-02-03 NOTE — ED Provider Notes (Signed)
[place] [facility] DEPT Provider Note   CSN: [id number] Arrival date & time: 02/03/18  1516     History   Chief Complaint Chief Complaint  Patient presents with  . Animal Bite    right index finger    HPI [facility] Hohman is a 62 y.o. female with history of hypertension, hyperlipidemia, IBS, fibromyalgia who presents with a dog bite to right index finger.  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  She reports her hand got between the dog toy.  She is right-handed.  The dog is up-to-date on his rabies vaccination.  The patient is not up-to-date on tetanus.  Patient has throbbing pain to the finger.  She has full range of motion.  She denies any other injuries or pain.  Last PO 1pm.  She reports taking her daily tramadol and Celebrex this morning.  HPI  Past Medical History:  Diagnosis Date  . Adenomatous colon polyp   . Allergy   . C. difficile colitis   . Diverticulosis   . Hyperlipidemia   . Hypertension   . IBS (irritable bowel syndrome)   . Neuromuscular disorder [facility])     Patient Active Problem List   Diagnosis Date Noted  . Accessory skin tags 12/27/2016  . Chronic daily headache 09/01/2016  . Memory loss due to medical condition 06/30/2016  . Hyperlipidemia LDL goal <100 12/30/2014  . Obesity (BMI 30-39.9) 06/25/2013  . Numerous moles 11/24/2012  . Chronic pain syndrome 09/29/2010  . COUGH 12/04/2009  . CONSTIPATION 07/18/2009  . Myalgia and myositis, unspecified 04/14/2009  . DEPRESSION 03/19/2009  . PARESTHESIA 03/19/2009  . DIARRHEA, INFECTIOUS 03/18/2009  . DIARRHEA 03/18/2009  . PERSONAL HX COLONIC POLYPS 03/18/2009  . FATIGUE 03/10/2009  . Essential hypertension 02/28/2009  . IBS 02/14/2009  . POSTMENOPAUSAL STATUS 12/30/2008  . 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 04/01/2008  . HEADACHE 04/01/2008  . ACUTE BRONCHITIS 03/07/2008  . URI 07/10/2007    Past Surgical History:  Procedure  Laterality Date  . ABDOMINAL HYSTERECTOMY  2002   fibroids     OB History   None      Home Medications    Prior to Admission medications   Medication Sig Start Date End Date Taking? Authorizing Provider  acetaminophen (TYLENOL) 325 MG tablet Take 2 tablets (650 mg total) by mouth every 6 (six) hours. 02/03/18   Milly Jakob, MD  amLODipine (NORVASC) 5 MG tablet TAKE 1 TABLET(5 MG) BY MOUTH DAILY 12/20/17   Carollee Herter, Alferd Apa, DO  atorvastatin (LIPITOR) 20 MG tablet Take 1 tablet (20 mg total) by mouth daily. 01/30/18   Roma Schanz R, DO  celecoxib (CELEBREX) 200 MG capsule TAKE 1 CAPSULE BY MOUTH 1 TO 2 TIMES DAILY AS NEEDED 07/01/17   Carollee Herter, Alferd Apa, DO  doxycycline (VIBRA-TABS) 100 MG tablet Take 1 tablet (100 mg total) by mouth 2 (two) times daily. 02/03/18   Milly Jakob, MD  DULoxetine (CYMBALTA) 60 MG capsule TAKE 2 CAPSULES BY MOUTH EVERY DAY 12/20/17   Carollee Herter, Alferd Apa, DO  HYDROcodone-acetaminophen (NORCO) 10-325 MG tablet Take 1 tablet by mouth every 8 (eight) hours as needed. 01/20/18   Mosie Lukes, MD  pregabalin (LYRICA) 75 MG capsule Take 1 capsule (75 mg total) by mouth 2 (two) times daily. 07/22/17   [name], DO  traMADol (ULTRAM) 50 MG tablet Take 1 tablet (50 mg total) by mouth every 8 (eight)  hours. 11/29/17   [name], DO    Family History Family History  Problem Relation Age of Onset  . Hypertension Mother   . Depression Mother   . Heart attack Mother   . Uterine cancer Mother   . Hypertension Paternal Grandmother   . Diabetes Maternal Grandmother   . Breast cancer Sister     Social History Social History   Tobacco Use  . Smoking status: Former Smoker    Packs/day: 0.50    Types: Cigarettes    Last attempt to quit: 12/29/2011    Years since quitting: 6.1  . Smokeless tobacco: Former Network engineer Use Topics  . Alcohol use: Yes    Comment: rarely  . Drug use: No     Allergies   Paregoric;  Penicillins; and Procaine hcl   Review of Systems Review of Systems  Constitutional: Negative for chills and fever.  HENT: Negative for facial swelling and sore throat.   Respiratory: Negative for shortness of breath.   Cardiovascular: Negative for chest pain.  Gastrointestinal: Negative for abdominal pain, nausea and vomiting.  Genitourinary: Negative for dysuria.  Musculoskeletal: Negative for back pain.  Skin: Positive for wound. Negative for rash.  Neurological: Negative for headaches.  Psychiatric/Behavioral: The patient is not nervous/anxious.      Physical Exam Updated Vital Signs BP (!) 166/95 (BP Location: Left Arm)   Pulse 72   Temp 98.2 F (36.8 C) (Oral)   Resp 18   SpO2 100%   Physical Exam  Constitutional: She appears well-developed and well-nourished. No distress.  HENT:  Head: Normocephalic and atraumatic.  Mouth/Throat: Oropharynx is clear and moist. No oropharyngeal exudate.  Eyes: Pupils are equal, round, and reactive to light. Conjunctivae are normal. Right eye exhibits no discharge. Left eye exhibits no discharge. No scleral icterus.  Neck: Normal range of motion. Neck supple. No thyromegaly present.  Cardiovascular: Normal rate, regular rhythm, normal heart sounds and intact distal pulses. Exam reveals no gallop and no friction rub.  No murmur heard. Pulmonary/Chest: Effort normal and breath sounds normal. No stridor. No respiratory distress. She has no wheezes. She has no rales.  Abdominal: Soft. Bowel sounds are normal. She exhibits no distension. There is no tenderness. There is no rebound and no guarding.  Musculoskeletal: She exhibits no edema.  R index: Distal fingertip amputation involving the nail with active bleeding, full range of motion of the digit; see photo  Lymphadenopathy:    She has no cervical adenopathy.  Neurological: She is alert. Coordination normal.  Skin: Skin is warm and dry. No rash noted. She is not diaphoretic. No pallor.    Psychiatric: She has a normal mood and affect.  Nursing note and vitals reviewed.        ED Treatments / Results  Labs (all labs ordered are listed, but only abnormal results are displayed) Labs Reviewed - No data to display  EKG None  Radiology Dg Finger Index Right  Result Date: 02/03/2018 CLINICAL DATA:  Dog bite. Initial encounter. Laceration to tip of finger. EXAM: RIGHT INDEX FINGER 2+V COMPARISON:  None. FINDINGS: There is a soft tissue amputation of the tip of the right index finger. A comminuted fracture is present through the tuft of the distal phalanx with a portion of tuft absent. IMPRESSION: Amputation of the distal tip of the index finger involving the tuft of the distal phalanx. Electronically Signed   By: San Morelle M.D.   On: 02/03/2018 16:59  Procedures .Nerve Block Date/Time: 02/03/2018 6:25 PM Performed by: Frederica Kuster, PA-C Authorized by: Frederica Kuster, PA-C   Consent:    Consent obtained:  Verbal   Consent given by:  Patient   Risks discussed:  Allergic reaction, infection, unsuccessful block, swelling and pain   Alternatives discussed:  No treatment Indications:    Indications:  Pain relief and procedural anesthesia Location:    Body area:  Upper extremity   Upper extremity nerve blocked: digital block.   Laterality:  Right Pre-procedure details:    Skin preparation:  2% chlorhexidine Procedure details (see MAR for exact dosages):    Block needle gauge:  25 G   Anesthetic injected:  Lidocaine 1% w/o epi   Steroid injected:  None   Additive injected:  None   Injection procedure:  Anatomic landmarks identified, incremental injection, negative aspiration for blood, anatomic landmarks palpated and introduced needle   Paresthesia:  None Post-procedure details:    Dressing:  None   Outcome:  Anesthesia achieved   Patient tolerance of procedure:  Tolerated well, no immediate complications   (including critical care  time)  Medications Ordered in ED Medications  morphine 4 MG/ML injection 4 mg (4 mg Intravenous Given 02/03/18 1620)  Tdap (BOOSTRIX) injection 0.5 mL (0.5 mLs Intramuscular Given 02/03/18 1621)  lidocaine (PF) (XYLOCAINE) 1 % injection 30 mL (30 mLs Infiltration Given 02/03/18 1815)  doxycycline (VIBRA-TABS) tablet 100 mg (100 mg Oral Given 02/03/18 1816)     Initial Impression / Assessment and Plan / ED Course  I have reviewed the triage vital signs and the nursing notes.  Pertinent labs & imaging results that were available during my care of the patient were reviewed by me and considered in my medical decision making (see chart for details).  Clinical Course as of Feb 03 1825  Fri Feb 03, 2018  1645 I spoke with Dr. Grandville Silos who will come to evaluate the patient, but advised a digital block and scrubbing of the wound.   [AL]    Clinical Course User Index [AL] Frederica Kuster, PA-C    Patient with distal right index finger tip amputation following dog bite.  Dog is updated on her rabies.  Patient updated on tetanus during ED course.  Patient is allergic to penicillin, so we will treat with doxycycline.  Patient evaluated by Dr. Grandville Silos in the ED who removed patient's remaining nail.  Digital block performed by myself prior and patient's wound was irrigated at the sink as well as scrubbed with iodine scrub.  Patient will follow-up with Dr. Grandville Silos next week in the office.  Pain control directed by Dr. Grandville Silos.  Return precautions discussed.  Patient understands and agrees with plan.  Patient vitals stable throughout ED course and discharged in satisfactory condition.  Final Clinical Impressions(s) / ED Diagnoses   Final diagnoses:  Dog bite, initial encounter  Traumatic amputation of fingertip, initial encounter    ED Discharge Orders         Ordered    doxycycline (VIBRA-TABS) 100 MG tablet  2 times daily     02/03/18 1746    acetaminophen (TYLENOL) 325 MG tablet  Every 6  hours     02/03/18 1746           Lyndel Dancel, Bea Graff, PA-C 02/03/18 1826    Charlesetta Shanks, MD 02/07/18 1230

## 2018-02-03 NOTE — ED Triage Notes (Addendum)
Pt was bitten by a dog on her right index finger this afternoon. Pt has pulsating wound to tip of index finger. Dog is up to date on rabies vaccination.

## 2018-02-10 ENCOUNTER — Telehealth: Payer: Self-pay

## 2018-02-10 NOTE — Telephone Encounter (Signed)
Breast center sent her a letter We don't normally call with results because they do it

## 2018-02-10 NOTE — Telephone Encounter (Signed)
Copied from [place] [id number]. Topic: General - Other >> Feb 09, 2018  4:42 PM Yvette Rack wrote: Reason for CRM: Pt requests mammogram results. Cb# [phone]

## 2018-02-10 NOTE — Telephone Encounter (Signed)
Patient notified and verbalized understanding. She will wait on result.

## 2018-02-11 ENCOUNTER — Other Ambulatory Visit: Payer: Self-pay | Admitting: Family Medicine

## 2018-02-11 DIAGNOSIS — G894 Chronic pain syndrome: Secondary | ICD-10-CM

## 2018-02-13 ENCOUNTER — Encounter: Payer: Self-pay | Admitting: Family Medicine

## 2018-02-14 ENCOUNTER — Other Ambulatory Visit: Payer: Self-pay | Admitting: Family Medicine

## 2018-02-14 DIAGNOSIS — G894 Chronic pain syndrome: Secondary | ICD-10-CM

## 2018-02-14 MED ORDER — PREGABALIN 75 MG PO CAPS: ORAL_CAPSULE | ORAL | 1 refills | Status: DC

## 2018-02-14 NOTE — Telephone Encounter (Signed)
Jasmine accidentally printed but can you send in if approved   Database ran on 11/29/17 and is in media for review  Last written: 07/22/17 Last ov: 01/26/18 Next ov: 04/28/18 Contract: 01/27/19 UDS: 07/29/18

## 2018-02-15 ENCOUNTER — Telehealth: Payer: Self-pay | Admitting: Acute Care

## 2018-02-15 DIAGNOSIS — Z122 Encounter for screening for malignant neoplasm of respiratory organs: Secondary | ICD-10-CM

## 2018-02-15 DIAGNOSIS — Z87891 Personal history of nicotine dependence: Secondary | ICD-10-CM

## 2018-02-17 NOTE — Telephone Encounter (Signed)
Spoke with pt and scheduled SDMV 03/03/18 3:00 CT ordered Nothing further needed

## 2018-02-20 ENCOUNTER — Other Ambulatory Visit: Payer: Self-pay | Admitting: Family Medicine

## 2018-02-20 DIAGNOSIS — G894 Chronic pain syndrome: Secondary | ICD-10-CM

## 2018-02-21 ENCOUNTER — Other Ambulatory Visit: Payer: Self-pay | Admitting: Family Medicine

## 2018-02-21 DIAGNOSIS — G894 Chronic pain syndrome: Secondary | ICD-10-CM

## 2018-02-21 NOTE — Telephone Encounter (Signed)
Database ran and is on your desk for review.  Last filled per database: 01/12/18 Last written: 11/29/17 Last ov:  01/26/18 Next ov: 04/28/18 Contract: 01/27/19 UDS: 07/29/18

## 2018-02-22 ENCOUNTER — Encounter: Payer: Self-pay | Admitting: Family Medicine

## 2018-02-22 DIAGNOSIS — H903 Sensorineural hearing loss, bilateral: Secondary | ICD-10-CM | POA: Insufficient documentation

## 2018-02-22 DIAGNOSIS — H60333 Swimmer's ear, bilateral: Secondary | ICD-10-CM | POA: Insufficient documentation

## 2018-02-22 DIAGNOSIS — H9313 Tinnitus, bilateral: Secondary | ICD-10-CM | POA: Insufficient documentation

## 2018-03-03 ENCOUNTER — Ambulatory Visit (INDEPENDENT_AMBULATORY_CARE_PROVIDER_SITE_OTHER): Payer: Medicare Other | Admitting: Acute Care

## 2018-03-03 ENCOUNTER — Encounter: Payer: Self-pay | Admitting: Acute Care

## 2018-03-03 ENCOUNTER — Ambulatory Visit (INDEPENDENT_AMBULATORY_CARE_PROVIDER_SITE_OTHER)
Admission: RE | Admit: 2018-03-03 | Discharge: 2018-03-03 | Disposition: A | Discharge status: Home / Self Care | Payer: Medicare Other | Source: Ambulatory Visit | Attending: Acute Care | Admitting: Acute Care

## 2018-03-03 DIAGNOSIS — Z87891 Personal history of nicotine dependence: Secondary | ICD-10-CM | POA: Diagnosis not present

## 2018-03-03 DIAGNOSIS — Z122 Encounter for screening for malignant neoplasm of respiratory organs: Secondary | ICD-10-CM

## 2018-03-03 NOTE — Progress Notes (Signed)
Shared Decision Making Visit Lung Cancer Screening Program [phone])   Eligibility:  Age 62 y.o.  Pack Years Smoking History Calculation 42 pack year smoking history (# packs/per year x # years smoked)  Recent History of coughing up blood  no  Unexplained weight loss? no ( >Than 15 pounds within the last 6 months )  Prior History Lung / other cancer no (Diagnosis within the last 5 years already requiring surveillance chest CT Scans).  Smoking Status Former Smoker  Former Smokers: Years since quit: 6 years  Quit Date: 12/29/2011  Visit Components:  Discussion included one or more decision making aids. yes  Discussion included risk/benefits of screening. yes  Discussion included potential follow up diagnostic testing for abnormal scans. yes  Discussion included meaning and risk of over diagnosis. yes  Discussion included meaning and risk of False Positives. yes  Discussion included meaning of total radiation exposure. yes  Counseling Included:  Importance of adherence to annual lung cancer LDCT screening. yes  Impact of comorbidities on ability to participate in the program. yes  Ability and willingness to under diagnostic treatment. yes  Smoking Cessation Counseling:  Current Smokers:   Discussed importance of smoking cessation. NA  Information about tobacco cessation classes and interventions provided to patient. yes  Patient provided with "ticket" for LDCT Scan. yes  Symptomatic Patient. no  Counseling  Diagnosis Code: Tobacco Use Z72.0  Asymptomatic Patient yes  Counseling (Intermediate counseling: > three minutes counseling) M3846  Former Smokers:   Discussed the importance of maintaining cigarette abstinence. yes  Diagnosis Code: Personal History of Nicotine Dependence. K59.935  Information about tobacco cessation classes and interventions provided to patient. Yes  Patient provided with "ticket" for LDCT Scan. yes  Written Order for Lung  Cancer Screening with LDCT placed in Epic. Yes (CT Chest Lung Cancer Screening Low Dose W/O CM) [id number] Z12.2-Screening of respiratory organs Z87.891-Personal history of nicotine dependence  I spent 25 minutes of face to face time with Veronica Solomon discussing the risks and benefits of lung cancer screening. We viewed a power point together that explained in detail the above noted topics. We took the time to pause the power point at intervals to allow for questions to be asked and answered to ensure understanding. We discussed that she had taken the single most powerful action possible to decrease sge risk of developing lung cancer when she quit smoking. I counseled her to remain smoke free, and to contact me if she ever had the desire to smoke again so that I can provide resources and tools to help support the effort to remain smoke free. We discussed the time and location of the scan, and that either  Doroteo Glassman RN or I will call with the results within  24-48 hours of receiving them. She has my card and contact information in the event she needs to speak with me, in addition to a copy of the power point we reviewed as a resource. She verbalized understanding of all of the above and had no further questions upon leaving the office.     I explained to the patient that there has been a high incidence of coronary artery disease noted on these exams. I explained that this is a non-gated exam therefore degree or severity cannot be determined. This patient is currently on statin therapy. I have asked the patient to follow-up with their PCP regarding any incidental finding of coronary artery disease and management with diet or medication as they feel is clinically  indicated. The patient verbalized understanding of the above and had no further questions.     Veronica Spatz, NP 03/03/2018 3:23 PM

## 2018-03-04 ENCOUNTER — Encounter: Payer: Self-pay | Admitting: Family Medicine

## 2018-03-07 ENCOUNTER — Encounter: Payer: Self-pay | Admitting: Family Medicine

## 2018-03-07 DIAGNOSIS — I2510 Atherosclerotic heart disease of native coronary artery without angina pectoris: Secondary | ICD-10-CM | POA: Insufficient documentation

## 2018-03-10 ENCOUNTER — Other Ambulatory Visit: Payer: Self-pay | Admitting: Acute Care

## 2018-03-10 DIAGNOSIS — Z87891 Personal history of nicotine dependence: Secondary | ICD-10-CM

## 2018-03-10 DIAGNOSIS — Z122 Encounter for screening for malignant neoplasm of respiratory organs: Secondary | ICD-10-CM

## 2018-03-21 ENCOUNTER — Other Ambulatory Visit: Payer: Self-pay | Admitting: Family Medicine

## 2018-04-05 ENCOUNTER — Other Ambulatory Visit: Payer: Self-pay | Admitting: Family Medicine

## 2018-04-05 DIAGNOSIS — G894 Chronic pain syndrome: Secondary | ICD-10-CM

## 2018-04-05 MED ORDER — HYDROCODONE-ACETAMINOPHEN 10-325 MG PO TABS: 1.0000 | ORAL_TABLET | Freq: Three times a day (TID) | ORAL | 0 refills | Status: DC | PRN

## 2018-04-05 MED ORDER — TRAMADOL HCL 50 MG PO TABS: 50.0000 mg | ORAL_TABLET | Freq: Three times a day (TID) | ORAL | 0 refills | Status: DC

## 2018-04-05 NOTE — Telephone Encounter (Signed)
Pt is requesting refill on tramadol. Pt also on hydrocodone-acetaminophen 10-325mg ?   Last OV: 01/26/2018 Last Fill: 02/21/2018 #90 and 0RF UDS: 01/26/2018 Low risk

## 2018-04-05 NOTE — Telephone Encounter (Signed)
Pt is requesting refill on hydrocodone. Pt also taking tramadol 50mg ?   Last OV: 01/26/2018 Last Fill: 01/20/2018 #30 and 0RF UDS: 01/26/2018 Low risk

## 2018-04-24 ENCOUNTER — Encounter: Payer: Self-pay | Admitting: Family Medicine

## 2018-04-24 ENCOUNTER — Ambulatory Visit: Payer: Medicare Other | Admitting: Family Medicine

## 2018-04-24 DIAGNOSIS — G894 Chronic pain syndrome: Secondary | ICD-10-CM

## 2018-04-24 MED ORDER — HYDROCODONE-ACETAMINOPHEN 10-325 MG PO TABS: 1.0000 | ORAL_TABLET | Freq: Three times a day (TID) | ORAL | 0 refills | Status: DC | PRN

## 2018-04-24 MED ORDER — TRAMADOL HCL 50 MG PO TABS: 50.0000 mg | ORAL_TABLET | Freq: Three times a day (TID) | ORAL | 0 refills | Status: DC

## 2018-04-24 NOTE — Patient Instructions (Signed)

## 2018-04-24 NOTE — Progress Notes (Signed)
Patient ID: Veronica Solomon Hence, female    DOB: [birth year]  Age: 62 y.o. MRN: [id number]    Subjective:  Subjective  HPI [facility] presents for f/u pain meds.  No complaints.  She fell last week because her eyes were dilated and she went walking    Review of Systems  Constitutional: Negative for appetite change, diaphoresis, fatigue and unexpected weight change.  Eyes: Negative for pain, redness and visual disturbance.  Respiratory: Negative for cough, chest tightness, shortness of breath and wheezing.   Cardiovascular: Negative for chest pain, palpitations and leg swelling.  Endocrine: Negative for cold intolerance, heat intolerance, polydipsia, polyphagia and polyuria.  Genitourinary: Negative for difficulty urinating, dysuria and frequency.  Neurological: Negative for dizziness, light-headedness, numbness and headaches.    History Past Medical History:  Diagnosis Date  . Adenomatous colon polyp   . Allergy   . C. difficile colitis   . Diverticulosis   . Hyperlipidemia   . Hypertension   . IBS (irritable bowel syndrome)   . Neuromuscular disorder [facility])     She has a past surgical history that includes Abdominal hysterectomy (2002).   Her family history includes Breast cancer in her sister; Depression in her mother; Diabetes in her maternal grandmother; Heart attack in her mother; Hypertension in her mother and paternal grandmother; Uterine cancer in her mother.She reports that she quit smoking about 6 years ago. Her smoking use included cigarettes. She has a 42.00 pack-year smoking history. She has quit using smokeless tobacco. She reports that she drinks alcohol. She reports that she does not use drugs.  Current Outpatient Medications on File Prior to Visit  Medication Sig Dispense Refill  . amLODipine (NORVASC) 5 MG tablet Take 1 tablet (5 mg total) by mouth daily. 90 tablet 1  . atorvastatin (LIPITOR) 20 MG tablet Take 1 tablet (20 mg total) by mouth daily. 30  tablet 2  . celecoxib (CELEBREX) 200 MG capsule TAKE 1 CAPSULE BY MOUTH 1 TO 2 TIMES DAILY AS NEEDED 180 capsule 0  . DULoxetine (CYMBALTA) 60 MG capsule Take 2 capsules (120 mg total) by mouth daily. 180 capsule 1  . pregabalin (LYRICA) 75 MG capsule TAKE 1 CAPSULE(75 MG) BY MOUTH TWICE DAILY 180 capsule 1   No current facility-administered medications on file prior to visit.      Objective:  Objective  Physical Exam  Constitutional: She is oriented to person, place, and time. She appears well-developed and well-nourished.  HENT:  Head: Normocephalic and atraumatic.  Eyes: Conjunctivae and EOM are normal.  Neck: Normal range of motion. Neck supple. No JVD present. Carotid bruit is not present. No thyromegaly present.  Cardiovascular: Normal rate, regular rhythm and normal heart sounds.  No murmur heard. Pulmonary/Chest: Effort normal and breath sounds normal. No respiratory distress. She has no wheezes. She has no rales. She exhibits no tenderness.  Musculoskeletal: She exhibits tenderness. She exhibits no edema.  Neurological: She is alert and oriented to person, place, and time.  Psychiatric: She has a normal mood and affect.  Nursing note and vitals reviewed.  BP 136/77   Pulse 79   Temp 98.6 F (37 C) (Oral)   Resp 16   Ht 5\' 9"  (1.753 m)   Wt 231 lb 6.4 oz (105 kg)   SpO2 98%   BMI 34.17 kg/m  Wt Readings from Last 3 Encounters:  04/24/18 231 lb 6.4 oz (105 kg)  01/26/18 225 lb 12.8 oz (102.4 kg)  09/05/17 236 lb (107 kg)  Lab Results  Component Value Date   WBC 8.9 07/03/2015   HGB 13.8 07/03/2015   HCT 42.2 07/03/2015   PLT 262.0 07/03/2015   GLUCOSE 101 (H) 01/26/2018   CHOL 211 (H) 01/26/2018   TRIG 174.0 (H) 01/26/2018   HDL 58.70 01/26/2018   LDLDIRECT 195.7 01/12/2010   LDLCALC 117 (H) 01/26/2018   ALT 18 01/26/2018   AST 19 01/26/2018   NA 140 01/26/2018   K 5.2 (H) 01/26/2018   CL 101 01/26/2018   CREATININE 0.87 01/26/2018   BUN 19  01/26/2018   CO2 34 (H) 01/26/2018   TSH 1.13 01/26/2018   HGBA1C 5.8 12/30/2014   MICROALBUR 4.1 (H) 11/24/2012    Ct Chest Lung Ca Screen Low Dose W/o Cm  Result Date: 03/06/2018 CLINICAL DATA:  Ex-smoker, quitting 6 years ago. Forty-two pack-year history. Asymptomatic. EXAM: CT CHEST WITHOUT CONTRAST LOW-DOSE FOR LUNG CANCER SCREENING TECHNIQUE: Multidetector CT imaging of the chest was performed following the standard protocol without IV contrast. COMPARISON:  Chest radiograph 07/27/2007.  No prior CT. FINDINGS: Cardiovascular: Aortic and branch vessel atherosclerosis. Tortuous thoracic aorta. Normal heart size, without pericardial effusion. Lad coronary artery atherosclerosis. Mediastinum/Nodes: No mediastinal or definite hilar adenopathy, given limitations of unenhanced CT. Lungs/Pleura: No pleural fluid. Mild centrilobular emphysema. Solid and non solid pulmonary nodules. A non solid nodule measures volume derived equivalent diameter 8.7 mm in the right lower lobe. The largest solid nodule is in the left lower lobe and measures volume derived equivalent diameter 4.2 mm. Upper Abdomen: Normal imaged portions of the liver, spleen, stomach, pancreas, gallbladder, adrenal glands, kidneys. Musculoskeletal: No acute osseous abnormality. IMPRESSION: 1. Lung-RADS 2, benign appearance or behavior. Continue annual screening with low-dose chest CT without contrast in 12 months. 2. Aortic atherosclerosis (ICD10-I70.0) and emphysema (ICD10-J43.9). 3. Age advanced coronary artery atherosclerosis. Recommend assessment of coronary risk factors and consideration of medical therapy. Electronically Signed   By: Abigail Miyamoto M.D.   On: 03/06/2018 08:51     Assessment & Plan:  Plan  I have discontinued [name]. [place]'s doxycycline and acetaminophen. I am also having her maintain her celecoxib, atorvastatin, pregabalin, amLODipine, DULoxetine, traMADol, and HYDROcodone-acetaminophen.  Meds ordered this  encounter  Medications  . traMADol (ULTRAM) 50 MG tablet    Sig: Take 1 tablet (50 mg total) by mouth every 8 (eight) hours.    Dispense:  90 tablet    Refill:  0  . HYDROcodone-acetaminophen (NORCO) 10-325 MG tablet    Sig: Take 1 tablet by mouth every 8 (eight) hours as needed.    Dispense:  30 tablet    Refill:  0    Problem List Items Addressed This Visit      Unprioritized   Chronic pain syndrome    Back pain-- uses tramadol for mild pain and headaches and hydrocodone for severe pain      Relevant Medications   traMADol (ULTRAM) 50 MG tablet   HYDROcodone-acetaminophen (NORCO) 10-325 MG tablet      Follow-up: Return in about 3 months (around 07/25/2018), or if symptoms worsen or fail to improve.  [name], DO

## 2018-04-26 NOTE — Assessment & Plan Note (Signed)
Back pain-- uses tramadol for mild pain and headaches and hydrocodone for severe pain

## 2018-04-28 ENCOUNTER — Ambulatory Visit: Payer: Medicare Other | Admitting: Family Medicine

## 2018-05-14 ENCOUNTER — Other Ambulatory Visit: Payer: Self-pay | Admitting: Family Medicine

## 2018-07-07 ENCOUNTER — Other Ambulatory Visit: Payer: Self-pay | Admitting: Family Medicine

## 2018-07-07 DIAGNOSIS — G894 Chronic pain syndrome: Secondary | ICD-10-CM

## 2018-07-10 NOTE — Telephone Encounter (Signed)
Database ran on 04/24/18 and is in media for review  Last filled per database: 04/24/18 Last written: 04/24/18 Last ov: 04/24/18 Next ov: 07/24/18 Contract: 01/27/19 UDS: 07/29/18

## 2018-07-24 ENCOUNTER — Ambulatory Visit: Payer: Medicare Other | Admitting: Family Medicine

## 2018-07-25 ENCOUNTER — Ambulatory Visit: Payer: Medicare Other | Admitting: Family Medicine

## 2018-07-25 ENCOUNTER — Encounter: Payer: Self-pay | Admitting: Family Medicine

## 2018-07-25 VITALS — BP 136/78 | HR 72 | Temp 98.3°F | Resp 16 | Ht 69.0 in | Wt 237.4 lb

## 2018-07-25 DIAGNOSIS — Z79899 Other long term (current) drug therapy: Secondary | ICD-10-CM | POA: Diagnosis not present

## 2018-07-25 DIAGNOSIS — I10 Essential (primary) hypertension: Secondary | ICD-10-CM

## 2018-07-25 DIAGNOSIS — G894 Chronic pain syndrome: Secondary | ICD-10-CM | POA: Diagnosis not present

## 2018-07-25 DIAGNOSIS — E785 Hyperlipidemia, unspecified: Secondary | ICD-10-CM

## 2018-07-25 MED ORDER — HYDROCODONE-ACETAMINOPHEN 10-325 MG PO TABS: 1.0000 | ORAL_TABLET | Freq: Three times a day (TID) | ORAL | 0 refills | Status: DC | PRN

## 2018-07-25 NOTE — Assessment & Plan Note (Signed)
Stable con't meds  uds / contract uds

## 2018-07-25 NOTE — Assessment & Plan Note (Signed)
Tolerating statin, encouraged heart healthy diet, avoid trans fats, minimize simple carbs and saturated fats. Increase exercise as tolerated 

## 2018-07-25 NOTE — Patient Instructions (Signed)
Opioid Pain Medicine Information    Opioids are powerful medicines that are used to treat moderate to severe pain. Opioids should be taken with the supervision of a trained health care provider. They should be taken for the shortest period of time as possible. This is because opioids can be addictive and the longer you take opioids, the greater your risk of addiction (opioid use disorder).  What do opioids do?  Opioids help to reduce or eliminate pain. When used for short periods of time, they can help you:  · Sleep better.  · Do better in physical or occupational therapy.  · Feel better in the first few days after an injury.  · Recover from surgery.  What is a pain treatment plan?  A pain treatment plan is an agreement between you and your health care provider. Pain is unique to each person, and treatments vary depending on your condition. To manage your pain successfully, you and your health care provider need to understand each other and work together. To help you do this:  · Discuss the goals of your treatment, including how much pain you might expect to have and how you will manage the pain.  · Review the risks and benefits of taking opioid medicines for your condition.  · Remember that a good treatment plan uses more than one approach and minimizes the chance of side effects.  · Be honest about the amount of medicines you take, and about any drug or alcohol use.  · Get pain medicine prescriptions from only one care provider.  · Keep all follow-up visits as told by your health care provider. This is important.  What instructions should I follow while taking opioid pain medicine?         While you are taking opioid pain medicines, follow these instructions:  · Do not drive.  · Do not use machinery or power tools.  · Do not sign legal documents.  · Do not drink alcohol.  · Do not take sleeping pills.  · Do not supervise children by yourself.  · Do not participate in activities that require climbing or being in  high places.  · Do not enter a body of water--such as a lake, river, ocean, spa, or swimming pool--unless an adult is nearby who can monitor and help you.  What kinds of side effects can opioids cause?  Opioids can cause side effects, such as:  · Constipation.  · Nausea.  · Vomiting.  · Drowsiness.  · Confusion.  · Opioid use disorder.  · Breathing difficulties (respiratory depression).  Using opioid pain medicines for longer than 3 days increases your risk of these side effects.  Taking opioid pain medicine for a long period of time can affect your ability to do daily tasks. It also puts you at risk for:  · Motor vehicle accidents.  · Depression.  · Suicide.  · Heart attack.  · Overdose, which can sometimes lead to death.  What are alternative ways to manage pain?  Pain can be managed with many types of alternative treatments. Ask your health care provider to refer you to one or more specialists who can help you manage pain through:  · Physical or occupational therapy.  · Counseling (cognitive behavioral therapy).  · Good nutrition.  · Biofeedback.  · Massage.  · Meditation.  · Non-opioid medicine.  · Following a gentle exercise program.  How can I keep others safe while I am taking opioid pain medicine?  · Keep   pain medicine in a locked cabinet, or in a secure area where children cannot reach it.  · Never share your pain medicine with anyone.  · Do not save any leftover pills. If you have leftover medicine, you can:  ? Bring the medicine to a prescription take-back program. This is usually offered by the county or law enforcement.  ? Bring it to a pharmacy that has a drug disposal container.  ? Throw it out in the trash. Check the label or package insert of your medicine to see whether this is safe to do. If it is safe to throw it out, remove the medicine from the container and mix it with food or pet waste before putting it in the trash.  How do I stop taking opioids if I have been taking them for a long  time?  If you have been taking opioid medicine for more than a few weeks, you may need to slowly decrease (taper) how much you take until you stop completely. Tapering your use of opioids can decrease your chances of experiencing withdrawal symptoms, such as:  · Pain and cramping in the abdomen.  · Nausea.  · Sweating.  · Sleepiness.  · Restlessness.  · Uncontrollable shaking (tremors).  · Cravings for the medicine.  Do not attempt to taper your use of opioids on your own. Talk with your health care provider about how to do this. Your health care provider may prescribe a step-down schedule based on how much medicine you are taking and how long you have been taking it.  Where to find support  If you have been taking opioids for a long time, you may benefit from receiving support for quitting from a local support group or counselor. Ask your health care provider for a referral to these resources in your area.  Where to find more information  · Centers for Disease Control and Prevention (CDC): www.cdc.gov/drugoverdose/opioids/index.html  Get help right away if:  Seek medical care right away if you are taking opioids and you (or people close to you) notice any of the following:  · Difficulty breathing.  · Breathing that is slower or more shallow than normal.  · A very slow heartbeat (pulse).  · Severe confusion.  · Unconsciousness.  · Sleepiness.  · Slurred speech.  · Nausea and vomiting.  · Cold, clammy skin.  · Blue lips or fingernails.  · Limpness.  · Abnormally small pupils.  If you think that you or someone else may have taken too much of an opioid medicine, get medical help right away. Do not wait to see if the symptoms go away on their own.   If you ever feel like you may hurt yourself or others, or have thoughts about taking your own life, get help right away. You can go to your nearest emergency department or call:  · Your local emergency services (911 in the [place]  · The hotline of the National Poison Control  Center (1-800-222-1222 in the [place]  · A suicide crisis helpline, such as the National Suicide Prevention Lifeline at 1-800-273-8255. This is open 24 hours a day.  Summary  · Opioid medicines can help you manage moderate to severe pain for a short period of time.  · Discuss the goals of your treatment with your health care provider, including how much pain you might expect to have and how you will manage the pain.  · A good treatment plan uses more than one approach. Pain can be managed

## 2018-07-25 NOTE — Assessment & Plan Note (Signed)
Well controlled, no changes to meds. Encouraged heart healthy diet such as the DASH diet and exercise as tolerated.  °

## 2018-07-25 NOTE — Progress Notes (Signed)
Patient ID: Veronica Solomon Hence, female    DOB: 1955-09-18  Age: 63 y.o. MRN: [id number]    Subjective:  Subjective  HPI [facility] [place] presents for f/u pain management --- doing well with current meds.  No complaints.    Review of Systems  Constitutional: Negative for chills and fever.  HENT: Negative for congestion and hearing loss.   Eyes: Negative for discharge.  Respiratory: Negative for cough and shortness of breath.   Cardiovascular: Negative for chest pain, palpitations and leg swelling.  Gastrointestinal: Negative for abdominal pain, blood in stool, constipation, diarrhea, nausea and vomiting.  Genitourinary: Negative for dysuria, frequency, hematuria and urgency.  Musculoskeletal: Negative for back pain and myalgias.  Skin: Negative for rash.  Allergic/Immunologic: Negative for environmental allergies.  Neurological: Negative for dizziness, weakness and headaches.  Hematological: Does not bruise/bleed easily.  Psychiatric/Behavioral: Negative for suicidal ideas. The patient is not nervous/anxious.     History Past Medical History:  Diagnosis Date  . Adenomatous colon polyp   . Allergy   . C. difficile colitis   . Diverticulosis   . Hyperlipidemia   . Hypertension   . IBS (irritable bowel syndrome)   . Neuromuscular disorder [facility])     She has a past surgical history that includes Abdominal hysterectomy (2002).   Her family history includes Breast cancer in her sister; Depression in her mother; Diabetes in her maternal grandmother; Heart attack in her mother; Hypertension in her mother and paternal grandmother; Uterine cancer in her mother.She reports that she quit smoking about 6 years ago. Her smoking use included cigarettes. She has a 42.00 pack-year smoking history. She has quit using smokeless tobacco. She reports current alcohol use. She reports that she does not use drugs.  Current Outpatient Medications on File Prior to Visit  Medication Sig Dispense  Refill  . amLODipine (NORVASC) 5 MG tablet Take 1 tablet (5 mg total) by mouth daily. 90 tablet 1  . atorvastatin (LIPITOR) 20 MG tablet TAKE 1 TABLET BY MOUTH DAILY 90 tablet 1  . celecoxib (CELEBREX) 200 MG capsule TAKE 1 CAPSULE BY MOUTH 1 TO 2 TIMES DAILY AS NEEDED 180 capsule 0  . DULoxetine (CYMBALTA) 60 MG capsule Take 2 capsules (120 mg total) by mouth daily. 180 capsule 1  . pregabalin (LYRICA) 75 MG capsule TAKE 1 CAPSULE(75 MG) BY MOUTH TWICE DAILY 180 capsule 1  . traMADol (ULTRAM) 50 MG tablet TAKE 1 TABLET(50 MG) BY MOUTH EVERY 8 HOURS 90 tablet 0   No current facility-administered medications on file prior to visit.      Objective:  Objective  Physical Exam Vitals signs and nursing note reviewed.  Constitutional:      General: She is not in acute distress.    Appearance: She is well-developed.  HENT:     Right Ear: External ear normal.     Left Ear: External ear normal.     Nose: Nose normal.  Eyes:     Pupils: Pupils are equal, round, and reactive to light.  Neck:     Musculoskeletal: Normal range of motion and neck supple.  Cardiovascular:     Rate and Rhythm: Normal rate and regular rhythm.     Heart sounds: Normal heart sounds. No murmur.  Pulmonary:     Effort: Pulmonary effort is normal. No respiratory distress.     Breath sounds: Normal breath sounds. No wheezing or rales.  Chest:     Chest wall: No tenderness.  Neurological:  Mental Status: She is alert and oriented to person, place, and time.  Psychiatric:        Behavior: Behavior normal.        Thought Content: Thought content normal.        Judgment: Judgment normal.    BP 136/78 (BP Location: Left Arm, Cuff Size: Large)   Pulse 72   Temp 98.3 F (36.8 C) (Oral)   Resp 16   Ht 5\' 9"  (1.753 m)   Wt 237 lb 6.4 oz (107.7 kg)   SpO2 96%   BMI 35.06 kg/m  Wt Readings from Last 3 Encounters:  07/25/18 237 lb 6.4 oz (107.7 kg)  04/24/18 231 lb 6.4 oz (105 kg)  01/26/18 225 lb 12.8 oz  (102.4 kg)     Lab Results  Component Value Date   WBC 8.9 07/03/2015   HGB 13.8 07/03/2015   HCT 42.2 07/03/2015   PLT 262.0 07/03/2015   GLUCOSE 101 (H) 01/26/2018   CHOL 211 (H) 01/26/2018   TRIG 174.0 (H) 01/26/2018   HDL 58.70 01/26/2018   LDLDIRECT 195.7 01/12/2010   LDLCALC 117 (H) 01/26/2018   ALT 18 01/26/2018   AST 19 01/26/2018   NA 140 01/26/2018   K 5.2 (H) 01/26/2018   CL 101 01/26/2018   CREATININE 0.87 01/26/2018   BUN 19 01/26/2018   CO2 34 (H) 01/26/2018   TSH 1.13 01/26/2018   HGBA1C 5.8 12/30/2014   MICROALBUR 4.1 (H) 11/24/2012    Ct Chest Lung Ca Screen Low Dose W/o Cm  Result Date: 03/06/2018 CLINICAL DATA:  Ex-smoker, quitting 6 years ago. Forty-two pack-year history. Asymptomatic. EXAM: CT CHEST WITHOUT CONTRAST LOW-DOSE FOR LUNG CANCER SCREENING TECHNIQUE: Multidetector CT imaging of the chest was performed following the standard protocol without IV contrast. COMPARISON:  Chest radiograph 07/27/2007.  No prior CT. FINDINGS: Cardiovascular: Aortic and branch vessel atherosclerosis. Tortuous thoracic aorta. Normal heart size, without pericardial effusion. Lad coronary artery atherosclerosis. Mediastinum/Nodes: No mediastinal or definite hilar adenopathy, given limitations of unenhanced CT. Lungs/Pleura: No pleural fluid. Mild centrilobular emphysema. Solid and non solid pulmonary nodules. A non solid nodule measures volume derived equivalent diameter 8.7 mm in the right lower lobe. The largest solid nodule is in the left lower lobe and measures volume derived equivalent diameter 4.2 mm. Upper Abdomen: Normal imaged portions of the liver, spleen, stomach, pancreas, gallbladder, adrenal glands, kidneys. Musculoskeletal: No acute osseous abnormality. IMPRESSION: 1. Lung-RADS 2, benign appearance or behavior. Continue annual screening with low-dose chest CT without contrast in 12 months. 2. Aortic atherosclerosis (ICD10-I70.0) and emphysema (ICD10-J43.9). 3. Age  advanced coronary artery atherosclerosis. Recommend assessment of coronary risk factors and consideration of medical therapy. Electronically Signed   By: Abigail Miyamoto M.D.   On: 03/06/2018 08:51     Assessment & Plan:  Plan  I am having [name]. [place] maintain her celecoxib, pregabalin, amLODipine, DULoxetine, atorvastatin, traMADol, and HYDROcodone-acetaminophen.  Meds ordered this encounter  Medications  . HYDROcodone-acetaminophen (NORCO) 10-325 MG tablet    Sig: Take 1 tablet by mouth every 8 (eight) hours as needed.    Dispense:  30 tablet    Refill:  0    Problem List Items Addressed This Visit      Unprioritized   Chronic pain syndrome    Stable con't meds  uds / contract uds       Relevant Medications   HYDROcodone-acetaminophen (NORCO) 10-325 MG tablet   Other Relevant Orders   Pain Mgmt, Profile 8 w/Conf,  U   Essential hypertension - Primary    Well controlled, no changes to meds. Encouraged heart healthy diet such as the DASH diet and exercise as tolerated.        Relevant Orders   Lipid panel   Comprehensive metabolic panel   Hyperlipidemia    Tolerating statin, encouraged heart healthy diet, avoid trans fats, minimize simple carbs and saturated fats. Increase exercise as tolerated      Relevant Orders   Lipid panel   Comprehensive metabolic panel    Other Visit Diagnoses    High risk medication use       Relevant Orders   Pain Mgmt, Profile 8 w/Conf, U      Follow-up: Return in about 3 months (around 10/23/2018), or if symptoms worsen or fail to improve, for pain managment.  [name], DO

## 2018-07-26 LAB — COMPREHENSIVE METABOLIC PANEL
ALT: 19 U/L (ref 0–35)
AST: 18 U/L (ref 0–37)
Albumin: 4.7 g/dL (ref 3.5–5.2)
Alkaline Phosphatase: 105 U/L (ref 39–117)
BUN: 17 mg/dL (ref 6–23)
CALCIUM: 10.3 mg/dL (ref 8.4–10.5)
CHLORIDE: 102 meq/L (ref 96–112)
CO2: 29 mEq/L (ref 19–32)
CREATININE: 0.82 mg/dL (ref 0.40–1.20)
GFR: 70.67 mL/min (ref >60.00)
Glucose, Bld: 70 mg/dL (ref 70–99)
Potassium: 4.9 mEq/L (ref 3.5–5.1)
Sodium: 140 mEq/L (ref 135–145)
Total Bilirubin: 0.5 mg/dL (ref 0.2–1.2)
Total Protein: 7.2 g/dL (ref 6.0–8.3)

## 2018-07-26 LAB — LIPID PANEL
CHOL/HDL RATIO: 3
CHOLESTEROL: 164 mg/dL (ref 0–200)
HDL: 55.1 mg/dL (ref >39.00)
LDL CALC: 79 mg/dL (ref 0–99)
NonHDL: 108.67
TRIGLYCERIDES: 149 mg/dL (ref 0.0–149.0)
VLDL: 29.8 mg/dL (ref 0.0–40.0)

## 2018-07-28 ENCOUNTER — Encounter: Payer: Self-pay | Admitting: *Deleted

## 2018-07-30 LAB — PAIN MGMT, PROFILE 8 W/CONF, U
6 Acetylmorphine: NEGATIVE ng/mL (ref <10)
Alcohol Metabolites: NEGATIVE ng/mL (ref <500)
Amphetamines: NEGATIVE ng/mL (ref <500)
Benzodiazepines: NEGATIVE ng/mL (ref <100)
Buprenorphine, Urine: NEGATIVE ng/mL (ref <5)
CODEINE: NEGATIVE ng/mL (ref <50)
Cocaine Metabolite: NEGATIVE ng/mL (ref <150)
Creatinine: 19.7 mg/dL — ABNORMAL LOW
HYDROMORPHONE: NEGATIVE ng/mL (ref <50)
Hydrocodone: NEGATIVE ng/mL (ref <50)
MDMA: NEGATIVE ng/mL (ref <500)
Marijuana Metabolite: NEGATIVE ng/mL (ref <20)
Morphine: NEGATIVE ng/mL (ref <50)
Norhydrocodone: NEGATIVE ng/mL (ref <50)
OXIDANT: NEGATIVE ug/mL (ref <200)
OXYCODONE: NEGATIVE ng/mL (ref <100)
Opiates: NEGATIVE ng/mL (ref <100)
Specific Gravity: 1.004 (ref >=1.0)
pH: 6.62 (ref 4.5–9.0)

## 2018-08-16 ENCOUNTER — Telehealth: Payer: Self-pay | Admitting: *Deleted

## 2018-08-16 ENCOUNTER — Other Ambulatory Visit: Payer: Self-pay | Admitting: Family Medicine

## 2018-08-16 DIAGNOSIS — G894 Chronic pain syndrome: Secondary | ICD-10-CM

## 2018-08-16 MED ORDER — PREGABALIN 75 MG PO CAPS: ORAL_CAPSULE | ORAL | 1 refills | Status: DC

## 2018-08-16 NOTE — Telephone Encounter (Signed)
Last written:  Last ov:07/25/18 Next ov: 10/23/18 Contract: 01/27/19 UDS: 01/23/19

## 2018-08-16 NOTE — Telephone Encounter (Signed)
Need refill for lyrica.

## 2018-08-16 NOTE — Telephone Encounter (Signed)
sent 

## 2018-08-22 ENCOUNTER — Telehealth: Payer: Self-pay | Admitting: *Deleted

## 2018-08-22 ENCOUNTER — Other Ambulatory Visit: Payer: Self-pay | Admitting: Family Medicine

## 2018-08-22 DIAGNOSIS — G894 Chronic pain syndrome: Secondary | ICD-10-CM

## 2018-08-22 MED ORDER — TRAMADOL HCL 50 MG PO TABS: ORAL_TABLET | ORAL | 0 refills | Status: DC

## 2018-08-22 NOTE — Telephone Encounter (Signed)
[place] requesting refill for tramadol  Last written: 07/10/18 Last ov: 07/25/18 Next ov: 10/23/18 Contract: 01/27/19 UDS: 01/23/19

## 2018-09-03 ENCOUNTER — Telehealth: Payer: Medicare Other | Admitting: Physician Assistant

## 2018-09-03 ENCOUNTER — Other Ambulatory Visit: Payer: Self-pay | Admitting: Physician Assistant

## 2018-09-03 DIAGNOSIS — J040 Acute laryngitis: Secondary | ICD-10-CM | POA: Diagnosis not present

## 2018-09-03 DIAGNOSIS — J309 Allergic rhinitis, unspecified: Secondary | ICD-10-CM

## 2018-09-03 MED ORDER — CETIRIZINE HCL 10 MG PO TABS: 10.0000 mg | ORAL_TABLET | Freq: Every day | ORAL | 0 refills | Status: DC

## 2018-09-03 MED ORDER — AZITHROMYCIN 250 MG PO TABS: ORAL_TABLET | ORAL | 0 refills | Status: DC

## 2018-09-03 MED ORDER — FLUTICASONE PROPIONATE 50 MCG/ACT NA SUSP: 2.0000 | Freq: Every day | NASAL | 0 refills | Status: DC

## 2018-09-03 MED ORDER — NAPHAZOLINE-PHENIRAMINE 0.025-0.3 % OP SOLN: 2.0000 [drp] | Freq: Two times a day (BID) | OPHTHALMIC | 0 refills | Status: DC | PRN

## 2018-09-03 NOTE — Progress Notes (Signed)
E visit for Allergic Rhinitis We are sorry that you are not feeling well.  Here is how we plan to help!  Based on what you have shared with me it looks like you have Allergic Rhinitis and laryngitis.   Rhinitis is when a reaction occurs that causes nasal congestion, runny nose, sneezing, and itching.  Most types of rhinitis are caused by an inflammation and are associated with symptoms in the eyes ears or throat. There are several types of rhinitis.  The most common are acute rhinitis, which is usually caused by a viral illness, allergic or seasonal rhinitis, and nonallergic or year-round rhinitis.  Nasal allergies occur certain times of the year.  Allergic rhinitis is caused when allergens in the air trigger the release of histamine in the body.  Histamine causes itching, swelling, and fluid to build up in the fragile linings of the nasal passages, sinuses and eyelids.  An itchy nose and clear discharge are common.  I recommend the following over the counter treatments: Cetirzine 10 mg once daily  I also would recommend a nasal spray: Flonase 2 sprays into each nostril once daily  You may also benefit from eye drops such as: Visine 1-2 drops each eye twice daily as needed    Due to persistent sore throat, now worsening, with pain with swallowing and changes in voice, you likely have laryngitis, which is inflammation/infection of your vocal cords. This can be viral, bacterial, or due to inflammation. Due to the symptoms worsening, I have prescribed antibiotics. The antibiotic is Azithromycin 250 mg. Take 2 pills today, and 1 pills days 2-5     HOME CARE:   You can use an over-the-counter saline nasal spray as needed  Avoid areas where there is heavy dust, mites, or molds  Stay indoors on windy days during the pollen season  Keep windows closed in home, at least in bedroom; use air conditioner.  Use high-efficiency house air filter  Keep windows closed in car, turn AC on  re-circulate  Avoid playing out with dog during pollen season  GET HELP RIGHT AWAY IF:   If your symptoms do not improve within 10 days  You become short of breath  You develop yellow or green discharge from your nose for over 3 days  You have coughing fits  MAKE SURE YOU:   Understand these instructions  Will watch your condition  Will get help right away if you are not doing well or get worse  Thank you for choosing an e-visit. Your e-visit answers were reviewed by a board certified advanced clinical practitioner to complete your personal care plan. Depending upon the condition, your plan could have included both over the counter or prescription medications. Please review your pharmacy choice. Be sure that the pharmacy you have chosen is open so that you can pick up your prescription now.  If there is a problem you may message your provider in [place] to have the prescription routed to another pharmacy. Your safety is important to [place]. If you have drug allergies check your prescription carefully.  For the next 24 hours, you can use MyChart to ask questions about today's visit, request a non-urgent call back, or ask for a work or school excuse from your e-visit provider. You will get an email in the next two days asking about your experience. I hope that your e-visit has been valuable and will speed your recovery.        I have spent 7 min in completion  and review of this note- Lacy Duverney [facility]

## 2018-09-14 ENCOUNTER — Other Ambulatory Visit: Payer: Self-pay | Admitting: Family Medicine

## 2018-10-06 ENCOUNTER — Other Ambulatory Visit: Payer: Self-pay | Admitting: Family Medicine

## 2018-10-06 DIAGNOSIS — G894 Chronic pain syndrome: Secondary | ICD-10-CM

## 2018-10-06 NOTE — Telephone Encounter (Signed)
Requesting:Tramadol Contract:01/26/18 UDS:01/26/18 Last Visit:07/25/18 Next Visit:10/23/18 Last Refill:08/22/18 #90  Please Advise

## 2018-10-23 ENCOUNTER — Other Ambulatory Visit: Payer: Self-pay | Admitting: Family Medicine

## 2018-10-23 ENCOUNTER — Ambulatory Visit (INDEPENDENT_AMBULATORY_CARE_PROVIDER_SITE_OTHER): Payer: Medicare Other | Admitting: Family Medicine

## 2018-10-23 ENCOUNTER — Encounter: Payer: Self-pay | Admitting: Family Medicine

## 2018-10-23 ENCOUNTER — Other Ambulatory Visit: Payer: Self-pay

## 2018-10-23 DIAGNOSIS — I10 Essential (primary) hypertension: Secondary | ICD-10-CM

## 2018-10-23 DIAGNOSIS — E785 Hyperlipidemia, unspecified: Secondary | ICD-10-CM | POA: Diagnosis not present

## 2018-10-23 DIAGNOSIS — M21611 Bunion of right foot: Secondary | ICD-10-CM

## 2018-10-23 NOTE — Progress Notes (Signed)
Virtual Visit via Video Note  I connected with [facility] on 10/23/18 at  1:00 PM EDT by a video enabled telemedicine application and verified that I am speaking with the correct person using two identifiers.  Location: Patient: home Provider: home   I discussed the limitations of evaluation and management by telemedicine and the availability of in person appointments. The patient expressed understanding and agreed to proceed.  History of Present Illness: Pt is home c/o bunion on her R foot.  It is starting to cause pain.  She also needs f/u for bp and cholesterol.   She has questions about shingrix as well.    No other complaints.   Past Medical History:  Diagnosis Date  . Adenomatous colon polyp   . Allergy   . C. difficile colitis   . Diverticulosis   . Hyperlipidemia   . Hypertension   . IBS (irritable bowel syndrome)   . Neuromuscular disorder [facility])    Current Outpatient Medications on File Prior to Visit  Medication Sig Dispense Refill  . amLODipine (NORVASC) 5 MG tablet TAKE 1 TABLET(5 MG) BY MOUTH DAILY 90 tablet 1  . atorvastatin (LIPITOR) 20 MG tablet TAKE 1 TABLET BY MOUTH DAILY 90 tablet 1  . azithromycin (ZITHROMAX) 250 MG tablet Take two pills today. Take one pill days 2-5 6 tablet 0  . celecoxib (CELEBREX) 200 MG capsule TAKE 1 CAPSULE BY MOUTH 1 TO 2 TIMES DAILY AS NEEDED 180 capsule 0  . cetirizine (ZYRTEC) 10 MG tablet Take 1 tablet (10 mg total) by mouth daily. 30 tablet 0  . DULoxetine (CYMBALTA) 60 MG capsule TAKE 2 CAPSULES BY MOUTH EVERY DAY 180 capsule 1  . fluticasone (FLONASE) 50 MCG/ACT nasal spray Place 2 sprays into both nostrils daily. 16 g 0  . HYDROcodone-acetaminophen (NORCO) 10-325 MG tablet Take 1 tablet by mouth every 8 (eight) hours as needed. 30 tablet 0  . naphazoline-pheniramine (NAPHCON-A) 0.025-0.3 % ophthalmic solution Place 2 drops into both eyes 2 (two) times daily as needed for eye irritation or allergies. 15 mL 0  .  pregabalin (LYRICA) 75 MG capsule TAKE 1 CAPSULE(75 MG) BY MOUTH TWICE DAILY 180 capsule 1  . traMADol (ULTRAM) 50 MG tablet TAKE 1 TABLET(50 MG) BY MOUTH EVERY 8 HOURS 90 tablet 0   No current facility-administered medications on file prior to visit.      Observations/Objective: Temp 97.8  Ht 5 ft 8.5 in [ Pt is in NAD  r foot-- + bunion   Assessment and Plan: 1. Essential hypertension Well controlled, no changes to meds. Encouraged heart healthy diet such as the DASH diet and exercise as tolerated. -- pt was not able to check it today  - Lipid panel; Future - Comprehensive metabolic panel; Future  2. Hyperlipidemia, unspecified hyperlipidemia type Encouraged heart healthy diet, increase exercise, avoid trans fats, consider a krill oil cap daily - Lipid panel; Future - Comprehensive metabolic panel; Future  3. Bunion of right foot Refer to podiatry - Ambulatory referral to Podiatry  4.  Need for shingles vaccine-- pt will get this at the pharmacy  Follow Up Instructions:    I discussed the assessment and treatment plan with the patient. The patient was provided an opportunity to ask questions and all were answered. The patient agreed with the plan and demonstrated an understanding of the instructions.   The patient was advised to call back or seek an in-person evaluation if the symptoms worsen or if the condition fails  to improve as anticipated.  I provided 25 minutes of non-face-to-face time during this encounter.   [name], DO

## 2018-10-26 ENCOUNTER — Ambulatory Visit: Payer: Medicare Other | Admitting: Podiatry

## 2018-10-29 ENCOUNTER — Other Ambulatory Visit: Payer: Self-pay | Admitting: Family Medicine

## 2018-11-09 ENCOUNTER — Other Ambulatory Visit: Payer: Self-pay

## 2018-11-09 ENCOUNTER — Ambulatory Visit: Payer: Medicare Other | Admitting: Podiatry

## 2018-11-09 ENCOUNTER — Encounter: Payer: Self-pay | Admitting: Podiatry

## 2018-11-09 ENCOUNTER — Ambulatory Visit (INDEPENDENT_AMBULATORY_CARE_PROVIDER_SITE_OTHER): Payer: Medicare Other

## 2018-11-09 VITALS — BP 122/76 | HR 94 | Temp 96.6°F | Resp 16

## 2018-11-09 DIAGNOSIS — M2012 Hallux valgus (acquired), left foot: Secondary | ICD-10-CM

## 2018-11-09 DIAGNOSIS — M2011 Hallux valgus (acquired), right foot: Secondary | ICD-10-CM

## 2018-11-09 DIAGNOSIS — M2021 Hallux rigidus, right foot: Secondary | ICD-10-CM

## 2018-11-09 NOTE — Progress Notes (Signed)
Subjective:   Patient ID: Veronica Solomon, female   DOB: 63 y.o.   MRN: [id number]   HPI Patient presents with painful bunion deformity right and states the motion of the joint has been reducing and she has pain if she tries to walk.  Patient states that it is gradually gotten worse over the last year and patient does not smoke and likes to be active   Review of Systems  All other systems reviewed and are negative.       Objective:  Physical Exam Vitals signs and nursing note reviewed.  Constitutional:      Appearance: She is well-developed.  Pulmonary:     Effort: Pulmonary effort is normal.  Musculoskeletal: Normal range of motion.  Skin:    General: Skin is warm.  Neurological:     Mental Status: She is alert.     Neurovascular status found to be intact muscle strength is adequate range of motion within normal limits.  Patient did have reduced range of motion first MPJ right with discomfort in the joint with palpation and prominence of the bone on both the dorsal and medial surface with redness and inability to wear shoe gear comfortably.  Patient is tried different shoe gear soaks without relief and does have history of fibromyalgia     Assessment:  Significant chronic hallux limitus deformity right with moderate structural bunion deformity also noted right with reduced range of motion of the joint but no crepitus upon motion     Plan:  H&P condition discussed at great length.  Patient states it is been getting worse and it increasingly hard for her to be active and she is interested in a long-term solution.  I recommended a biplanar type osteotomy with removal of bone spurs with the possibility that it may require implant if the cartilage is compromised.  I did explain this to her and she wants to do consent form today so she does not have another co-pay and I allowed her to have an read consent form going over alternative treatments complications and I reviewed all of  them with her.  I did explain that if we repair the joint it is possible in the future she will require implant or joint fusion and she understands this completely.  She signed consent form understands total recovery take 6 months to 1 year and had air fracture walker dispensed with all instructions on usage.  Encouraged her to call with any questions concerns she may have prior to procedure  X-ray indicates significant spur formation dorsal medial aspect first metatarsal right with moderate narrowing of the joint surface and elevated first metatarsal segment with healthy looking at first metatarsal left

## 2018-11-09 NOTE — Patient Instructions (Addendum)
Bunion  A bunion is a bump on the base of the big toe that forms when the bones of the big toe joint move out of position. Bunions may be small at first, but they often get larger over time. They can make walking painful. What are the causes? A bunion may be caused by:  Wearing narrow or pointed shoes that force the big toe to press against the other toes.  Abnormal foot development that causes the foot to roll inward (pronate).  Changes in the foot that are caused by certain diseases, such as rheumatoid arthritis or polio.  A foot injury. What increases the risk? The following factors may make you more likely to develop this condition:  Wearing shoes that squeeze the toes together.  Having certain diseases, such as: ? Rheumatoid arthritis. ? Polio. ? Cerebral palsy.  Having family members who have bunions.  Being born with a foot deformity, such as flat feet or low arches.  Doing activities that put a lot of pressure on the feet, such as ballet dancing. What are the signs or symptoms? The main symptom of a bunion is a noticeable bump on the big toe. Other symptoms may include:  Pain.  Swelling around the big toe.  Redness and inflammation.  Thick or hardened skin on the big toe or between the toes.  Stiffness or loss of motion in the big toe.  Trouble with walking. How is this diagnosed? A bunion may be diagnosed based on your symptoms, medical history, and activities. You may have tests, such as:  X-rays. These allow your health care provider to check the position of the bones in your foot and look for damage to your joint. They also help your health care provider determine the severity of your bunion and the best way to treat it.  Joint aspiration. In this test, a sample of fluid is removed from the toe joint. This test may be done if you are in a lot of pain. It helps rule out diseases that cause painful swelling of the joints, such as arthritis. How is this  treated? Treatment depends on the severity of your symptoms. The goal of treatment is to relieve symptoms and prevent the bunion from getting worse. Your health care provider may recommend:  Wearing shoes that have a wide toe box.  Using bunion pads to cushion the affected area.  Taping your toes together to keep them in a normal position.  Placing a device inside your shoe (orthotics) to help reduce pressure on your toe joint.  Taking medicine to ease pain, inflammation, and swelling.  Applying heat or ice to the affected area.  Doing stretching exercises.  Surgery to remove scar tissue and move the toes back into their normal position. This treatment is rare. Follow these instructions at home: Managing pain, stiffness, and swelling   If directed, put ice on the painful area: ? Put ice in a plastic bag. ? Place a towel between your skin and the bag. ? Leave the ice on for 20 minutes, 2-3 times a day. Activity   If directed, apply heat to the affected area before you exercise. Use the heat source that your health care provider recommends, such as a moist heat pack or a heating pad. ? Place a towel between your skin and the heat source. ? Leave the heat on for 20-30 minutes. ? Remove the heat if your skin turns bright red. This is especially important if you are unable to feel pain,   heat, or cold. You may have a greater risk of getting burned.  Do exercises as told by your health care provider. General instructions  Support your toe joint with proper footwear, shoe padding, or taping as told by your health care provider.  Take over-the-counter and prescription medicines only as told by your health care provider.  Keep all follow-up visits as told by your health care provider. This is important. Contact a health care provider if your symptoms:  Get worse.  Do not improve in 2 weeks. Get help right away if you have:  Severe pain and trouble with walking. Summary  A  bunion is a bump on the base of the big toe that forms when the bones of the big toe joint move out of position.  Bunions can make walking painful.  Treatment depends on the severity of your symptoms.  Support your toe joint with proper footwear, shoe padding, or taping as told by your health care provider. This information is not intended to replace advice given to you by your health care provider. Make sure you discuss any questions you have with your health care provider. Document Released: 05/31/2005 Document Revised: 10/11/2017 Document Reviewed: 10/11/2017 Elsevier Interactive Patient Education  2019 [place] Instructions  Congratulations, you have decided to take an important step towards improving your quality of life.  You can be assured that the doctors and staff at [facility] will be with you every step of the way.  Here are some important things you should know:  1. Plan to be at the surgery center/hospital at least 1 (one) hour prior to your scheduled time, unless otherwise directed by the surgical center/hospital staff.  You must have a responsible adult accompany you, remain during the surgery and drive you home.  Make sure you have directions to the surgical center/hospital to ensure you arrive on time. 2. If you are having surgery at [facility] or [facility], you will need a copy of your medical history and physical form from your family physician within one month prior to the date of surgery. We will give you a form for your primary physician to complete.  3. We make every effort to accommodate the date you request for surgery.  However, there are times where surgery dates or times have to be moved.  We will contact you as soon as possible if a change in schedule is required.   4. No aspirin/ibuprofen for one week before surgery.  If you are on aspirin, any non-steroidal anti-inflammatory medications (Mobic, Aleve, Ibuprofen) should not be  taken seven (7) days prior to your surgery.  You make take Tylenol for pain prior to surgery.  5. Medications - If you are taking daily heart and blood pressure medications, seizure, reflux, allergy, asthma, anxiety, pain or diabetes medications, make sure you notify the surgery center/hospital before the day of surgery so they can tell you which medications you should take or avoid the day of surgery. 6. No food or drink after midnight the night before surgery unless directed otherwise by surgical center/hospital staff. 7. No alcoholic beverages 42-VZDGL prior to surgery.  No smoking 24-hours prior or 24-hours after surgery. 8. Wear loose pants or shorts. They should be loose enough to fit over bandages, boots, and casts. 9. Don't wear slip-on shoes. Sneakers are preferred. 10. Bring your boot with you to the surgery center/hospital.  Also bring crutches or a walker if your physician has prescribed it for  you.  If you do not have this equipment, it will be provided for you after surgery. 11. If you have not been contacted by the surgery center/hospital by the day before your surgery, call to confirm the date and time of your surgery. 12. Leave-time from work may vary depending on the type of surgery you have.  Appropriate arrangements should be made prior to surgery with your employer. 13. Prescriptions will be provided immediately following surgery by your doctor.  Fill these as soon as possible after surgery and take the medication as directed. Pain medications will not be refilled on weekends and must be approved by the doctor. 14. Remove nail polish on the operative foot and avoid getting pedicures prior to surgery. 15. Wash the night before surgery.  The night before surgery wash the foot and leg well with water and the antibacterial soap provided. Be sure to pay special attention to beneath the toenails and in between the toes.  Wash for at least three (3) minutes. Rinse thoroughly with water and  dry well with a towel.  Perform this wash unless told not to do so by your physician.  Enclosed: 1 Ice pack (please put in freezer the night before surgery)   1 Hibiclens skin cleaner   Pre-op instructions  If you have any questions regarding the instructions, please do not hesitate to call our office.  [place]: [address], [place], [place] [address] -- [place]: [address], [place], [place] [address] -- [phone]  [place]: [place], [place] [address] -- [phone]  [place]: [address] [place], [place], [place] [address] -- [phone]  Website: [email or web address]

## 2018-11-09 NOTE — Progress Notes (Signed)
   Subjective:    Patient ID: Veronica Solomon, female    DOB: Sep 29, 1955, 63 y.o.   MRN: [id number]  HPI    Review of Systems  All other systems reviewed and are negative.      Objective:   Physical Exam        Assessment & Plan:

## 2018-11-16 ENCOUNTER — Other Ambulatory Visit: Payer: Self-pay | Admitting: Family Medicine

## 2018-11-16 ENCOUNTER — Telehealth: Payer: Self-pay | Admitting: *Deleted

## 2018-11-16 DIAGNOSIS — G894 Chronic pain syndrome: Secondary | ICD-10-CM

## 2018-11-16 MED ORDER — TRAMADOL HCL 50 MG PO TABS: ORAL_TABLET | ORAL | 0 refills | Status: DC

## 2018-11-16 NOTE — Telephone Encounter (Signed)
[facility] requesting tramadol  Last written: 10/06/18 Last ov: 10/23/18 Next ov: 04/26/19 Contract:08/06/19 UDS: 01/23/19

## 2018-11-16 NOTE — Telephone Encounter (Signed)
done

## 2018-12-04 ENCOUNTER — Telehealth: Payer: Self-pay | Admitting: *Deleted

## 2018-12-04 NOTE — Telephone Encounter (Signed)
DOS 12/05/2018; 09311 - [place] BUNIONECTOMY BI-PLANAR RIGHT FOOT  [employer]: Effective Date - 06/14/2018 - -   In-Network  Individual In-Network (Service Year) Deductible Member's plan does not have a deductible  Out-of-Pocket $560.00 MET YTD  $3,440.00 remaining  $4,000.00 Plan Amt.  $250.00 Copayment for Medicare-covered surgery provided to you at an outpatient hospital or ambulatory surgical center, including but not limited to hospital or other facility charges and physician or surgical charges.     This [employer] Medicare Advantage members plan does not currently require a prior authorization for these services. If you have general questions about the prior authorization requirements, please call us at [phone] or visit [email or web address] > Clinician Resources > Advance and Admission Notification Requirements. The number above acknowledges your notification. Please write this number down for future reference. Notification is not a guarantee of coverage or payment.  Decision ID #:[id number]

## 2018-12-05 ENCOUNTER — Encounter: Payer: Self-pay | Admitting: Podiatry

## 2018-12-05 DIAGNOSIS — M2021 Hallux rigidus, right foot: Secondary | ICD-10-CM

## 2018-12-07 ENCOUNTER — Telehealth: Payer: Self-pay | Admitting: *Deleted

## 2018-12-07 NOTE — Telephone Encounter (Signed)
Called and spoke with the patient and the patient stated that it was looking good and was staying off it and the bandage looked fine and there was not any fever or chills and no nausea and also was icing and elevating the foot and I stated to call the [place] office if any concerns or questions at [phone]. Lattie Haw

## 2018-12-13 ENCOUNTER — Other Ambulatory Visit: Payer: Self-pay

## 2018-12-13 ENCOUNTER — Ambulatory Visit (INDEPENDENT_AMBULATORY_CARE_PROVIDER_SITE_OTHER): Payer: Medicare Other

## 2018-12-13 ENCOUNTER — Ambulatory Visit (INDEPENDENT_AMBULATORY_CARE_PROVIDER_SITE_OTHER): Payer: Medicare Other | Admitting: Podiatry

## 2018-12-13 ENCOUNTER — Encounter: Payer: Self-pay | Admitting: Podiatry

## 2018-12-13 VITALS — Temp 97.4°F

## 2018-12-13 DIAGNOSIS — M2021 Hallux rigidus, right foot: Secondary | ICD-10-CM | POA: Diagnosis not present

## 2018-12-13 DIAGNOSIS — Z09 Encounter for follow-up examination after completed treatment for conditions other than malignant neoplasm: Secondary | ICD-10-CM

## 2018-12-13 DIAGNOSIS — M2011 Hallux valgus (acquired), right foot: Secondary | ICD-10-CM

## 2018-12-13 DIAGNOSIS — M2012 Hallux valgus (acquired), left foot: Secondary | ICD-10-CM

## 2018-12-13 NOTE — Progress Notes (Signed)
Subjective:   Patient ID: Veronica Solomon, female   DOB: 63 y.o.   MRN: [id number]   HPI Patient presents stating I am doing very well with minimal discomfort   ROS      Objective:  Physical Exam  Neurovascular status intact negative Homans sign noted first metatarsal healing well wound edges well coapted with good range of motion and no crepitus of the joint currently but the motion still is restricted due to the longstanding pathology     Assessment:  Doing well post biplanar osteotomy right     Plan:  H&P reviewed condition and continued immobilization elevation compression and I am referring to physical therapy for range of motion.  Reappoint to recheck again approximate 3 weeks or earlier if needed  X-ray indicates that the osteotomy is healing well fixation in place with good plantar flexion of the metatarsal

## 2018-12-25 ENCOUNTER — Encounter: Payer: Self-pay | Admitting: Family Medicine

## 2018-12-27 ENCOUNTER — Encounter: Payer: Self-pay | Admitting: Podiatry

## 2018-12-27 ENCOUNTER — Ambulatory Visit (INDEPENDENT_AMBULATORY_CARE_PROVIDER_SITE_OTHER): Payer: Medicare Other

## 2018-12-27 ENCOUNTER — Ambulatory Visit (INDEPENDENT_AMBULATORY_CARE_PROVIDER_SITE_OTHER): Payer: Self-pay | Admitting: Podiatry

## 2018-12-27 ENCOUNTER — Other Ambulatory Visit: Payer: Self-pay

## 2018-12-27 DIAGNOSIS — M2011 Hallux valgus (acquired), right foot: Secondary | ICD-10-CM

## 2018-12-27 DIAGNOSIS — M2012 Hallux valgus (acquired), left foot: Secondary | ICD-10-CM

## 2018-12-27 DIAGNOSIS — M2021 Hallux rigidus, right foot: Secondary | ICD-10-CM | POA: Diagnosis not present

## 2018-12-27 DIAGNOSIS — Z09 Encounter for follow-up examination after completed treatment for conditions other than malignant neoplasm: Secondary | ICD-10-CM

## 2018-12-29 ENCOUNTER — Encounter: Payer: Self-pay | Admitting: Family Medicine

## 2018-12-29 ENCOUNTER — Ambulatory Visit: Payer: Medicare Other | Admitting: Family Medicine

## 2018-12-29 ENCOUNTER — Other Ambulatory Visit: Payer: Self-pay

## 2018-12-29 VITALS — BP 143/63 | HR 88 | Temp 98.7°F | Resp 18 | Ht 69.0 in | Wt 244.6 lb

## 2018-12-29 DIAGNOSIS — L918 Other hypertrophic disorders of the skin: Secondary | ICD-10-CM | POA: Diagnosis not present

## 2018-12-29 NOTE — Progress Notes (Signed)
Skin Tag Removal Procedure Note  Pre-operative Diagnosis: Classic skin tags (acrochordon)  Post-operative Diagnosis: Classic skin tags (acrochordon)  Locations:anterior neck-left side   Indications: irritated / painful  Anesthesia: Lidocaine 1% without epinephrine without added sodium bicarbonate  Procedure Details  The risks (including bleeding and infection) and benefits of the procedure and Verbal informed consent obtained. Using sterile iris scissors, multiple skin tags were snipped off at their bases after cleansing with Betadine.  Bleeding was controlled by pressure.   Findings: Due to change in color and irritation -- it was sent for pathology  Condition: Stable  Complications: none.  Plan: 1. Instructed to keep the wounds dry and covered for 24-48h and clean thereafter. 2. Warning signs of infection were reviewed.   3. Recommended that the patient use OTC acetaminophen as needed for pain.  4. Return as needed.

## 2018-12-29 NOTE — Patient Instructions (Addendum)
Skin Tag, Adult  A skin tag (acrochordon) is a soft, extra growth of skin. Most skin tags are flesh-colored and rarely bigger than a pencil eraser. They commonly form near areas where there are folds in the skin, such as the armpit or groin. Skin tags are not dangerous, and they do not spread from person to person (are not contagious). You may have one skin tag or several. Skin tags do not require treatment. However, your health care provider may recommend removal of a skin tag if it:  Gets irritated from clothing.  Bleeds.  Is visible and unsightly. Your health care provider can remove skin tags with a simple surgical procedure or a procedure that involves freezing the skin tag. Follow these instructions at home:  Watch for any changes in your skin tag. A normal skin tag does not require any other special care at home.  Take over-the-counter and prescription medicines only as told by your health care provider.  Keep all follow-up visits as told by your health care provider. This is important. Contact a health care provider if:  You have a skin tag that: ? Becomes painful. ? Changes color. ? Bleeds. ? Swells.  You develop more skin tags. This information is not intended to replace advice given to you by your health care provider. Make sure you discuss any questions you have with your health care provider. Document Released: 06/15/2015 Document Revised: 05/13/2017 Document Reviewed: 06/15/2015 Elsevier Patient Education  2020 [employer] Inc[employer]  

## 2018-12-30 NOTE — Progress Notes (Signed)
   Subjective:  Patient presents today status post bunionectomy right. DOS: 12/05/2018. She states she is doing very well. She denies any significant pain or modifying factors. She has been using the post op shoe as directed. Patient is here for further evaluation and treatment.    Past Medical History:  Diagnosis Date  . Adenomatous colon polyp   . Allergy   . C. difficile colitis   . Diverticulosis   . Hyperlipidemia   . Hypertension   . IBS (irritable bowel syndrome)   . Neuromuscular disorder [facility])       Objective/Physical Exam Neurovascular status intact.  Skin incisions appear to be well coapted. No sign of infectious process noted. No dehiscence. No active bleeding noted. Moderate edema noted to the surgical extremity.  Radiographic Exam:  Orthopedic hardware and osteotomies sites appear to be stable with routine healing.  Assessment: 1. s/p bunionectomy right. DOS: 12/05/2018   Plan of Care:  1. Patient was evaluated. X-rays reviewed 2. Continue using post op shoe for one week. Then transition into sneakers.  3. Compression anklet dispensed.  4. Return to clinic in 4 weeks.    Edrick Kins, DPM [facility]  Dr. Edrick Kins, [address]                                        [place], [place] [address]                Office [phone]  Fax [phone]

## 2019-01-05 ENCOUNTER — Other Ambulatory Visit: Payer: Self-pay | Admitting: Family Medicine

## 2019-01-05 DIAGNOSIS — G894 Chronic pain syndrome: Secondary | ICD-10-CM

## 2019-01-08 NOTE — Telephone Encounter (Signed)
Requesting: Tramadol Contract: 01/26/2018 UDS: 07/25/2018, next screen 01/23/2019 Last OV: 12/29/2018 Next OV: 04/26/2019 Last Refill: 11/16/2018, #90--0 RF Database:   Please advise

## 2019-01-09 ENCOUNTER — Other Ambulatory Visit: Payer: Self-pay | Admitting: Family Medicine

## 2019-01-09 DIAGNOSIS — G894 Chronic pain syndrome: Secondary | ICD-10-CM

## 2019-01-09 MED ORDER — TRAMADOL HCL 50 MG PO TABS: ORAL_TABLET | ORAL | 0 refills | Status: DC

## 2019-01-16 ENCOUNTER — Telehealth: Payer: Medicare Other

## 2019-01-16 ENCOUNTER — Ambulatory Visit (INDEPENDENT_AMBULATORY_CARE_PROVIDER_SITE_OTHER): Payer: Medicare Other

## 2019-01-16 ENCOUNTER — Other Ambulatory Visit: Payer: Self-pay

## 2019-01-16 ENCOUNTER — Encounter (HOSPITAL_COMMUNITY): Payer: Self-pay | Admitting: Emergency Medicine

## 2019-01-16 ENCOUNTER — Ambulatory Visit (HOSPITAL_COMMUNITY)
Admission: EM | Admit: 2019-01-16 | Discharge: 2019-01-16 | Disposition: A | Discharge status: Home / Self Care | Payer: Medicare Other | Attending: Family Medicine | Admitting: Family Medicine

## 2019-01-16 DIAGNOSIS — R0781 Pleurodynia: Secondary | ICD-10-CM

## 2019-01-16 NOTE — Discharge Instructions (Addendum)
Your x ray did not show any broken ribs Most likely bad bruise You can do ice to the area and pain mediation as needed.  Make sure that you are doing deep breathing exercises to prevent lung infection.

## 2019-01-16 NOTE — ED Triage Notes (Signed)
Pt states Saturday evening/sunday morning she had a dream where she was being chased and she jumped off her bed and landed on top of a plastic cat carrier on her L rib cage area. Pain and tenderness to L rib cage.

## 2019-01-17 NOTE — ED Provider Notes (Signed)
[place]    CSN: [id number] Arrival date & time: 01/16/19  1456     History   Chief Complaint Chief Complaint  Patient presents with  . Fall  . Rib Pain    HPI [facility] Veronica Solomon is a 63 y.o. female.   Pt is a 63 year old female that presents with left rib pain. This has been present and remained the same x 4 days. Occurred after she woke up from a dream, jumping off her bed and landed on top of a plastic container on her left rib cage. She is having pain and tenderness to the left rib cage and pain with moving and deep breathing. No SOB. No obvious bruising, deformity.   ROS per HPI      Past Medical History:  Diagnosis Date  . Adenomatous colon polyp   . Allergy   . C. difficile colitis   . Diverticulosis   . Hyperlipidemia   . Hypertension   . IBS (irritable bowel syndrome)   . Neuromuscular disorder [facility])     Patient Active Problem List   Diagnosis Date Noted  . CAD (coronary artery disease) 03/07/2018  . Sensorineural hearing loss (SNHL), 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  . Tinnitus, 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  . Chronic swimmer's ear of both sides 02/22/2018  . Accessory skin tags 12/27/2016  . Chronic daily headache 09/01/2016  . Memory loss due to medical condition 06/30/2016  . Hyperlipidemia 12/30/2014  . Obesity (BMI 30-39.9) 06/25/2013  . Numerous moles 11/24/2012  . Chronic pain syndrome 09/29/2010  . COUGH 12/04/2009  . CONSTIPATION 07/18/2009  . Myalgia and myositis, unspecified 04/14/2009  . DEPRESSION 03/19/2009  . PARESTHESIA 03/19/2009  . DIARRHEA, INFECTIOUS 03/18/2009  . DIARRHEA 03/18/2009  . PERSONAL HX COLONIC POLYPS 03/18/2009  . FATIGUE 03/10/2009  . Essential hypertension 02/28/2009  . IBS 02/14/2009  . POSTMENOPAUSAL STATUS 12/30/2008  . 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 04/01/2008  . HEADACHE 04/01/2008  . ACUTE BRONCHITIS 03/07/2008  . URI 07/10/2007    Past Surgical History:  Procedure Laterality Date   . ABDOMINAL HYSTERECTOMY  2002   fibroids    OB History   No obstetric history on file.      Home Medications    Prior to Admission medications   Medication Sig Start Date End Date Taking? Authorizing Provider  amLODipine (NORVASC) 5 MG tablet TAKE 1 TABLET(5 MG) BY MOUTH DAILY 09/14/18   Carollee Herter, Yvonne R, DO  atorvastatin (LIPITOR) 20 MG tablet TAKE 1 TABLET BY MOUTH DAILY 10/30/18   Carollee Herter, Kendrick Fries R, DO  celecoxib (CELEBREX) 200 MG capsule TAKE 1 CAPSULE BY MOUTH 1 TO 2 TIMES DAILY AS NEEDED 07/01/17   Carollee Herter, Alferd Apa, DO  cetirizine (ZYRTEC) 10 MG tablet Take 1 tablet (10 mg total) by mouth daily. 09/03/18   Waldon Merl, PA-C  DULoxetine (CYMBALTA) 60 MG capsule TAKE 2 CAPSULES BY MOUTH EVERY DAY 09/14/18   Carollee Herter, Alferd Apa, DO  fluticasone (FLONASE) 50 MCG/ACT nasal spray Place 2 sprays into both nostrils daily. 09/03/18   Waldon Merl, PA-C  HYDROcodone-acetaminophen (NORCO) 10-325 MG tablet Take 1 tablet by mouth every 8 (eight) hours as needed. 07/25/18   [name], DO  naphazoline-pheniramine (NAPHCON-A) 0.025-0.3 % ophthalmic solution Place 2 drops into both eyes 2 (two) times daily as needed for eye irritation or allergies. 09/03/18   Waldon Merl, PA-C  pregabalin (LYRICA) 75 MG capsule TAKE 1 CAPSULE(75 MG) BY  MOUTH TWICE DAILY 08/16/18   Carollee Herter, Alferd Apa, DO  traMADol Veatrice Bourbon) 50 MG tablet 1 po q6h prn 01/09/19   [name], DO    Family History Family History  Problem Relation Age of Onset  . Hypertension Mother   . Depression Mother   . Heart attack Mother   . Uterine cancer Mother   . Hypertension Paternal Grandmother   . Diabetes Maternal Grandmother   . Breast cancer Sister     Social History Social History   Tobacco Use  . Smoking status: Former Smoker    Packs/day: 1.00    Years: 42.00    Pack years: 42.00    Types: Cigarettes    Quit date: 12/29/2011    Years since quitting: 7.0  . Smokeless tobacco:  Former Systems developer  . Tobacco comment: Quit 12/2011  Substance Use Topics  . Alcohol use: Yes    Comment: rarely  . Drug use: No     Allergies   Paregoric, Penicillins, and Procaine hcl   Review of Systems Review of Systems   Physical Exam Triage Vital Signs ED Triage Vitals  Enc Vitals Group     BP 01/16/19 1515 (!) 157/90     Pulse Rate 01/16/19 1515 83     Resp 01/16/19 1515 16     Temp 01/16/19 1515 97.6 F (36.4 C)     Temp src --      SpO2 01/16/19 1515 95 %     Weight --      Height --      Head Circumference --      Peak Flow --      Pain Score 01/16/19 1514 9     Pain Loc --      Pain Edu? --      Excl. in [place]? --    No data found.  Updated Vital Signs BP (!) 157/90   Pulse 83   Temp 97.6 F (36.4 C)   Resp 16   SpO2 95%   Visual Acuity Right Eye Distance:   Left Eye Distance:   Bilateral Distance:    Right Eye Near:   Left Eye Near:    Bilateral Near:     Physical Exam Vitals signs and nursing note reviewed.  Constitutional:      General: She is not in acute distress.    Appearance: Normal appearance. She is not ill-appearing, toxic-appearing or diaphoretic.  HENT:     Head: Normocephalic and atraumatic.     Nose: Nose normal.  Eyes:     Conjunctiva/sclera: Conjunctivae normal.  Neck:     Musculoskeletal: Normal range of motion.  Cardiovascular:     Rate and Rhythm: Normal rate and regular rhythm.  Pulmonary:     Effort: Pulmonary effort is normal.     Breath sounds: Normal breath sounds.  Chest:       Comments: TTP No bruising, swelling, deformity.  Musculoskeletal: Normal range of motion.  Skin:    General: Skin is warm and dry.  Neurological:     Mental Status: She is alert.  Psychiatric:        Mood and Affect: Mood normal.      UC Treatments / Results  Labs (all labs ordered are listed, but only abnormal results are displayed) Labs Reviewed - No data to display  EKG   Radiology Dg Ribs Unilateral W/chest Left   Result Date: 01/16/2019 CLINICAL DATA:  LEFT rib pain post injury EXAM: LEFT  RIBS AND CHEST - 3+ VIEW COMPARISON:  CT chest 03/03/2018 FINDINGS: Normal heart size, mediastinal contours, and pulmonary vascularity. Lungs clear. No acute infiltrate, pleural effusion, or pneumothorax. Osseous mineralization normal. BB placed at site of symptoms lower anterolateral LEFT chest. No rib fracture or bone destruction. IMPRESSION: No acute abnormalities. Electronically Signed   By: Lavonia Dana M.D.   On: 01/16/2019 15:38    Procedures Procedures (including critical care time)  Medications Ordered in UC Medications - No data to display  Initial Impression / Assessment and Plan / UC Course  I have reviewed the triage vital signs and the nursing notes.  Pertinent labs & imaging results that were available during my care of the patient were reviewed by me and considered in my medical decision making (see chart for details).     X ray negative for fracture  Most likely bad bruise.  Ice, rest, ibuprofen for pain  Recommended deep breathing exercises to prevent pneumonia Follow up as needed for continued or worsening symptoms  Final Clinical Impressions(s) / UC Diagnoses   Final diagnoses:  Rib pain on left side     Discharge Instructions     Your x ray did not show any broken ribs Most likely bad bruise You can do ice to the area and pain mediation as needed.  Make sure that you are doing deep breathing exercises to prevent lung infection.      ED Prescriptions    None     Controlled Substance Prescriptions [place] Controlled Substance Registry consulted? Not Applicable   Orvan July, NP 01/17/19 [phone]

## 2019-01-24 ENCOUNTER — Encounter: Payer: Self-pay | Admitting: Podiatry

## 2019-01-24 ENCOUNTER — Ambulatory Visit (INDEPENDENT_AMBULATORY_CARE_PROVIDER_SITE_OTHER): Payer: Self-pay | Admitting: Podiatry

## 2019-01-24 ENCOUNTER — Other Ambulatory Visit: Payer: Self-pay

## 2019-01-24 ENCOUNTER — Ambulatory Visit (INDEPENDENT_AMBULATORY_CARE_PROVIDER_SITE_OTHER): Payer: Medicare Other

## 2019-01-24 VITALS — Temp 97.2°F

## 2019-01-24 DIAGNOSIS — M2021 Hallux rigidus, right foot: Secondary | ICD-10-CM | POA: Diagnosis not present

## 2019-01-24 NOTE — Progress Notes (Signed)
Subjective:   Patient ID: Veronica Solomon Hence, female   DOB: 63 y.o.   MRN: [id number]   HPI Patient states overall doing well with her right foot and states she still has some swelling but she is not been elevating and compressing it like we had recommended   ROS      Objective:  Physical Exam  Neurovascular status intact negative Homans sign was noted with patient's right foot showing swelling in the dorsal aspect but localized with incision site healed well and excellent range of motion with no crepitus     Assessment:  Overall doing well but does have some moderate swelling dorsum right foot     Plan:  X-ray H&P discussed with patient and today I recommended the importance of compression elevation and she promises to do that and she will be seen back over the next several months or earlier if any issues were to occur  X-rays indicate the osteotomy is healed well fixation in place joint congruence

## 2019-02-06 ENCOUNTER — Other Ambulatory Visit: Payer: Self-pay | Admitting: Family Medicine

## 2019-02-06 DIAGNOSIS — Z1231 Encounter for screening mammogram for malignant neoplasm of breast: Secondary | ICD-10-CM

## 2019-02-10 ENCOUNTER — Encounter: Payer: Self-pay | Admitting: Family Medicine

## 2019-02-12 MED ORDER — CELECOXIB 200 MG PO CAPS: 200.0000 mg | ORAL_CAPSULE | Freq: Two times a day (BID) | ORAL | 0 refills | Status: DC | PRN

## 2019-02-13 ENCOUNTER — Encounter: Payer: Self-pay | Admitting: Family Medicine

## 2019-02-13 DIAGNOSIS — G894 Chronic pain syndrome: Secondary | ICD-10-CM

## 2019-02-14 MED ORDER — PREGABALIN 75 MG PO CAPS: ORAL_CAPSULE | ORAL | 1 refills | Status: DC

## 2019-02-14 NOTE — Telephone Encounter (Signed)
Lyrica refill.   Last OV: 12/29/2018 Last Fill: 08/16/2018 #180 and 1RF Pt sig: 1 capsule bid UDS: 07/25/2018 low risk

## 2019-02-21 ENCOUNTER — Encounter: Payer: Self-pay | Admitting: Family Medicine

## 2019-02-22 ENCOUNTER — Other Ambulatory Visit: Payer: Self-pay | Admitting: Family Medicine

## 2019-02-22 DIAGNOSIS — G894 Chronic pain syndrome: Secondary | ICD-10-CM

## 2019-02-22 MED ORDER — TRAMADOL HCL 50 MG PO TABS: ORAL_TABLET | ORAL | 0 refills | Status: DC

## 2019-02-22 NOTE — Telephone Encounter (Signed)
Refill request never received

## 2019-03-13 ENCOUNTER — Other Ambulatory Visit: Payer: Self-pay | Admitting: Family Medicine

## 2019-03-13 MED ORDER — AMLODIPINE BESYLATE 5 MG PO TABS: ORAL_TABLET | ORAL | 1 refills | Status: DC

## 2019-03-13 MED ORDER — ATORVASTATIN CALCIUM 20 MG PO TABS: 20.0000 mg | ORAL_TABLET | Freq: Every day | ORAL | 1 refills | Status: DC

## 2019-03-20 ENCOUNTER — Other Ambulatory Visit: Payer: Self-pay

## 2019-03-20 ENCOUNTER — Ambulatory Visit
Admission: RE | Admit: 2019-03-20 | Discharge: 2019-03-20 | Disposition: A | Discharge status: Home / Self Care | Payer: Medicare Other | Source: Ambulatory Visit | Attending: Family Medicine | Admitting: Family Medicine

## 2019-03-20 DIAGNOSIS — Z1231 Encounter for screening mammogram for malignant neoplasm of breast: Secondary | ICD-10-CM

## 2019-04-09 ENCOUNTER — Telehealth: Payer: Self-pay | Admitting: *Deleted

## 2019-04-09 ENCOUNTER — Other Ambulatory Visit: Payer: Self-pay | Admitting: Family Medicine

## 2019-04-09 DIAGNOSIS — G894 Chronic pain syndrome: Secondary | ICD-10-CM

## 2019-04-09 MED ORDER — TRAMADOL HCL 50 MG PO TABS: ORAL_TABLET | ORAL | 0 refills | Status: DC

## 2019-04-09 NOTE — Telephone Encounter (Signed)
sent 

## 2019-04-09 NOTE — Telephone Encounter (Signed)
[facility] [address] requesting refill on tramadol.  Last written: 02/22/19 Last ov: 12/29/18 Next ov: 04/26/19 Contract: will get at next visit UDS: will get at next visit

## 2019-04-26 ENCOUNTER — Other Ambulatory Visit: Payer: Self-pay

## 2019-04-26 ENCOUNTER — Ambulatory Visit: Payer: Medicare Other | Admitting: Family Medicine

## 2019-04-26 ENCOUNTER — Encounter: Payer: Self-pay | Admitting: Family Medicine

## 2019-04-26 VITALS — BP 140/76 | HR 94 | Temp 97.3°F | Resp 18 | Ht 69.0 in | Wt 246.6 lb

## 2019-04-26 DIAGNOSIS — T7840XA Allergy, unspecified, initial encounter: Secondary | ICD-10-CM

## 2019-04-26 DIAGNOSIS — E785 Hyperlipidemia, unspecified: Secondary | ICD-10-CM | POA: Diagnosis not present

## 2019-04-26 DIAGNOSIS — G894 Chronic pain syndrome: Secondary | ICD-10-CM

## 2019-04-26 DIAGNOSIS — Z79899 Other long term (current) drug therapy: Secondary | ICD-10-CM

## 2019-04-26 DIAGNOSIS — F320 Major depressive disorder, single episode, mild: Secondary | ICD-10-CM

## 2019-04-26 DIAGNOSIS — I10 Essential (primary) hypertension: Secondary | ICD-10-CM

## 2019-04-26 LAB — COMPREHENSIVE METABOLIC PANEL
ALT: 17 U/L (ref 0–35)
AST: 18 U/L (ref 0–37)
Albumin: 4.7 g/dL (ref 3.5–5.2)
Alkaline Phosphatase: 123 U/L — ABNORMAL HIGH (ref 39–117)
BUN: 20 mg/dL (ref 6–23)
CO2: 33 mEq/L — ABNORMAL HIGH (ref 19–32)
Calcium: 9.8 mg/dL (ref 8.4–10.5)
Chloride: 103 mEq/L (ref 96–112)
Creatinine, Ser: 0.82 mg/dL (ref 0.40–1.20)
GFR: 70.49 mL/min (ref >60.00)
Glucose, Bld: 92 mg/dL (ref 70–99)
Potassium: 5 mEq/L (ref 3.5–5.1)
Sodium: 143 mEq/L (ref 135–145)
Total Bilirubin: 0.5 mg/dL (ref 0.2–1.2)
Total Protein: 7.1 g/dL (ref 6.0–8.3)

## 2019-04-26 LAB — LIPID PANEL
Cholesterol: 164 mg/dL (ref 0–200)
HDL: 56.1 mg/dL (ref >39.00)
LDL Cholesterol: 76 mg/dL (ref 0–99)
NonHDL: 108.21
Total CHOL/HDL Ratio: 3
Triglycerides: 162 mg/dL — ABNORMAL HIGH (ref 0.0–149.0)
VLDL: 32.4 mg/dL (ref 0.0–40.0)

## 2019-04-26 MED ORDER — FLUTICASONE PROPIONATE 50 MCG/ACT NA SUSP: 2.0000 | Freq: Every day | NASAL | 5 refills | Status: DC

## 2019-04-26 NOTE — Progress Notes (Signed)
Patient ID: Veronica Solomon Hence, female    DOB: 26-May-1956  Age: 63 y.o. MRN: [id number]    Subjective:  Subjective  HPI [facility] presents for f/u pain meds , chol, bp.  No new complaints.   Review of Systems  Constitutional: Negative for activity change, appetite change, chills, fatigue, fever and unexpected weight change.  HENT: Negative for congestion and hearing loss.   Eyes: Negative for discharge.  Respiratory: Negative for cough and shortness of breath.   Cardiovascular: Negative for chest pain, palpitations and leg swelling.  Gastrointestinal: Negative for abdominal pain, blood in stool, constipation, diarrhea, nausea and vomiting.  Genitourinary: Negative for dysuria, frequency, hematuria and urgency.  Musculoskeletal: Positive for arthralgias and myalgias. Negative for back pain.  Skin: Negative for rash.  Allergic/Immunologic: Negative for environmental allergies.  Neurological: Negative for dizziness, weakness and headaches.  Hematological: Does not bruise/bleed easily.  Psychiatric/Behavioral: Negative for behavioral problems, dysphoric mood and suicidal ideas. The patient is not nervous/anxious.     History Past Medical History:  Diagnosis Date   Adenomatous colon polyp    Allergy    C. difficile colitis    Diverticulosis    Hyperlipidemia    Hypertension    IBS (irritable bowel syndrome)    Neuromuscular disorder ([place])     She has a past surgical history that includes Abdominal hysterectomy (2002).   Her family history includes Breast cancer in her sister; Depression in her mother; Diabetes in her maternal grandmother; Heart attack in her mother; Hypertension in her mother and paternal grandmother; Uterine cancer in her mother.She reports that she quit smoking about 7 years ago. Her smoking use included cigarettes. She has a 42.00 pack-year smoking history. She has quit using smokeless tobacco. She reports current alcohol use. She reports that  she does not use drugs.  Current Outpatient Medications on File Prior to Visit  Medication Sig Dispense Refill   amLODipine (NORVASC) 5 MG tablet TAKE 1 TABLET(5 MG) BY MOUTH DAILY 90 tablet 1   atorvastatin (LIPITOR) 20 MG tablet Take 1 tablet (20 mg total) by mouth daily. 90 tablet 1   celecoxib (CELEBREX) 200 MG capsule Take 1 capsule (200 mg total) by mouth 2 (two) times daily as needed for moderate pain. 180 capsule 0   cetirizine (ZYRTEC) 10 MG tablet Take 1 tablet (10 mg total) by mouth daily. 30 tablet 0   DULoxetine (CYMBALTA) 60 MG capsule TAKE 2 CAPSULES BY MOUTH EVERY DAY 180 capsule 1   HYDROcodone-acetaminophen (NORCO) 10-325 MG tablet Take 1 tablet by mouth every 8 (eight) hours as needed. 30 tablet 0   naphazoline-pheniramine (NAPHCON-A) 0.025-0.3 % ophthalmic solution Place 2 drops into both eyes 2 (two) times daily as needed for eye irritation or allergies. 15 mL 0   pregabalin (LYRICA) 75 MG capsule TAKE 1 CAPSULE(75 MG) BY MOUTH TWICE DAILY 180 capsule 1   traMADol (ULTRAM) 50 MG tablet 1 po q6h prn 90 tablet 0   No current facility-administered medications on file prior to visit.      Objective:  Objective  Physical Exam Vitals signs and nursing note reviewed.  Constitutional:      Appearance: She is well-developed.  HENT:     Head: Normocephalic and atraumatic.  Eyes:     Conjunctiva/sclera: Conjunctivae normal.  Neck:     Musculoskeletal: Normal range of motion and neck supple.     Thyroid: No thyromegaly.     Vascular: No carotid bruit or JVD.  Cardiovascular:  Rate and Rhythm: Normal rate and regular rhythm.     Heart sounds: Normal heart sounds. No murmur.  Pulmonary:     Effort: Pulmonary effort is normal. No respiratory distress.     Breath sounds: Normal breath sounds. No wheezing or rales.  Chest:     Chest wall: No tenderness.  Neurological:     Mental Status: She is alert and oriented to person, place, and time.    BP 140/76 (BP  Location: Right Arm, Patient Position: Sitting, Cuff Size: Large)    Pulse 94    Temp (!) 97.3 F (36.3 C) (Temporal)    Resp 18    Ht 5\' 9"  (1.753 m)    Wt 246 lb 9.6 oz (111.9 kg)    SpO2 96%    BMI 36.42 kg/m  Wt Readings from Last 3 Encounters:  04/26/19 246 lb 9.6 oz (111.9 kg)  12/29/18 244 lb 9.6 oz (110.9 kg)  07/25/18 237 lb 6.4 oz (107.7 kg)     Lab Results  Component Value Date   WBC 8.9 07/03/2015   HGB 13.8 07/03/2015   HCT 42.2 07/03/2015   PLT 262.0 07/03/2015   GLUCOSE 92 04/26/2019   CHOL 164 04/26/2019   TRIG 162.0 (H) 04/26/2019   HDL 56.10 04/26/2019   LDLDIRECT 195.7 01/12/2010   LDLCALC 76 04/26/2019   ALT 17 04/26/2019   AST 18 04/26/2019   NA 143 04/26/2019   K 5.0 04/26/2019   CL 103 04/26/2019   CREATININE 0.82 04/26/2019   BUN 20 04/26/2019   CO2 33 (H) 04/26/2019   TSH 1.13 01/26/2018   HGBA1C 5.8 12/30/2014   MICROALBUR 4.1 (H) 11/24/2012    Mm 3d Screen Breast Bilateral  Result Date: 03/21/2019 CLINICAL DATA:  Screening. EXAM: DIGITAL SCREENING BILATERAL MAMMOGRAM WITH TOMO AND CAD COMPARISON:  Previous exam(s). ACR Breast Density Category b: There are scattered areas of fibroglandular density. FINDINGS: There are no findings suspicious for malignancy. Images were processed with CAD. IMPRESSION: No mammographic evidence of malignancy. A result letter of this screening mammogram will be mailed directly to the patient. RECOMMENDATION: Screening mammogram in one year. (Code:SM-B-01Y) BI-RADS CATEGORY  1: Negative. Electronically Signed   By: Margarette Canada M.D.   On: 03/21/2019 08:09     Assessment & Plan:  Plan  I am having [name]. Delpizzo maintain her HYDROcodone-acetaminophen, cetirizine, naphazoline-pheniramine, celecoxib, pregabalin, DULoxetine, atorvastatin, amLODipine, traMADol, and fluticasone.  Meds ordered this encounter  Medications   fluticasone (FLONASE) 50 MCG/ACT nasal spray    Sig: Place 2 sprays into both nostrils daily.     Dispense:  16 g    Refill:  5    Problem List Items Addressed This Visit      Unprioritized   Chronic pain syndrome    Stable Refill pain meds      Relevant Orders   Pain Mgmt, Profile 8 w/Conf, U   Current mild episode of major depressive disorder ([place])   Essential hypertension    Well controlled, no changes to meds. Encouraged heart healthy diet such as the DASH diet and exercise as tolerated.       Hyperlipidemia    Tolerating statin, encouraged heart healthy diet, avoid trans fats, minimize simple carbs and saturated fats. Increase exercise as tolerated      Relevant Orders   Lipid panel (Completed)   Comprehensive metabolic panel (Completed)    Other Visit Diagnoses    Allergy, initial encounter    -  Primary  Relevant Medications   fluticasone (FLONASE) 50 MCG/ACT nasal spray   High risk medication use       Relevant Orders   Pain Mgmt, Profile 8 w/Conf, U      Follow-up: Return in about 3 months (around 07/27/2019), or if symptoms worsen or fail to improve.  [name], DO

## 2019-04-26 NOTE — Patient Instructions (Signed)
Myofascial Pain Syndrome and Fibromyalgia Myofascial pain syndrome and fibromyalgia are both pain disorders. This pain may be felt mainly in your muscles.  Myofascial pain syndrome: ? Always has tender points in the muscle that will cause pain when pressed (trigger points). The pain may come and go. ? Usually affects your neck, upper back, and shoulder areas. The pain often radiates into your arms and hands.  Fibromyalgia: ? Has muscle pains and tenderness that come and go. ? Is often associated with fatigue and sleep problems. ? Has trigger points. ? Tends to be long-lasting (chronic), but is not life-threatening. Fibromyalgia and myofascial pain syndrome are not the same. However, they often occur together. If you have both conditions, each can make the other worse. Both are common and can cause enough pain and fatigue to make day-to-day activities difficult. Both can be hard to diagnose because their symptoms are common in many other conditions. What are the causes? The exact causes of these conditions are not known. What increases the risk? You are more likely to develop this condition if:  You have a family history of the condition.  You have certain triggers, such as: ? Spine disorders. ? An injury (trauma) or other physical stressors. ? Being under a lot of stress. ? Medical conditions such as osteoarthritis, rheumatoid arthritis, or lupus. What are the signs or symptoms? Fibromyalgia The main symptom of fibromyalgia is widespread pain and tenderness in your muscles. Pain is sometimes described as stabbing, shooting, or burning. You may also have:  Tingling or numbness.  Sleep problems and fatigue.  Problems with attention and concentration (fibro fog). Other symptoms may include:  Bowel and bladder problems.  Headaches.  Visual problems.  Problems with odors and noises.  Depression or mood changes.  Painful menstrual periods (dysmenorrhea).  Dry skin or  eyes. These symptoms can vary over time. Myofascial pain syndrome Symptoms of myofascial pain syndrome include:  Tight, ropy bands of muscle.  Uncomfortable sensations in muscle areas. These may include aching, cramping, burning, numbness, tingling, and weakness.  Difficulty moving certain parts of the body freely (poor range of motion). How is this diagnosed? This condition may be diagnosed by your symptoms and medical history. You will also have a physical exam. In general:  Fibromyalgia is diagnosed if you have pain, fatigue, and other symptoms for more than 3 months, and symptoms cannot be explained by another condition.  Myofascial pain syndrome is diagnosed if you have trigger points in your muscles, and those trigger points are tender and cause pain elsewhere in your body (referred pain). How is this treated? Treatment for these conditions depends on the type that you have.  For fibromyalgia: ? Pain medicines, such as NSAIDs. ? Medicines for treating depression. ? Medicines for treating seizures. ? Medicines that relax the muscles.  For myofascial pain: ? Pain medicines, such as NSAIDs. ? Cooling and stretching of muscles. ? Trigger point injections. ? Sound wave (ultrasound) treatments to stimulate muscles. Treating these conditions often requires a team of health care providers. These may include:  Your primary care provider.  Physical therapist.  Complementary health care providers, such as massage therapists or acupuncturists.  Psychiatrist for cognitive behavioral therapy. Follow these instructions at home: Medicines  Take over-the-counter and prescription medicines only as told by your health care provider.  Do not drive or use heavy machinery while taking prescription pain medicine.  If you are taking prescription pain medicine, take actions to prevent or treat constipation. Your health care   provider may recommend that you: ? Drink enough fluid to keep  your urine pale yellow. ? Eat foods that are high in fiber, such as fresh fruits and vegetables, whole grains, and beans. ? Limit foods that are high in fat and processed sugars, such as fried or sweet foods. ? Take an over-the-counter or prescription medicine for constipation. Lifestyle   Exercise as directed by your health care provider or physical therapist.  Practice relaxation techniques to control your stress. You may want to try: ? Biofeedback. ? Visual imagery. ? Hypnosis. ? Muscle relaxation. ? Yoga. ? Meditation.  Maintain a healthy lifestyle. This includes eating a healthy diet and getting enough sleep.  Do not use any products that contain nicotine or tobacco, such as cigarettes and e-cigarettes. If you need help quitting, ask your health care provider. General instructions  Talk to your health care provider about complementary treatments, such as acupuncture or massage.  Consider joining a support group with others who are diagnosed with this condition.  Do not do activities that stress or strain your muscles. This includes repetitive motions and heavy lifting.  Keep all follow-up visits as told by your health care provider. This is important. Where to find more information  National Fibromyalgia Association: www.fmaware.org  Arthritis Foundation: www.arthritis.org  American Chronic Pain Association: www.theacpa.org Contact a health care provider if:  You have new symptoms.  Your symptoms get worse or your pain is severe.  You have side effects from your medicines.  You have trouble sleeping.  Your condition is causing depression or anxiety. Summary  Myofascial pain syndrome and fibromyalgia are pain disorders.  Myofascial pain syndrome has tender points in the muscle that will cause pain when pressed (trigger points). Fibromyalgia also has muscle pains and tenderness that come and go, but this condition is often associated with fatigue and sleep  disturbances.  Fibromyalgia and myofascial pain syndrome are not the same but often occur together, causing pain and fatigue that make day-to-day activities difficult.  Treatment for fibromyalgia includes taking medicines to relax the muscles and medicines for pain, depression, or seizures. Treatment for myofascial pain syndrome includes taking medicines for pain, cooling and stretching of muscles, and injecting medicines into trigger points.  Follow your health care provider's instructions for taking medicines and maintaining a healthy lifestyle. This information is not intended to replace advice given to you by your health care provider. Make sure you discuss any questions you have with your health care provider. Document Released: 05/31/2005 Document Revised: 09/22/2018 Document Reviewed: 06/15/2017 Elsevier Patient Education  2020 Elsevier Inc.  

## 2019-04-26 NOTE — Assessment & Plan Note (Signed)
Stable Refill pain meds  

## 2019-04-26 NOTE — Assessment & Plan Note (Signed)
Well controlled, no changes to meds. Encouraged heart healthy diet such as the DASH diet and exercise as tolerated.  °

## 2019-04-26 NOTE — Assessment & Plan Note (Signed)
Tolerating statin, encouraged heart healthy diet, avoid trans fats, minimize simple carbs and saturated fats. Increase exercise as tolerated 

## 2019-05-02 LAB — PAIN MGMT, PROFILE 8 W/CONF, U
6 Acetylmorphine: NEGATIVE ng/mL
Alcohol Metabolites: NEGATIVE ng/mL (ref <500)
Amphetamines: NEGATIVE ng/mL
Benzodiazepines: NEGATIVE ng/mL
Buprenorphine, Urine: NEGATIVE ng/mL
Cocaine Metabolite: NEGATIVE ng/mL
Codeine: NEGATIVE ng/mL
Creatinine: 166.5 mg/dL
Hydrocodone: NEGATIVE ng/mL
Hydromorphone: NEGATIVE ng/mL
MDMA: NEGATIVE ng/mL
Marijuana Metabolite: NEGATIVE ng/mL
Morphine: NEGATIVE ng/mL
Norhydrocodone: 108 ng/mL
Opiates: POSITIVE ng/mL
Oxidant: NEGATIVE ug/mL
Oxycodone: NEGATIVE ng/mL
pH: 5.7 (ref 4.5–9.0)

## 2019-05-29 ENCOUNTER — Other Ambulatory Visit: Payer: Self-pay | Admitting: Family Medicine

## 2019-05-29 DIAGNOSIS — G894 Chronic pain syndrome: Secondary | ICD-10-CM

## 2019-05-29 MED ORDER — TRAMADOL HCL 50 MG PO TABS: ORAL_TABLET | ORAL | 0 refills | Status: DC

## 2019-05-29 NOTE — Telephone Encounter (Signed)
Last Tramadol RX:  04/09/19, # 90 Last OV:  04/26/19 Next OV:  07/27/19 UDS: 04/26/19 CSC:  04/26/19

## 2019-06-20 DIAGNOSIS — M9902 Segmental and somatic dysfunction of thoracic region: Secondary | ICD-10-CM | POA: Diagnosis not present

## 2019-06-20 DIAGNOSIS — M9905 Segmental and somatic dysfunction of pelvic region: Secondary | ICD-10-CM | POA: Diagnosis not present

## 2019-06-20 DIAGNOSIS — M5431 Sciatica, right side: Secondary | ICD-10-CM | POA: Diagnosis not present

## 2019-06-20 DIAGNOSIS — M5136 Other intervertebral disc degeneration, lumbar region: Secondary | ICD-10-CM | POA: Diagnosis not present

## 2019-06-20 DIAGNOSIS — M9903 Segmental and somatic dysfunction of lumbar region: Secondary | ICD-10-CM | POA: Diagnosis not present

## 2019-06-20 DIAGNOSIS — M5134 Other intervertebral disc degeneration, thoracic region: Secondary | ICD-10-CM | POA: Diagnosis not present

## 2019-06-27 DIAGNOSIS — M9903 Segmental and somatic dysfunction of lumbar region: Secondary | ICD-10-CM | POA: Diagnosis not present

## 2019-06-27 DIAGNOSIS — M5431 Sciatica, right side: Secondary | ICD-10-CM | POA: Diagnosis not present

## 2019-06-27 DIAGNOSIS — M5134 Other intervertebral disc degeneration, thoracic region: Secondary | ICD-10-CM | POA: Diagnosis not present

## 2019-06-27 DIAGNOSIS — M9905 Segmental and somatic dysfunction of pelvic region: Secondary | ICD-10-CM | POA: Diagnosis not present

## 2019-06-27 DIAGNOSIS — M5136 Other intervertebral disc degeneration, lumbar region: Secondary | ICD-10-CM | POA: Diagnosis not present

## 2019-06-27 DIAGNOSIS — M9902 Segmental and somatic dysfunction of thoracic region: Secondary | ICD-10-CM | POA: Diagnosis not present

## 2019-07-04 DIAGNOSIS — M9903 Segmental and somatic dysfunction of lumbar region: Secondary | ICD-10-CM | POA: Diagnosis not present

## 2019-07-04 DIAGNOSIS — M9902 Segmental and somatic dysfunction of thoracic region: Secondary | ICD-10-CM | POA: Diagnosis not present

## 2019-07-04 DIAGNOSIS — M9905 Segmental and somatic dysfunction of pelvic region: Secondary | ICD-10-CM | POA: Diagnosis not present

## 2019-07-04 DIAGNOSIS — M5136 Other intervertebral disc degeneration, lumbar region: Secondary | ICD-10-CM | POA: Diagnosis not present

## 2019-07-04 DIAGNOSIS — M5134 Other intervertebral disc degeneration, thoracic region: Secondary | ICD-10-CM | POA: Diagnosis not present

## 2019-07-04 DIAGNOSIS — M5431 Sciatica, right side: Secondary | ICD-10-CM | POA: Diagnosis not present

## 2019-07-11 DIAGNOSIS — M9902 Segmental and somatic dysfunction of thoracic region: Secondary | ICD-10-CM | POA: Diagnosis not present

## 2019-07-11 DIAGNOSIS — M5134 Other intervertebral disc degeneration, thoracic region: Secondary | ICD-10-CM | POA: Diagnosis not present

## 2019-07-11 DIAGNOSIS — M9905 Segmental and somatic dysfunction of pelvic region: Secondary | ICD-10-CM | POA: Diagnosis not present

## 2019-07-11 DIAGNOSIS — M9903 Segmental and somatic dysfunction of lumbar region: Secondary | ICD-10-CM | POA: Diagnosis not present

## 2019-07-11 DIAGNOSIS — M5136 Other intervertebral disc degeneration, lumbar region: Secondary | ICD-10-CM | POA: Diagnosis not present

## 2019-07-11 DIAGNOSIS — M5431 Sciatica, right side: Secondary | ICD-10-CM | POA: Diagnosis not present

## 2019-07-12 ENCOUNTER — Other Ambulatory Visit: Payer: Self-pay | Admitting: Family Medicine

## 2019-07-12 ENCOUNTER — Telehealth: Payer: Self-pay | Admitting: *Deleted

## 2019-07-12 DIAGNOSIS — G894 Chronic pain syndrome: Secondary | ICD-10-CM

## 2019-07-12 MED ORDER — TRAMADOL HCL 50 MG PO TABS: ORAL_TABLET | ORAL | 0 refills | Status: DC

## 2019-07-12 NOTE — Telephone Encounter (Signed)
[facility] requesting refill on tramadol  Last written: 05/29/19 Last ov: 04/26/19 Next ov: 07/27/19 Contract: 04/26/19 UDS: 10/31/19

## 2019-07-12 NOTE — Telephone Encounter (Signed)
done

## 2019-07-23 DIAGNOSIS — M9905 Segmental and somatic dysfunction of pelvic region: Secondary | ICD-10-CM | POA: Diagnosis not present

## 2019-07-23 DIAGNOSIS — M9903 Segmental and somatic dysfunction of lumbar region: Secondary | ICD-10-CM | POA: Diagnosis not present

## 2019-07-23 DIAGNOSIS — M9902 Segmental and somatic dysfunction of thoracic region: Secondary | ICD-10-CM | POA: Diagnosis not present

## 2019-07-23 DIAGNOSIS — M5136 Other intervertebral disc degeneration, lumbar region: Secondary | ICD-10-CM | POA: Diagnosis not present

## 2019-07-23 DIAGNOSIS — M5134 Other intervertebral disc degeneration, thoracic region: Secondary | ICD-10-CM | POA: Diagnosis not present

## 2019-07-23 DIAGNOSIS — M5431 Sciatica, right side: Secondary | ICD-10-CM | POA: Diagnosis not present

## 2019-07-26 ENCOUNTER — Other Ambulatory Visit: Payer: Self-pay

## 2019-07-27 ENCOUNTER — Other Ambulatory Visit: Payer: Self-pay

## 2019-07-27 ENCOUNTER — Ambulatory Visit: Payer: Medicare PPO | Admitting: Family Medicine

## 2019-07-27 ENCOUNTER — Encounter: Payer: Self-pay | Admitting: Family Medicine

## 2019-07-27 VITALS — BP 130/84 | HR 84 | Temp 97.0°F | Resp 18 | Ht 69.0 in | Wt 252.5 lb

## 2019-07-27 DIAGNOSIS — E785 Hyperlipidemia, unspecified: Secondary | ICD-10-CM

## 2019-07-27 DIAGNOSIS — G894 Chronic pain syndrome: Secondary | ICD-10-CM | POA: Diagnosis not present

## 2019-07-27 DIAGNOSIS — I10 Essential (primary) hypertension: Secondary | ICD-10-CM | POA: Diagnosis not present

## 2019-07-27 MED ORDER — TRAMADOL HCL 50 MG PO TABS: ORAL_TABLET | ORAL | 0 refills | Status: DC

## 2019-07-27 NOTE — Progress Notes (Signed)
Patient ID: Veronica Solomon, female    DOB: February 05, 1956  Age: 64 y.o. MRN: [id number]    Subjective:  Subjective  HPI Veronica Solomon presents for f/u pain meds, htn and cholesterol    No complaints She is doing well   Review of Systems  Constitutional: Negative for fever.  HENT: Negative for congestion.   Respiratory: Negative for shortness of breath.   Cardiovascular: Negative for chest pain, palpitations and leg swelling.  Gastrointestinal: Negative for abdominal pain, blood in stool and nausea.  Genitourinary: Negative for dysuria and frequency.  Skin: Negative for rash.  Allergic/Immunologic: Negative for environmental allergies.  Neurological: Negative for dizziness and headaches.  Psychiatric/Behavioral: The patient is not nervous/anxious.     History Past Medical History:  Diagnosis Date  . Adenomatous colon polyp   . Allergy   . C. difficile colitis   . Diverticulosis   . Hyperlipidemia   . Hypertension   . IBS (irritable bowel syndrome)   . Neuromuscular disorder [facility])     She has a past surgical history that includes Abdominal hysterectomy (2002).   Her family history includes Breast cancer in her sister; Depression in her mother; Diabetes in her maternal grandmother; Heart attack in her mother; Hypertension in her mother and paternal grandmother; Uterine cancer in her mother.She reports that she quit smoking about 7 years ago. Her smoking use included cigarettes. She has a 42.00 pack-year smoking history. She has quit using smokeless tobacco. She reports current alcohol use. She reports that she does not use drugs.  Current Outpatient Medications on File Prior to Visit  Medication Sig Dispense Refill  . amLODipine (NORVASC) 5 MG tablet TAKE 1 TABLET(5 MG) BY MOUTH DAILY 90 tablet 1  . atorvastatin (LIPITOR) 20 MG tablet Take 1 tablet (20 mg total) by mouth daily. 90 tablet 1  . celecoxib (CELEBREX) 200 MG capsule Take 1 capsule (200 mg total) by mouth 2  (two) times daily as needed for moderate pain. 180 capsule 0  . DULoxetine (CYMBALTA) 60 MG capsule TAKE 2 CAPSULES BY MOUTH EVERY DAY 180 capsule 1  . HYDROcodone-acetaminophen (NORCO) 10-325 MG tablet Take 1 tablet by mouth every 8 (eight) hours as needed. 30 tablet 0  . pregabalin (LYRICA) 75 MG capsule TAKE 1 CAPSULE(75 MG) BY MOUTH TWICE DAILY 180 capsule 1   No current facility-administered medications on file prior to visit.     Objective:  Objective  Physical Exam Vitals and nursing note reviewed.  Constitutional:      Appearance: She is well-developed.  HENT:     Head: Normocephalic and atraumatic.  Eyes:     Conjunctiva/sclera: Conjunctivae normal.  Neck:     Thyroid: No thyromegaly.     Vascular: No carotid bruit or JVD.  Cardiovascular:     Rate and Rhythm: Normal rate and regular rhythm.     Heart sounds: Normal heart sounds. No murmur.  Pulmonary:     Effort: Pulmonary effort is normal. No respiratory distress.     Breath sounds: Normal breath sounds. No wheezing or rales.  Chest:     Chest wall: No tenderness.  Musculoskeletal:     Cervical back: Normal range of motion and neck supple.  Neurological:     Mental Status: She is alert and oriented to person, place, and time.    BP 130/84 (BP Location: Right Arm, Patient Position: Sitting, Cuff Size: Large)   Pulse 84   Temp (!) 97 F (36.1 C) (Temporal)   Resp  18   Ht 5\' 9"  (1.753 m)   Wt 252 lb 8 oz (114.5 kg)   SpO2 98%   BMI 37.29 kg/m  Wt Readings from Last 3 Encounters:  07/27/19 252 lb 8 oz (114.5 kg)  04/26/19 246 lb 9.6 oz (111.9 kg)  12/29/18 244 lb 9.6 oz (110.9 kg)     Lab Results  Component Value Date   WBC 8.9 07/03/2015   HGB 13.8 07/03/2015   HCT 42.2 07/03/2015   PLT 262.0 07/03/2015   GLUCOSE 98 07/27/2019   CHOL 165 07/27/2019   TRIG 83 07/27/2019   HDL 63 07/27/2019   LDLDIRECT 195.7 01/12/2010   LDLCALC 85 07/27/2019   ALT 18 07/27/2019   AST 18 07/27/2019   NA 141  07/27/2019   K 5.3 07/27/2019   CL 103 07/27/2019   CREATININE 0.77 07/27/2019   BUN 16 07/27/2019   CO2 29 07/27/2019   TSH 1.13 01/26/2018   HGBA1C 5.8 12/30/2014   MICROALBUR 4.1 (H) 11/24/2012    MM 3D SCREEN BREAST BILATERAL  Result Date: 03/21/2019 CLINICAL DATA:  Screening. EXAM: DIGITAL SCREENING BILATERAL MAMMOGRAM WITH TOMO AND CAD COMPARISON:  Previous exam(s). ACR Breast Density Category b: There are scattered areas of fibroglandular density. FINDINGS: There are no findings suspicious for malignancy. Images were processed with CAD. IMPRESSION: No mammographic evidence of malignancy. A result letter of this screening mammogram will be mailed directly to the patient. RECOMMENDATION: Screening mammogram in one year. (Code:SM-B-01Y) BI-RADS CATEGORY  1: Negative. Electronically Signed   By: Veronica Solomon M.D.   On: 03/21/2019 08:09     Assessment & Plan:  Plan  I have discontinued Veronica Solomon. Veronica Solomon's cetirizine, naphazoline-pheniramine, and fluticasone. I am also having her maintain her HYDROcodone-acetaminophen, celecoxib, pregabalin, DULoxetine, atorvastatin, amLODipine, and traMADol.  Meds ordered this encounter  Medications  . traMADol (ULTRAM) 50 MG tablet    Sig: 1 po q6h prn    Dispense:  90 tablet    Refill:  0    Problem List Items Addressed This Visit      Unprioritized   Chronic pain syndrome    Database reviewed Uds/ contract utd con't meds       Relevant Medications   traMADol (ULTRAM) 50 MG tablet   Essential hypertension - Primary    Well controlled, no changes to meds. Encouraged heart healthy diet such as the DASH diet and exercise as tolerated.       Relevant Orders   Lipid panel (Completed)   Comprehensive metabolic panel (Completed)   Hyperlipidemia    Encouraged heart healthy diet, increase exercise, avoid trans fats, consider a krill oil cap daily      Relevant Orders   Lipid panel (Completed)   Comprehensive metabolic panel (Completed)       Follow-up: Return in about 3 months (around 10/24/2019), or if symptoms worsen or fail to improve.  [name], DO

## 2019-07-27 NOTE — Patient Instructions (Signed)
COVID-19 Vaccine Information can be found at: [email or web address] For questions related to vaccine distribution or appointments, please email [email or web address] or call [phone].    DASH Eating Plan DASH stands for "Dietary Approaches to Stop Hypertension." The DASH eating plan is a healthy eating plan that has been shown to reduce high blood pressure (hypertension). It may also reduce your risk for type 2 diabetes, heart disease, and stroke. The DASH eating plan may also help with weight loss. What are tips for following this plan?  General guidelines  Avoid eating more than 2,300 mg (milligrams) of salt (sodium) a day. If you have hypertension, you may need to reduce your sodium intake to 1,500 mg a day.  Limit alcohol intake to no more than 1 drink a day for nonpregnant women and 2 drinks a day for men. One drink equals 12 oz of beer, 5 oz of wine, or 1 oz of hard liquor.  Work with your health care provider to maintain a healthy body weight or to lose weight. Ask what an ideal weight is for you.  Get at least 30 minutes of exercise that causes your heart to beat faster (aerobic exercise) most days of the week. Activities may include walking, swimming, or biking.  Work with your health care provider or diet and nutrition specialist (dietitian) to adjust your eating plan to your individual calorie needs. Reading food labels   Check food labels for the amount of sodium per serving. Choose foods with less than 5 percent of the Daily Value of sodium. Generally, foods with less than 300 mg of sodium per serving fit into this eating plan.  To find whole grains, look for the word "whole" as the first word in the ingredient list. Shopping  Buy products labeled as "low-sodium" or "no salt added."  Buy fresh foods. Avoid canned foods and premade or frozen meals. Cooking  Avoid adding salt when cooking. Use salt-free seasonings  or herbs instead of table salt or sea salt. Check with your health care provider or pharmacist before using salt substitutes.  Do not fry foods. Cook foods using healthy methods such as baking, boiling, grilling, and broiling instead.  Cook with heart-healthy oils, such as olive, canola, soybean, or sunflower oil. Meal planning  Eat a balanced diet that includes: ? 5 or more servings of fruits and vegetables each day. At each meal, try to fill half of your plate with fruits and vegetables. ? Up to 6-8 servings of whole grains each day. ? Less than 6 oz of lean meat, poultry, or fish each day. A 3-oz serving of meat is about the same size as a deck of cards. One egg equals 1 oz. ? 2 servings of low-fat dairy each day. ? A serving of nuts, seeds, or beans 5 times each week. ? Heart-healthy fats. Healthy fats called Omega-3 fatty acids are found in foods such as flaxseeds and coldwater fish, like sardines, salmon, and mackerel.  Limit how much you eat of the following: ? Canned or prepackaged foods. ? Food that is high in trans fat, such as fried foods. ? Food that is high in saturated fat, such as fatty meat. ? Sweets, desserts, sugary drinks, and other foods with added sugar. ? Full-fat dairy products.  Do not salt foods before eating.  Try to eat at least 2 vegetarian meals each week.  Eat more home-cooked food and less restaurant, buffet, and fast food.  When eating at a restaurant, ask that your food be prepared with  less salt or no salt, if possible. What foods are recommended? The items listed may not be a complete list. Talk with your dietitian about what dietary choices are best for you. Grains Whole-grain or whole-wheat bread. Whole-grain or whole-wheat pasta. Brown rice. [place]. Bulgur. Whole-grain and low-sodium cereals. Pita bread. Low-fat, low-sodium crackers. Whole-wheat flour tortillas. Vegetables Fresh or frozen vegetables (raw, steamed, roasted, or grilled).  Low-sodium or reduced-sodium tomato and vegetable juice. Low-sodium or reduced-sodium tomato sauce and tomato paste. Low-sodium or reduced-sodium canned vegetables. Fruits All fresh, dried, or frozen fruit. Canned fruit in natural juice (without added sugar). Meat and other protein foods Skinless chicken or [place]. Ground chicken or [place]. Pork with fat trimmed off. Fish and seafood. Egg whites. Dried beans, peas, or lentils. Unsalted nuts, nut butters, and seeds. Unsalted canned beans. Lean cuts of beef with fat trimmed off. Low-sodium, lean deli meat. Dairy Low-fat (1%) or fat-free (skim) milk. Fat-free, low-fat, or reduced-fat cheeses. Nonfat, low-sodium ricotta or cottage cheese. Low-fat or nonfat yogurt. Low-fat, low-sodium cheese. Fats and oils Soft margarine without trans fats. Vegetable oil. Low-fat, reduced-fat, or light mayonnaise and salad dressings (reduced-sodium). Canola, safflower, olive, soybean, and sunflower oils. Avocado. Seasoning and other foods Herbs. Spices. Seasoning mixes without salt. Unsalted popcorn and pretzels. Fat-free sweets. What foods are not recommended? The items listed may not be a complete list. Talk with your dietitian about what dietary choices are best for you. Grains Baked goods made with fat, such as croissants, muffins, or some breads. Dry pasta or rice meal packs. Vegetables Creamed or fried vegetables. Vegetables in a cheese sauce. Regular canned vegetables (not low-sodium or reduced-sodium). Regular canned tomato sauce and paste (not low-sodium or reduced-sodium). Regular tomato and vegetable juice (not low-sodium or reduced-sodium). Angie Fava. Olives. Fruits Canned fruit in a light or heavy syrup. Fried fruit. Fruit in cream or butter sauce. Meat and other protein foods Fatty cuts of meat. Ribs. Fried meat. Berniece Salines. Sausage. Bologna and other processed lunch meats. Salami. Fatback. Hotdogs. Bratwurst. Salted nuts and seeds. Canned beans with added  salt. Canned or smoked fish. Whole eggs or egg yolks. Chicken or [place] with skin. Dairy Whole or 2% milk, cream, and half-and-half. Whole or full-fat cream cheese. Whole-fat or sweetened yogurt. Full-fat cheese. Nondairy creamers. Whipped toppings. Processed cheese and cheese spreads. Fats and oils Butter. Stick margarine. Lard. Shortening. Ghee. Bacon fat. Tropical oils, such as coconut, palm kernel, or palm oil. Seasoning and other foods Salted popcorn and pretzels. Onion salt, garlic salt, seasoned salt, table salt, and sea salt. Worcestershire sauce. Tartar sauce. Barbecue sauce. Teriyaki sauce. Soy sauce, including reduced-sodium. Steak sauce. Canned and packaged gravies. Fish sauce. Oyster sauce. Cocktail sauce. Horseradish that you find on the shelf. Ketchup. Mustard. Meat flavorings and tenderizers. Bouillon cubes. Hot sauce and Tabasco sauce. Premade or packaged marinades. Premade or packaged taco seasonings. Relishes. Regular salad dressings. Where to find more information:  National Heart, Lung, and [place]: https://wilson-eaton.com/  American Heart Association: www.heart.org Summary  The DASH eating plan is a healthy eating plan that has been shown to reduce high blood pressure (hypertension). It may also reduce your risk for type 2 diabetes, heart disease, and stroke.  With the DASH eating plan, you should limit salt (sodium) intake to 2,300 mg a day. If you have hypertension, you may need to reduce your sodium intake to 1,500 mg a day.  When on the DASH eating plan, aim to eat more fresh fruits and vegetables, whole grains, lean proteins,  low-fat dairy, and heart-healthy fats.  Work with your health care provider or diet and nutrition specialist (dietitian) to adjust your eating plan to your individual calorie needs. This information is not intended to replace advice given to you by your health care provider. Make sure you discuss any questions you have with your health care  provider. Document Revised: 05/13/2017 Document Reviewed: 05/24/2016 Elsevier Patient Education  2020 [employer].

## 2019-07-28 LAB — COMPREHENSIVE METABOLIC PANEL
AG Ratio: 1.9 (calc) (ref 1.0–2.5)
ALT: 18 U/L (ref 6–29)
AST: 18 U/L (ref 10–35)
Albumin: 4.5 g/dL (ref 3.6–5.1)
Alkaline phosphatase (APISO): 110 U/L (ref 37–153)
BUN: 16 mg/dL (ref 7–25)
CO2: 29 mmol/L (ref 20–32)
Calcium: 10.1 mg/dL (ref 8.6–10.4)
Chloride: 103 mmol/L (ref 98–110)
Creat: 0.77 mg/dL (ref 0.50–0.99)
Globulin: 2.4 g/dL (calc) (ref 1.9–3.7)
Glucose, Bld: 98 mg/dL (ref 65–99)
Potassium: 5.3 mmol/L (ref 3.5–5.3)
Sodium: 141 mmol/L (ref 135–146)
Total Bilirubin: 0.5 mg/dL (ref 0.2–1.2)
Total Protein: 6.9 g/dL (ref 6.1–8.1)

## 2019-07-28 LAB — LIPID PANEL
Cholesterol: 165 mg/dL (ref <200)
HDL: 63 mg/dL (ref >=50)
LDL Cholesterol (Calc): 85 mg/dL (calc)
Non-HDL Cholesterol (Calc): 102 mg/dL (calc) (ref <130)
Total CHOL/HDL Ratio: 2.6 (calc) (ref <5.0)
Triglycerides: 83 mg/dL (ref <150)

## 2019-07-29 NOTE — Assessment & Plan Note (Signed)
Well controlled, no changes to meds. Encouraged heart healthy diet such as the DASH diet and exercise as tolerated.  °

## 2019-07-29 NOTE — Assessment & Plan Note (Signed)
Encouraged heart healthy diet, increase exercise, avoid trans fats, consider a krill oil cap daily 

## 2019-07-29 NOTE — Assessment & Plan Note (Signed)
Database reviewed Uds/ contract utd con't meds

## 2019-07-30 NOTE — Telephone Encounter (Signed)
It can be video

## 2019-08-06 DIAGNOSIS — M9902 Segmental and somatic dysfunction of thoracic region: Secondary | ICD-10-CM | POA: Diagnosis not present

## 2019-08-06 DIAGNOSIS — M5134 Other intervertebral disc degeneration, thoracic region: Secondary | ICD-10-CM | POA: Diagnosis not present

## 2019-08-06 DIAGNOSIS — M9905 Segmental and somatic dysfunction of pelvic region: Secondary | ICD-10-CM | POA: Diagnosis not present

## 2019-08-06 DIAGNOSIS — M5431 Sciatica, right side: Secondary | ICD-10-CM | POA: Diagnosis not present

## 2019-08-06 DIAGNOSIS — M9903 Segmental and somatic dysfunction of lumbar region: Secondary | ICD-10-CM | POA: Diagnosis not present

## 2019-08-06 DIAGNOSIS — M5136 Other intervertebral disc degeneration, lumbar region: Secondary | ICD-10-CM | POA: Diagnosis not present

## 2019-08-16 ENCOUNTER — Other Ambulatory Visit: Payer: Self-pay | Admitting: Family Medicine

## 2019-08-16 ENCOUNTER — Telehealth: Payer: Self-pay | Admitting: *Deleted

## 2019-08-16 DIAGNOSIS — G894 Chronic pain syndrome: Secondary | ICD-10-CM

## 2019-08-16 MED ORDER — PREGABALIN 75 MG PO CAPS: ORAL_CAPSULE | ORAL | 1 refills | Status: DC

## 2019-08-16 NOTE — Telephone Encounter (Signed)
[facility] requesting refill for lyrica  Last written: 02/14/19 Last ov: 07/27/19 Next ov: 10/25/19 Contract: 04/26/19 UDS: 04/26/19

## 2019-08-20 DIAGNOSIS — M5431 Sciatica, right side: Secondary | ICD-10-CM | POA: Diagnosis not present

## 2019-08-20 DIAGNOSIS — M9903 Segmental and somatic dysfunction of lumbar region: Secondary | ICD-10-CM | POA: Diagnosis not present

## 2019-08-20 DIAGNOSIS — M5134 Other intervertebral disc degeneration, thoracic region: Secondary | ICD-10-CM | POA: Diagnosis not present

## 2019-08-20 DIAGNOSIS — M5136 Other intervertebral disc degeneration, lumbar region: Secondary | ICD-10-CM | POA: Diagnosis not present

## 2019-08-20 DIAGNOSIS — M9905 Segmental and somatic dysfunction of pelvic region: Secondary | ICD-10-CM | POA: Diagnosis not present

## 2019-08-20 DIAGNOSIS — M9902 Segmental and somatic dysfunction of thoracic region: Secondary | ICD-10-CM | POA: Diagnosis not present

## 2019-09-03 DIAGNOSIS — M9902 Segmental and somatic dysfunction of thoracic region: Secondary | ICD-10-CM | POA: Diagnosis not present

## 2019-09-03 DIAGNOSIS — M5134 Other intervertebral disc degeneration, thoracic region: Secondary | ICD-10-CM | POA: Diagnosis not present

## 2019-09-03 DIAGNOSIS — M9905 Segmental and somatic dysfunction of pelvic region: Secondary | ICD-10-CM | POA: Diagnosis not present

## 2019-09-03 DIAGNOSIS — M5431 Sciatica, right side: Secondary | ICD-10-CM | POA: Diagnosis not present

## 2019-09-03 DIAGNOSIS — M5136 Other intervertebral disc degeneration, lumbar region: Secondary | ICD-10-CM | POA: Diagnosis not present

## 2019-09-03 DIAGNOSIS — M9903 Segmental and somatic dysfunction of lumbar region: Secondary | ICD-10-CM | POA: Diagnosis not present

## 2019-09-09 ENCOUNTER — Other Ambulatory Visit: Payer: Self-pay | Admitting: Family Medicine

## 2019-09-16 ENCOUNTER — Encounter: Payer: Self-pay | Admitting: Family Medicine

## 2019-09-17 MED ORDER — ATORVASTATIN CALCIUM 20 MG PO TABS: 20.0000 mg | ORAL_TABLET | Freq: Every day | ORAL | 1 refills | Status: DC

## 2019-09-17 MED ORDER — AMLODIPINE BESYLATE 5 MG PO TABS: ORAL_TABLET | ORAL | 1 refills | Status: DC

## 2019-09-26 DIAGNOSIS — M9905 Segmental and somatic dysfunction of pelvic region: Secondary | ICD-10-CM | POA: Diagnosis not present

## 2019-09-26 DIAGNOSIS — M5136 Other intervertebral disc degeneration, lumbar region: Secondary | ICD-10-CM | POA: Diagnosis not present

## 2019-09-26 DIAGNOSIS — M9902 Segmental and somatic dysfunction of thoracic region: Secondary | ICD-10-CM | POA: Diagnosis not present

## 2019-09-26 DIAGNOSIS — M9903 Segmental and somatic dysfunction of lumbar region: Secondary | ICD-10-CM | POA: Diagnosis not present

## 2019-09-26 DIAGNOSIS — M5431 Sciatica, right side: Secondary | ICD-10-CM | POA: Diagnosis not present

## 2019-09-26 DIAGNOSIS — M5134 Other intervertebral disc degeneration, thoracic region: Secondary | ICD-10-CM | POA: Diagnosis not present

## 2019-10-10 DIAGNOSIS — M5431 Sciatica, right side: Secondary | ICD-10-CM | POA: Diagnosis not present

## 2019-10-10 DIAGNOSIS — M9903 Segmental and somatic dysfunction of lumbar region: Secondary | ICD-10-CM | POA: Diagnosis not present

## 2019-10-10 DIAGNOSIS — M9905 Segmental and somatic dysfunction of pelvic region: Secondary | ICD-10-CM | POA: Diagnosis not present

## 2019-10-10 DIAGNOSIS — M5134 Other intervertebral disc degeneration, thoracic region: Secondary | ICD-10-CM | POA: Diagnosis not present

## 2019-10-10 DIAGNOSIS — M9902 Segmental and somatic dysfunction of thoracic region: Secondary | ICD-10-CM | POA: Diagnosis not present

## 2019-10-10 DIAGNOSIS — M5136 Other intervertebral disc degeneration, lumbar region: Secondary | ICD-10-CM | POA: Diagnosis not present

## 2019-10-16 ENCOUNTER — Telehealth: Payer: Self-pay | Admitting: Family Medicine

## 2019-10-16 DIAGNOSIS — G894 Chronic pain syndrome: Secondary | ICD-10-CM

## 2019-10-17 MED ORDER — TRAMADOL HCL 50 MG PO TABS: ORAL_TABLET | ORAL | 0 refills | Status: DC

## 2019-10-17 NOTE — Telephone Encounter (Signed)
PDMP okay, medication typically refilled by PCP, Rx sent

## 2019-10-17 NOTE — Telephone Encounter (Signed)
Requesting: Tramadol Contract: 04/26/19 UDS: 04/26/2019 Last OV: 07/27/19 Next OV: 10/25/19 Last Refill: 07/27/19, #90--0 RF Database:   Please advise

## 2019-10-24 DIAGNOSIS — M9903 Segmental and somatic dysfunction of lumbar region: Secondary | ICD-10-CM | POA: Diagnosis not present

## 2019-10-24 DIAGNOSIS — M9905 Segmental and somatic dysfunction of pelvic region: Secondary | ICD-10-CM | POA: Diagnosis not present

## 2019-10-24 DIAGNOSIS — M5431 Sciatica, right side: Secondary | ICD-10-CM | POA: Diagnosis not present

## 2019-10-24 DIAGNOSIS — M9902 Segmental and somatic dysfunction of thoracic region: Secondary | ICD-10-CM | POA: Diagnosis not present

## 2019-10-24 DIAGNOSIS — M5134 Other intervertebral disc degeneration, thoracic region: Secondary | ICD-10-CM | POA: Diagnosis not present

## 2019-10-24 DIAGNOSIS — M5136 Other intervertebral disc degeneration, lumbar region: Secondary | ICD-10-CM | POA: Diagnosis not present

## 2019-10-25 ENCOUNTER — Telehealth (INDEPENDENT_AMBULATORY_CARE_PROVIDER_SITE_OTHER): Payer: Medicare PPO | Admitting: Family Medicine

## 2019-10-25 ENCOUNTER — Other Ambulatory Visit: Payer: Self-pay

## 2019-10-25 ENCOUNTER — Encounter: Payer: Self-pay | Admitting: Family Medicine

## 2019-10-25 DIAGNOSIS — M797 Fibromyalgia: Secondary | ICD-10-CM | POA: Diagnosis not present

## 2019-10-25 NOTE — Progress Notes (Signed)
Virtual Visit via Video Note  I connected with [facility] on 10/25/19 at  2:00 PM EDT by a video enabled telemedicine application and verified that I am speaking with the correct person using two identifiers.  Location: Patient: home alone  Provider: office    I discussed the limitations of evaluation and management by telemedicine and the availability of in person appointments. The patient expressed understanding and agreed to proceed.  History of Present Illness: Pt is home f/u fibromyalgia  Observations/Objective: No vitals obtained Pt is in nad   Assessment and Plan: 1. Fibromyalgia Pt is on ultram and hydrocodone --- only uses hydrocodone 1 x a week if that  Will need new contract next visit    Follow Up Instructions:    I discussed the assessment and treatment plan with the patient. The patient was provided an opportunity to ask questions and all were answered. The patient agreed with the plan and demonstrated an understanding of the instructions.   The patient was advised to call back or seek an in-person evaluation if the symptoms worsen or if the condition fails to improve as anticipated.  I provided 25 minutes of non-face-to-face time during this encounter.   [name], DO

## 2019-11-21 DIAGNOSIS — M9902 Segmental and somatic dysfunction of thoracic region: Secondary | ICD-10-CM | POA: Diagnosis not present

## 2019-11-21 DIAGNOSIS — M9905 Segmental and somatic dysfunction of pelvic region: Secondary | ICD-10-CM | POA: Diagnosis not present

## 2019-11-21 DIAGNOSIS — M9903 Segmental and somatic dysfunction of lumbar region: Secondary | ICD-10-CM | POA: Diagnosis not present

## 2019-11-21 DIAGNOSIS — M5134 Other intervertebral disc degeneration, thoracic region: Secondary | ICD-10-CM | POA: Diagnosis not present

## 2019-11-21 DIAGNOSIS — M5136 Other intervertebral disc degeneration, lumbar region: Secondary | ICD-10-CM | POA: Diagnosis not present

## 2019-11-21 DIAGNOSIS — M5431 Sciatica, right side: Secondary | ICD-10-CM | POA: Diagnosis not present

## 2019-12-04 ENCOUNTER — Other Ambulatory Visit: Payer: Self-pay | Admitting: Internal Medicine

## 2019-12-04 DIAGNOSIS — G894 Chronic pain syndrome: Secondary | ICD-10-CM

## 2019-12-04 NOTE — Telephone Encounter (Signed)
Tramadol refill.   Last OV: 10/25/2019 Last Fill: 10/17/2019 #90 and 0RF Pt sig: 1 tab q6h prn UDS: 04/26/2019 Low risk

## 2019-12-05 DIAGNOSIS — M9902 Segmental and somatic dysfunction of thoracic region: Secondary | ICD-10-CM | POA: Diagnosis not present

## 2019-12-05 DIAGNOSIS — M9903 Segmental and somatic dysfunction of lumbar region: Secondary | ICD-10-CM | POA: Diagnosis not present

## 2019-12-05 DIAGNOSIS — M5431 Sciatica, right side: Secondary | ICD-10-CM | POA: Diagnosis not present

## 2019-12-05 DIAGNOSIS — M5136 Other intervertebral disc degeneration, lumbar region: Secondary | ICD-10-CM | POA: Diagnosis not present

## 2019-12-05 DIAGNOSIS — M9905 Segmental and somatic dysfunction of pelvic region: Secondary | ICD-10-CM | POA: Diagnosis not present

## 2019-12-05 DIAGNOSIS — M5134 Other intervertebral disc degeneration, thoracic region: Secondary | ICD-10-CM | POA: Diagnosis not present

## 2019-12-19 DIAGNOSIS — M9905 Segmental and somatic dysfunction of pelvic region: Secondary | ICD-10-CM | POA: Diagnosis not present

## 2019-12-19 DIAGNOSIS — M5431 Sciatica, right side: Secondary | ICD-10-CM | POA: Diagnosis not present

## 2019-12-19 DIAGNOSIS — M9902 Segmental and somatic dysfunction of thoracic region: Secondary | ICD-10-CM | POA: Diagnosis not present

## 2019-12-19 DIAGNOSIS — M5134 Other intervertebral disc degeneration, thoracic region: Secondary | ICD-10-CM | POA: Diagnosis not present

## 2019-12-19 DIAGNOSIS — M9903 Segmental and somatic dysfunction of lumbar region: Secondary | ICD-10-CM | POA: Diagnosis not present

## 2019-12-19 DIAGNOSIS — M5136 Other intervertebral disc degeneration, lumbar region: Secondary | ICD-10-CM | POA: Diagnosis not present

## 2020-01-02 DIAGNOSIS — M9902 Segmental and somatic dysfunction of thoracic region: Secondary | ICD-10-CM | POA: Diagnosis not present

## 2020-01-02 DIAGNOSIS — M5136 Other intervertebral disc degeneration, lumbar region: Secondary | ICD-10-CM | POA: Diagnosis not present

## 2020-01-02 DIAGNOSIS — M9903 Segmental and somatic dysfunction of lumbar region: Secondary | ICD-10-CM | POA: Diagnosis not present

## 2020-01-02 DIAGNOSIS — M9905 Segmental and somatic dysfunction of pelvic region: Secondary | ICD-10-CM | POA: Diagnosis not present

## 2020-01-02 DIAGNOSIS — M5431 Sciatica, right side: Secondary | ICD-10-CM | POA: Diagnosis not present

## 2020-01-02 DIAGNOSIS — M5134 Other intervertebral disc degeneration, thoracic region: Secondary | ICD-10-CM | POA: Diagnosis not present

## 2020-01-14 ENCOUNTER — Other Ambulatory Visit: Payer: Self-pay | Admitting: Family Medicine

## 2020-01-14 DIAGNOSIS — G894 Chronic pain syndrome: Secondary | ICD-10-CM

## 2020-01-14 MED ORDER — HYDROCODONE-ACETAMINOPHEN 10-325 MG PO TABS: 1.0000 | ORAL_TABLET | Freq: Three times a day (TID) | ORAL | 0 refills | Status: DC | PRN

## 2020-01-14 NOTE — Telephone Encounter (Signed)
Last office visit- 10-25-2019 Last refill- 12-04-2019 Next appointment- Not scheduled  UDS- 04-26-2019

## 2020-01-14 NOTE — Telephone Encounter (Signed)
Last written: 07/25/18 Last ov: 10/25/19 Next ov: none  Contract: 04/26/19 UDS: 04/26/19

## 2020-01-23 DIAGNOSIS — M9905 Segmental and somatic dysfunction of pelvic region: Secondary | ICD-10-CM | POA: Diagnosis not present

## 2020-01-23 DIAGNOSIS — M5431 Sciatica, right side: Secondary | ICD-10-CM | POA: Diagnosis not present

## 2020-01-23 DIAGNOSIS — M5134 Other intervertebral disc degeneration, thoracic region: Secondary | ICD-10-CM | POA: Diagnosis not present

## 2020-01-23 DIAGNOSIS — M5136 Other intervertebral disc degeneration, lumbar region: Secondary | ICD-10-CM | POA: Diagnosis not present

## 2020-01-23 DIAGNOSIS — M9902 Segmental and somatic dysfunction of thoracic region: Secondary | ICD-10-CM | POA: Diagnosis not present

## 2020-01-23 DIAGNOSIS — M9903 Segmental and somatic dysfunction of lumbar region: Secondary | ICD-10-CM | POA: Diagnosis not present

## 2020-01-25 ENCOUNTER — Encounter: Payer: Self-pay | Admitting: Family Medicine

## 2020-02-13 DIAGNOSIS — M5134 Other intervertebral disc degeneration, thoracic region: Secondary | ICD-10-CM | POA: Diagnosis not present

## 2020-02-13 DIAGNOSIS — M5136 Other intervertebral disc degeneration, lumbar region: Secondary | ICD-10-CM | POA: Diagnosis not present

## 2020-02-13 DIAGNOSIS — M9902 Segmental and somatic dysfunction of thoracic region: Secondary | ICD-10-CM | POA: Diagnosis not present

## 2020-02-13 DIAGNOSIS — M9903 Segmental and somatic dysfunction of lumbar region: Secondary | ICD-10-CM | POA: Diagnosis not present

## 2020-02-13 DIAGNOSIS — M9905 Segmental and somatic dysfunction of pelvic region: Secondary | ICD-10-CM | POA: Diagnosis not present

## 2020-02-13 DIAGNOSIS — M5431 Sciatica, right side: Secondary | ICD-10-CM | POA: Diagnosis not present

## 2020-02-18 ENCOUNTER — Encounter: Payer: Self-pay | Admitting: Family Medicine

## 2020-02-18 DIAGNOSIS — G894 Chronic pain syndrome: Secondary | ICD-10-CM

## 2020-02-19 MED ORDER — PREGABALIN 75 MG PO CAPS: ORAL_CAPSULE | ORAL | 0 refills | Status: DC

## 2020-03-04 ENCOUNTER — Other Ambulatory Visit: Payer: Self-pay | Admitting: Family Medicine

## 2020-03-04 ENCOUNTER — Encounter: Payer: Self-pay | Admitting: Family Medicine

## 2020-03-04 DIAGNOSIS — G894 Chronic pain syndrome: Secondary | ICD-10-CM

## 2020-03-04 MED ORDER — TRAMADOL HCL 50 MG PO TABS: 50.0000 mg | ORAL_TABLET | Freq: Four times a day (QID) | ORAL | 0 refills | Status: DC | PRN

## 2020-03-04 NOTE — Telephone Encounter (Signed)
Tramadol refill.   Last OV: 10/25/2019 Last Fill: 01/14/2020 #90 and 0RF Pt sig: 1 tab q6h prn UDS: 04/26/2019 Low risk

## 2020-03-04 NOTE — Telephone Encounter (Signed)
Refill sent.

## 2020-03-05 DIAGNOSIS — M9903 Segmental and somatic dysfunction of lumbar region: Secondary | ICD-10-CM | POA: Diagnosis not present

## 2020-03-05 DIAGNOSIS — M5136 Other intervertebral disc degeneration, lumbar region: Secondary | ICD-10-CM | POA: Diagnosis not present

## 2020-03-05 DIAGNOSIS — M5431 Sciatica, right side: Secondary | ICD-10-CM | POA: Diagnosis not present

## 2020-03-05 DIAGNOSIS — M9905 Segmental and somatic dysfunction of pelvic region: Secondary | ICD-10-CM | POA: Diagnosis not present

## 2020-03-05 DIAGNOSIS — M5134 Other intervertebral disc degeneration, thoracic region: Secondary | ICD-10-CM | POA: Diagnosis not present

## 2020-03-05 DIAGNOSIS — M9902 Segmental and somatic dysfunction of thoracic region: Secondary | ICD-10-CM | POA: Diagnosis not present

## 2020-03-07 ENCOUNTER — Other Ambulatory Visit: Payer: Self-pay | Admitting: *Deleted

## 2020-03-07 ENCOUNTER — Other Ambulatory Visit: Payer: Self-pay | Admitting: Family Medicine

## 2020-03-07 DIAGNOSIS — Z87891 Personal history of nicotine dependence: Secondary | ICD-10-CM

## 2020-03-08 ENCOUNTER — Telehealth: Payer: Medicare PPO | Admitting: Nurse Practitioner

## 2020-03-08 ENCOUNTER — Other Ambulatory Visit: Payer: Self-pay

## 2020-03-08 ENCOUNTER — Encounter (HOSPITAL_COMMUNITY): Payer: Self-pay | Admitting: Emergency Medicine

## 2020-03-08 DIAGNOSIS — I2510 Atherosclerotic heart disease of native coronary artery without angina pectoris: Secondary | ICD-10-CM | POA: Insufficient documentation

## 2020-03-08 DIAGNOSIS — J069 Acute upper respiratory infection, unspecified: Secondary | ICD-10-CM | POA: Diagnosis not present

## 2020-03-08 DIAGNOSIS — Z87891 Personal history of nicotine dependence: Secondary | ICD-10-CM | POA: Insufficient documentation

## 2020-03-08 DIAGNOSIS — I10 Essential (primary) hypertension: Secondary | ICD-10-CM | POA: Diagnosis not present

## 2020-03-08 DIAGNOSIS — R05 Cough: Secondary | ICD-10-CM | POA: Diagnosis not present

## 2020-03-08 DIAGNOSIS — Z20822 Contact with and (suspected) exposure to covid-19: Secondary | ICD-10-CM | POA: Insufficient documentation

## 2020-03-08 DIAGNOSIS — Z79899 Other long term (current) drug therapy: Secondary | ICD-10-CM | POA: Diagnosis not present

## 2020-03-08 DIAGNOSIS — R079 Chest pain, unspecified: Secondary | ICD-10-CM | POA: Diagnosis not present

## 2020-03-08 MED ORDER — NAPROXEN 500 MG PO TABS: 500.0000 mg | ORAL_TABLET | Freq: Two times a day (BID) | ORAL | 1 refills | Status: DC

## 2020-03-08 NOTE — ED Triage Notes (Signed)
Patient states that yesterday in the evening she began having body aches and fatigue and a cough.

## 2020-03-08 NOTE — Progress Notes (Signed)
E-Visit for Corona Virus Screening  Your current symptoms could be consistent with the coronavirus.  Many health care providers can now test patients at their office but not all are.  [place] has multiple testing sites. For information on our [place] testing locations and hours go to [email or web address]  We are enrolling you in our [place] for [place] . Daily you will receive a questionnaire within the [place] website. Our COVID 19 response team will be monitoring your responses daily.  Testing Information: The COVID-19 Community Testing sites are testing BY APPOINTMENT ONLY.  You can schedule online at [email or web address]  If you do not have access to a smart phone or computer you may call [phone] for an appointment.   Additional testing sites in the Community:  . For [facility] Testing sites in [facility]  FaceUpdate.[email or web address]  . For Pop-up testing sites in [place]  BowlDirectory.co.uk  . For [facility] [email or web address]  . For [facility] testing in [place] and [employer] [email or web address]  . For [facility] testing in [facility]   [email or web address]  For  more information about community testing call [phone]   Please quarantine yourself while awaiting your test results. Please stay home for a minimum of 10 days from the first day of illness with improving symptoms and you have had 24 hours of no fever (without the use of Tylenol (Acetaminophen) Motrin (Ibuprofen) or any fever reducing medication).  Also - Do not get tested prior to returning to work because once you have had a positive test the test can stay  positive for more than a month in some cases.   You should wear a mask or cloth face covering over your nose and mouth if you must be around other people or animals, including pets (even at home). Try to stay at least 6 feet away from other people. This will protect the people around you.  Please continue good preventive care measures, including:  frequent hand-washing, avoid touching your face, cover coughs/sneezes, stay out of crowds and keep a 6 foot distance from others.  COVID-19 is a respiratory illness with symptoms that are similar to the flu. Symptoms are typically mild to moderate, but there have been cases of severe illness and death due to the virus.   The following symptoms may appear 2-14 days after exposure: . Fever . Cough . Shortness of breath or difficulty breathing . Chills . Repeated shaking with chills . Muscle pain . Headache . Sore throat . New loss of taste or smell . Fatigue . Congestion or runny nose . Nausea or vomiting . Diarrhea  Go to the nearest hospital ED for assessment if fever/cough/breathlessness are severe or illness seems like a threat to life.  It is vitally important that if you feel that you have an infection such as this virus or any other virus that you stay home and away from places where you may spread it to others.  You should avoid contact with people age 55 and older.   You can use medication such as I have prescribed Naprosyn 500 mg. Take twice daily as needed for fever or body aches for 2 weeks  You may also take acetaminophen (Tylenol) as needed for fever.  Reduce your risk of any infection by using the same precautions used for avoiding the common cold or flu:  Marland Kitchen Wash your hands often with soap and warm water for at least 20 seconds.  If soap and water are not readily available,  use an alcohol-based hand sanitizer with at least 60% alcohol.  . If coughing or sneezing, cover your mouth and nose by coughing or sneezing into the elbow areas  of your shirt or coat, into a tissue or into your sleeve (not your hands). . Avoid shaking hands with others and consider head nods or verbal greetings only. . Avoid touching your eyes, nose, or mouth with unwashed hands.  . Avoid close contact with people who are sick. . Avoid places or events with large numbers of people in one location, like concerts or sporting events. . Carefully consider travel plans you have or are making. . If you are planning any travel outside or inside the [place], visit the CDC's Travelers' Health webpage for the latest health notices. . If you have some symptoms but not all symptoms, continue to monitor at home and seek medical attention if your symptoms worsen. . If you are having a medical emergency, call 911.  HOME CARE . Only take medications as instructed by your medical team. . Drink plenty of fluids and get plenty of rest. . A steam or ultrasonic humidifier can help if you have congestion.   GET HELP RIGHT AWAY IF YOU HAVE EMERGENCY WARNING SIGNS** FOR COVID-19. If you or someone is showing any of these signs seek emergency medical care immediately. Call 911 or proceed to your closest emergency facility if: . You develop worsening high fever. . Trouble breathing . Bluish lips or face . Persistent pain or pressure in the chest . New confusion . Inability to wake or stay awake . You cough up blood. . Your symptoms become more severe  **This list is not all possible symptoms. Contact your medical provider for any symptoms that are sever or concerning to you.  MAKE SURE YOU   Understand these instructions.  Will watch your condition.  Will get help right away if you are not doing well or get worse.  Your e-visit answers were reviewed by a board certified advanced clinical practitioner to complete your personal care plan.  Depending on the condition, your plan could have included both over the counter or prescription medications.  If there is a problem  please reply once you have received a response from your provider.  Your safety is important to [place].  If you have drug allergies check your prescription carefully.    You can use MyChart to ask questions about today's visit, request a non-urgent call back, or ask for a work or school excuse for 24 hours related to this e-Visit. If it has been greater than 24 hours you will need to follow up with your provider, or enter a new e-Visit to address those concerns. You will get an e-mail in the next two days asking about your experience.  I hope that your e-visit has been valuable and will speed your recovery. Thank you for using e-visits.  5-10 minutes spent reviewing and documenting in chart.

## 2020-03-09 ENCOUNTER — Emergency Department (HOSPITAL_COMMUNITY)
Admission: EM | Admit: 2020-03-09 | Discharge: 2020-03-09 | Disposition: A | Discharge status: Home / Self Care | Payer: Medicare PPO | Attending: Emergency Medicine | Admitting: Emergency Medicine

## 2020-03-09 ENCOUNTER — Emergency Department (HOSPITAL_COMMUNITY): Payer: Medicare PPO

## 2020-03-09 DIAGNOSIS — J069 Acute upper respiratory infection, unspecified: Secondary | ICD-10-CM

## 2020-03-09 DIAGNOSIS — R05 Cough: Secondary | ICD-10-CM | POA: Diagnosis not present

## 2020-03-09 LAB — RESPIRATORY PANEL BY RT PCR (FLU A&B, COVID)
Influenza A by PCR: NEGATIVE
Influenza B by PCR: NEGATIVE
SARS Coronavirus 2 by RT PCR: NEGATIVE

## 2020-03-09 NOTE — ED Provider Notes (Signed)
**Veronica [name]** Veronica Veronica Solomon   CSN: [id number] Arrival date & time: 03/08/20  2157     History Chief Complaint  Patient presents with  . Cough  . Fatigue    [facility] [name] is a 64 y.o. female.  HPI     This is a 64 year old female with a history of hypertension, hyperlipidemia, IBS, fibromyalgia who presents with myalgias, chills, cough, sore throat.  Patient reports onset of upper respiratory symptoms on Friday.  She states "I hope you will tell me I do not have Covid."  She is fully vaccinated.  She reports her granddaughter has been sick with "some sort of virus."  She reports chills without fevers.  She reports myalgias, nonproductive cough, sore throat.  She also reports some anterior chest discomfort with coughing.  She has not lost her sense of taste or smell.  Past Medical History:  Diagnosis Date  . Adenomatous colon polyp   . Allergy   . C. difficile colitis   . Diverticulosis   . Hyperlipidemia   . Hypertension   . IBS (irritable bowel syndrome)   . Neuromuscular disorder [facility])     Patient Active Problem List   Diagnosis Date Noted  . Current mild episode of major depressive disorder ([place]) 04/26/2019  . CAD (coronary artery disease) 03/07/2018  . Sensorineural hearing loss (SNHL), 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  . Tinnitus, 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  . Chronic swimmer's ear of both sides 02/22/2018  . Accessory skin tags 12/27/2016  . Chronic daily headache 09/01/2016  . Memory loss due to medical condition 06/30/2016  . Hyperlipidemia 12/30/2014  . Obesity (BMI 30-39.9) 06/25/2013  . Numerous moles 11/24/2012  . Chronic pain syndrome 09/29/2010  . COUGH 12/04/2009  . CONSTIPATION 07/18/2009  . Myalgia and myositis, unspecified 04/14/2009  . DEPRESSION 03/19/2009  . PARESTHESIA 03/19/2009  . DIARRHEA, INFECTIOUS 03/18/2009  . DIARRHEA 03/18/2009  . PERSONAL HX COLONIC POLYPS 03/18/2009  . FATIGUE 03/10/2009  . Essential  hypertension 02/28/2009  . IBS 02/14/2009  . POSTMENOPAUSAL STATUS 12/30/2008  . 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 04/01/2008  . HEADACHE 04/01/2008  . ACUTE BRONCHITIS 03/07/2008  . URI 07/10/2007    Past Surgical History:  Procedure Laterality Date  . ABDOMINAL HYSTERECTOMY  2002   fibroids     OB History   No obstetric history on file.     Family History  Problem Relation Age of Onset  . Hypertension Mother   . Depression Mother   . Heart attack Mother   . Uterine cancer Mother   . Hypertension Paternal Grandmother   . Diabetes Maternal Grandmother   . Breast cancer Sister     Social History   Tobacco Use  . Smoking status: Former Smoker    Packs/day: 1.00    Years: 42.00    Pack years: 42.00    Types: Cigarettes    Quit date: 12/29/2011    Years since quitting: 8.2  . Smokeless tobacco: Former Systems developer  . Tobacco comment: Quit 12/2011  Vaping Use  . Vaping Use: Never used  Substance Use Topics  . Alcohol use: Yes    Comment: rarely  . Drug use: No    Home Medications Prior to Admission medications   Medication Sig Start Date End Date Taking? Authorizing Provider  amLODipine (NORVASC) 5 MG tablet TAKE 1 TABLET(5 MG) BY MOUTH DAILY 03/07/20   Carollee Herter, Yvonne R, DO  atorvastatin (LIPITOR) 20 MG tablet TAKE 1 TABLET(20 MG) BY MOUTH  DAILY 03/07/20   [name], DO  celecoxib (CELEBREX) 200 MG capsule Take 1 capsule (200 mg total) by mouth 2 (two) times daily as needed for moderate pain. 02/12/19   Roma Schanz R, DO  DULoxetine (CYMBALTA) 60 MG capsule TAKE 2 CAPSULES BY MOUTH EVERY DAY 09/10/19   Carollee Herter, Alferd Apa, DO  HYDROcodone-acetaminophen (NORCO) 10-325 MG tablet Take 1 tablet by mouth every 8 (eight) hours as needed. 01/14/20   [name], DO  naproxen (NAPROSYN) 500 MG tablet Take 1 tablet (500 mg total) by mouth 2 (two) times daily with a meal. 03/08/20   Hassell Done, Mary-Margaret, FNP  pregabalin (LYRICA) 75 MG  capsule TAKE 1 CAPSULE(75 MG) BY MOUTH TWICE DAILY 02/19/20   Wendling, Crosby Oyster, DO  traMADol (ULTRAM) 50 MG tablet Take 1 tablet (50 mg total) by mouth every 6 (six) hours as needed. 03/04/20   [name], DO  traMADol (ULTRAM) 50 MG tablet Take 1 tablet (50 mg total) by mouth every 6 (six) hours as needed. 03/04/20   [name], DO    Allergies    Paregoric, Penicillins, and Procaine hcl  Review of Systems   Review of Systems  Constitutional: Positive for chills. Negative for fever.  HENT: Positive for sore throat.   Respiratory: Positive for cough. Negative for shortness of breath.   Cardiovascular: Positive for chest pain. Negative for leg swelling.  Gastrointestinal: Negative for abdominal pain, diarrhea, nausea and vomiting.  Genitourinary: Negative for dysuria.  All other systems reviewed and are negative.   Physical Exam Updated Vital Signs BP (!) 151/86 (BP Location: Left Arm)   Pulse 78   Temp 98.4 F (36.9 C) (Oral)   Resp 17   Ht 1.753 m (5\' 9" )   Wt 115.6 kg   SpO2 95%   BMI 37.63 kg/m   Physical Exam Vitals and nursing Veronica Solomon reviewed.  Constitutional:      Appearance: She is well-developed. She is obese. She is not ill-appearing.  HENT:     Head: Normocephalic and atraumatic.     Nose: Congestion present.     Mouth/Throat:     Mouth: Mucous membranes are moist.     Pharynx: No oropharyngeal exudate or posterior oropharyngeal erythema.  Eyes:     Pupils: Pupils are equal, round, and reactive to light.  Cardiovascular:     Rate and Rhythm: Normal rate and regular rhythm.     Heart sounds: Normal heart sounds.  Pulmonary:     Effort: Pulmonary effort is normal. No respiratory distress.     Breath sounds: No wheezing.  Abdominal:     Palpations: Abdomen is soft.     Tenderness: There is no abdominal tenderness.  Musculoskeletal:     Cervical back: Neck supple.     Right lower leg: No edema.     Left lower leg: No edema.   Skin:    General: Skin is warm and dry.  Neurological:     Mental Status: She is alert and oriented to person, place, and time.  Psychiatric:        Mood and Affect: Mood normal.     ED Results / Procedures / Treatments   Labs (all labs ordered are listed, but only abnormal results are displayed) Labs Reviewed  RESPIRATORY PANEL BY RT PCR (FLU A&B, COVID)    EKG EKG Interpretation  Date/Time:  Sunday March 09 2020 00:42:04 EDT Ventricular Rate:  76 PR Interval:  QRS Duration: 102 QT Interval:  379 QTC Calculation: 427 R Axis:   68 Text Interpretation: Sinus rhythm Probable left ventricular hypertrophy Confirmed by Thayer Jew [id number]) on 03/09/2020 12:43:17 AM   Radiology DG Chest Portable 1 View  Result Date: 03/09/2020 CLINICAL DATA:  Cough, chest pain, body aches, and fatigue. EXAM: PORTABLE CHEST 1 VIEW COMPARISON:  07/27/2007 FINDINGS: The heart size and mediastinal contours are within normal limits. Both lungs are clear. The visualized skeletal structures are unremarkable. IMPRESSION: No active disease. Electronically Signed   By: Lucienne Capers M.D.   On: 03/09/2020 00:57    Procedures Procedures (including critical care time)  Medications Ordered in ED Medications - No data to display  ED Course  I have reviewed the triage vital signs and the nursing notes.  Pertinent labs & imaging results that were available during my care of the patient were reviewed by me and considered in my medical decision making (see chart for details).    MDM Rules/Calculators/A&P                           Patient presents with upper respiratory symptoms.  She is overall nontoxic and vital signs are largely reassuring.  O2 sats are 95% on room air.  She has had no known Covid contacts and is fully vaccinated.  However, this is her concern.  Other considerations include but not limited to, influenza, viral upper respiratory symptoms, pneumonia.  Given chest discomfort,  EKG was obtained.  No evidence of acute ischemia or arrhythmia.  Chest x-ray without evidence of pneumonia or pneumothorax and this was independently reviewed by myself.  Respiratory viral panel was negative for both Covid and influenza.  Patient updated.  Symptomatic control recommended.  Patient ambulated maintained pulse ox.  After history, exam, and medical workup I feel the patient has been appropriately medically screened and is safe for discharge home. Pertinent diagnoses were discussed with the patient. Patient was given return precautions.   Final Clinical Impression(s) / ED Diagnoses Final diagnoses:  URI with cough and congestion    Rx / DC Orders ED Discharge Orders    None       Laura-Lee Villegas, Barbette Hair, MD 03/09/20 0107

## 2020-03-09 NOTE — ED Notes (Signed)
Patient ambulatory to the lobby with a steady gait and belongings.

## 2020-03-09 NOTE — ED Notes (Signed)
sats while ambulating were 96-97%ra.

## 2020-03-09 NOTE — Discharge Instructions (Addendum)
You are seen today for upper respiratory symptoms and cough.  Your flu and Covid testing is negative.  Your chest x-ray does not show any pneumonia.  Symptom control with over-the-counter medications, hydration are important.  Follow-up with your primary doctor as needed.

## 2020-03-10 ENCOUNTER — Telehealth: Payer: Self-pay

## 2020-03-10 NOTE — Telephone Encounter (Signed)
Nurse Assessment Nurse: Lovena Le, RN, Santiago Glad Date/Time Eilene Ghazi Time): 03/08/2020 9:26:21 PM Confirm and document reason for call. If symptomatic, describe symptoms. ---Caller states she has body aches, congestion, prod cough, nasal drainage with clear mucus. sore throat, no fever. Does the patient have any new or worsening symptoms? ---Yes Will a triage be completed? ---Yes Related visit to physician within the last 2 weeks? ---No Does the PT have any chronic conditions? (i.e. diabetes, asthma, this includes High risk factors for pregnancy, etc.) ---Yes List chronic conditions. ---htn Is this a behavioral health or substance abuse call? ---No Guidelines Guideline Title Affirmed Question Affirmed Notes Nurse Date/Time (Eastern Time) COVID-19 - Diagnosed or Suspected SEVERE or constant chest pain or pressure (Exception: mild central chest pain, present only when coughing) Lovena Le, RN, Santiago Glad 03/08/2020 9:28:49 PM Disp. Time Eilene Ghazi Time) Disposition Final User 03/08/2020 9:08:51 PM Send To RN Personal Merry Lofty, RN, Lattie Haw 03/08/2020 9:30:30 PM Go to ED Now Yes Lovena Le, RN, Ralene Bathe NOTE: All timestamps contained within this report are represented as [place] Standard Time. CONFIDENTIALTY NOTICE: This fax transmission is intended only for the addressee. It contains information that is legally privileged, confidential or otherwise protected from use or disclosure. If you are not the intended recipient, you are strictly prohibited from reviewing, disclosing, copying using or disseminating any of this information or taking any action in reliance on or regarding this information. If you have received this fax in error, please notify us immediately by telephone so that we can arrange for its return to [place]. Phone: [phone], Toll-Free: [phone], Fax: [phone] Page: 2 of 2 Call Id: [id number] [place] Disagree/Comply Comply Caller Understands Yes PreDisposition Did not know what to do Care  Advice Given Per Guideline GO TO ED NOW: Langley Gauss A MASK - COVER YOUR MOUTH AND NOSE: Comments User: Moishe Spice, RN Date/Time Eilene Ghazi Time): 03/08/2020 9:29:38 PM she is vaccinated Referrals GO TO FACILITY UNDECIDED   Pt seen at [facility] ED.

## 2020-03-17 ENCOUNTER — Other Ambulatory Visit: Payer: Self-pay | Admitting: Family Medicine

## 2020-03-19 DIAGNOSIS — M5431 Sciatica, right side: Secondary | ICD-10-CM | POA: Diagnosis not present

## 2020-03-19 DIAGNOSIS — M5134 Other intervertebral disc degeneration, thoracic region: Secondary | ICD-10-CM | POA: Diagnosis not present

## 2020-03-19 DIAGNOSIS — M5136 Other intervertebral disc degeneration, lumbar region: Secondary | ICD-10-CM | POA: Diagnosis not present

## 2020-03-19 DIAGNOSIS — M9902 Segmental and somatic dysfunction of thoracic region: Secondary | ICD-10-CM | POA: Diagnosis not present

## 2020-03-19 DIAGNOSIS — M9905 Segmental and somatic dysfunction of pelvic region: Secondary | ICD-10-CM | POA: Diagnosis not present

## 2020-03-19 DIAGNOSIS — M9903 Segmental and somatic dysfunction of lumbar region: Secondary | ICD-10-CM | POA: Diagnosis not present

## 2020-04-09 DIAGNOSIS — M5431 Sciatica, right side: Secondary | ICD-10-CM | POA: Diagnosis not present

## 2020-04-09 DIAGNOSIS — M5134 Other intervertebral disc degeneration, thoracic region: Secondary | ICD-10-CM | POA: Diagnosis not present

## 2020-04-09 DIAGNOSIS — M9905 Segmental and somatic dysfunction of pelvic region: Secondary | ICD-10-CM | POA: Diagnosis not present

## 2020-04-09 DIAGNOSIS — M5136 Other intervertebral disc degeneration, lumbar region: Secondary | ICD-10-CM | POA: Diagnosis not present

## 2020-04-09 DIAGNOSIS — M9903 Segmental and somatic dysfunction of lumbar region: Secondary | ICD-10-CM | POA: Diagnosis not present

## 2020-04-09 DIAGNOSIS — M9902 Segmental and somatic dysfunction of thoracic region: Secondary | ICD-10-CM | POA: Diagnosis not present

## 2020-04-30 DIAGNOSIS — M5136 Other intervertebral disc degeneration, lumbar region: Secondary | ICD-10-CM | POA: Diagnosis not present

## 2020-04-30 DIAGNOSIS — M5134 Other intervertebral disc degeneration, thoracic region: Secondary | ICD-10-CM | POA: Diagnosis not present

## 2020-04-30 DIAGNOSIS — M5431 Sciatica, right side: Secondary | ICD-10-CM | POA: Diagnosis not present

## 2020-04-30 DIAGNOSIS — M9903 Segmental and somatic dysfunction of lumbar region: Secondary | ICD-10-CM | POA: Diagnosis not present

## 2020-04-30 DIAGNOSIS — M9905 Segmental and somatic dysfunction of pelvic region: Secondary | ICD-10-CM | POA: Diagnosis not present

## 2020-04-30 DIAGNOSIS — M9902 Segmental and somatic dysfunction of thoracic region: Secondary | ICD-10-CM | POA: Diagnosis not present

## 2020-05-14 ENCOUNTER — Other Ambulatory Visit: Payer: Self-pay | Admitting: Family Medicine

## 2020-05-14 MED ORDER — CELECOXIB 200 MG PO CAPS: 200.0000 mg | ORAL_CAPSULE | Freq: Two times a day (BID) | ORAL | 0 refills | Status: DC | PRN

## 2020-05-18 ENCOUNTER — Other Ambulatory Visit: Payer: Self-pay | Admitting: Family Medicine

## 2020-05-18 DIAGNOSIS — G894 Chronic pain syndrome: Secondary | ICD-10-CM

## 2020-05-21 ENCOUNTER — Other Ambulatory Visit: Payer: Self-pay

## 2020-05-21 DIAGNOSIS — M9905 Segmental and somatic dysfunction of pelvic region: Secondary | ICD-10-CM | POA: Diagnosis not present

## 2020-05-21 DIAGNOSIS — M5136 Other intervertebral disc degeneration, lumbar region: Secondary | ICD-10-CM | POA: Diagnosis not present

## 2020-05-21 DIAGNOSIS — M5431 Sciatica, right side: Secondary | ICD-10-CM | POA: Diagnosis not present

## 2020-05-21 DIAGNOSIS — M9903 Segmental and somatic dysfunction of lumbar region: Secondary | ICD-10-CM | POA: Diagnosis not present

## 2020-05-21 DIAGNOSIS — M9902 Segmental and somatic dysfunction of thoracic region: Secondary | ICD-10-CM | POA: Diagnosis not present

## 2020-05-21 DIAGNOSIS — M5134 Other intervertebral disc degeneration, thoracic region: Secondary | ICD-10-CM | POA: Diagnosis not present

## 2020-05-21 DIAGNOSIS — G894 Chronic pain syndrome: Secondary | ICD-10-CM

## 2020-05-21 NOTE — Telephone Encounter (Signed)
Requesting: Lyrica 75mg  Contract: 04/26/2019 UDS: 04/26/2019 Last Visit: 10/25/2019 Next Visit: None scheduled Last Refill: 02/19/2020 #180 and 0RF Pt sig: 1 tab bid   Please Advise

## 2020-05-22 MED ORDER — PREGABALIN 75 MG PO CAPS: ORAL_CAPSULE | ORAL | 0 refills | Status: DC

## 2020-06-11 DIAGNOSIS — M9905 Segmental and somatic dysfunction of pelvic region: Secondary | ICD-10-CM | POA: Diagnosis not present

## 2020-06-11 DIAGNOSIS — M5431 Sciatica, right side: Secondary | ICD-10-CM | POA: Diagnosis not present

## 2020-06-11 DIAGNOSIS — M9902 Segmental and somatic dysfunction of thoracic region: Secondary | ICD-10-CM | POA: Diagnosis not present

## 2020-06-11 DIAGNOSIS — M5134 Other intervertebral disc degeneration, thoracic region: Secondary | ICD-10-CM | POA: Diagnosis not present

## 2020-06-11 DIAGNOSIS — M5136 Other intervertebral disc degeneration, lumbar region: Secondary | ICD-10-CM | POA: Diagnosis not present

## 2020-06-11 DIAGNOSIS — M9903 Segmental and somatic dysfunction of lumbar region: Secondary | ICD-10-CM | POA: Diagnosis not present

## 2020-06-14 ENCOUNTER — Encounter: Payer: Self-pay | Admitting: Family Medicine

## 2020-06-15 DIAGNOSIS — Z20828 Contact with and (suspected) exposure to other viral communicable diseases: Secondary | ICD-10-CM | POA: Diagnosis not present

## 2020-06-17 ENCOUNTER — Other Ambulatory Visit: Payer: Self-pay

## 2020-06-17 ENCOUNTER — Encounter: Payer: Self-pay | Admitting: Family Medicine

## 2020-06-17 ENCOUNTER — Telehealth (INDEPENDENT_AMBULATORY_CARE_PROVIDER_SITE_OTHER): Payer: Medicare PPO | Admitting: Family Medicine

## 2020-06-17 DIAGNOSIS — J40 Bronchitis, not specified as acute or chronic: Secondary | ICD-10-CM

## 2020-06-17 MED ORDER — AZITHROMYCIN 250 MG PO TABS: ORAL_TABLET | ORAL | 0 refills | Status: DC

## 2020-06-17 MED ORDER — PREDNISONE 10 MG PO TABS: ORAL_TABLET | ORAL | 0 refills | Status: DC

## 2020-06-17 NOTE — Progress Notes (Signed)
Virtual Visit via Video Note  I connected with [facility] on 06/17/20 at  2:00 PM EST by a video enabled telemedicine application and verified that I am speaking with the correct person using two identifiers.  Location/ people involved in visit  Patient: in her car on line for covid test Provider: home    I discussed the limitations of evaluation and management by telemedicine and the availability of in person appointments. The patient expressed understanding and agreed to proceed.  History of Present Illness: Pt is in her car waiting for a covid test  Pt has sinus congestion cough ---- dry  Keeping her up    Observations/Objective: There were no vitals filed for this visit.  Pt is in nad-- no sob  Assessment and Plan: 1. Bronchitis z pack / pred taper Cough med for night time Rest , drink plenty of fluids Call us with covid results  - azithromycin (ZITHROMAX Z-PAK) 250 MG tablet; As directed  Dispense: 6 each; Refill: 0 - predniSONE (DELTASONE) 10 MG tablet; TAKE 3 TABLETS PO QD FOR 3 DAYS THEN TAKE 2 TABLETS PO QD FOR 3 DAYS THEN TAKE 1 TABLET PO QD FOR 3 DAYS THEN TAKE 1/2 TAB PO QD FOR 3 DAYS  Dispense: 20 tablet; Refill: 0   Follow Up Instructions:    I discussed the assessment and treatment plan with the patient. The patient was provided an opportunity to ask questions and all were answered. The patient agreed with the plan and demonstrated an understanding of the instructions.   The patient was advised to call back or seek an in-person evaluation if the symptoms worsen or if the condition fails to improve as anticipated.  I provided 25 minutes of non-face-to-face time during this encounter.   Donato Schultz, DO

## 2020-06-25 ENCOUNTER — Other Ambulatory Visit: Payer: Self-pay

## 2020-06-25 ENCOUNTER — Telehealth (INDEPENDENT_AMBULATORY_CARE_PROVIDER_SITE_OTHER): Payer: Medicare PPO | Admitting: Family Medicine

## 2020-06-25 ENCOUNTER — Encounter: Payer: Self-pay | Admitting: Family Medicine

## 2020-06-25 ENCOUNTER — Ambulatory Visit (INDEPENDENT_AMBULATORY_CARE_PROVIDER_SITE_OTHER)
Admission: RE | Admit: 2020-06-25 | Discharge: 2020-06-25 | Disposition: A | Discharge status: Home / Self Care | Payer: Medicare PPO | Source: Ambulatory Visit | Attending: Family Medicine | Admitting: Family Medicine

## 2020-06-25 DIAGNOSIS — J40 Bronchitis, not specified as acute or chronic: Secondary | ICD-10-CM | POA: Diagnosis not present

## 2020-06-25 DIAGNOSIS — R059 Cough, unspecified: Secondary | ICD-10-CM | POA: Diagnosis not present

## 2020-06-25 DIAGNOSIS — R5383 Other fatigue: Secondary | ICD-10-CM | POA: Diagnosis not present

## 2020-06-25 DIAGNOSIS — R0602 Shortness of breath: Secondary | ICD-10-CM | POA: Diagnosis not present

## 2020-06-25 MED ORDER — AZITHROMYCIN 250 MG PO TABS: ORAL_TABLET | ORAL | 0 refills | Status: DC

## 2020-06-25 MED ORDER — BUDESONIDE-FORMOTEROL FUMARATE 80-4.5 MCG/ACT IN AERO: 2.0000 | INHALATION_SPRAY | Freq: Two times a day (BID) | RESPIRATORY_TRACT | 3 refills | Status: DC

## 2020-06-25 MED ORDER — ALBUTEROL SULFATE HFA 108 (90 BASE) MCG/ACT IN AERS: 2.0000 | INHALATION_SPRAY | Freq: Four times a day (QID) | RESPIRATORY_TRACT | 0 refills | Status: DC | PRN

## 2020-06-25 NOTE — Progress Notes (Signed)
Virtual Visit via Video Note  I connected with [facility] on 06/25/20 at  8:20 AM EST by a video enabled telemedicine application and verified that I am speaking with the correct person using two identifiers.  Location: Patient: home alone Provider: home    I discussed the limitations of evaluation and management by telemedicine and the availability of in person appointments. The patient expressed understanding and agreed to proceed.  History of Present Illness: Pt is home c/o congestion and sob with chest pain covid test neg Chest feels tight    Observations/Objective: There were no vitals filed for this visit. Pt is in nad---symptoms are not improving   Assessment and Plan: 1. Cough   - DG Chest 2 View; Future  2. Bronchitis ? Pneumonia-- covid results came back last night --- neg  Check cxr F/u in office in 10-14 days  - azithromycin (ZITHROMAX Z-PAK) 250 MG tablet; As directed  Dispense: 6 each; Refill: 0 - budesonide-formoterol (SYMBICORT) 80-4.5 MCG/ACT inhaler; Inhale 2 puffs into the lungs 2 (two) times daily.  Dispense: 1 each; Refill: 3 - albuterol (VENTOLIN HFA) 108 (90 Base) MCG/ACT inhaler; Inhale 2 puffs into the lungs every 6 (six) hours as needed for wheezing or shortness of breath.  Dispense: 8 g; Refill: 0   Follow Up Instructions:    I discussed the assessment and treatment plan with the patient. The patient was provided an opportunity to ask questions and all were answered. The patient agreed with the plan and demonstrated an understanding of the instructions.   The patient was advised to call back or seek an in-person evaluation if the symptoms worsen or if the condition fails to improve as anticipated.  I provided 25 minutes of non-face-to-face time during this encounter.   [name], DO

## 2020-07-02 ENCOUNTER — Other Ambulatory Visit: Payer: Self-pay

## 2020-07-02 ENCOUNTER — Ambulatory Visit (INDEPENDENT_AMBULATORY_CARE_PROVIDER_SITE_OTHER)
Admission: RE | Admit: 2020-07-02 | Discharge: 2020-07-02 | Disposition: A | Discharge status: Home / Self Care | Payer: Medicare PPO | Source: Ambulatory Visit | Attending: Cardiology | Admitting: Cardiology

## 2020-07-02 DIAGNOSIS — M9903 Segmental and somatic dysfunction of lumbar region: Secondary | ICD-10-CM | POA: Diagnosis not present

## 2020-07-02 DIAGNOSIS — M5431 Sciatica, right side: Secondary | ICD-10-CM | POA: Diagnosis not present

## 2020-07-02 DIAGNOSIS — M9902 Segmental and somatic dysfunction of thoracic region: Secondary | ICD-10-CM | POA: Diagnosis not present

## 2020-07-02 DIAGNOSIS — M5136 Other intervertebral disc degeneration, lumbar region: Secondary | ICD-10-CM | POA: Diagnosis not present

## 2020-07-02 DIAGNOSIS — Z87891 Personal history of nicotine dependence: Secondary | ICD-10-CM

## 2020-07-02 DIAGNOSIS — M9905 Segmental and somatic dysfunction of pelvic region: Secondary | ICD-10-CM | POA: Diagnosis not present

## 2020-07-02 DIAGNOSIS — M5134 Other intervertebral disc degeneration, thoracic region: Secondary | ICD-10-CM | POA: Diagnosis not present

## 2020-07-05 ENCOUNTER — Other Ambulatory Visit: Payer: Self-pay | Admitting: Family Medicine

## 2020-07-05 DIAGNOSIS — G894 Chronic pain syndrome: Secondary | ICD-10-CM

## 2020-07-07 NOTE — Telephone Encounter (Addendum)
Requesting:Lyrica  Contract: 05/04/19 UDS: 05/04/19 Last Visit:  In person visit 07/27/19 Next Visit: 07/11/19 Last Refill: 05/22/20  Please Advise

## 2020-07-10 ENCOUNTER — Encounter: Payer: Self-pay | Admitting: Family Medicine

## 2020-07-10 ENCOUNTER — Ambulatory Visit: Payer: Medicare PPO | Admitting: Family Medicine

## 2020-07-10 ENCOUNTER — Other Ambulatory Visit: Payer: Self-pay

## 2020-07-10 VITALS — BP 150/100 | HR 91 | Temp 98.2°F | Resp 20 | Ht 69.0 in | Wt 256.0 lb

## 2020-07-10 DIAGNOSIS — J439 Emphysema, unspecified: Secondary | ICD-10-CM | POA: Diagnosis not present

## 2020-07-10 DIAGNOSIS — J40 Bronchitis, not specified as acute or chronic: Secondary | ICD-10-CM | POA: Diagnosis not present

## 2020-07-10 MED ORDER — BUDESONIDE-FORMOTEROL FUMARATE 80-4.5 MCG/ACT IN AERO: 2.0000 | INHALATION_SPRAY | Freq: Two times a day (BID) | RESPIRATORY_TRACT | 3 refills | Status: DC

## 2020-07-10 NOTE — Patient Instructions (Signed)
Chronic Obstructive Pulmonary Disease  Chronic obstructive pulmonary disease (COPD) is a long-term (chronic) lung problem. When you have COPD, it is hard for air to get in and out of your lungs. Usually the condition gets worse over time, and your lungs will never return to normal. There are things you can do to keep yourself as healthy as possible. What are the causes?  Smoking. This is the most common cause.  Certain genes passed from parent to child (inherited). What increases the risk?  Being exposed to secondhand smoke from cigarettes, pipes, or cigars.  Being exposed to chemicals and other irritants, such as fumes and dust in the work environment.  Having chronic lung conditions or infections. What are the signs or symptoms?  Shortness of breath, especially during physical activity.  A long-term cough with a large amount of thick mucus. Sometimes, the cough may not have any mucus (dry cough).  Wheezing.  Breathing quickly.  Skin that looks gray or blue, especially in the fingers, toes, or lips.  Feeling tired (fatigue).  Weight loss.  Chest tightness.  Having infections often.  Episodes when breathing symptoms become much worse (exacerbations). At the later stages of this disease, you may have swelling in the ankles, feet, or legs. How is this treated?  Taking medicines.  Quitting smoking, if you smoke.  Rehabilitation. This includes steps to make your body work better. It may involve a team of specialists.  Doing exercises.  Making changes to your diet.  Using oxygen.  Lung surgery.  Lung transplant.  Comfort measures (palliative care). Follow these instructions at home: Medicines  Take over-the-counter and prescription medicines only as told by your doctor.  Talk to your doctor before taking any cough or allergy medicines. You may need to avoid medicines that cause your lungs to be dry. Lifestyle  If you smoke, stop smoking. Smoking makes the  problem worse.  Do not smoke or use any products that contain nicotine or tobacco. If you need help quitting, ask your doctor.  Avoid being around things that make your breathing worse. This may include smoke, chemicals, and fumes.  Stay active, but remember to rest as well.  Learn and use tips on how to manage stress and control your breathing.  Make sure you get enough sleep. Most adults need at least 7 hours of sleep every night.  Eat healthy foods. Eat smaller meals more often. Rest before meals. Controlled breathing Learn and use tips on how to control your breathing as told by your doctor. Try:  Breathing in (inhaling) through your nose for 1 second. Then, pucker your lips and breath out (exhale) through your lips for 2 seconds.  Putting one hand on your belly (abdomen). Breathe in slowly through your nose for 1 second. Your hand on your belly should move out. Pucker your lips and breathe out slowly through your lips. Your hand on your belly should move in as you breathe out.   Controlled coughing Learn and use controlled coughing to clear mucus from your lungs. Follow these steps: 1. Lean your head a little forward. 2. Breathe in deeply. 3. Try to hold your breath for 3 seconds. 4. Keep your mouth slightly open while coughing 2 times. 5. Spit any mucus out into a tissue. 6. Rest and do the steps again 1 or 2 times as needed. General instructions  Make sure you get all the shots (vaccines) that your doctor recommends. Ask your doctor about a flu shot and a pneumonia shot.    Use oxygen therapy and pulmonary rehabilitation if told by your doctor. If you need home oxygen therapy, ask your doctor if you should buy a tool to measure your oxygen level (oximeter).  Make a COPD action plan with your doctor. This helps you to know what to do if you feel worse than usual.  Manage any other conditions you have as told by your doctor.  Avoid going outside when it is very hot, cold, or  humid.  Avoid people who have a sickness you can catch (contagious).  Keep all follow-up visits. Contact a doctor if:  You cough up more mucus than usual.  There is a change in the color or thickness of the mucus.  It is harder to breathe than usual.  Your breathing is faster than usual.  You have trouble sleeping.  You need to use your medicines more often than usual.  You have trouble doing your normal activities such as getting dressed or walking around the house. Get help right away if:  You have shortness of breath while resting.  You have shortness of breath that stops you from: ? Being able to talk. ? Doing normal activities.  Your chest hurts for longer than 5 minutes.  Your skin color is more blue than usual.  Your pulse oximeter shows that you have low oxygen for longer than 5 minutes.  You have a fever.  You feel too tired to breathe normally. These symptoms may represent a serious problem that is an emergency. Do not wait to see if the symptoms will go away. Get medical help right away. Call your local emergency services (911 in the [place] Do not drive yourself to the hospital. Summary  Chronic obstructive pulmonary disease (COPD) is a long-term lung problem.  The way your lungs work will never return to normal. Usually the condition gets worse over time. There are things you can do to keep yourself as healthy as possible.  Take over-the-counter and prescription medicines only as told by your doctor.  If you smoke, stop. Smoking makes the problem worse. This information is not intended to replace advice given to you by your health care provider. Make sure you discuss any questions you have with your health care provider. Document Revised: 04/08/2020 Document Reviewed: 04/08/2020 Elsevier Patient Education  2021 [employer]   

## 2020-07-11 DIAGNOSIS — J439 Emphysema, unspecified: Secondary | ICD-10-CM | POA: Insufficient documentation

## 2020-07-11 NOTE — Progress Notes (Signed)
Patient ID: Veronica Solomon, female    DOB: August 19, 1955  Age: 65 y.o. MRN: [id number]    Subjective:  Subjective  HPI [facility] presents for f/u cough.  covid test neg.   She c/o some sob  She never picked up symbicort---- pt states she did know she was supposed to take it  Symptoms since 1/1   Review of Systems  Constitutional: Negative for appetite change, diaphoresis, fatigue and unexpected weight change.  HENT: Negative for congestion and postnasal drip.   Eyes: Negative for pain, redness and visual disturbance.  Respiratory: Positive for shortness of breath. Negative for cough, chest tightness and wheezing.   Cardiovascular: Negative for chest pain, palpitations and leg swelling.  Endocrine: Negative for cold intolerance, heat intolerance, polydipsia, polyphagia and polyuria.  Genitourinary: Negative for difficulty urinating, dysuria and frequency.  Neurological: Negative for dizziness, light-headedness, numbness and headaches.    History Past Medical History:  Diagnosis Date  . Adenomatous colon polyp   . Allergy   . C. difficile colitis   . Diverticulosis   . Hyperlipidemia   . Hypertension   . IBS (irritable bowel syndrome)   . Neuromuscular disorder [facility])     She has a past surgical history that includes Abdominal hysterectomy (2002).   Her family history includes Breast cancer in her sister; Depression in her mother; Diabetes in her maternal grandmother; Heart attack in her mother; Hypertension in her mother and paternal grandmother; Uterine cancer in her mother.She reports that she quit smoking about 8 years ago. Her smoking use included cigarettes. She has a 42.00 pack-year smoking history. She has quit using smokeless tobacco. She reports current alcohol use. She reports that she does not use drugs.  Current Outpatient Medications on File Prior to Visit  Medication Sig Dispense Refill  . albuterol (VENTOLIN HFA) 108 (90 Base) MCG/ACT inhaler Inhale  2 puffs into the lungs every 6 (six) hours as needed for wheezing or shortness of breath. 8 g 0  . amLODipine (NORVASC) 5 MG tablet TAKE 1 TABLET(5 MG) BY MOUTH DAILY 90 tablet 1  . atorvastatin (LIPITOR) 20 MG tablet TAKE 1 TABLET(20 MG) BY MOUTH DAILY 90 tablet 1  . celecoxib (CELEBREX) 200 MG capsule Take 1 capsule (200 mg total) by mouth 2 (two) times daily as needed for moderate pain. 180 capsule 0  . DULoxetine (CYMBALTA) 60 MG capsule Take 2 capsules (120 mg total) by mouth daily. 180 capsule 1  . HYDROcodone-acetaminophen (NORCO) 10-325 MG tablet Take 1 tablet by mouth every 8 (eight) hours as needed. 30 tablet 0  . naproxen (NAPROSYN) 500 MG tablet Take 1 tablet (500 mg total) by mouth 2 (two) times daily with a meal. 60 tablet 1  . pregabalin (LYRICA) 75 MG capsule TAKE 1 CAPSULE(75 MG) BY MOUTH TWICE DAILY 180 capsule 1  . traMADol (ULTRAM) 50 MG tablet Take 1 tablet (50 mg total) by mouth every 6 (six) hours as needed. 90 tablet 0   No current facility-administered medications on file prior to visit.     Objective:  Objective  Physical Exam Vitals and nursing note reviewed.  Constitutional:      Appearance: She is well-developed and well-nourished.  HENT:     Head: Normocephalic and atraumatic.  Eyes:     Extraocular Movements: EOM normal.     Conjunctiva/sclera: Conjunctivae normal.  Neck:     Thyroid: No thyromegaly.     Vascular: No carotid bruit or JVD.  Cardiovascular:  Rate and Rhythm: Normal rate and regular rhythm.     Heart sounds: Normal heart sounds. No murmur heard.   Pulmonary:     Effort: Pulmonary effort is normal. No respiratory distress.     Breath sounds: Normal breath sounds. No wheezing or rales.  Chest:     Chest wall: No tenderness.  Musculoskeletal:        General: No edema.     Cervical back: Normal range of motion and neck supple.  Neurological:     Mental Status: She is alert and oriented to person, place, and time.  Psychiatric:         Mood and Affect: Mood and affect normal.    BP (!) 150/100 (BP Location: Right Arm, Patient Position: Sitting, Cuff Size: Large)   Pulse 91   Temp 98.2 F (36.8 C) (Oral)   Resp 20   Ht 5\' 9"  (1.753 m)   Wt 256 lb (116.1 kg)   SpO2 96%   BMI 37.80 kg/m  Wt Readings from Last 3 Encounters:  07/10/20 256 lb (116.1 kg)  03/08/20 254 lb 12.8 oz (115.6 kg)  07/27/19 252 lb 8 oz (114.5 kg)     Lab Results  Component Value Date   WBC 8.9 07/03/2015   HGB 13.8 07/03/2015   HCT 42.2 07/03/2015   PLT 262.0 07/03/2015   GLUCOSE 98 07/27/2019   CHOL 165 07/27/2019   TRIG 83 07/27/2019   HDL 63 07/27/2019   LDLDIRECT 195.7 01/12/2010   LDLCALC 85 07/27/2019   ALT 18 07/27/2019   AST 18 07/27/2019   NA 141 07/27/2019   K 5.3 07/27/2019   CL 103 07/27/2019   CREATININE 0.77 07/27/2019   BUN 16 07/27/2019   CO2 29 07/27/2019   TSH 1.13 01/26/2018   HGBA1C 5.8 12/30/2014   MICROALBUR 4.1 (H) 11/24/2012    CT CHEST LUNG CA SCREEN LOW DOSE W/O CM  Result Date: 07/03/2020 CLINICAL DATA:  65 year old female former smoker (quit in July 2013) with 42 pack-year history of smoking. Lung cancer screening examination. EXAM: CT CHEST WITHOUT CONTRAST LOW-DOSE FOR LUNG CANCER SCREENING TECHNIQUE: Multidetector CT imaging of the chest was performed following the standard protocol without IV contrast. COMPARISON:  Low-dose lung cancer screening chest CT 03/03/2018. FINDINGS: Cardiovascular: Heart size is normal. There is no significant pericardial fluid, thickening or pericardial calcification. There is aortic atherosclerosis, as well as atherosclerosis of the great vessels of the mediastinum and the coronary arteries, including calcified atherosclerotic plaque in the left anterior descending coronary artery. Mediastinum/Nodes: No pathologically enlarged mediastinal or hilar lymph nodes. Please note that accurate exclusion of hilar adenopathy is limited on noncontrast CT scans. Esophagus is  unremarkable in appearance. No axillary lymphadenopathy. Lungs/Pleura: Multiple small pulmonary nodules are again noted in the lungs bilaterally, similar in size and number to the prior examination. The largest of these is a sub solid ground-glass attenuation nodule in the right lower lobe (axial image 179 of series 3), with a volume derived mean diameter of 10.2 mm. No other larger more suspicious appearing pulmonary nodules or masses are noted. No acute consolidative airspace disease. No pleural effusions. Mild diffuse bronchial wall thickening with mild centrilobular and paraseptal emphysema. Upper Abdomen: Aortic atherosclerosis. Musculoskeletal: There are no aggressive appearing lytic or blastic lesions noted in the visualized portions of the skeleton. IMPRESSION: 1. Lung-RADS 2S, benign appearance or behavior. Continue annual screening with low-dose chest CT without contrast in 12 months. 2. The "S" modifier above refers to  potentially clinically significant non lung cancer related findings. Specifically, there is aortic atherosclerosis, in addition to left anterior descending coronary artery disease. Please note that although the presence of coronary artery calcium documents the presence of coronary artery disease, the severity of this disease and any potential stenosis cannot be assessed on this non-gated CT examination. Assessment for potential risk factor modification, dietary therapy or pharmacologic therapy may be warranted, if clinically indicated. 3. Mild diffuse bronchial wall thickening with mild centrilobular and paraseptal emphysema; imaging findings suggestive of underlying COPD. Aortic Atherosclerosis (ICD10-I70.0) and Emphysema (ICD10-J43.9). Electronically Signed   By: Vinnie Langton M.D.   On: 07/03/2020 08:32     Assessment & Plan:  Plan  I have discontinued [name]. Camino's azithromycin, predniSONE, and azithromycin. I am also having her maintain her HYDROcodone-acetaminophen,  traMADol, amLODipine, atorvastatin, naproxen, DULoxetine, celecoxib, albuterol, pregabalin, and budesonide-formoterol.  Meds ordered this encounter  Medications  . budesonide-formoterol (SYMBICORT) 80-4.5 MCG/ACT inhaler    Sig: Inhale 2 puffs into the lungs 2 (two) times daily.    Dispense:  1 each    Refill:  3    Problem List Items Addressed This Visit   None   Visit Diagnoses    Pulmonary emphysema, unspecified emphysema type ([place])    -  Primary   Relevant Medications   budesonide-formoterol (SYMBICORT) 80-4.5 MCG/ACT inhaler   Bronchitis       Relevant Medications   budesonide-formoterol (SYMBICORT) 80-4.5 MCG/ACT inhaler      Follow-up: Return in about 3 weeks (around 07/31/2020).  [name], DO

## 2020-07-11 NOTE — Assessment & Plan Note (Signed)
symbicort bid con't prn albuterol  F/u prn

## 2020-07-15 NOTE — Progress Notes (Signed)
Please call patient and let them  know their  low dose Ct was read as a  Lung RADS 2: nodules that are benign in appearance and behavior with a very low likelihood of becoming a clinically active cancer due to size or lack of growth. Recommendation per radiology is for a repeat LDCT in 12 months. .Please let them  know we will order and schedule their  annual screening scan for 06/2021 Please let them  know there was notation of CAD on their  scan.  Please remind the patient  that this is a non-gated exam therefore degree or severity of disease  cannot be determined. Please have them  follow up with their PCP regarding potential risk factor modification, dietary therapy or pharmacologic therapy if clinically indicated. Pt.  is  currently on statin therapy. Please place order for annual  screening scan for  06/2021 and fax results to PCP. Thanks so much.  Langley Gauss, there was notation of CAD. Please make sure she follows up with PCP. Thanks so much

## 2020-07-21 ENCOUNTER — Other Ambulatory Visit: Payer: Self-pay | Admitting: *Deleted

## 2020-07-21 DIAGNOSIS — Z87891 Personal history of nicotine dependence: Secondary | ICD-10-CM

## 2020-07-23 DIAGNOSIS — M5134 Other intervertebral disc degeneration, thoracic region: Secondary | ICD-10-CM | POA: Diagnosis not present

## 2020-07-23 DIAGNOSIS — M5136 Other intervertebral disc degeneration, lumbar region: Secondary | ICD-10-CM | POA: Diagnosis not present

## 2020-07-23 DIAGNOSIS — M9903 Segmental and somatic dysfunction of lumbar region: Secondary | ICD-10-CM | POA: Diagnosis not present

## 2020-07-23 DIAGNOSIS — M5431 Sciatica, right side: Secondary | ICD-10-CM | POA: Diagnosis not present

## 2020-07-23 DIAGNOSIS — M9902 Segmental and somatic dysfunction of thoracic region: Secondary | ICD-10-CM | POA: Diagnosis not present

## 2020-07-23 DIAGNOSIS — M9905 Segmental and somatic dysfunction of pelvic region: Secondary | ICD-10-CM | POA: Diagnosis not present

## 2020-08-01 ENCOUNTER — Ambulatory Visit: Payer: Medicare PPO | Admitting: Family Medicine

## 2020-08-01 ENCOUNTER — Encounter: Payer: Self-pay | Admitting: Family Medicine

## 2020-08-01 ENCOUNTER — Other Ambulatory Visit: Payer: Self-pay

## 2020-08-01 VITALS — BP 142/86 | HR 83 | Temp 97.8°F | Resp 18 | Wt 257.4 lb

## 2020-08-01 DIAGNOSIS — I10 Essential (primary) hypertension: Secondary | ICD-10-CM | POA: Diagnosis not present

## 2020-08-01 DIAGNOSIS — E785 Hyperlipidemia, unspecified: Secondary | ICD-10-CM | POA: Diagnosis not present

## 2020-08-01 DIAGNOSIS — J439 Emphysema, unspecified: Secondary | ICD-10-CM | POA: Diagnosis not present

## 2020-08-01 MED ORDER — BREZTRI AEROSPHERE 160-9-4.8 MCG/ACT IN AERO: 2.0000 | INHALATION_SPRAY | Freq: Two times a day (BID) | RESPIRATORY_TRACT | 3 refills | Status: DC

## 2020-08-01 MED ORDER — TRELEGY ELLIPTA 100-62.5-25 MCG/INH IN AEPB: INHALATION_SPRAY | RESPIRATORY_TRACT | 2 refills | Status: DC

## 2020-08-01 NOTE — Progress Notes (Signed)
Patient ID: Veronica Solomon, female    DOB: 1955-09-13  Age: 65 y.o. MRN: [id number]    Subjective:  Subjective  HPI [name] presents for f/u copd--- she is using her albuterol more often   Review of Systems  Constitutional: Negative for appetite change, diaphoresis, fatigue and unexpected weight change.  Eyes: Negative for pain, redness and visual disturbance.  Respiratory: Positive for chest tightness, shortness of breath and wheezing. Negative for cough.   Cardiovascular: Negative for chest pain, palpitations and leg swelling.  Endocrine: Negative for cold intolerance, heat intolerance, polydipsia, polyphagia and polyuria.  Genitourinary: Negative for difficulty urinating, dysuria and frequency.  Neurological: Negative for dizziness, light-headedness, numbness and headaches.    History Past Medical History:  Diagnosis Date  . Adenomatous colon polyp   . Allergy   . C. difficile colitis   . Diverticulosis   . Hyperlipidemia   . Hypertension   . IBS (irritable bowel syndrome)   . Neuromuscular disorder [facility])     She has a past surgical history that includes Abdominal hysterectomy (2002).   Her family history includes Breast cancer in her sister; Depression in her mother; Diabetes in her maternal grandmother; Heart attack in her mother; Hypertension in her mother and paternal grandmother; Uterine cancer in her mother.She reports that she quit smoking about 8 years ago. Her smoking use included cigarettes. She has a 42.00 pack-year smoking history. She has quit using smokeless tobacco. She reports current alcohol use. She reports that she does not use drugs.  Current Outpatient Medications on File Prior to Visit  Medication Sig Dispense Refill  . albuterol (VENTOLIN HFA) 108 (90 Base) MCG/ACT inhaler Inhale 2 puffs into the lungs every 6 (six) hours as needed for wheezing or shortness of breath. 8 g 0  . amLODipine (NORVASC) 5 MG tablet TAKE 1 TABLET(5 MG) BY  MOUTH DAILY 90 tablet 1  . atorvastatin (LIPITOR) 20 MG tablet TAKE 1 TABLET(20 MG) BY MOUTH DAILY 90 tablet 1  . celecoxib (CELEBREX) 200 MG capsule Take 1 capsule (200 mg total) by mouth 2 (two) times daily as needed for moderate pain. 180 capsule 0  . DULoxetine (CYMBALTA) 60 MG capsule Take 2 capsules (120 mg total) by mouth daily. 180 capsule 1  . HYDROcodone-acetaminophen (NORCO) 10-325 MG tablet Take 1 tablet by mouth every 8 (eight) hours as needed. 30 tablet 0  . naproxen (NAPROSYN) 500 MG tablet Take 1 tablet (500 mg total) by mouth 2 (two) times daily with a meal. 60 tablet 1  . pregabalin (LYRICA) 75 MG capsule TAKE 1 CAPSULE(75 MG) BY MOUTH TWICE DAILY 180 capsule 1  . traMADol (ULTRAM) 50 MG tablet Take 1 tablet (50 mg total) by mouth every 6 (six) hours as needed. 90 tablet 0   No current facility-administered medications on file prior to visit.     Objective:  Objective  Physical Exam Vitals and nursing note reviewed.  Constitutional:      Appearance: She is well-developed and well-nourished.  HENT:     Head: Normocephalic and atraumatic.  Eyes:     Extraocular Movements: EOM normal.     Conjunctiva/sclera: Conjunctivae normal.  Neck:     Thyroid: No thyromegaly.     Vascular: No carotid bruit or JVD.  Cardiovascular:     Rate and Rhythm: Normal rate and regular rhythm.     Heart sounds: Normal heart sounds. No murmur heard.   Pulmonary:     Effort: Pulmonary effort is normal. No  respiratory distress.     Breath sounds: Normal breath sounds. No wheezing, rhonchi or rales.  Chest:     Chest wall: No tenderness.  Musculoskeletal:        General: No edema.     Cervical back: Normal range of motion and neck supple.  Neurological:     Mental Status: She is alert and oriented to person, place, and time.  Psychiatric:        Mood and Affect: Mood and affect normal.    BP (!) 142/86 (BP Location: Left Arm, Patient Position: Sitting, Cuff Size: Large)   Pulse 83    Temp 97.8 F (36.6 C) (Oral)   Resp 18   Wt 257 lb 6.4 oz (116.8 kg)   SpO2 96%   BMI 38.01 kg/m  Wt Readings from Last 3 Encounters:  08/01/20 257 lb 6.4 oz (116.8 kg)  07/10/20 256 lb (116.1 kg)  03/08/20 254 lb 12.8 oz (115.6 kg)     Lab Results  Component Value Date   WBC 8.9 07/03/2015   HGB 13.8 07/03/2015   HCT 42.2 07/03/2015   PLT 262.0 07/03/2015   GLUCOSE 98 07/27/2019   CHOL 165 07/27/2019   TRIG 83 07/27/2019   HDL 63 07/27/2019   LDLDIRECT 195.7 01/12/2010   LDLCALC 85 07/27/2019   ALT 18 07/27/2019   AST 18 07/27/2019   NA 141 07/27/2019   K 5.3 07/27/2019   CL 103 07/27/2019   CREATININE 0.77 07/27/2019   BUN 16 07/27/2019   CO2 29 07/27/2019   TSH 1.13 01/26/2018   HGBA1C 5.8 12/30/2014   MICROALBUR 4.1 (H) 11/24/2012    CT CHEST LUNG CA SCREEN LOW DOSE W/O CM  Result Date: 07/03/2020 CLINICAL DATA:  65 year old female former smoker (quit in July 2013) with 42 pack-year history of smoking. Lung cancer screening examination. EXAM: CT CHEST WITHOUT CONTRAST LOW-DOSE FOR LUNG CANCER SCREENING TECHNIQUE: Multidetector CT imaging of the chest was performed following the standard protocol without IV contrast. COMPARISON:  Low-dose lung cancer screening chest CT 03/03/2018. FINDINGS: Cardiovascular: Heart size is normal. There is no significant pericardial fluid, thickening or pericardial calcification. There is aortic atherosclerosis, as well as atherosclerosis of the great vessels of the mediastinum and the coronary arteries, including calcified atherosclerotic plaque in the left anterior descending coronary artery. Mediastinum/Nodes: No pathologically enlarged mediastinal or hilar lymph nodes. Please note that accurate exclusion of hilar adenopathy is limited on noncontrast CT scans. Esophagus is unremarkable in appearance. No axillary lymphadenopathy. Lungs/Pleura: Multiple small pulmonary nodules are again noted in the lungs bilaterally, similar in size and  number to the prior examination. The largest of these is a sub solid ground-glass attenuation nodule in the right lower lobe (axial image 179 of series 3), with a volume derived mean diameter of 10.2 mm. No other larger more suspicious appearing pulmonary nodules or masses are noted. No acute consolidative airspace disease. No pleural effusions. Mild diffuse bronchial wall thickening with mild centrilobular and paraseptal emphysema. Upper Abdomen: Aortic atherosclerosis. Musculoskeletal: There are no aggressive appearing lytic or blastic lesions noted in the visualized portions of the skeleton. IMPRESSION: 1. Lung-RADS 2S, benign appearance or behavior. Continue annual screening with low-dose chest CT without contrast in 12 months. 2. The "S" modifier above refers to potentially clinically significant non lung cancer related findings. Specifically, there is aortic atherosclerosis, in addition to left anterior descending coronary artery disease. Please note that although the presence of coronary artery calcium documents the presence of coronary  artery disease, the severity of this disease and any potential stenosis cannot be assessed on this non-gated CT examination. Assessment for potential risk factor modification, dietary therapy or pharmacologic therapy may be warranted, if clinically indicated. 3. Mild diffuse bronchial wall thickening with mild centrilobular and paraseptal emphysema; imaging findings suggestive of underlying COPD. Aortic Atherosclerosis (ICD10-I70.0) and Emphysema (ICD10-J43.9). Electronically Signed   By: Vinnie Langton M.D.   On: 07/03/2020 08:32     Assessment & Plan:  Plan  I have discontinued [name]. Auguste's budesonide-formoterol. I am also having her start on Breztri Aerosphere and Trelegy Ellipta. Additionally, I am having her maintain her HYDROcodone-acetaminophen, traMADol, amLODipine, atorvastatin, naproxen, DULoxetine, celecoxib, albuterol, and pregabalin.  Meds ordered  this encounter  Medications  . Budeson-Glycopyrrol-Formoterol (BREZTRI AEROSPHERE) 160-9-4.8 MCG/ACT AERO    Sig: Inhale 2 puffs into the lungs in the morning and at bedtime.    Dispense:  10.7 g    Refill:  3  . Fluticasone-Umeclidin-Vilant (TRELEGY ELLIPTA) 100-62.5-25 MCG/INH AEPB    Sig: 1 inh daily    Dispense:  60 each    Refill:  2    Problem List Items Addressed This Visit      Unprioritized   Essential hypertension    Well controlled, no changes to meds. Encouraged heart healthy diet such as the DASH diet and exercise as tolerated.       Hyperlipidemia    Encouraged heart healthy diet, increase exercise, avoid trans fats, consider a krill oil cap daily      Relevant Orders   Lipid panel   Comprehensive metabolic panel   Pulmonary emphysema (HCC) - Primary    Change symbicort to breztri Refill albuterol       Relevant Medications   Budeson-Glycopyrrol-Formoterol (BREZTRI AEROSPHERE) 160-9-4.8 MCG/ACT AERO   Fluticasone-Umeclidin-Vilant (TRELEGY ELLIPTA) 100-62.5-25 MCG/INH AEPB      Follow-up: Return in about 6 months (around 01/29/2021), or if symptoms worsen or fail to improve.  [name], DO

## 2020-08-01 NOTE — Patient Instructions (Signed)
Chronic Obstructive Pulmonary Disease  Chronic obstructive pulmonary disease (COPD) is a long-term (chronic) lung problem. When you have COPD, it is hard for air to get in and out of your lungs. Usually the condition gets worse over time, and your lungs will never return to normal. There are things you can do to keep yourself as healthy as possible. What are the causes?  Smoking. This is the most common cause.  Certain genes passed from parent to child (inherited). What increases the risk?  Being exposed to secondhand smoke from cigarettes, pipes, or cigars.  Being exposed to chemicals and other irritants, such as fumes and dust in the work environment.  Having chronic lung conditions or infections. What are the signs or symptoms?  Shortness of breath, especially during physical activity.  A long-term cough with a large amount of thick mucus. Sometimes, the cough may not have any mucus (dry cough).  Wheezing.  Breathing quickly.  Skin that looks gray or blue, especially in the fingers, toes, or lips.  Feeling tired (fatigue).  Weight loss.  Chest tightness.  Having infections often.  Episodes when breathing symptoms become much worse (exacerbations). At the later stages of this disease, you may have swelling in the ankles, feet, or legs. How is this treated?  Taking medicines.  Quitting smoking, if you smoke.  Rehabilitation. This includes steps to make your body work better. It may involve a team of specialists.  Doing exercises.  Making changes to your diet.  Using oxygen.  Lung surgery.  Lung transplant.  Comfort measures (palliative care). Follow these instructions at home: Medicines  Take over-the-counter and prescription medicines only as told by your doctor.  Talk to your doctor before taking any cough or allergy medicines. You may need to avoid medicines that cause your lungs to be dry. Lifestyle  If you smoke, stop smoking. Smoking makes the  problem worse.  Do not smoke or use any products that contain nicotine or tobacco. If you need help quitting, ask your doctor.  Avoid being around things that make your breathing worse. This may include smoke, chemicals, and fumes.  Stay active, but remember to rest as well.  Learn and use tips on how to manage stress and control your breathing.  Make sure you get enough sleep. Most adults need at least 7 hours of sleep every night.  Eat healthy foods. Eat smaller meals more often. Rest before meals. Controlled breathing Learn and use tips on how to control your breathing as told by your doctor. Try:  Breathing in (inhaling) through your nose for 1 second. Then, pucker your lips and breath out (exhale) through your lips for 2 seconds.  Putting one hand on your belly (abdomen). Breathe in slowly through your nose for 1 second. Your hand on your belly should move out. Pucker your lips and breathe out slowly through your lips. Your hand on your belly should move in as you breathe out.   Controlled coughing Learn and use controlled coughing to clear mucus from your lungs. Follow these steps: 1. Lean your head a little forward. 2. Breathe in deeply. 3. Try to hold your breath for 3 seconds. 4. Keep your mouth slightly open while coughing 2 times. 5. Spit any mucus out into a tissue. 6. Rest and do the steps again 1 or 2 times as needed. General instructions  Make sure you get all the shots (vaccines) that your doctor recommends. Ask your doctor about a flu shot and a pneumonia shot.    Use oxygen therapy and pulmonary rehabilitation if told by your doctor. If you need home oxygen therapy, ask your doctor if you should buy a tool to measure your oxygen level (oximeter).  Make a COPD action plan with your doctor. This helps you to know what to do if you feel worse than usual.  Manage any other conditions you have as told by your doctor.  Avoid going outside when it is very hot, cold, or  humid.  Avoid people who have a sickness you can catch (contagious).  Keep all follow-up visits. Contact a doctor if:  You cough up more mucus than usual.  There is a change in the color or thickness of the mucus.  It is harder to breathe than usual.  Your breathing is faster than usual.  You have trouble sleeping.  You need to use your medicines more often than usual.  You have trouble doing your normal activities such as getting dressed or walking around the house. Get help right away if:  You have shortness of breath while resting.  You have shortness of breath that stops you from: ? Being able to talk. ? Doing normal activities.  Your chest hurts for longer than 5 minutes.  Your skin color is more blue than usual.  Your pulse oximeter shows that you have low oxygen for longer than 5 minutes.  You have a fever.  You feel too tired to breathe normally. These symptoms may represent a serious problem that is an emergency. Do not wait to see if the symptoms will go away. Get medical help right away. Call your local emergency services (911 in the [place] Do not drive yourself to the hospital. Summary  Chronic obstructive pulmonary disease (COPD) is a long-term lung problem.  The way your lungs work will never return to normal. Usually the condition gets worse over time. There are things you can do to keep yourself as healthy as possible.  Take over-the-counter and prescription medicines only as told by your doctor.  If you smoke, stop. Smoking makes the problem worse. This information is not intended to replace advice given to you by your health care provider. Make sure you discuss any questions you have with your health care provider. Document Revised: 04/08/2020 Document Reviewed: 04/08/2020 Elsevier Patient Education  2021 [employer]   

## 2020-08-01 NOTE — Assessment & Plan Note (Signed)
Change symbicort to breztri Refill albuterol

## 2020-08-01 NOTE — Assessment & Plan Note (Signed)
Encouraged heart healthy diet, increase exercise, avoid trans fats, consider a krill oil cap daily 

## 2020-08-01 NOTE — Assessment & Plan Note (Signed)
Well controlled, no changes to meds. Encouraged heart healthy diet such as the DASH diet and exercise as tolerated.  °

## 2020-08-02 LAB — LIPID PANEL
Cholesterol: 163 mg/dL (ref <200)
HDL: 67 mg/dL (ref >=50)
LDL Cholesterol (Calc): 79 mg/dL (calc)
Non-HDL Cholesterol (Calc): 96 mg/dL (calc) (ref <130)
Total CHOL/HDL Ratio: 2.4 (calc) (ref <5.0)
Triglycerides: 83 mg/dL (ref <150)

## 2020-08-02 LAB — COMPREHENSIVE METABOLIC PANEL
AG Ratio: 2.1 (calc) (ref 1.0–2.5)
ALT: 27 U/L (ref 6–29)
AST: 23 U/L (ref 10–35)
Albumin: 4.6 g/dL (ref 3.6–5.1)
Alkaline phosphatase (APISO): 111 U/L (ref 37–153)
BUN: 20 mg/dL (ref 7–25)
CO2: 29 mmol/L (ref 20–32)
Calcium: 10.1 mg/dL (ref 8.6–10.4)
Chloride: 105 mmol/L (ref 98–110)
Creat: 0.87 mg/dL (ref 0.50–0.99)
Globulin: 2.2 g/dL (calc) (ref 1.9–3.7)
Glucose, Bld: 89 mg/dL (ref 65–99)
Potassium: 5.6 mmol/L — ABNORMAL HIGH (ref 3.5–5.3)
Sodium: 142 mmol/L (ref 135–146)
Total Bilirubin: 0.6 mg/dL (ref 0.2–1.2)
Total Protein: 6.8 g/dL (ref 6.1–8.1)

## 2020-08-04 ENCOUNTER — Other Ambulatory Visit: Payer: Self-pay | Admitting: Family Medicine

## 2020-08-04 DIAGNOSIS — E876 Hypokalemia: Secondary | ICD-10-CM

## 2020-08-06 DIAGNOSIS — M5136 Other intervertebral disc degeneration, lumbar region: Secondary | ICD-10-CM | POA: Diagnosis not present

## 2020-08-06 DIAGNOSIS — M9903 Segmental and somatic dysfunction of lumbar region: Secondary | ICD-10-CM | POA: Diagnosis not present

## 2020-08-06 DIAGNOSIS — M5134 Other intervertebral disc degeneration, thoracic region: Secondary | ICD-10-CM | POA: Diagnosis not present

## 2020-08-06 DIAGNOSIS — M5431 Sciatica, right side: Secondary | ICD-10-CM | POA: Diagnosis not present

## 2020-08-06 DIAGNOSIS — M9905 Segmental and somatic dysfunction of pelvic region: Secondary | ICD-10-CM | POA: Diagnosis not present

## 2020-08-06 DIAGNOSIS — M9902 Segmental and somatic dysfunction of thoracic region: Secondary | ICD-10-CM | POA: Diagnosis not present

## 2020-08-06 NOTE — Progress Notes (Signed)
Called pt to set up 2 week lab follow up

## 2020-08-13 ENCOUNTER — Encounter: Payer: Self-pay | Admitting: Family Medicine

## 2020-08-13 DIAGNOSIS — G894 Chronic pain syndrome: Secondary | ICD-10-CM

## 2020-08-13 MED ORDER — TRAMADOL HCL 50 MG PO TABS: 50.0000 mg | ORAL_TABLET | Freq: Four times a day (QID) | ORAL | 0 refills | Status: DC | PRN

## 2020-08-13 NOTE — Telephone Encounter (Signed)
Requesting: tramadol 50mg  Contract: 04/26/2019 UDS: 04/26/2019 Last Visit: 08/01/20 Next Visit: None  Last Refill: 03/04/20 #90 AND 0RF  Please Advise

## 2020-08-17 ENCOUNTER — Other Ambulatory Visit: Payer: Self-pay | Admitting: Family Medicine

## 2020-08-20 ENCOUNTER — Other Ambulatory Visit (INDEPENDENT_AMBULATORY_CARE_PROVIDER_SITE_OTHER): Payer: Medicare PPO

## 2020-08-20 ENCOUNTER — Other Ambulatory Visit: Payer: Self-pay

## 2020-08-20 DIAGNOSIS — E876 Hypokalemia: Secondary | ICD-10-CM | POA: Diagnosis not present

## 2020-08-21 LAB — BASIC METABOLIC PANEL
BUN: 15 mg/dL (ref 6–23)
CO2: 33 mEq/L — ABNORMAL HIGH (ref 19–32)
Calcium: 10.1 mg/dL (ref 8.4–10.5)
Chloride: 103 mEq/L (ref 96–112)
Creatinine, Ser: 0.75 mg/dL (ref 0.40–1.20)
GFR: 84.41 mL/min (ref >60.00)
Glucose, Bld: 101 mg/dL — ABNORMAL HIGH (ref 70–99)
Potassium: 5.1 mEq/L (ref 3.5–5.1)
Sodium: 140 mEq/L (ref 135–145)

## 2020-08-22 ENCOUNTER — Encounter: Payer: Self-pay | Admitting: Family Medicine

## 2020-08-22 DIAGNOSIS — G894 Chronic pain syndrome: Secondary | ICD-10-CM

## 2020-08-26 MED ORDER — PREGABALIN 75 MG PO CAPS: ORAL_CAPSULE | ORAL | 1 refills | Status: DC

## 2020-08-26 NOTE — Telephone Encounter (Signed)
Requesting: Lyrica  Contract: 04/26/2019 UDS: 04/26/2019 Last OV: 08/01/2020 Next OV: N/A Last Refill: 07/07/2020, #180---1 RF Database:   Please advise

## 2020-08-27 DIAGNOSIS — M5136 Other intervertebral disc degeneration, lumbar region: Secondary | ICD-10-CM | POA: Diagnosis not present

## 2020-08-27 DIAGNOSIS — M9903 Segmental and somatic dysfunction of lumbar region: Secondary | ICD-10-CM | POA: Diagnosis not present

## 2020-08-27 DIAGNOSIS — M9902 Segmental and somatic dysfunction of thoracic region: Secondary | ICD-10-CM | POA: Diagnosis not present

## 2020-08-27 DIAGNOSIS — M5134 Other intervertebral disc degeneration, thoracic region: Secondary | ICD-10-CM | POA: Diagnosis not present

## 2020-08-27 DIAGNOSIS — M5431 Sciatica, right side: Secondary | ICD-10-CM | POA: Diagnosis not present

## 2020-08-27 DIAGNOSIS — M9905 Segmental and somatic dysfunction of pelvic region: Secondary | ICD-10-CM | POA: Diagnosis not present

## 2020-09-03 ENCOUNTER — Other Ambulatory Visit: Payer: Self-pay | Admitting: Family Medicine

## 2020-09-17 DIAGNOSIS — M9902 Segmental and somatic dysfunction of thoracic region: Secondary | ICD-10-CM | POA: Diagnosis not present

## 2020-09-17 DIAGNOSIS — M5134 Other intervertebral disc degeneration, thoracic region: Secondary | ICD-10-CM | POA: Diagnosis not present

## 2020-09-17 DIAGNOSIS — M5431 Sciatica, right side: Secondary | ICD-10-CM | POA: Diagnosis not present

## 2020-09-17 DIAGNOSIS — M9903 Segmental and somatic dysfunction of lumbar region: Secondary | ICD-10-CM | POA: Diagnosis not present

## 2020-09-17 DIAGNOSIS — M9905 Segmental and somatic dysfunction of pelvic region: Secondary | ICD-10-CM | POA: Diagnosis not present

## 2020-09-17 DIAGNOSIS — M5136 Other intervertebral disc degeneration, lumbar region: Secondary | ICD-10-CM | POA: Diagnosis not present

## 2020-10-08 DIAGNOSIS — M5431 Sciatica, right side: Secondary | ICD-10-CM | POA: Diagnosis not present

## 2020-10-08 DIAGNOSIS — M9902 Segmental and somatic dysfunction of thoracic region: Secondary | ICD-10-CM | POA: Diagnosis not present

## 2020-10-08 DIAGNOSIS — M9903 Segmental and somatic dysfunction of lumbar region: Secondary | ICD-10-CM | POA: Diagnosis not present

## 2020-10-08 DIAGNOSIS — M5134 Other intervertebral disc degeneration, thoracic region: Secondary | ICD-10-CM | POA: Diagnosis not present

## 2020-10-08 DIAGNOSIS — M5136 Other intervertebral disc degeneration, lumbar region: Secondary | ICD-10-CM | POA: Diagnosis not present

## 2020-10-08 DIAGNOSIS — M9905 Segmental and somatic dysfunction of pelvic region: Secondary | ICD-10-CM | POA: Diagnosis not present

## 2020-10-19 ENCOUNTER — Encounter: Payer: Self-pay | Admitting: Family Medicine

## 2020-10-25 ENCOUNTER — Encounter: Payer: Self-pay | Admitting: Family Medicine

## 2020-10-25 ENCOUNTER — Other Ambulatory Visit: Payer: Self-pay | Admitting: Family Medicine

## 2020-10-25 DIAGNOSIS — J439 Emphysema, unspecified: Secondary | ICD-10-CM

## 2020-10-27 ENCOUNTER — Ambulatory Visit: Payer: Medicare PPO | Admitting: Family Medicine

## 2020-10-27 ENCOUNTER — Other Ambulatory Visit: Payer: Self-pay

## 2020-10-27 ENCOUNTER — Encounter: Payer: Self-pay | Admitting: Family Medicine

## 2020-10-27 VITALS — BP 162/96 | HR 86 | Temp 97.9°F | Ht 68.5 in | Wt 260.0 lb

## 2020-10-27 DIAGNOSIS — B3731 Acute candidiasis of vulva and vagina: Secondary | ICD-10-CM

## 2020-10-27 DIAGNOSIS — I10 Essential (primary) hypertension: Secondary | ICD-10-CM

## 2020-10-27 DIAGNOSIS — B373 Candidiasis of vulva and vagina: Secondary | ICD-10-CM

## 2020-10-27 MED ORDER — AMLODIPINE BESYLATE 10 MG PO TABS: 10.0000 mg | ORAL_TABLET | Freq: Every day | ORAL | 1 refills | Status: DC

## 2020-10-27 MED ORDER — FLUCONAZOLE 150 MG PO TABS: 150.0000 mg | ORAL_TABLET | ORAL | 0 refills | Status: DC

## 2020-10-27 NOTE — Patient Instructions (Signed)
Keep the diet clean and stay active.  Check your blood pressures 2-3 times per week, alternating the time of day you check it. If it is high, considering waiting 1-2 minutes and rechecking. If it gets higher, your anxiety is likely creeping up and we should avoid rechecking.   Take 2 tabs of your amlodipine until you run out, a new dosage has been sent in.  Let us know if you need anything.

## 2020-10-27 NOTE — Progress Notes (Signed)
Chief Complaint  Patient presents with  . yest infection     Vaginally and the calfs     [facility] is a 65 y.o. female here for vaginal discharge.  Duration: 1 week Description of discharge: [place] Odor: No Urinary complaints: No Denies fevers, bleeding, pregnancy abdominal pain.  Hypertension Patient presents for hypertension follow up. She does not monitor home blood pressures. She is compliant with medication- Norvasc 5 mg/d. Patient has these side effects of medication: none She is usually adhering to a healthy diet overall. Exercise: walking No Cp or SOB.   Past Medical History:  Diagnosis Date  . Adenomatous colon polyp   . Allergy   . C. difficile colitis   . Diverticulosis   . Hyperlipidemia   . Hypertension   . IBS (irritable bowel syndrome)   . Neuromuscular disorder (HCC)     BP (!) 162/96   Pulse 86   Temp 97.9 F (36.6 C) (Oral)   Ht 5' 8.5" (1.74 m)   Wt 260 lb (117.9 kg)   SpO2 96%   BMI 38.96 kg/m  Gen: Awake, alert, appears stated age Heart: RRR, no LE edema Lungs: CTAB, no accessory muscle use Abd: BS+, soft, NT, ND, no masses or organomegaly GU: declined Psych: Age appropriate judgment and insight, nml mood and affect  Candida vaginitis - Plan: fluconazole (DIFLUCAN) 150 MG tablet  Essential hypertension - Plan: amLODipine (NORVASC) 10 MG tablet  1. 3 doses of Diflucan q 72 hrs. 2. Chronic, uncontrolled. Increase Norvasc from 5 mg/d to 10 mg/d. Counseled on diet/exercise. Monitor BP at home. F/u w reg pcp in 1 mo to reck.  Pt voiced understanding and agreement to the plan.  [place], DO 10/27/20 12:07 PM

## 2020-10-29 DIAGNOSIS — M9903 Segmental and somatic dysfunction of lumbar region: Secondary | ICD-10-CM | POA: Diagnosis not present

## 2020-10-29 DIAGNOSIS — M5136 Other intervertebral disc degeneration, lumbar region: Secondary | ICD-10-CM | POA: Diagnosis not present

## 2020-10-29 DIAGNOSIS — M5431 Sciatica, right side: Secondary | ICD-10-CM | POA: Diagnosis not present

## 2020-10-29 DIAGNOSIS — M9902 Segmental and somatic dysfunction of thoracic region: Secondary | ICD-10-CM | POA: Diagnosis not present

## 2020-10-29 DIAGNOSIS — M5134 Other intervertebral disc degeneration, thoracic region: Secondary | ICD-10-CM | POA: Diagnosis not present

## 2020-10-29 DIAGNOSIS — M9905 Segmental and somatic dysfunction of pelvic region: Secondary | ICD-10-CM | POA: Diagnosis not present

## 2020-11-04 ENCOUNTER — Encounter: Payer: Self-pay | Admitting: Family Medicine

## 2020-11-04 ENCOUNTER — Other Ambulatory Visit: Payer: Self-pay

## 2020-11-04 ENCOUNTER — Telehealth (INDEPENDENT_AMBULATORY_CARE_PROVIDER_SITE_OTHER): Payer: Medicare PPO | Admitting: Family Medicine

## 2020-11-04 DIAGNOSIS — J329 Chronic sinusitis, unspecified: Secondary | ICD-10-CM | POA: Diagnosis not present

## 2020-11-04 DIAGNOSIS — J209 Acute bronchitis, unspecified: Secondary | ICD-10-CM | POA: Insufficient documentation

## 2020-11-04 DIAGNOSIS — J440 Chronic obstructive pulmonary disease with acute lower respiratory infection: Secondary | ICD-10-CM

## 2020-11-04 DIAGNOSIS — B3731 Acute candidiasis of vulva and vagina: Secondary | ICD-10-CM | POA: Insufficient documentation

## 2020-11-04 DIAGNOSIS — B373 Candidiasis of vulva and vagina: Secondary | ICD-10-CM

## 2020-11-04 DIAGNOSIS — B9689 Other specified bacterial agents as the cause of diseases classified elsewhere: Secondary | ICD-10-CM | POA: Insufficient documentation

## 2020-11-04 DIAGNOSIS — J40 Bronchitis, not specified as acute or chronic: Secondary | ICD-10-CM | POA: Insufficient documentation

## 2020-11-04 MED ORDER — PREDNISONE 10 MG PO TABS: ORAL_TABLET | ORAL | 0 refills | Status: DC

## 2020-11-04 MED ORDER — AZITHROMYCIN 250 MG PO TABS: ORAL_TABLET | ORAL | 0 refills | Status: DC

## 2020-11-04 MED ORDER — ALBUTEROL SULFATE HFA 108 (90 BASE) MCG/ACT IN AERS: 2.0000 | INHALATION_SPRAY | Freq: Four times a day (QID) | RESPIRATORY_TRACT | 0 refills | Status: DC | PRN

## 2020-11-04 MED ORDER — FLUCONAZOLE 150 MG PO TABS: 150.0000 mg | ORAL_TABLET | ORAL | 0 refills | Status: AC

## 2020-11-04 NOTE — Assessment & Plan Note (Signed)
abx per orders flonase -- pt has at home And restart antihistmine

## 2020-11-04 NOTE — Assessment & Plan Note (Signed)
con't inhalers z pak and pred taper

## 2020-11-04 NOTE — Progress Notes (Signed)
MyChart Video Visit    Virtual Visit via Video Note   This visit type was conducted due to national recommendations for restrictions regarding the COVID-19 Pandemic (e.g. social distancing) in an effort to limit this patient's exposure and mitigate transmission in our community. This patient is at least at moderate risk for complications without adequate follow up. This format is felt to be most appropriate for this patient at this time. Physical exam was limited by quality of the video and audio technology used for the visit. [name]  was able to get the patient set up on a video  visit.  Patient location: Home Patient and provider in visit Provider location: Office  I discussed the limitations of evaluation and management by telemedicine and the availability of in person appointments. The patient expressed understanding and agreed to proceed.  Visit Date: 11/04/2020  Today's healthcare provider: [name], DO     Subjective:    Patient ID: Veronica Solomon, female    DOB: [birth year], 65 y.o.   MRN: [id number]  Chief Complaint  Patient presents with  . Sinus Problem    HPI Patient is in today for a video visit. She c/o sinus pressure/ congestion , prod cough and wheezing    No fever -- + chills   She f/u on yeast as well -- it is better but is concerned with abx it will worsen again.    Pt did a home covid test that was normal    Past Medical History:  Diagnosis Date  . Adenomatous colon polyp   . Allergy   . C. difficile colitis   . Diverticulosis   . Hyperlipidemia   . Hypertension   . IBS (irritable bowel syndrome)   . Neuromuscular disorder [facility])     Past Surgical History:  Procedure Laterality Date  . ABDOMINAL HYSTERECTOMY  2002   fibroids    Family History  Problem Relation Age of Onset  . Hypertension Mother   . Depression Mother   . Heart attack Mother   . Uterine cancer Mother   . Hypertension Paternal Grandmother   . Diabetes  Maternal Grandmother   . Breast cancer Sister     Social History   Socioeconomic History  . Marital status: Single    Spouse name: Not on file  . Number of children: Not on file  . Years of education: Not on file  . Highest education level: Not on file  Occupational History  . Not on file  Tobacco Use  . Smoking status: Former Smoker    Packs/day: 1.00    Years: 42.00    Pack years: 42.00    Types: Cigarettes    Quit date: 12/29/2011    Years since quitting: 8.8  . Smokeless tobacco: Former Systems developer  . Tobacco comment: Quit 12/2011  Vaping Use  . Vaping Use: Never used  Substance and Sexual Activity  . Alcohol use: Yes    Comment: rarely  . Drug use: No  . Sexual activity: Not Currently  Other Topics Concern  . Not on file  Social History Narrative  . Not on file   Social Determinants of Health   Financial Resource Strain: Not on file  Food Insecurity: Not on file  Transportation Needs: Not on file  Physical Activity: Not on file  Stress: Not on file  Social Connections: Not on file  Intimate Partner Violence: Not on file    Outpatient Medications Prior to Visit  Medication Sig  Dispense Refill  . amLODipine (NORVASC) 10 MG tablet Take 1 tablet (10 mg total) by mouth daily. 90 tablet 1  . atorvastatin (LIPITOR) 20 MG tablet TAKE 1 TABLET(20 MG) BY MOUTH DAILY 90 tablet 1  . celecoxib (CELEBREX) 200 MG capsule Take 1 capsule (200 mg total) by mouth 2 (two) times daily as needed for moderate pain. 180 capsule 0  . DULoxetine (CYMBALTA) 60 MG capsule TAKE 2 CAPSULES BY MOUTH EVERY DAY 180 capsule 1  . Fluticasone-Umeclidin-Vilant (TRELEGY ELLIPTA) 100-62.5-25 MCG/INH AEPB Inhale 1 puff into the lungs daily. 60 each 2  . HYDROcodone-acetaminophen (NORCO) 10-325 MG tablet Take 1 tablet by mouth every 8 (eight) hours as needed. 30 tablet 0  . pregabalin (LYRICA) 75 MG capsule TAKE 1 CAPSULE(75 MG) BY MOUTH TWICE DAILY 180 capsule 1  . traMADol (ULTRAM) 50 MG tablet Take 1  tablet (50 mg total) by mouth every 6 (six) hours as needed. 90 tablet 0  . albuterol (VENTOLIN HFA) 108 (90 Base) MCG/ACT inhaler Inhale 2 puffs into the lungs every 6 (six) hours as needed for wheezing or shortness of breath. 8 g 0  . naproxen (NAPROSYN) 500 MG tablet Take 1 tablet (500 mg total) by mouth 2 (two) times daily with a meal. 60 tablet 1   No facility-administered medications prior to visit.    Allergies  Allergen Reactions  . Paregoric     REACTION: HIVES  . Penicillins     REACTION: RASH  . Procaine Hcl     Review of Systems  Constitutional: Positive for chills. Negative for fever.  HENT: Positive for congestion, sinus pain and sore throat.   Respiratory: Positive for cough, sputum production, shortness of breath and wheezing.        Objective:    Physical Exam Vitals and nursing note reviewed.  Constitutional:      Appearance: She is diaphoretic.  HENT:     Nose: Mucosal edema present. No nasal deformity.     Right Sinus: Maxillary sinus tenderness and frontal sinus tenderness present.     Left Sinus: Maxillary sinus tenderness and frontal sinus tenderness present.  Pulmonary:     Effort: Pulmonary effort is normal.  Musculoskeletal:     Cervical back: Neck supple.  Neurological:     Mental Status: She is alert and oriented to person, place, and time.  Psychiatric:        Mood and Affect: Mood normal.        Thought Content: Thought content normal.     There were no vitals taken for this visit. Wt Readings from Last 3 Encounters:  10/27/20 260 lb (117.9 kg)  08/01/20 257 lb 6.4 oz (116.8 kg)  07/10/20 256 lb (116.1 kg)    Diabetic Foot Exam - Simple   No data filed    Lab Results  Component Value Date   WBC 8.9 07/03/2015   HGB 13.8 07/03/2015   HCT 42.2 07/03/2015   PLT 262.0 07/03/2015   GLUCOSE 101 (H) 08/20/2020   CHOL 163 08/01/2020   TRIG 83 08/01/2020   HDL 67 08/01/2020   LDLDIRECT 195.7 01/12/2010   LDLCALC 79 08/01/2020    ALT 27 08/01/2020   AST 23 08/01/2020   NA 140 08/20/2020   K 5.1 08/20/2020   CL 103 08/20/2020   CREATININE 0.75 08/20/2020   BUN 15 08/20/2020   CO2 33 (H) 08/20/2020   TSH 1.13 01/26/2018   HGBA1C 5.8 12/30/2014   MICROALBUR 4.1 (H) 11/24/2012  Lab Results  Component Value Date   TSH 1.13 01/26/2018   Lab Results  Component Value Date   WBC 8.9 07/03/2015   HGB 13.8 07/03/2015   HCT 42.2 07/03/2015   MCV 86.4 07/03/2015   PLT 262.0 07/03/2015   Lab Results  Component Value Date   NA 140 08/20/2020   K 5.1 08/20/2020   CO2 33 (H) 08/20/2020   GLUCOSE 101 (H) 08/20/2020   BUN 15 08/20/2020   CREATININE 0.75 08/20/2020   BILITOT 0.6 08/01/2020   ALKPHOS 123 (H) 04/26/2019   AST 23 08/01/2020   ALT 27 08/01/2020   PROT 6.8 08/01/2020   ALBUMIN 4.7 04/26/2019   CALCIUM 10.1 08/20/2020   GFR 84.41 08/20/2020   Lab Results  Component Value Date   CHOL 163 08/01/2020   Lab Results  Component Value Date   HDL 67 08/01/2020   Lab Results  Component Value Date   LDLCALC 79 08/01/2020   Lab Results  Component Value Date   TRIG 83 08/01/2020   Lab Results  Component Value Date   CHOLHDL 2.4 08/01/2020   Lab Results  Component Value Date   HGBA1C 5.8 12/30/2014       Assessment & Plan:   Problem List Items Addressed This Visit      Unprioritized   Acute bronchitis with COPD ([place]) - Primary    con't inhalers z pak and pred taper        Relevant Medications   azithromycin (ZITHROMAX Z-PAK) 250 MG tablet   predniSONE (DELTASONE) 10 MG tablet   albuterol (VENTOLIN HFA) 108 (90 Base) MCG/ACT inhaler   Bronchitis   Relevant Medications   albuterol (VENTOLIN HFA) 108 (90 Base) MCG/ACT inhaler   Candida vaginitis    Refill diflucan to use if needed       Relevant Medications   azithromycin (ZITHROMAX Z-PAK) 250 MG tablet   fluconazole (DIFLUCAN) 150 MG tablet   Sinusitis, bacterial    abx per orders flonase -- pt has at home And restart  antihistmine       Relevant Medications   azithromycin (ZITHROMAX Z-PAK) 250 MG tablet   predniSONE (DELTASONE) 10 MG tablet   fluconazole (DIFLUCAN) 150 MG tablet       Meds ordered this encounter  Medications  . azithromycin (ZITHROMAX Z-PAK) 250 MG tablet    Sig: As directed    Dispense:  6 each    Refill:  0  . predniSONE (DELTASONE) 10 MG tablet    Sig: TAKE 3 TABLETS PO QD FOR 3 DAYS THEN TAKE 2 TABLETS PO QD FOR 3 DAYS THEN TAKE 1 TABLET PO QD FOR 3 DAYS THEN TAKE 1/2 TAB PO QD FOR 3 DAYS    Dispense:  20 tablet    Refill:  0  . fluconazole (DIFLUCAN) 150 MG tablet    Sig: Take 1 tablet (150 mg total) by mouth every 3 (three) days for 3 doses. Take 1 tab, repeat in 48 hours if no improvement.    Dispense:  3 tablet    Refill:  0  . albuterol (VENTOLIN HFA) 108 (90 Base) MCG/ACT inhaler    Sig: Inhale 2 puffs into the lungs every 6 (six) hours as needed for wheezing or shortness of breath.    Dispense:  8 g    Refill:  0    I discussed the assessment and treatment plan with the patient. The patient was provided an opportunity to ask questions and all were answered.  The patient agreed with the plan and demonstrated an understanding of the instructions.   The patient was advised to call back or seek an in-person evaluation if the symptoms worsen or if the condition fails to improve as anticipated.  I provided 20 minutes of face-to-face time during this encounter.   [name], DO [place] [facility] [phone] (phone) [phone] (fax)  [place]

## 2020-11-04 NOTE — Assessment & Plan Note (Signed)
Refill diflucan to use if needed

## 2020-11-12 ENCOUNTER — Other Ambulatory Visit: Payer: Self-pay | Admitting: Family Medicine

## 2020-11-12 DIAGNOSIS — G894 Chronic pain syndrome: Secondary | ICD-10-CM

## 2020-11-12 NOTE — Telephone Encounter (Signed)
Requesting: Tramadol  Contract: 04/26/2019 UDS: 04/26/2019 Last Visit: 11/04/2020- acute video visit Next Visit: 11/27/2020 Last Refill: 08/13/2020

## 2020-11-19 DIAGNOSIS — M5431 Sciatica, right side: Secondary | ICD-10-CM | POA: Diagnosis not present

## 2020-11-19 DIAGNOSIS — M9902 Segmental and somatic dysfunction of thoracic region: Secondary | ICD-10-CM | POA: Diagnosis not present

## 2020-11-19 DIAGNOSIS — M9903 Segmental and somatic dysfunction of lumbar region: Secondary | ICD-10-CM | POA: Diagnosis not present

## 2020-11-19 DIAGNOSIS — M5134 Other intervertebral disc degeneration, thoracic region: Secondary | ICD-10-CM | POA: Diagnosis not present

## 2020-11-19 DIAGNOSIS — M9905 Segmental and somatic dysfunction of pelvic region: Secondary | ICD-10-CM | POA: Diagnosis not present

## 2020-11-19 DIAGNOSIS — M5136 Other intervertebral disc degeneration, lumbar region: Secondary | ICD-10-CM | POA: Diagnosis not present

## 2020-11-24 ENCOUNTER — Telehealth (INDEPENDENT_AMBULATORY_CARE_PROVIDER_SITE_OTHER): Payer: Medicare PPO | Admitting: Family Medicine

## 2020-11-24 ENCOUNTER — Other Ambulatory Visit: Payer: Self-pay

## 2020-11-24 ENCOUNTER — Telehealth: Payer: Self-pay

## 2020-11-24 ENCOUNTER — Encounter: Payer: Self-pay | Admitting: Family Medicine

## 2020-11-24 DIAGNOSIS — U071 COVID-19: Secondary | ICD-10-CM | POA: Diagnosis not present

## 2020-11-24 MED ORDER — NIRMATRELVIR/RITONAVIR (PAXLOVID)TABLET: 3.0000 | ORAL_TABLET | Freq: Two times a day (BID) | ORAL | 0 refills | Status: AC

## 2020-11-24 NOTE — Telephone Encounter (Signed)
Pt called stating she is Covid+.  She is scheduled for a virtual visit with Thurnell Lose tomorrow evening.  In the meantime, she does have some questions related to her diagnosis. She would like a return call from office.

## 2020-11-24 NOTE — Assessment & Plan Note (Signed)
paxlovid Pt has her inhalers Call if symptoms worsen

## 2020-11-24 NOTE — Telephone Encounter (Signed)
Pt has been seen 

## 2020-11-24 NOTE — Progress Notes (Signed)
MyChart Video Visit    Virtual Visit via Video Note   This visit type was conducted due to national recommendations for restrictions regarding the COVID-19 Pandemic (e.g. social distancing) in an effort to limit this patient's exposure and mitigate transmission in our community. This patient is at least at moderate risk for complications without adequate follow up. This format is felt to be most appropriate for this patient at this time. Physical exam was limited by quality of the video and audio technology used for the visit. Alinda Dooms was able to get the patient set up on a video visit.  Patient location: Home Patient and provider in visit Provider location: Office  I discussed the limitations of evaluation and management by telemedicine and the availability of in person appointments. The patient expressed understanding and agreed to proceed.  Visit Date: 11/24/2020  Today's healthcare provider: [name], DO     Subjective:    Patient ID: Veronica Solomon, female    DOB: 1956/05/15, 65 y.o.   MRN: [id number]  Chief Complaint  Patient presents with   Covid Positive    HPI Patient is in today for + covid test -- home test on Friday   pt started with headache on Thursday night / Friday am.  They headache con't -- + sweats, chills , bodyaches,  low grade fever   100.2,  coughing --it is keeping her awake     pt feels weak.  She is not taking anything   Past Medical History:  Diagnosis Date   Adenomatous colon polyp    Allergy    C. difficile colitis    Diverticulosis    Hyperlipidemia    Hypertension    IBS (irritable bowel syndrome)    Neuromuscular disorder ([place])    Past Surgical History:  Procedure Laterality Date   ABDOMINAL HYSTERECTOMY  2002   fibroids    Family History  Problem Relation Age of Onset   Hypertension Mother    Depression Mother    Heart attack Mother    Uterine cancer Mother    Hypertension Paternal Grandmother     Diabetes Maternal Grandmother    Breast cancer Sister     Social History   Socioeconomic History   Marital status: Single    Spouse name: Not on file   Number of children: Not on file   Years of education: Not on file   Highest education level: Not on file  Occupational History   Not on file  Tobacco Use   Smoking status: Former    Packs/day: 1.00    Years: 42.00    Pack years: 42.00    Types: Cigarettes    Quit date: 12/29/2011    Years since quitting: 8.9   Smokeless tobacco: Former   Tobacco comments:    Quit 12/2011  Vaping Use   Vaping Use: Never used  Substance and Sexual Activity   Alcohol use: Yes    Comment: rarely   Drug use: No   Sexual activity: Not Currently  Other Topics Concern   Not on file  Social History Narrative   Not on file   Social Determinants of Health   Financial Resource Strain: Not on file  Food Insecurity: Not on file  Transportation Needs: Not on file  Physical Activity: Not on file  Stress: Not on file  Social Connections: Not on file  Intimate Partner Violence: Not on file    Outpatient Medications Prior to Visit  Medication  Sig Dispense Refill   albuterol (VENTOLIN HFA) 108 (90 Base) MCG/ACT inhaler Inhale 2 puffs into the lungs every 6 (six) hours as needed for wheezing or shortness of breath. 8 g 0   amLODipine (NORVASC) 10 MG tablet Take 1 tablet (10 mg total) by mouth daily. 90 tablet 1   atorvastatin (LIPITOR) 20 MG tablet TAKE 1 TABLET(20 MG) BY MOUTH DAILY 90 tablet 1   celecoxib (CELEBREX) 200 MG capsule Take 1 capsule (200 mg total) by mouth 2 (two) times daily as needed for moderate pain. 180 capsule 0   DULoxetine (CYMBALTA) 60 MG capsule TAKE 2 CAPSULES BY MOUTH EVERY DAY 180 capsule 1   Fluticasone-Umeclidin-Vilant (TRELEGY ELLIPTA) 100-62.5-25 MCG/INH AEPB Inhale 1 puff into the lungs daily. 60 each 2   HYDROcodone-acetaminophen (NORCO) 10-325 MG tablet Take 1 tablet by mouth every 8 (eight) hours as needed. 30  tablet 0   pregabalin (LYRICA) 75 MG capsule TAKE 1 CAPSULE(75 MG) BY MOUTH TWICE DAILY 180 capsule 1   traMADol (ULTRAM) 50 MG tablet TAKE 1 TABLET(50 MG) BY MOUTH EVERY 6 HOURS AS NEEDED 90 tablet 0   predniSONE (DELTASONE) 10 MG tablet TAKE 3 TABLETS PO QD FOR 3 DAYS THEN TAKE 2 TABLETS PO QD FOR 3 DAYS THEN TAKE 1 TABLET PO QD FOR 3 DAYS THEN TAKE 1/2 TAB PO QD FOR 3 DAYS 20 tablet 0   azithromycin (ZITHROMAX Z-PAK) 250 MG tablet As directed (Patient not taking: Reported on 11/24/2020) 6 each 0   No facility-administered medications prior to visit.    Allergies  Allergen Reactions   Paregoric     REACTION: HIVES   Penicillins     REACTION: RASH   Procaine Hcl     Review of Systems  Constitutional:  Positive for chills, fever and malaise/fatigue.  HENT:  Negative for ear discharge and ear pain.   Eyes:  Negative for pain, discharge and redness.  Respiratory:  Positive for cough. Negative for sputum production.   Gastrointestinal:  Negative for blood in stool.  Musculoskeletal:  Positive for myalgias.  Neurological:  Negative for tingling, tremors, focal weakness and seizures.      Objective:    Physical Exam Constitutional:      General: She is not in acute distress.    Appearance: Normal appearance.  Eyes:     Extraocular Movements: Extraocular movements intact.     Pupils: Pupils are equal, round, and reactive to light.  Musculoskeletal:        General: Normal range of motion.     Cervical back: Normal range of motion.  Neurological:     Mental Status: She is alert.  Psychiatric:        Behavior: Behavior normal.        Thought Content: Thought content normal.    There were no vitals taken for this visit. Wt Readings from Last 3 Encounters:  10/27/20 260 lb (117.9 kg)  08/01/20 257 lb 6.4 oz (116.8 kg)  07/10/20 256 lb (116.1 kg)    Diabetic Foot Exam - Simple   No data filed    Lab Results  Component Value Date   WBC 8.9 07/03/2015   HGB 13.8  07/03/2015   HCT 42.2 07/03/2015   PLT 262.0 07/03/2015   GLUCOSE 101 (H) 08/20/2020   CHOL 163 08/01/2020   TRIG 83 08/01/2020   HDL 67 08/01/2020   LDLDIRECT 195.7 01/12/2010   LDLCALC 79 08/01/2020   ALT 27 08/01/2020   AST 23 08/01/2020  NA 140 08/20/2020   K 5.1 08/20/2020   CL 103 08/20/2020   CREATININE 0.75 08/20/2020   BUN 15 08/20/2020   CO2 33 (H) 08/20/2020   TSH 1.13 01/26/2018   HGBA1C 5.8 12/30/2014   MICROALBUR 4.1 (H) 11/24/2012    Lab Results  Component Value Date   TSH 1.13 01/26/2018   Lab Results  Component Value Date   WBC 8.9 07/03/2015   HGB 13.8 07/03/2015   HCT 42.2 07/03/2015   MCV 86.4 07/03/2015   PLT 262.0 07/03/2015   Lab Results  Component Value Date   NA 140 08/20/2020   K 5.1 08/20/2020   CO2 33 (H) 08/20/2020   GLUCOSE 101 (H) 08/20/2020   BUN 15 08/20/2020   CREATININE 0.75 08/20/2020   BILITOT 0.6 08/01/2020   ALKPHOS 123 (H) 04/26/2019   AST 23 08/01/2020   ALT 27 08/01/2020   PROT 6.8 08/01/2020   ALBUMIN 4.7 04/26/2019   CALCIUM 10.1 08/20/2020   GFR 84.41 08/20/2020   Lab Results  Component Value Date   CHOL 163 08/01/2020   Lab Results  Component Value Date   HDL 67 08/01/2020   Lab Results  Component Value Date   LDLCALC 79 08/01/2020   Lab Results  Component Value Date   TRIG 83 08/01/2020   Lab Results  Component Value Date   CHOLHDL 2.4 08/01/2020   Lab Results  Component Value Date   HGBA1C 5.8 12/30/2014       Assessment & Plan:   Problem List Items Addressed This Visit       Unprioritized   COVID-19 - Primary    paxlovid Pt has her inhalers Call if symptoms worsen       Relevant Medications   nirmatrelvir/ritonavir EUA (PAXLOVID) TABS      Meds ordered this encounter  Medications   nirmatrelvir/ritonavir EUA (PAXLOVID) TABS    Sig: Take 3 tablets by mouth 2 (two) times daily for 5 days. (Take nirmatrelvir 150 mg two tablets twice daily for 5 days and ritonavir 100 mg  one tablet twice daily for 5 days) Patient GFR is 84.4    Dispense:  30 tablet    Refill:  0    I discussed the assessment and treatment plan with the patient. The patient was provided an opportunity to ask questions and all were answered. The patient agreed with the plan and demonstrated an understanding of the instructions.   The patient was advised to call back or seek an in-person evaluation if the symptoms worsen or if the condition fails to improve as anticipated.   [name], DO [facility] [phone] (phone) [phone] (fax)  [place]

## 2020-11-24 NOTE — Telephone Encounter (Signed)
Pt would like to know what she can be taking for her sxs in the meantime for her sxs. She is having achyness/ weakness, congestion, a productive cough, and a low grade fever.   She also asks how long will she be contagious? Her sxs onset was late Thursday evening.

## 2020-11-25 ENCOUNTER — Telehealth: Payer: Medicare PPO | Admitting: Family Medicine

## 2020-11-27 ENCOUNTER — Other Ambulatory Visit: Payer: Self-pay | Admitting: Family Medicine

## 2020-11-27 ENCOUNTER — Ambulatory Visit: Payer: Medicare PPO | Admitting: Family Medicine

## 2020-11-27 DIAGNOSIS — J40 Bronchitis, not specified as acute or chronic: Secondary | ICD-10-CM

## 2020-12-04 ENCOUNTER — Other Ambulatory Visit: Payer: Self-pay

## 2020-12-04 ENCOUNTER — Ambulatory Visit: Payer: Medicare PPO | Admitting: Family Medicine

## 2020-12-04 ENCOUNTER — Encounter: Payer: Self-pay | Admitting: Family Medicine

## 2020-12-04 VITALS — BP 142/80 | HR 91 | Temp 98.0°F | Resp 18 | Ht 68.5 in | Wt 260.0 lb

## 2020-12-04 DIAGNOSIS — L918 Other hypertrophic disorders of the skin: Secondary | ICD-10-CM

## 2020-12-04 NOTE — Patient Instructions (Signed)
Skin Tag, Adult  A skin tag (acrochordon) is a soft, extra growth of skin. Most skin tags are skin-colored and rarely bigger than a pencil eraser. They commonly form in areas where there is frequent rubbing, or friction, on the skin. This may be where there are folds in the skin, such as the eyelids, neck, armpit, or groin. Skin tags are not dangerous, and they do not spread from person to person (are not contagious). You may have one skin tag or several. Skin tags do not require treatment. However, your health care provider may recommend removal of a skin tag if it: Gets irritated from clothing or jewelry. Bleeds. Is visible and unsightly. What are the causes? This condition is linked with: Increasing age. Pregnancy. Diabetes. Obesity. What are the signs or symptoms? Skin tags usually do not cause symptoms unless they get irritated by items touching your skin, such as clothing or jewelry. When this happens, you may have pain, itching, or bleeding. How is this diagnosed? This condition is diagnosed with an evaluation from your health care provider. No testing is needed for diagnosis. How is this treated? Treatment for this condition depends on whether you have symptoms. If a skin tag needs to be removed, your health care provider can remove it with: A simple surgical procedure using scissors. A procedure that involves freezing your skin tag with a gas in liquid form (liquid nitrogen). A procedure that uses heat to destroy your skin tag (electrodessication). Your health care provider may also remove your skin tag if it is visible or unsightly, Follow these instructions at home: Watch for any changes in your skin tag. A normal skin tag does not require any other special care at home. Take over-the-counter and prescription medicines only as told by your health care provider. Keep all follow-up visits as told by your health care provider. This is important. Contact a health care provider  if: You have a skin tag that: Becomes painful. Changes color. Bleeds. Swells. Summary Skin tags are soft, extra growths of skin found in areas of frequent rubbing or friction. Skin tags usually do not cause symptoms. If symptoms occur, you may have pain, itching, or bleeding. If your skin tag causes symptoms or is unsightly, your health care provider can remove it. This information is not intended to replace advice given to you by your health care provider. Make sure you discuss any questions you have with your health care provider. Document Revised: 04/02/2019 Document Reviewed: 04/02/2019 Elsevier Patient Education  2022 Elsevier Inc.  

## 2020-12-04 NOTE — Progress Notes (Signed)
Subjective:   By signing my name below, I, [name], attest that this documentation has been prepared under the direction and in the presence of Dr. Roma Schanz, DO. 12/04/2020    Patient ID: Veronica Solomon, female    DOB: 08-[name]-1956, 65 y.o.   MRN: [id number]  Chief Complaint  Patient presents with  . Skin Tag    Removal- right under the left breast.     HPI Patient is in today for a office visit to remove her skin tag under her left breast. She notes that before the began procedure the skin tag had shrunken slightly.    Past Medical History:  Diagnosis Date  . Adenomatous colon polyp   . Allergy   . C. difficile colitis   . Diverticulosis   . Hyperlipidemia   . Hypertension   . IBS (irritable bowel syndrome)   . Neuromuscular disorder [facility])     Past Surgical History:  Procedure Laterality Date  . ABDOMINAL HYSTERECTOMY  2002   fibroids    Family History  Problem Relation Age of Onset  . Hypertension Mother   . Depression Mother   . Heart attack Mother   . Uterine cancer Mother   . Hypertension Paternal Grandmother   . Diabetes Maternal Grandmother   . Breast cancer Sister     Social History   Socioeconomic History  . Marital status: Single    Spouse name: Not on file  . Number of children: Not on file  . Years of education: Not on file  . Highest education level: Not on file  Occupational History  . Not on file  Tobacco Use  . Smoking status: Former    Packs/day: 1.00    Years: 42.00    Pack years: 42.00    Types: Cigarettes    Quit date: 12/29/2011    Years since quitting: 8.9  . Smokeless tobacco: Former  . Tobacco comments:    Quit 12/2011  Vaping Use  . Vaping Use: Never used  Substance and Sexual Activity  . Alcohol use: Yes    Comment: rarely  . Drug use: No  . Sexual activity: Not Currently  Other Topics Concern  . Not on file  Social History Narrative  . Not on file   Social Determinants of Health    Financial Resource Strain: Not on file  Food Insecurity: Not on file  Transportation Needs: Not on file  Physical Activity: Not on file  Stress: Not on file  Social Connections: Not on file  Intimate Partner Violence: Not on file    Outpatient Medications Prior to Visit  Medication Sig Dispense Refill  . albuterol (VENTOLIN HFA) 108 (90 Base) MCG/ACT inhaler Inhale 2 puffs into the lungs every 6 (six) hours as needed for wheezing or shortness of breath. 18 g 5  . amLODipine (NORVASC) 10 MG tablet Take 1 tablet (10 mg total) by mouth daily. 90 tablet 1  . atorvastatin (LIPITOR) 20 MG tablet TAKE 1 TABLET(20 MG) BY MOUTH DAILY 90 tablet 1  . celecoxib (CELEBREX) 200 MG capsule Take 1 capsule (200 mg total) by mouth 2 (two) times daily as needed for moderate pain. 180 capsule 0  . DULoxetine (CYMBALTA) 60 MG capsule TAKE 2 CAPSULES BY MOUTH EVERY DAY 180 capsule 1  . Fluticasone-Umeclidin-Vilant (TRELEGY ELLIPTA) 100-62.5-25 MCG/INH AEPB Inhale 1 puff into the lungs daily. 60 each 2  . HYDROcodone-acetaminophen (NORCO) 10-325 MG tablet Take 1 tablet by mouth every 8 (eight) hours as  needed. 30 tablet 0  . pregabalin (LYRICA) 75 MG capsule TAKE 1 CAPSULE(75 MG) BY MOUTH TWICE DAILY 180 capsule 1  . traMADol (ULTRAM) 50 MG tablet TAKE 1 TABLET(50 MG) BY MOUTH EVERY 6 HOURS AS NEEDED 90 tablet 0   No facility-administered medications prior to visit.    Allergies  Allergen Reactions  . Paregoric     REACTION: HIVES  . Penicillins     REACTION: RASH  . Procaine Hcl     Review of Systems  Constitutional:  Negative for fever and malaise/fatigue.  HENT:  Negative for congestion.   Eyes:  Negative for blurred vision.  Respiratory:  Negative for cough and shortness of breath.   Cardiovascular:  Negative for chest pain, palpitations and leg swelling.  Gastrointestinal:  Negative for vomiting.  Musculoskeletal:  Negative for back pain.  Skin:  Negative for rash.       (-)skin tag under  left breast  Neurological:  Negative for loss of consciousness and headaches.      Objective:    Physical Exam Vitals and nursing note reviewed.  Constitutional:      General: She is not in acute distress.    Appearance: Normal appearance. She is not ill-appearing.  HENT:     Head: Normocephalic and atraumatic.     Right Ear: External ear normal.     Left Ear: External ear normal.  Eyes:     Extraocular Movements: Extraocular movements intact.     Pupils: Pupils are equal, round, and reactive to light.  Cardiovascular:     Rate and Rhythm: Normal rate and regular rhythm.     Pulses: Normal pulses.     Heart sounds: Normal heart sounds. No murmur heard.   No gallop.  Pulmonary:     Effort: Pulmonary effort is normal. No respiratory distress.     Breath sounds: Normal breath sounds. No wheezing, rhonchi or rales.  Skin:    General: Skin is warm and dry.     Comments: Small 0.25 cm skin tag underneath left breast.  Area was cleaned and prepped with alcohol swab. Anesthetized with less than cc Xylocaine with epi. Area was wiped down with betadine. Scalpel with forceps was used for shave biopsy. Bandaid was applied after using silver nitrate to stop bleeding  Neurological:     Mental Status: She is alert and oriented to person, place, and time.  Psychiatric:        Behavior: Behavior normal.    BP (!) 142/80 (BP Location: Left Arm, Patient Position: Sitting, Cuff Size: Large)   Pulse 91   Temp 98 F (36.7 C) (Oral)   Resp 18   Ht 5' 8.5" (1.74 m)   Wt 260 lb (117.9 kg)   SpO2 96%   BMI 38.96 kg/m  Wt Readings from Last 3 Encounters:  12/04/20 260 lb (117.9 kg)  10/27/20 260 lb (117.9 kg)  08/01/20 257 lb 6.4 oz (116.8 kg)    Diabetic Foot Exam - Simple   No data filed    Lab Results  Component Value Date   WBC 8.9 07/03/2015   HGB 13.8 07/03/2015   HCT 42.2 07/03/2015   PLT 262.0 07/03/2015   GLUCOSE 101 (H) 08/20/2020   CHOL 163 08/01/2020   TRIG 83  08/01/2020   HDL 67 08/01/2020   LDLDIRECT 195.7 01/12/2010   LDLCALC 79 08/01/2020   ALT 27 08/01/2020   AST 23 08/01/2020   NA 140 08/20/2020   K 5.1 08/20/2020  CL 103 08/20/2020   CREATININE 0.75 08/20/2020   BUN 15 08/20/2020   CO2 33 (H) 08/20/2020   TSH 1.13 01/26/2018   HGBA1C 5.8 12/30/2014   MICROALBUR 4.1 (H) 11/24/2012    Lab Results  Component Value Date   TSH 1.13 01/26/2018   Lab Results  Component Value Date   WBC 8.9 07/03/2015   HGB 13.8 07/03/2015   HCT 42.2 07/03/2015   MCV 86.4 07/03/2015   PLT 262.0 07/03/2015   Lab Results  Component Value Date   NA 140 08/20/2020   K 5.1 08/20/2020   CO2 33 (H) 08/20/2020   GLUCOSE 101 (H) 08/20/2020   BUN 15 08/20/2020   CREATININE 0.75 08/20/2020   BILITOT 0.6 08/01/2020   ALKPHOS 123 (H) 04/26/2019   AST 23 08/01/2020   ALT 27 08/01/2020   PROT 6.8 08/01/2020   ALBUMIN 4.7 04/26/2019   CALCIUM 10.1 08/20/2020   GFR 84.41 08/20/2020   Lab Results  Component Value Date   CHOL 163 08/01/2020   Lab Results  Component Value Date   HDL 67 08/01/2020   Lab Results  Component Value Date   LDLCALC 79 08/01/2020   Lab Results  Component Value Date   TRIG 83 08/01/2020   Lab Results  Component Value Date   CHOLHDL 2.4 08/01/2020   Lab Results  Component Value Date   HGBA1C 5.8 12/30/2014       Assessment & Plan:   Problem List Items Addressed This Visit   None Visit Diagnoses     Skin tag    -  Primary        No orders of the defined types were placed in this encounter.   I, Dr. Roma Schanz, DO, personally preformed the services described in this documentation.  All medical record entries made by the scribe were at my direction and in my presence.  I have reviewed the chart and discharge instructions (if applicable) and agree that the record reflects my personal performance and is accurate and complete. 12/04/2020   I,[name],acting as a scribe for [employer], DO.,have documented all relevant documentation on the behalf of [name], DO,as directed by  [name], DO while in the presence of [name], DO.   [name], DO

## 2020-12-04 NOTE — Assessment & Plan Note (Addendum)
Keep area dry 24 hours than she can shower as usual  rto If any signs of infection etc

## 2020-12-08 DIAGNOSIS — M9905 Segmental and somatic dysfunction of pelvic region: Secondary | ICD-10-CM | POA: Diagnosis not present

## 2020-12-08 DIAGNOSIS — M9903 Segmental and somatic dysfunction of lumbar region: Secondary | ICD-10-CM | POA: Diagnosis not present

## 2020-12-08 DIAGNOSIS — M5136 Other intervertebral disc degeneration, lumbar region: Secondary | ICD-10-CM | POA: Diagnosis not present

## 2020-12-08 DIAGNOSIS — M9902 Segmental and somatic dysfunction of thoracic region: Secondary | ICD-10-CM | POA: Diagnosis not present

## 2020-12-08 DIAGNOSIS — M5134 Other intervertebral disc degeneration, thoracic region: Secondary | ICD-10-CM | POA: Diagnosis not present

## 2020-12-08 DIAGNOSIS — M5431 Sciatica, right side: Secondary | ICD-10-CM | POA: Diagnosis not present

## 2020-12-18 ENCOUNTER — Encounter: Payer: Self-pay | Admitting: Family Medicine

## 2020-12-18 ENCOUNTER — Ambulatory Visit: Payer: Medicare PPO | Admitting: Family Medicine

## 2020-12-18 ENCOUNTER — Other Ambulatory Visit: Payer: Self-pay

## 2020-12-18 VITALS — BP 148/80 | HR 90 | Temp 97.8°F | Resp 18 | Ht 68.5 in | Wt 261.0 lb

## 2020-12-18 DIAGNOSIS — J42 Unspecified chronic bronchitis: Secondary | ICD-10-CM | POA: Insufficient documentation

## 2020-12-18 DIAGNOSIS — Z8616 Personal history of COVID-19: Secondary | ICD-10-CM | POA: Diagnosis not present

## 2020-12-18 DIAGNOSIS — W57XXXA Bitten or stung by nonvenomous insect and other nonvenomous arthropods, initial encounter: Secondary | ICD-10-CM | POA: Diagnosis not present

## 2020-12-18 DIAGNOSIS — E669 Obesity, unspecified: Secondary | ICD-10-CM | POA: Diagnosis not present

## 2020-12-18 DIAGNOSIS — S80861A Insect bite (nonvenomous), right lower leg, initial encounter: Secondary | ICD-10-CM | POA: Diagnosis not present

## 2020-12-18 DIAGNOSIS — F320 Major depressive disorder, single episode, mild: Secondary | ICD-10-CM | POA: Diagnosis not present

## 2020-12-18 DIAGNOSIS — I10 Essential (primary) hypertension: Secondary | ICD-10-CM | POA: Diagnosis not present

## 2020-12-18 MED ORDER — TRIAMCINOLONE ACETONIDE 0.1 % EX CREA: 1.0000 "application " | TOPICAL_CREAM | Freq: Two times a day (BID) | CUTANEOUS | 0 refills | Status: DC

## 2020-12-18 MED ORDER — HYDROCHLOROTHIAZIDE 25 MG PO TABS: 25.0000 mg | ORAL_TABLET | Freq: Every day | ORAL | 3 refills | Status: DC

## 2020-12-18 NOTE — Assessment & Plan Note (Signed)
con't inhalers Pt c/o worsening sob since having covid ------ refer to pulmonary

## 2020-12-18 NOTE — Progress Notes (Signed)
Subjective:   By signing my name below, I, Veronica Solomon, attest that this documentation has been prepared under the direction and in the presence of Dr. Roma Schanz, DO. 12/18/2020    Patient ID: Veronica Solomon, female    DOB: 1955-10-22, 65 y.o.   MRN: [id number]  Chief Complaint  Patient presents with   Hypertension   Follow-up    Hypertension Associated symptoms include shortness of breath. Pertinent negatives include no chest pain, headaches, malaise/fatigue or palpitations.  Patient is in today for a office visit-- to f/u bp but c/o con't sob since having covid in June.   Her blood pressure has not improved or worsened since her last visit. She does not measure her blood pressure at home. She continues taking 10 mg amlodipine daily PO. She reports not taking any other blood pressure medication prior to this one. She has lower extremity edema in both legs. She denies having any chest pain at this time. BP Readings from Last 3 Encounters:  12/18/20 (!) 148/80  12/04/20 (!) 142/80  10/27/20 (!) 162/96   She also complains of constant SOB since having Covid-19 in June, 2022. She does not see a pulmonologist at this time and is interested in setting up an appointment with one to help manage her symptoms.  She also complains of a itching bug bite behind her right knee for the past week. The area around the bite is red and irritated. Her symptoms have improved since the onset. She put neosporin to manage her symptoms but does not put a bandage on it due to the location of the bite.  Past Medical History:  Diagnosis Date   Adenomatous colon polyp    Allergy    C. difficile colitis    Diverticulosis    Hyperlipidemia    Hypertension    IBS (irritable bowel syndrome)    Neuromuscular disorder ([place])     Past Surgical History:  Procedure Laterality Date   ABDOMINAL HYSTERECTOMY  2002   fibroids    Family History  Problem Relation Age of Onset   Hypertension  Mother    Depression Mother    Heart attack Mother    Uterine cancer Mother    Hypertension Paternal Grandmother    Diabetes Maternal Grandmother    Breast cancer Sister     Social History   Socioeconomic History   Marital status: Single    Spouse name: Not on file   Number of children: Not on file   Years of education: Not on file   Highest education level: Not on file  Occupational History   Not on file  Tobacco Use   Smoking status: Former    Packs/day: 1.00    Years: 42.00    Pack years: 42.00    Types: Cigarettes    Quit date: 12/29/2011    Years since quitting: 8.9   Smokeless tobacco: Former   Tobacco comments:    Quit 12/2011  Vaping Use   Vaping Use: Never used  Substance and Sexual Activity   Alcohol use: Yes    Comment: rarely   Drug use: No   Sexual activity: Not Currently  Other Topics Concern   Not on file  Social History Narrative   Not on file   Social Determinants of Health   Financial Resource Strain: Not on file  Food Insecurity: Not on file  Transportation Needs: Not on file  Physical Activity: Not on file  Stress: Not on file  Social Connections:  Not on file  Intimate Partner Violence: Not on file    Outpatient Medications Prior to Visit  Medication Sig Dispense Refill   albuterol (VENTOLIN HFA) 108 (90 Base) MCG/ACT inhaler Inhale 2 puffs into the lungs every 6 (six) hours as needed for wheezing or shortness of breath. 18 g 5   amLODipine (NORVASC) 10 MG tablet Take 1 tablet (10 mg total) by mouth daily. 90 tablet 1   atorvastatin (LIPITOR) 20 MG tablet TAKE 1 TABLET(20 MG) BY MOUTH DAILY 90 tablet 1   celecoxib (CELEBREX) 200 MG capsule Take 1 capsule (200 mg total) by mouth 2 (two) times daily as needed for moderate pain. 180 capsule 0   DULoxetine (CYMBALTA) 60 MG capsule TAKE 2 CAPSULES BY MOUTH EVERY DAY 180 capsule 1   Fluticasone-Umeclidin-Vilant (TRELEGY ELLIPTA) 100-62.5-25 MCG/INH AEPB Inhale 1 puff into the lungs daily. 60  each 2   HYDROcodone-acetaminophen (NORCO) 10-325 MG tablet Take 1 tablet by mouth every 8 (eight) hours as needed. 30 tablet 0   pregabalin (LYRICA) 75 MG capsule TAKE 1 CAPSULE(75 MG) BY MOUTH TWICE DAILY 180 capsule 1   traMADol (ULTRAM) 50 MG tablet TAKE 1 TABLET(50 MG) BY MOUTH EVERY 6 HOURS AS NEEDED 90 tablet 0   No facility-administered medications prior to visit.    Allergies  Allergen Reactions   Paregoric     REACTION: HIVES   Penicillins     REACTION: RASH   Procaine Hcl     Review of Systems  Constitutional:  Negative for chills, fever and malaise/fatigue.  HENT:  Negative for congestion and hearing loss.   Eyes:  Negative for discharge.  Respiratory:  Positive for shortness of breath. Negative for cough and sputum production.   Cardiovascular:  Positive for leg swelling (Bilateral). Negative for chest pain and palpitations.  Gastrointestinal:  Negative for abdominal pain, blood in stool, constipation, diarrhea, heartburn, nausea and vomiting.  Genitourinary:  Negative for dysuria, frequency, hematuria and urgency.  Musculoskeletal:  Negative for back pain, falls and myalgias.  Skin:  Positive for itching (Behind right knee) and rash (behind right knee).  Neurological:  Negative for dizziness, sensory change, loss of consciousness, weakness and headaches.  Endo/Heme/Allergies:  Negative for environmental allergies. Does not bruise/bleed easily.  Psychiatric/Behavioral:  Negative for depression and suicidal ideas. The patient is not nervous/anxious and does not have insomnia.       Objective:    Physical Exam Vitals and nursing note reviewed.  Constitutional:      General: She is not in acute distress.    Appearance: Normal appearance. She is not ill-appearing.  HENT:     Head: Normocephalic and atraumatic.     Right Ear: External ear normal.     Left Ear: External ear normal.  Eyes:     Extraocular Movements: Extraocular movements intact.     Pupils: Pupils  are equal, round, and reactive to light.  Cardiovascular:     Rate and Rhythm: Normal rate and regular rhythm.     Pulses: Normal pulses.     Heart sounds: Normal heart sounds. No murmur heard.   No gallop.  Pulmonary:     Effort: Pulmonary effort is normal. No respiratory distress.     Breath sounds: Normal breath sounds. No wheezing, rhonchi or rales.  Musculoskeletal:     Right lower leg: 1+ Pitting Edema present.     Left lower leg: 1+ Pitting Edema present.  Skin:    General: Skin is warm and dry.  Comments: Insect bite behind right knee with surrounding erythema.  Neurological:     Mental Status: She is alert and oriented to person, place, and time.  Psychiatric:        Behavior: Behavior normal.    BP (!) 148/80 (BP Location: Left Arm, Patient Position: Sitting, Cuff Size: Large)   Pulse 90   Temp 97.8 F (36.6 C) (Oral)   Resp 18   Ht 5' 8.5" (1.74 m)   Wt 261 lb (118.4 kg)   SpO2 94%   BMI 39.11 kg/m  Wt Readings from Last 3 Encounters:  12/18/20 261 lb (118.4 kg)  12/04/20 260 lb (117.9 kg)  10/27/20 260 lb (117.9 kg)    Diabetic Foot Exam - Simple   No data filed    Lab Results  Component Value Date   WBC 8.9 07/03/2015   HGB 13.8 07/03/2015   HCT 42.2 07/03/2015   PLT 262.0 07/03/2015   GLUCOSE 101 (H) 08/20/2020   CHOL 163 08/01/2020   TRIG 83 08/01/2020   HDL 67 08/01/2020   LDLDIRECT 195.7 01/12/2010   LDLCALC 79 08/01/2020   ALT 27 08/01/2020   AST 23 08/01/2020   NA 140 08/20/2020   K 5.1 08/20/2020   CL 103 08/20/2020   CREATININE 0.75 08/20/2020   BUN 15 08/20/2020   CO2 33 (H) 08/20/2020   TSH 1.13 01/26/2018   HGBA1C 5.8 12/30/2014   MICROALBUR 4.1 (H) 11/24/2012    Lab Results  Component Value Date   TSH 1.13 01/26/2018   Lab Results  Component Value Date   WBC 8.9 07/03/2015   HGB 13.8 07/03/2015   HCT 42.2 07/03/2015   MCV 86.4 07/03/2015   PLT 262.0 07/03/2015   Lab Results  Component Value Date   NA 140  08/20/2020   K 5.1 08/20/2020   CO2 33 (H) 08/20/2020   GLUCOSE 101 (H) 08/20/2020   BUN 15 08/20/2020   CREATININE 0.75 08/20/2020   BILITOT 0.6 08/01/2020   ALKPHOS 123 (H) 04/26/2019   AST 23 08/01/2020   ALT 27 08/01/2020   PROT 6.8 08/01/2020   ALBUMIN 4.7 04/26/2019   CALCIUM 10.1 08/20/2020   GFR 84.41 08/20/2020   Lab Results  Component Value Date   CHOL 163 08/01/2020   Lab Results  Component Value Date   HDL 67 08/01/2020   Lab Results  Component Value Date   LDLCALC 79 08/01/2020   Lab Results  Component Value Date   TRIG 83 08/01/2020   Lab Results  Component Value Date   CHOLHDL 2.4 08/01/2020   Lab Results  Component Value Date   HGBA1C 5.8 12/30/2014       Assessment & Plan:   Problem List Items Addressed This Visit       Unprioritized   Current mild episode of major depressive disorder ([place])   Chronic bronchitis ([place]) - Primary    con't inhalers Pt c/o worsening sob since having covid ------ refer to pulmonary        Relevant Orders   Ambulatory referral to Pulmonology   DG Chest 2 View   Essential hypertension    Poorly controlled will alter medications, encouraged DASH diet, minimize caffeine and obtain adequate sleep. Report concerning symptoms and follow up as directed and as needed       Relevant Medications   hydrochlorothiazide (HYDRODIURIL) 25 MG tablet   Insect bite of right lower leg    con't abx ointment and can use steroid cream  as well for itching       Relevant Medications   triamcinolone cream (KENALOG) 0.1 %   Obesity (BMI 30-39.9)    D/w pt diet and exercise        Other Visit Diagnoses     Primary hypertension       Relevant Medications   hydrochlorothiazide (HYDRODIURIL) 25 MG tablet   History of COVID-19       Relevant Orders   Ambulatory referral to Pulmonology   DG Chest 2 View        Meds ordered this encounter  Medications   hydrochlorothiazide (HYDRODIURIL) 25 MG tablet    Sig: Take  1 tablet (25 mg total) by mouth daily.    Dispense:  90 tablet    Refill:  3   triamcinolone cream (KENALOG) 0.1 %    Sig: Apply 1 application topically 2 (two) times daily.    Dispense:  30 g    Refill:  0    I, Dr. Roma Schanz, DO, personally preformed the services described in this documentation.  All medical record entries made by the scribe were at my direction and in my presence.  I have reviewed the chart and discharge instructions (if applicable) and agree that the record reflects my personal performance and is accurate and complete. 12/18/2020   I,Veronica Solomon,acting as a scribe for [name], DO.,have documented all relevant documentation on the behalf of [name], DO,as directed by  [name], DO while in the presence of [name], DO.   [name], DO

## 2020-12-18 NOTE — Assessment & Plan Note (Signed)
con't abx ointment and can use steroid cream as well for itching

## 2020-12-18 NOTE — Assessment & Plan Note (Signed)
Poorly controlled will alter medications, encouraged DASH diet, minimize caffeine and obtain adequate sleep. Report concerning symptoms and follow up as directed and as needed 

## 2020-12-18 NOTE — Patient Instructions (Signed)
https://www.nhlbi.nih.gov/files/docs/public/heart/dash_brief.pdf">  DASH Eating Plan DASH stands for Dietary Approaches to Stop Hypertension. The DASH eating plan is a healthy eating plan that has been shown to: Reduce high blood pressure (hypertension). Reduce your risk for type 2 diabetes, heart disease, and stroke. Help with weight loss. What are tips for following this plan? Reading food labels Check food labels for the amount of salt (sodium) per serving. Choose foods with less than 5 percent of the Daily Value of sodium. Generally, foods with less than 300 milligrams (mg) of sodium per serving fit into this eating plan. To find whole grains, look for the word "whole" as the first word in the ingredient list. Shopping Buy products labeled as "low-sodium" or "no salt added." Buy fresh foods. Avoid canned foods and pre-made or frozen meals. Cooking Avoid adding salt when cooking. Use salt-free seasonings or herbs instead of table salt or sea salt. Check with your health care provider or pharmacist before using salt substitutes. Do not fry foods. Cook foods using healthy methods such as baking, boiling, grilling, roasting, and broiling instead. Cook with heart-healthy oils, such as olive, canola, avocado, soybean, or sunflower oil. Meal planning  Eat a balanced diet that includes: 4 or more servings of fruits and 4 or more servings of vegetables each day. Try to fill one-half of your plate with fruits and vegetables. 6-8 servings of whole grains each day. Less than 6 oz (170 g) of lean meat, poultry, or fish each day. A 3-oz (85-g) serving of meat is about the same size as a deck of cards. One egg equals 1 oz (28 g). 2-3 servings of low-fat dairy each day. One serving is 1 cup (237 mL). 1 serving of nuts, seeds, or beans 5 times each week. 2-3 servings of heart-healthy fats. Healthy fats called omega-3 fatty acids are found in foods such as walnuts, flaxseeds, fortified milks, and eggs.  These fats are also found in cold-water fish, such as sardines, salmon, and mackerel. Limit how much you eat of: Canned or prepackaged foods. Food that is high in trans fat, such as some fried foods. Food that is high in saturated fat, such as fatty meat. Desserts and other sweets, sugary drinks, and other foods with added sugar. Full-fat dairy products. Do not salt foods before eating. Do not eat more than 4 egg yolks a week. Try to eat at least 2 vegetarian meals a week. Eat more home-cooked food and less restaurant, buffet, and fast food.  Lifestyle When eating at a restaurant, ask that your food be prepared with less salt or no salt, if possible. If you drink alcohol: Limit how much you use to: 0-1 drink a day for women who are not pregnant. 0-2 drinks a day for men. Be aware of how much alcohol is in your drink. In the [place] one drink equals one 12 oz bottle of beer (355 mL), one 5 oz glass of wine (148 mL), or one 1 oz glass of hard liquor (44 mL). General information Avoid eating more than 2,300 mg of salt a day. If you have hypertension, you may need to reduce your sodium intake to 1,500 mg a day. Work with your health care provider to maintain a healthy body weight or to lose weight. Ask what an ideal weight is for you. Get at least 30 minutes of exercise that causes your heart to beat faster (aerobic exercise) most days of the week. Activities may include walking, swimming, or biking. Work with your health care provider   or dietitian to adjust your eating plan to your individual calorie needs. What foods should I eat? Fruits All fresh, dried, or frozen fruit. Canned fruit in natural juice (without addedsugar). Vegetables Fresh or frozen vegetables (raw, steamed, roasted, or grilled). Low-sodium or reduced-sodium tomato and vegetable juice. Low-sodium or reduced-sodium tomatosauce and tomato paste. Low-sodium or reduced-sodium canned vegetables. Grains Whole-grain or  whole-wheat bread. Whole-grain or whole-wheat pasta. Brown rice. Oatmeal. Quinoa. Bulgur. Whole-grain and low-sodium cereals. Pita bread.Low-fat, low-sodium crackers. Whole-wheat flour tortillas. Meats and other proteins Skinless chicken or turkey. Ground chicken or turkey. Pork with fat trimmed off. Fish and seafood. Egg whites. Dried beans, peas, or lentils. Unsalted nuts, nut butters, and seeds. Unsalted canned beans. Lean cuts of beef with fat trimmed off. Low-sodium, lean precooked or cured meat, such as sausages or meatloaves. Dairy Low-fat (1%) or fat-free (skim) milk. Reduced-fat, low-fat, or fat-free cheeses. Nonfat, low-sodium ricotta or cottage cheese. Low-fat or nonfatyogurt. Low-fat, low-sodium cheese. Fats and oils Soft margarine without trans fats. Vegetable oil. Reduced-fat, low-fat, or light mayonnaise and salad dressings (reduced-sodium). Canola, safflower, olive, avocado, soybean, andsunflower oils. Avocado. Seasonings and condiments Herbs. Spices. Seasoning mixes without salt. Other foods Unsalted popcorn and pretzels. Fat-free sweets. The items listed above may not be a complete list of foods and beverages you can eat. Contact a dietitian for more information. What foods should I avoid? Fruits Canned fruit in a light or heavy syrup. Fried fruit. Fruit in cream or buttersauce. Vegetables Creamed or fried vegetables. Vegetables in a cheese sauce. Regular canned vegetables (not low-sodium or reduced-sodium). Regular canned tomato sauce and paste (not low-sodium or reduced-sodium). Regular tomato and vegetable juice(not low-sodium or reduced-sodium). Pickles. Olives. Grains Baked goods made with fat, such as croissants, muffins, or some breads. Drypasta or rice meal packs. Meats and other proteins Fatty cuts of meat. Ribs. Fried meat. Bacon. Bologna, salami, and other precooked or cured meats, such as sausages or meat loaves. Fat from the back of a pig (fatback). Bratwurst.  Salted nuts and seeds. Canned beans with added salt. Canned orsmoked fish. Whole eggs or egg yolks. Chicken or turkey with skin. Dairy Whole or 2% milk, cream, and half-and-half. Whole or full-fat cream cheese. Whole-fat or sweetened yogurt. Full-fat cheese. Nondairy creamers. Whippedtoppings. Processed cheese and cheese spreads. Fats and oils Butter. Stick margarine. Lard. Shortening. Ghee. Bacon fat. Tropical oils, suchas coconut, palm kernel, or palm oil. Seasonings and condiments Onion salt, garlic salt, seasoned salt, table salt, and sea salt. Worcestershire sauce. Tartar sauce. Barbecue sauce. Teriyaki sauce. Soy sauce, including reduced-sodium. Steak sauce. Canned and packaged gravies. Fish sauce. Oyster sauce. Cocktail sauce. Store-bought horseradish. Ketchup. Mustard. Meat flavorings and tenderizers. Bouillon cubes. Hot sauces. Pre-made or packaged marinades. Pre-made or packaged taco seasonings. Relishes. Regular saladdressings. Other foods Salted popcorn and pretzels. The items listed above may not be a complete list of foods and beverages you should avoid. Contact a dietitian for more information. Where to find more information National Heart, Lung, and Blood Institute: www.nhlbi.nih.gov American Heart Association: www.heart.org Academy of Nutrition and Dietetics: www.eatright.org National Kidney Foundation: www.kidney.org Summary The DASH eating plan is a healthy eating plan that has been shown to reduce high blood pressure (hypertension). It may also reduce your risk for type 2 diabetes, heart disease, and stroke. When on the DASH eating plan, aim to eat more fresh fruits and vegetables, whole grains, lean proteins, low-fat dairy, and heart-healthy fats. With the DASH eating plan, you should limit salt (sodium) intake to 2,300   mg a day. If you have hypertension, you may need to reduce your sodium intake to 1,500 mg a day. Work with your health care provider or dietitian to adjust  your eating plan to your individual calorie needs. This information is not intended to replace advice given to you by your health care provider. Make sure you discuss any questions you have with your healthcare provider. Document Revised: 05/04/2019 Document Reviewed: 05/04/2019 Elsevier Patient Education  2022 Elsevier Inc.  

## 2020-12-18 NOTE — Assessment & Plan Note (Signed)
D/w pt diet and exercise  

## 2021-01-05 DIAGNOSIS — M5431 Sciatica, right side: Secondary | ICD-10-CM | POA: Diagnosis not present

## 2021-01-05 DIAGNOSIS — M9903 Segmental and somatic dysfunction of lumbar region: Secondary | ICD-10-CM | POA: Diagnosis not present

## 2021-01-05 DIAGNOSIS — M9902 Segmental and somatic dysfunction of thoracic region: Secondary | ICD-10-CM | POA: Diagnosis not present

## 2021-01-05 DIAGNOSIS — M9905 Segmental and somatic dysfunction of pelvic region: Secondary | ICD-10-CM | POA: Diagnosis not present

## 2021-01-05 DIAGNOSIS — M5134 Other intervertebral disc degeneration, thoracic region: Secondary | ICD-10-CM | POA: Diagnosis not present

## 2021-01-05 DIAGNOSIS — M5136 Other intervertebral disc degeneration, lumbar region: Secondary | ICD-10-CM | POA: Diagnosis not present

## 2021-01-08 ENCOUNTER — Ambulatory Visit: Payer: Medicare PPO | Admitting: Family Medicine

## 2021-01-08 ENCOUNTER — Other Ambulatory Visit: Payer: Self-pay

## 2021-01-08 ENCOUNTER — Encounter: Payer: Self-pay | Admitting: Family Medicine

## 2021-01-08 VITALS — BP 130/70 | HR 89 | Temp 97.9°F | Resp 18 | Ht 68.5 in | Wt 259.2 lb

## 2021-01-08 DIAGNOSIS — I10 Essential (primary) hypertension: Secondary | ICD-10-CM | POA: Diagnosis not present

## 2021-01-08 DIAGNOSIS — E785 Hyperlipidemia, unspecified: Secondary | ICD-10-CM | POA: Diagnosis not present

## 2021-01-08 NOTE — Patient Instructions (Signed)
https://www.nhlbi.nih.gov/files/docs/public/heart/dash_brief.pdf">  DASH Eating Plan DASH stands for Dietary Approaches to Stop Hypertension. The DASH eating plan is a healthy eating plan that has been shown to: Reduce high blood pressure (hypertension). Reduce your risk for type 2 diabetes, heart disease, and stroke. Help with weight loss. What are tips for following this plan? Reading food labels Check food labels for the amount of salt (sodium) per serving. Choose foods with less than 5 percent of the Daily Value of sodium. Generally, foods with less than 300 milligrams (mg) of sodium per serving fit into this eating plan. To find whole grains, look for the word "whole" as the first word in the ingredient list. Shopping Buy products labeled as "low-sodium" or "no salt added." Buy fresh foods. Avoid canned foods and pre-made or frozen meals. Cooking Avoid adding salt when cooking. Use salt-free seasonings or herbs instead of table salt or sea salt. Check with your health care provider or pharmacist before using salt substitutes. Do not fry foods. Cook foods using healthy methods such as baking, boiling, grilling, roasting, and broiling instead. Cook with heart-healthy oils, such as olive, canola, avocado, soybean, or sunflower oil. Meal planning  Eat a balanced diet that includes: 4 or more servings of fruits and 4 or more servings of vegetables each day. Try to fill one-half of your plate with fruits and vegetables. 6-8 servings of whole grains each day. Less than 6 oz (170 g) of lean meat, poultry, or fish each day. A 3-oz (85-g) serving of meat is about the same size as a deck of cards. One egg equals 1 oz (28 g). 2-3 servings of low-fat dairy each day. One serving is 1 cup (237 mL). 1 serving of nuts, seeds, or beans 5 times each week. 2-3 servings of heart-healthy fats. Healthy fats called omega-3 fatty acids are found in foods such as walnuts, flaxseeds, fortified milks, and eggs.  These fats are also found in cold-water fish, such as sardines, salmon, and mackerel. Limit how much you eat of: Canned or prepackaged foods. Food that is high in trans fat, such as some fried foods. Food that is high in saturated fat, such as fatty meat. Desserts and other sweets, sugary drinks, and other foods with added sugar. Full-fat dairy products. Do not salt foods before eating. Do not eat more than 4 egg yolks a week. Try to eat at least 2 vegetarian meals a week. Eat more home-cooked food and less restaurant, buffet, and fast food.  Lifestyle When eating at a restaurant, ask that your food be prepared with less salt or no salt, if possible. If you drink alcohol: Limit how much you use to: 0-1 drink a day for women who are not pregnant. 0-2 drinks a day for men. Be aware of how much alcohol is in your drink. In the [place] one drink equals one 12 oz bottle of beer (355 mL), one 5 oz glass of wine (148 mL), or one 1 oz glass of hard liquor (44 mL). General information Avoid eating more than 2,300 mg of salt a day. If you have hypertension, you may need to reduce your sodium intake to 1,500 mg a day. Work with your health care provider to maintain a healthy body weight or to lose weight. Ask what an ideal weight is for you. Get at least 30 minutes of exercise that causes your heart to beat faster (aerobic exercise) most days of the week. Activities may include walking, swimming, or biking. Work with your health care provider   or dietitian to adjust your eating plan to your individual calorie needs. What foods should I eat? Fruits All fresh, dried, or frozen fruit. Canned fruit in natural juice (without addedsugar). Vegetables Fresh or frozen vegetables (raw, steamed, roasted, or grilled). Low-sodium or reduced-sodium tomato and vegetable juice. Low-sodium or reduced-sodium tomatosauce and tomato paste. Low-sodium or reduced-sodium canned vegetables. Grains Whole-grain or  whole-wheat bread. Whole-grain or whole-wheat pasta. Brown rice. Oatmeal. Quinoa. Bulgur. Whole-grain and low-sodium cereals. Pita bread.Low-fat, low-sodium crackers. Whole-wheat flour tortillas. Meats and other proteins Skinless chicken or turkey. Ground chicken or turkey. Pork with fat trimmed off. Fish and seafood. Egg whites. Dried beans, peas, or lentils. Unsalted nuts, nut butters, and seeds. Unsalted canned beans. Lean cuts of beef with fat trimmed off. Low-sodium, lean precooked or cured meat, such as sausages or meatloaves. Dairy Low-fat (1%) or fat-free (skim) milk. Reduced-fat, low-fat, or fat-free cheeses. Nonfat, low-sodium ricotta or cottage cheese. Low-fat or nonfatyogurt. Low-fat, low-sodium cheese. Fats and oils Soft margarine without trans fats. Vegetable oil. Reduced-fat, low-fat, or light mayonnaise and salad dressings (reduced-sodium). Canola, safflower, olive, avocado, soybean, andsunflower oils. Avocado. Seasonings and condiments Herbs. Spices. Seasoning mixes without salt. Other foods Unsalted popcorn and pretzels. Fat-free sweets. The items listed above may not be a complete list of foods and beverages you can eat. Contact a dietitian for more information. What foods should I avoid? Fruits Canned fruit in a light or heavy syrup. Fried fruit. Fruit in cream or buttersauce. Vegetables Creamed or fried vegetables. Vegetables in a cheese sauce. Regular canned vegetables (not low-sodium or reduced-sodium). Regular canned tomato sauce and paste (not low-sodium or reduced-sodium). Regular tomato and vegetable juice(not low-sodium or reduced-sodium). Pickles. Olives. Grains Baked goods made with fat, such as croissants, muffins, or some breads. Drypasta or rice meal packs. Meats and other proteins Fatty cuts of meat. Ribs. Fried meat. Bacon. Bologna, salami, and other precooked or cured meats, such as sausages or meat loaves. Fat from the back of a pig (fatback). Bratwurst.  Salted nuts and seeds. Canned beans with added salt. Canned orsmoked fish. Whole eggs or egg yolks. Chicken or turkey with skin. Dairy Whole or 2% milk, cream, and half-and-half. Whole or full-fat cream cheese. Whole-fat or sweetened yogurt. Full-fat cheese. Nondairy creamers. Whippedtoppings. Processed cheese and cheese spreads. Fats and oils Butter. Stick margarine. Lard. Shortening. Ghee. Bacon fat. Tropical oils, suchas coconut, palm kernel, or palm oil. Seasonings and condiments Onion salt, garlic salt, seasoned salt, table salt, and sea salt. Worcestershire sauce. Tartar sauce. Barbecue sauce. Teriyaki sauce. Soy sauce, including reduced-sodium. Steak sauce. Canned and packaged gravies. Fish sauce. Oyster sauce. Cocktail sauce. Store-bought horseradish. Ketchup. Mustard. Meat flavorings and tenderizers. Bouillon cubes. Hot sauces. Pre-made or packaged marinades. Pre-made or packaged taco seasonings. Relishes. Regular saladdressings. Other foods Salted popcorn and pretzels. The items listed above may not be a complete list of foods and beverages you should avoid. Contact a dietitian for more information. Where to find more information National Heart, Lung, and Blood Institute: www.nhlbi.nih.gov American Heart Association: www.heart.org Academy of Nutrition and Dietetics: www.eatright.org National Kidney Foundation: www.kidney.org Summary The DASH eating plan is a healthy eating plan that has been shown to reduce high blood pressure (hypertension). It may also reduce your risk for type 2 diabetes, heart disease, and stroke. When on the DASH eating plan, aim to eat more fresh fruits and vegetables, whole grains, lean proteins, low-fat dairy, and heart-healthy fats. With the DASH eating plan, you should limit salt (sodium) intake to 2,300   mg a day. If you have hypertension, you may need to reduce your sodium intake to 1,500 mg a day. Work with your health care provider or dietitian to adjust  your eating plan to your individual calorie needs. This information is not intended to replace advice given to you by your health care provider. Make sure you discuss any questions you have with your healthcare provider. Document Revised: 05/04/2019 Document Reviewed: 05/04/2019 Elsevier Patient Education  2022 Elsevier Inc.  

## 2021-01-08 NOTE — Assessment & Plan Note (Signed)
Encourage heart healthy diet such as MIND or DASH diet, increase exercise, avoid trans fats, simple carbohydrates and processed foods, consider a krill or fish or flaxseed oil cap daily.  °

## 2021-01-08 NOTE — Assessment & Plan Note (Signed)
Well controlled, no changes to meds. Encouraged heart healthy diet such as the DASH diet and exercise as tolerated.  °

## 2021-01-08 NOTE — Progress Notes (Signed)
Subjective:   By signing my name below, I, [name], attest that this documentation has been prepared under the direction and in the presence of Dr. Roma Schanz, DO. 01/08/2021    Patient ID: Veronica Solomon, female    DOB: 06/18/1955, 65 y.o.   MRN: [id number]  Chief Complaint  Patient presents with   Hypertension    Pt states bps at home were 165/85, 173/84   Follow-up    HPI Patient is in today for a office visit. Her blood pressure measurements at home are higher than her readings in office. She continues taking 25 mg hydrochlorothiazide daily PO and 10 mg amlodipine daily PO and reports no new issues. She also notes she eats a banana a day to help control her potassium levels.  BP Readings from Last 3 Encounters:  01/08/21 130/70  12/18/20 (!) 148/80  12/04/20 (!) 142/80    Past Medical History:  Diagnosis Date   Adenomatous colon polyp    Allergy    C. difficile colitis    Diverticulosis    Hyperlipidemia    Hypertension    IBS (irritable bowel syndrome)    Neuromuscular disorder (HCC)     Past Surgical History:  Procedure Laterality Date   ABDOMINAL HYSTERECTOMY  2002   fibroids    Family History  Problem Relation Age of Onset   Hypertension Mother    Depression Mother    Heart attack Mother    Uterine cancer Mother    Hypertension Paternal Grandmother    Diabetes Maternal Grandmother    Breast cancer Sister     Social History   Socioeconomic History   Marital status: Single    Spouse name: Not on file   Number of children: Not on file   Years of education: Not on file   Highest education level: Not on file  Occupational History   Not on file  Tobacco Use   Smoking status: Former    Packs/day: 1.00    Years: 42.00    Pack years: 42.00    Types: Cigarettes    Quit date: 12/29/2011    Years since quitting: 9.0   Smokeless tobacco: Former   Tobacco comments:    Quit 12/2011  Vaping Use   Vaping Use: Never used  Substance  and Sexual Activity   Alcohol use: Yes    Comment: rarely   Drug use: No   Sexual activity: Not Currently  Other Topics Concern   Not on file  Social History Narrative   Not on file   Social Determinants of Health   Financial Resource Strain: Not on file  Food Insecurity: Not on file  Transportation Needs: Not on file  Physical Activity: Not on file  Stress: Not on file  Social Connections: Not on file  Intimate Partner Violence: Not on file    Outpatient Medications Prior to Visit  Medication Sig Dispense Refill   albuterol (VENTOLIN HFA) 108 (90 Base) MCG/ACT inhaler Inhale 2 puffs into the lungs every 6 (six) hours as needed for wheezing or shortness of breath. 18 g 5   amLODipine (NORVASC) 10 MG tablet Take 1 tablet (10 mg total) by mouth daily. 90 tablet 1   atorvastatin (LIPITOR) 20 MG tablet TAKE 1 TABLET(20 MG) BY MOUTH DAILY 90 tablet 1   celecoxib (CELEBREX) 200 MG capsule Take 1 capsule (200 mg total) by mouth 2 (two) times daily as needed for moderate pain. 180 capsule 0   DULoxetine (CYMBALTA) 60 MG  capsule TAKE 2 CAPSULES BY MOUTH EVERY DAY 180 capsule 1   Fluticasone-Umeclidin-Vilant (TRELEGY ELLIPTA) 100-62.5-25 MCG/INH AEPB Inhale 1 puff into the lungs daily. 60 each 2   hydrochlorothiazide (HYDRODIURIL) 25 MG tablet Take 1 tablet (25 mg total) by mouth daily. 90 tablet 3   HYDROcodone-acetaminophen (NORCO) 10-325 MG tablet Take 1 tablet by mouth every 8 (eight) hours as needed. 30 tablet 0   pregabalin (LYRICA) 75 MG capsule TAKE 1 CAPSULE(75 MG) BY MOUTH TWICE DAILY 180 capsule 1   traMADol (ULTRAM) 50 MG tablet TAKE 1 TABLET(50 MG) BY MOUTH EVERY 6 HOURS AS NEEDED 90 tablet 0   triamcinolone cream (KENALOG) 0.1 % Apply 1 application topically 2 (two) times daily. 30 g 0   No facility-administered medications prior to visit.    Allergies  Allergen Reactions   Paregoric     REACTION: HIVES   Penicillins     REACTION: RASH   Procaine Hcl     Review of  Systems  Constitutional:  Negative for fever and malaise/fatigue.  HENT:  Negative for congestion.   Eyes:  Negative for blurred vision.  Respiratory:  Negative for cough and shortness of breath.   Cardiovascular:  Negative for chest pain, palpitations and leg swelling.  Gastrointestinal:  Negative for vomiting.  Musculoskeletal:  Negative for back pain.  Skin:  Negative for rash.  Neurological:  Negative for loss of consciousness and headaches.      Objective:    Physical Exam Constitutional:      General: She is not in acute distress.    Appearance: Normal appearance. She is not ill-appearing.  HENT:     Head: Normocephalic and atraumatic.     Right Ear: External ear normal.     Left Ear: External ear normal.  Eyes:     Extraocular Movements: Extraocular movements intact.     Pupils: Pupils are equal, round, and reactive to light.  Cardiovascular:     Rate and Rhythm: Normal rate and regular rhythm.     Heart sounds: Normal heart sounds. No murmur heard.   No gallop.  Pulmonary:     Effort: Pulmonary effort is normal. No respiratory distress.     Breath sounds: Normal breath sounds. No wheezing or rales.  Skin:    General: Skin is warm and dry.  Neurological:     Mental Status: She is alert and oriented to person, place, and time.  Psychiatric:        Behavior: Behavior normal.        Judgment: Judgment normal.    BP 130/70 (BP Location: Right Arm, Patient Position: Sitting, Cuff Size: Large)   Pulse 89   Temp 97.9 F (36.6 C) (Oral)   Resp 18   Ht 5' 8.5" (1.74 m)   Wt 259 lb 3.2 oz (117.6 kg)   SpO2 94%   BMI 38.84 kg/m  Wt Readings from Last 3 Encounters:  01/08/21 259 lb 3.2 oz (117.6 kg)  12/18/20 261 lb (118.4 kg)  12/04/20 260 lb (117.9 kg)    Diabetic Foot Exam - Simple   No data filed    Lab Results  Component Value Date   WBC 8.9 07/03/2015   HGB 13.8 07/03/2015   HCT 42.2 07/03/2015   PLT 262.0 07/03/2015   GLUCOSE 101 (H) 08/20/2020    CHOL 163 08/01/2020   TRIG 83 08/01/2020   HDL 67 08/01/2020   LDLDIRECT 195.7 01/12/2010   LDLCALC 79 08/01/2020   ALT 27 08/01/2020  AST 23 08/01/2020   NA 140 08/20/2020   K 5.1 08/20/2020   CL 103 08/20/2020   CREATININE 0.75 08/20/2020   BUN 15 08/20/2020   CO2 33 (H) 08/20/2020   TSH 1.13 01/26/2018   HGBA1C 5.8 12/30/2014   MICROALBUR 4.1 (H) 11/24/2012    Lab Results  Component Value Date   TSH 1.13 01/26/2018   Lab Results  Component Value Date   WBC 8.9 07/03/2015   HGB 13.8 07/03/2015   HCT 42.2 07/03/2015   MCV 86.4 07/03/2015   PLT 262.0 07/03/2015   Lab Results  Component Value Date   NA 140 08/20/2020   K 5.1 08/20/2020   CO2 33 (H) 08/20/2020   GLUCOSE 101 (H) 08/20/2020   BUN 15 08/20/2020   CREATININE 0.75 08/20/2020   BILITOT 0.6 08/01/2020   ALKPHOS 123 (H) 04/26/2019   AST 23 08/01/2020   ALT 27 08/01/2020   PROT 6.8 08/01/2020   ALBUMIN 4.7 04/26/2019   CALCIUM 10.1 08/20/2020   GFR 84.41 08/20/2020   Lab Results  Component Value Date   CHOL 163 08/01/2020   Lab Results  Component Value Date   HDL 67 08/01/2020   Lab Results  Component Value Date   LDLCALC 79 08/01/2020   Lab Results  Component Value Date   TRIG 83 08/01/2020   Lab Results  Component Value Date   CHOLHDL 2.4 08/01/2020   Lab Results  Component Value Date   HGBA1C 5.8 12/30/2014       Assessment & Plan:   Problem List Items Addressed This Visit       Unprioritized   Essential hypertension    Well controlled, no changes to meds. Encouraged heart healthy diet such as the DASH diet and exercise as tolerated.        Hyperlipidemia    Encourage heart healthy diet such as MIND or DASH diet, increase exercise, avoid trans fats, simple carbohydrates and processed foods, consider a krill or fish or flaxseed oil cap daily.        Other Visit Diagnoses     Primary hypertension    -  Primary   Relevant Orders   Basic Metabolic Panel (BMET)         No orders of the defined types were placed in this encounter.   I, Dr. Roma Schanz, DO, personally preformed the services described in this documentation.  All medical record entries made by the scribe were at my direction and in my presence.  I have reviewed the chart and discharge instructions (if applicable) and agree that the record reflects my personal performance and is accurate and complete. 01/08/2021   I,[name],acting as a scribe for [employer], DO.,have documented all relevant documentation on the behalf of [name], DO,as directed by  [name], DO while in the presence of [name], DO.   [name], DO

## 2021-01-09 LAB — BASIC METABOLIC PANEL
BUN: 22 mg/dL (ref 6–23)
CO2: 33 mEq/L — ABNORMAL HIGH (ref 19–32)
Calcium: 10.6 mg/dL — ABNORMAL HIGH (ref 8.4–10.5)
Chloride: 100 mEq/L (ref 96–112)
Creatinine, Ser: 0.99 mg/dL (ref 0.40–1.20)
GFR: 60.33 mL/min (ref >60.00)
Glucose, Bld: 94 mg/dL (ref 70–99)
Potassium: 4.4 mEq/L (ref 3.5–5.1)
Sodium: 141 mEq/L (ref 135–145)

## 2021-01-12 DIAGNOSIS — M9902 Segmental and somatic dysfunction of thoracic region: Secondary | ICD-10-CM | POA: Diagnosis not present

## 2021-01-12 DIAGNOSIS — M5431 Sciatica, right side: Secondary | ICD-10-CM | POA: Diagnosis not present

## 2021-01-12 DIAGNOSIS — M5136 Other intervertebral disc degeneration, lumbar region: Secondary | ICD-10-CM | POA: Diagnosis not present

## 2021-01-12 DIAGNOSIS — M9905 Segmental and somatic dysfunction of pelvic region: Secondary | ICD-10-CM | POA: Diagnosis not present

## 2021-01-12 DIAGNOSIS — M9903 Segmental and somatic dysfunction of lumbar region: Secondary | ICD-10-CM | POA: Diagnosis not present

## 2021-01-12 DIAGNOSIS — M5134 Other intervertebral disc degeneration, thoracic region: Secondary | ICD-10-CM | POA: Diagnosis not present

## 2021-01-19 DIAGNOSIS — M9902 Segmental and somatic dysfunction of thoracic region: Secondary | ICD-10-CM | POA: Diagnosis not present

## 2021-01-19 DIAGNOSIS — M5134 Other intervertebral disc degeneration, thoracic region: Secondary | ICD-10-CM | POA: Diagnosis not present

## 2021-01-19 DIAGNOSIS — M5431 Sciatica, right side: Secondary | ICD-10-CM | POA: Diagnosis not present

## 2021-01-19 DIAGNOSIS — M9903 Segmental and somatic dysfunction of lumbar region: Secondary | ICD-10-CM | POA: Diagnosis not present

## 2021-01-19 DIAGNOSIS — M9905 Segmental and somatic dysfunction of pelvic region: Secondary | ICD-10-CM | POA: Diagnosis not present

## 2021-01-19 DIAGNOSIS — M5136 Other intervertebral disc degeneration, lumbar region: Secondary | ICD-10-CM | POA: Diagnosis not present

## 2021-01-23 ENCOUNTER — Encounter: Payer: Self-pay | Admitting: Family Medicine

## 2021-01-23 ENCOUNTER — Other Ambulatory Visit: Payer: Self-pay

## 2021-01-23 ENCOUNTER — Telehealth (INDEPENDENT_AMBULATORY_CARE_PROVIDER_SITE_OTHER): Payer: Medicare PPO | Admitting: Family Medicine

## 2021-01-23 DIAGNOSIS — J441 Chronic obstructive pulmonary disease with (acute) exacerbation: Secondary | ICD-10-CM | POA: Diagnosis not present

## 2021-01-23 DIAGNOSIS — J439 Emphysema, unspecified: Secondary | ICD-10-CM | POA: Diagnosis not present

## 2021-01-23 DIAGNOSIS — U071 COVID-19: Secondary | ICD-10-CM | POA: Diagnosis not present

## 2021-01-23 MED ORDER — PREDNISONE 20 MG PO TABS: 40.0000 mg | ORAL_TABLET | Freq: Every day | ORAL | 0 refills | Status: AC

## 2021-01-23 MED ORDER — MOLNUPIRAVIR EUA 200MG CAPSULE: 4.0000 | ORAL_CAPSULE | Freq: Two times a day (BID) | ORAL | 0 refills | Status: AC

## 2021-01-23 NOTE — Progress Notes (Signed)
Chief Complaint  Patient presents with   Covid Positive   Sore Throat   Generalized Body Aches    Symptoms started 01/21/21 Tested positive 01/22/21   Cough    [facility] here for URI complaints. Due to COVID-19 pandemic, we are interacting via web portal for an electronic face-to-face visit. I verified patient's ID using 2 identifiers. Patient agreed to proceed with visit via this method. Patient is at home, I am at office. Patient and I are present for visit.   Duration: 2 days  Associated symptoms: Fever (100 F), sinus headache, sinus congestion, sinus pain, rhinorrhea, itchy watery eyes, sore throat, wheezing, shortness of breath, chest tightness, myalgia, and coughing Denies: ear fullness, ear pain, ear drainage, chest pain, and N/V/D, loss of taste/smell Treatment to date: none Sick contacts: Yes; daughter and grandchildren Tested + for covid 8/11.  +hx of COPD.   Past Medical History:  Diagnosis Date   Adenomatous colon polyp    Allergy    C. difficile colitis    Diverticulosis    Hyperlipidemia    Hypertension    IBS (irritable bowel syndrome)    Neuromuscular disorder ([place])     Objective No conversational dyspnea Age appropriate judgment and insight Nml affect and mood  COVID-19 - Plan: molnupiravir EUA 200 mg CAPS  Pulmonary emphysema, unspecified emphysema type ([place])  COPD exacerbation (HCC) - Plan: predniSONE (DELTASONE) 20 MG tablet Given comorbidities, will tx w above antiviral. Continue to push fluids, practice good hand hygiene, cover mouth when coughing. Cont meds Chronic, exacerbation. 5 d pred burst, 40 mg/d.  F/u prn. If starting to experience irrep fluid loss, shaking, or shortness of breath, seek immediate care. Pt voiced understanding and agreement to the plan.  [place], DO 01/23/21 10:11 AM

## 2021-01-28 ENCOUNTER — Other Ambulatory Visit: Payer: Self-pay | Admitting: Family Medicine

## 2021-01-28 DIAGNOSIS — J439 Emphysema, unspecified: Secondary | ICD-10-CM

## 2021-01-30 ENCOUNTER — Encounter: Payer: Self-pay | Admitting: Family Medicine

## 2021-01-30 ENCOUNTER — Other Ambulatory Visit: Payer: Self-pay | Admitting: Family Medicine

## 2021-01-30 DIAGNOSIS — J439 Emphysema, unspecified: Secondary | ICD-10-CM

## 2021-01-30 DIAGNOSIS — G894 Chronic pain syndrome: Secondary | ICD-10-CM

## 2021-01-31 ENCOUNTER — Other Ambulatory Visit: Payer: Self-pay | Admitting: Family Medicine

## 2021-01-31 DIAGNOSIS — J439 Emphysema, unspecified: Secondary | ICD-10-CM

## 2021-02-01 ENCOUNTER — Other Ambulatory Visit: Payer: Self-pay | Admitting: Family Medicine

## 2021-02-01 DIAGNOSIS — G894 Chronic pain syndrome: Secondary | ICD-10-CM

## 2021-02-01 MED ORDER — HYDROCODONE-ACETAMINOPHEN 10-325 MG PO TABS: 1.0000 | ORAL_TABLET | Freq: Three times a day (TID) | ORAL | 0 refills | Status: DC | PRN

## 2021-02-02 MED ORDER — TRELEGY ELLIPTA 100-62.5-25 MCG/INH IN AEPB: 1.0000 | INHALATION_SPRAY | Freq: Every day | RESPIRATORY_TRACT | 2 refills | Status: DC

## 2021-02-02 MED ORDER — TRAMADOL HCL 50 MG PO TABS: ORAL_TABLET | ORAL | 0 refills | Status: DC

## 2021-02-02 NOTE — Telephone Encounter (Signed)
Requesting: tramadol '50mg'$  Contract: 04/26/2019 UDS: 04/26/2019 Last Visit: 01/08/2021 Next Visit: 04/10/2021 Last Refill: 11/12/2020 #90 and 0RF Pt sig: 1 tab q6h prn  Please Advise

## 2021-02-09 DIAGNOSIS — M5136 Other intervertebral disc degeneration, lumbar region: Secondary | ICD-10-CM | POA: Diagnosis not present

## 2021-02-09 DIAGNOSIS — M9902 Segmental and somatic dysfunction of thoracic region: Secondary | ICD-10-CM | POA: Diagnosis not present

## 2021-02-09 DIAGNOSIS — M5134 Other intervertebral disc degeneration, thoracic region: Secondary | ICD-10-CM | POA: Diagnosis not present

## 2021-02-09 DIAGNOSIS — M9905 Segmental and somatic dysfunction of pelvic region: Secondary | ICD-10-CM | POA: Diagnosis not present

## 2021-02-09 DIAGNOSIS — M5431 Sciatica, right side: Secondary | ICD-10-CM | POA: Diagnosis not present

## 2021-02-09 DIAGNOSIS — M9903 Segmental and somatic dysfunction of lumbar region: Secondary | ICD-10-CM | POA: Diagnosis not present

## 2021-02-11 ENCOUNTER — Ambulatory Visit (INDEPENDENT_AMBULATORY_CARE_PROVIDER_SITE_OTHER): Payer: Medicare PPO

## 2021-02-11 VITALS — Ht 68.5 in | Wt 259.0 lb

## 2021-02-11 DIAGNOSIS — Z1231 Encounter for screening mammogram for malignant neoplasm of breast: Secondary | ICD-10-CM | POA: Diagnosis not present

## 2021-02-11 DIAGNOSIS — Z0000 Encounter for general adult medical examination without abnormal findings: Secondary | ICD-10-CM

## 2021-02-11 NOTE — Progress Notes (Addendum)
Subjective:   Veronica Solomon is a 65 y.o. female who presents for an Initial Medicare Annual Wellness Visit.  I connected with Qamar today by telephone and verified that I am speaking with the correct person using two identifiers. Location patient: home Location provider: work Persons participating in the virtual visit: patient, Marine scientist.    I discussed the limitations, risks, security and privacy concerns of performing an evaluation and management service by telephone and the availability of in person appointments. I also discussed with the patient that there may be a patient responsible charge related to this service. The patient expressed understanding and verbally consented to this telephonic visit.    Interactive audio and video telecommunications were attempted between this provider and patient, however failed, due to patient having technical difficulties OR patient did not have access to video capability.  We continued and completed visit with audio only.  Some vital signs may be absent or patient reported.   Time Spent with patient on telephone encounter: 20 minutes   Review of Systems     Cardiac Risk Factors include: dyslipidemia;hypertension;obesity (BMI >30kg/m2)     Objective:    Today's Vitals   02/11/21 1538  Weight: 259 lb (117.5 kg)  Height: 5' 8.5" (1.74 m)  PainSc: 5    Body mass index is 38.81 kg/m.  Advanced Directives 02/11/2021 03/08/2020  Does Patient Have a Medical Advance Directive? No No  Would patient like information on creating a medical advance directive? Yes (MAU/Ambulatory/Procedural Areas - Information given) -    Current Medications (verified) Outpatient Encounter Medications as of 02/11/2021  Medication Sig   albuterol (VENTOLIN HFA) 108 (90 Base) MCG/ACT inhaler Inhale 2 puffs into the lungs every 6 (six) hours as needed for wheezing or shortness of breath.   amLODipine (NORVASC) 10 MG tablet Take 1 tablet (10 mg total) by mouth  daily.   atorvastatin (LIPITOR) 20 MG tablet TAKE 1 TABLET(20 MG) BY MOUTH DAILY   celecoxib (CELEBREX) 200 MG capsule Take 1 capsule (200 mg total) by mouth 2 (two) times daily as needed for moderate pain.   DULoxetine (CYMBALTA) 60 MG capsule TAKE 2 CAPSULES BY MOUTH EVERY DAY   Fluticasone-Umeclidin-Vilant (TRELEGY ELLIPTA) 100-62.5-25 MCG/INH AEPB Inhale 1 puff into the lungs daily.   hydrochlorothiazide (HYDRODIURIL) 25 MG tablet Take 1 tablet (25 mg total) by mouth daily.   HYDROcodone-acetaminophen (NORCO) 10-325 MG tablet Take 1 tablet by mouth every 8 (eight) hours as needed.   pregabalin (LYRICA) 75 MG capsule TAKE 1 CAPSULE(75 MG) BY MOUTH TWICE DAILY   traMADol (ULTRAM) 50 MG tablet TAKE 1 TABLET(50 MG) BY MOUTH EVERY 6 HOURS AS NEEDED   triamcinolone cream (KENALOG) 0.1 % Apply 1 application topically 2 (two) times daily.   No facility-administered encounter medications on file as of 02/11/2021.    Allergies (verified) Paregoric, Penicillins, and Procaine hcl   History: Past Medical History:  Diagnosis Date   Adenomatous colon polyp    Allergy    C. difficile colitis    Diverticulosis    Hyperlipidemia    Hypertension    IBS (irritable bowel syndrome)    Neuromuscular disorder ([place])    Past Surgical History:  Procedure Laterality Date   ABDOMINAL HYSTERECTOMY  2002   fibroids   Family History  Problem Relation Age of Onset   Hypertension Mother    Depression Mother    Heart attack Mother    Uterine cancer Mother    Hypertension Paternal Grandmother  Diabetes Maternal Grandmother    Breast cancer Sister    Social History   Socioeconomic History   Marital status: Single    Spouse name: Not on file   Number of children: Not on file   Years of education: Not on file   Highest education level: Not on file  Occupational History   Not on file  Tobacco Use   Smoking status: Former    Packs/day: 1.00    Years: 42.00    Pack years: 42.00    Types:  Cigarettes    Quit date: 12/29/2011    Years since quitting: 9.1   Smokeless tobacco: Former   Tobacco comments:    Quit 12/2011  Vaping Use   Vaping Use: Never used  Substance and Sexual Activity   Alcohol use: Yes    Comment: rarely   Drug use: No   Sexual activity: Not Currently  Other Topics Concern   Not on file  Social History Narrative   Not on file   Social Determinants of Health   Financial Resource Strain: Low Risk    Difficulty of Paying Living Expenses: Not hard at all  Food Insecurity: No Food Insecurity   Worried About Charity fundraiser in the Last Year: Never true   [place] in the Last Year: Never true  Transportation Needs: No Transportation Needs   Lack of Transportation (Medical): No   Lack of Transportation (Non-Medical): No  Physical Activity: Insufficiently Active   Days of Exercise per Week: 7 days   Minutes of Exercise per Session: 20 min  Stress: Stress Concern Present   Feeling of Stress : To some extent  Social Connections: Socially Isolated   Frequency of Communication with Friends and Family: More than three times a week   Frequency of Social Gatherings with Friends and Family: More than three times a week   Attends Religious Services: Never   Marine scientist or Organizations: No   Attends Archivist Meetings: Never   Marital Status: Never married    Tobacco Counseling Counseling given: Not Answered Tobacco comments: Quit 12/2011   Clinical Intake:  Pre-visit preparation completed: Yes  Pain : 0-10 Pain Score: 5  Pain Type: Chronic pain Pain Location: Foot (back & neck also-has Fibromyalgia) Pain Onset: More than a month ago Pain Frequency: Constant     BMI - recorded: 38.81 Nutritional Status: BMI > 30  Obese Nutritional Risks: Unintentional weight gain (20 pounds in 6 months) Diabetes: No  How often do you need to have someone help you when you read instructions, pamphlets, or other written  materials from your doctor or pharmacy?: 1 - Never  Diabetic?No  Interpreter Needed?: No  Information entered by :: Caroleen Hamman LPN   Activities of Daily Living In your present state of health, do you have any difficulty performing the following activities: 02/11/2021  Hearing? N  Vision? N  Difficulty concentrating or making decisions? Y  Comment occasionally  Walking or climbing stairs? N  Dressing or bathing? N  Doing errands, shopping? N  Preparing Food and eating ? N  Using the Toilet? N  In the past six months, have you accidently leaked urine? Y  Comment ccasionally  Do you have problems with loss of bowel control? N  Managing your Medications? N  Managing your Finances? N  Housekeeping or managing your Housekeeping? N  Some recent data might be hidden    Patient Care Team: Carollee Herter, Alferd Apa,  DO as PCP - General  Indicate any recent Medical Services you may have received from other than [facility] providers in the past year (date may be approximate).     Assessment:   This is a routine wellness examination for [facility].  Hearing/Vision screen Hearing Screening - Comments:: No issues Vision Screening - Comments:: Last eye exam-2021-Dr. DeAllen  Dietary issues and exercise activities discussed: Current Exercise Habits: Home exercise routine, Type of exercise: walking, Frequency (Times/Week): 7, Intensity: Mild, Exercise limited by: None identified   Goals Addressed             This Visit's Progress    Patient Stated       Increase walking       Depression Screen PHQ 2/9 Scores 02/11/2021 08/01/2020 04/26/2019 07/25/2018 06/28/2016  PHQ - 2 Score 1 4 0 2 2  PHQ- 9 Score - 21 - 12 15    Fall Risk Fall Risk  02/11/2021 10/27/2020 09/05/2017 09/01/2016 06/28/2016  Falls in the past year? 1 0 No Yes No  Number falls in past yr: 1 1 - 2 or more -  Injury with Fall? 0 1 - No -  Risk for fall due to : History of fall(s) No Fall Risks - - -  Follow up Falls  prevention discussed Falls evaluation completed - - -    FALL RISK PREVENTION PERTAINING TO THE HOME:  Any stairs in or around the home? No  Home free of loose throw rugs in walkways, pet beds, electrical cords, etc? Yes  Adequate lighting in your home to reduce risk of falls? Yes   ASSISTIVE DEVICES UTILIZED TO PREVENT FALLS:  Life alert? No  Use of a cane, walker or w/c? No  Grab bars in the bathroom? No  Shower chair or bench in shower? No  Elevated toilet seat or a handicapped toilet? No   TIMED UP AND GO:  Was the test performed? No . Phone visit   Cognitive Function:Normal cognitive status assessed by this Nurse Health Advisor. No abnormalities found.   MMSE - Mini Mental State Exam 09/05/2017  Orientation to time 5  Orientation to Place 5  Registration 3  Attention/ Calculation 5  Recall 3  Language- name 2 objects 2  Language- repeat 1  Language- follow 3 step command 3  Language- read & follow direction 1  Write a sentence 1  Copy design 1  Total score 30   Montreal Cognitive Assessment  09/01/2016  Visuospatial/ Executive (0/5) 5  Naming (0/3) 3  Attention: Read list of digits (0/2) 2  Attention: Read list of letters (0/1) 1  Attention: Serial 7 subtraction starting at 100 (0/3) 3  Language: Repeat phrase (0/2) 2  Language : Fluency (0/1) 1  Abstraction (0/2) 2  Delayed Recall (0/5) 4  Orientation (0/6) 6  Total 29      Immunizations Immunization History  Administered Date(s) Administered   PFIZER(Purple Top)SARS-COV-2 Vaccination 09/04/2019, 09/27/2019   Td 01/10/2007   Tdap 02/03/2018   Zoster Recombinat (Shingrix) 04/26/2019    TDAP status: Up to date  Flu Vaccine status: Declined, Education has been provided regarding the importance of this vaccine but patient still declined. Advised may receive this vaccine at local pharmacy or Health Dept. Aware to provide a copy of the vaccination record if obtained from local pharmacy or Health Dept.  Verbalized acceptance and understanding.  Pneumococcal vaccine status: Due, Education has been provided regarding the importance of this vaccine. Advised may receive this vaccine at  local pharmacy or Health Dept. Aware to provide a copy of the vaccination record if obtained from local pharmacy or Health Dept. Verbalized acceptance and understanding.  Covid-19 vaccine status: Information provided on how to obtain vaccines. Booster due  Qualifies for Shingles Vaccine? No   Zostavax completed No   Shingrix Completed?: Yes  Screening Tests Health Maintenance  Topic Date Due   Pneumococcal Vaccine 48-56 Years old (1 - PCV) Never done   HIV Screening  Never done   COLONOSCOPY (Pts 45-26yr Insurance coverage will need to be confirmed)  01/31/2019   Zoster Vaccines- Shingrix (2 of 2) 06/21/2019   COVID-19 Vaccine (3 - Pfizer risk series) 10/25/2019   MAMMOGRAM  03/19/2020   INFLUENZA VACCINE  01/12/2021   TETANUS/TDAP  02/04/2028   Hepatitis C Screening  Completed   HPV VACCINES  Aged Out    Health Maintenance  Health Maintenance Due  Topic Date Due   Pneumococcal Vaccine 063642Years old (1 - PCV) Never done   HIV Screening  Never done   COLONOSCOPY (Pts 45-474yrInsurance coverage will need to be confirmed)  01/31/2019   Zoster Vaccines- Shingrix (2 of 2) 06/21/2019   COVID-19 Vaccine (3 - Pfizer risk series) 10/25/2019   MAMMOGRAM  03/19/2020   INFLUENZA VACCINE  01/12/2021    Colorectal cancer screening: Declined  Mammogram status: Ordered today. Pt provided with contact info and advised to call to schedule appt.   Bone density status: Due at age [age]Lung Cancer Screening: (Low dose CT completed 07/02/2020    Additional Screening:  Hepatitis C Screening: Completed 03/18/2009  Vision Screening: Recommended annual ophthalmology exams for early detection of glaucoma and other disorders of the eye. Is the patient up to date with their annual eye exam?  Yes  Who is the  provider or what is the name of the office in which the patient attends annual eye exams? DR. DeAllen   Dental Screening: Recommended annual dental exams for proper oral hygiene  Community Resource Referral / Chronic Care Management: CRR required this visit?  No   CCM required this visit?  No      Plan:     I have personally reviewed and noted the following in the patient's chart:   Medical and social history Use of alcohol, tobacco or illicit drugs  Current medications and supplements including opioid prescriptions. Patient is currently taking opioid prescriptions. Information provided to patient regarding non-opioid alternatives. Patient advised to discuss non-opioid treatment plan with their provider. Functional ability and status Nutritional status Physical activity Advanced directives List of other physicians Hospitalizations, surgeries, and ER visits in previous 12 months Vitals Screenings to include cognitive, depression, and falls Referrals and appointments  In addition, I have reviewed and discussed with patient certain preventive protocols, quality metrics, and best practice recommendations. A written personalized care plan for preventive services as well as general preventive health recommendations were provided to patient.   Due to this being a telephonic visit, the after visit summary with patients personalized plan was offered to patient via mail or my-chart.  Patient would like to access on my-chart.   MaMarta AntuLPN   [id number] Health Advisor  Nurse Notes: None   I have reviewed and agree with Health Coaches documentation.  JoKathlene NovemberMD

## 2021-02-11 NOTE — Patient Instructions (Signed)
Veronica Solomon , Thank you for taking time to complete your Medicare Wellness Visit. I appreciate your ongoing commitment to your health goals. Please review the following plan we discussed and let me know if I can assist you in the future.   Screening recommendations/referrals: Colonoscopy: Declined today Mammogram: Ordered today. Someone will call you to schedule. Bone Density: Due at age 64 Recommended yearly ophthalmology/optometry visit for glaucoma screening and checkup Recommended yearly dental visit for hygiene and checkup  Vaccinations: Influenza vaccine: Declined Pneumococcal vaccine: Due-May obtain vaccine at our office or your local pharmacy Tdap vaccine: Up to date-Due-02/04/2028 Shingles vaccine: First dose completed. Please check with pharmacy regarding 2nd dose.  Covid-19: Booster due  Advanced directives: Information mailed today  Conditions/risks identified: See problem list  Next appointment: Follow up in one year for your annual wellness visit.   Preventive Care 40-64 Years, Female Preventive care refers to lifestyle choices and visits with your health care provider that can promote health and wellness. What does preventive care include? A yearly physical exam. This is also called an annual well check. Dental exams once or twice a year. Routine eye exams. Ask your health care provider how often you should have your eyes checked. Personal lifestyle choices, including: Daily care of your teeth and gums. Regular physical activity. Eating a healthy diet. Avoiding tobacco and drug use. Limiting alcohol use. Practicing safe sex. Taking low-dose aspirin daily starting at age 23. Taking vitamin and mineral supplements as recommended by your health care provider. What happens during an annual well check? The services and screenings done by your health care provider during your annual well check will depend on your age, overall health, lifestyle risk factors, and family  history of disease. Counseling  Your health care provider may ask you questions about your: Alcohol use. Tobacco use. Drug use. Emotional well-being. Home and relationship well-being. Sexual activity. Eating habits. Work and work Statistician. Method of birth control. Menstrual cycle. Pregnancy history. Screening  You may have the following tests or measurements: Height, weight, and BMI. Blood pressure. Lipid and cholesterol levels. These may be checked every 5 years, or more frequently if you are over 60 years old. Skin check. Lung cancer screening. You may have this screening every year starting at age 43 if you have a 30-pack-year history of smoking and currently smoke or have quit within the past 15 years. Fecal occult blood test (FOBT) of the stool. You may have this test every year starting at age 1. Flexible sigmoidoscopy or colonoscopy. You may have a sigmoidoscopy every 5 years or a colonoscopy every 10 years starting at age 24. Hepatitis C blood test. Hepatitis B blood test. Sexually transmitted disease (STD) testing. Diabetes screening. This is done by checking your blood sugar (glucose) after you have not eaten for a while (fasting). You may have this done every 1-3 years. Mammogram. This may be done every 1-2 years. Talk to your health care provider about when you should start having regular mammograms. This may depend on whether you have a family history of breast cancer. BRCA-related cancer screening. This may be done if you have a family history of breast, ovarian, tubal, or peritoneal cancers. Pelvic exam and Pap test. This may be done every 3 years starting at age 81. Starting at age 75, this may be done every 5 years if you have a Pap test in combination with an HPV test. Bone density scan. This is done to screen for osteoporosis. You may have this scan  if you are at high risk for osteoporosis. Discuss your test results, treatment options, and if necessary, the need  for more tests with your health care provider. Vaccines  Your health care provider may recommend certain vaccines, such as: Influenza vaccine. This is recommended every year. Tetanus, diphtheria, and acellular pertussis (Tdap, Td) vaccine. You may need a Td booster every 10 years. Zoster vaccine. You may need this after age 31. Pneumococcal 13-valent conjugate (PCV13) vaccine. You may need this if you have certain conditions and were not previously vaccinated. Pneumococcal polysaccharide (PPSV23) vaccine. You may need one or two doses if you smoke cigarettes or if you have certain conditions. Talk to your health care provider about which screenings and vaccines you need and how often you need them. This information is not intended to replace advice given to you by your health care provider. Make sure you discuss any questions you have with your health care provider. Document Released: 06/27/2015 Document Revised: 02/18/2016 Document Reviewed: 04/01/2015 Elsevier Interactive Patient Education  2017 [place] Prevention in the Home Falls can cause injuries. They can happen to people of all ages. There are many things you can do to make your home safe and to help prevent falls. What can I do on the outside of my home? Regularly fix the edges of walkways and driveways and fix any cracks. Remove anything that might make you trip as you walk through a door, such as a raised step or threshold. Trim any bushes or trees on the path to your home. Use bright outdoor lighting. Clear any walking paths of anything that might make someone trip, such as rocks or tools. Regularly check to see if handrails are loose or broken. Make sure that both sides of any steps have handrails. Any raised decks and porches should have guardrails on the edges. Have any leaves, snow, or ice cleared regularly. Use sand or salt on walking paths during winter. Clean up any spills in your garage right away. This  includes oil or grease spills. What can I do in the bathroom? Use night lights. Install grab bars by the toilet and in the tub and shower. Do not use towel bars as grab bars. Use non-skid mats or decals in the tub or shower. If you need to sit down in the shower, use a plastic, non-slip stool. Keep the floor dry. Clean up any water that spills on the floor as soon as it happens. Remove soap buildup in the tub or shower regularly. Attach bath mats securely with double-sided non-slip rug tape. Do not have throw rugs and other things on the floor that can make you trip. What can I do in the bedroom? Use night lights. Make sure that you have a light by your bed that is easy to reach. Do not use any sheets or blankets that are too big for your bed. They should not hang down onto the floor. Have a firm chair that has side arms. You can use this for support while you get dressed. Do not have throw rugs and other things on the floor that can make you trip. What can I do in the kitchen? Clean up any spills right away. Avoid walking on wet floors. Keep items that you use a lot in easy-to-reach places. If you need to reach something above you, use a strong step stool that has a grab bar. Keep electrical cords out of the way. Do not use floor polish or wax that  makes floors slippery. If you must use wax, use non-skid floor wax. Do not have throw rugs and other things on the floor that can make you trip. What can I do with my stairs? Do not leave any items on the stairs. Make sure that there are handrails on both sides of the stairs and use them. Fix handrails that are broken or loose. Make sure that handrails are as long as the stairways. Check any carpeting to make sure that it is firmly attached to the stairs. Fix any carpet that is loose or worn. Avoid having throw rugs at the top or bottom of the stairs. If you do have throw rugs, attach them to the floor with carpet tape. Make sure that you  have a light switch at the top of the stairs and the bottom of the stairs. If you do not have them, ask someone to add them for you. What else can I do to help prevent falls? Wear shoes that: Do not have high heels. Have rubber bottoms. Are comfortable and fit you well. Are closed at the toe. Do not wear sandals. If you use a stepladder: Make sure that it is fully opened. Do not climb a closed stepladder. Make sure that both sides of the stepladder are locked into place. Ask someone to hold it for you, if possible. Clearly mark and make sure that you can see: Any grab bars or handrails. First and last steps. Where the edge of each step is. Use tools that help you move around (mobility aids) if they are needed. These include: Canes. Walkers. Scooters. Crutches. Turn on the lights when you go into a dark area. Replace any light bulbs as soon as they burn out. Set up your furniture so you have a clear path. Avoid moving your furniture around. If any of your floors are uneven, fix them. If there are any pets around you, be aware of where they are. Review your medicines with your doctor. Some medicines can make you feel dizzy. This can increase your chance of falling. Ask your doctor what other things that you can do to help prevent falls. This information is not intended to replace advice given to you by your health care provider. Make sure you discuss any questions you have with your health care provider. Document Released: 03/27/2009 Document Revised: 11/06/2015 Document Reviewed: 07/05/2014 Elsevier Interactive Patient Education  2017 [employer].

## 2021-02-23 DIAGNOSIS — M5136 Other intervertebral disc degeneration, lumbar region: Secondary | ICD-10-CM | POA: Diagnosis not present

## 2021-02-23 DIAGNOSIS — M9905 Segmental and somatic dysfunction of pelvic region: Secondary | ICD-10-CM | POA: Diagnosis not present

## 2021-02-23 DIAGNOSIS — M9903 Segmental and somatic dysfunction of lumbar region: Secondary | ICD-10-CM | POA: Diagnosis not present

## 2021-02-23 DIAGNOSIS — M5431 Sciatica, right side: Secondary | ICD-10-CM | POA: Diagnosis not present

## 2021-02-23 DIAGNOSIS — M9902 Segmental and somatic dysfunction of thoracic region: Secondary | ICD-10-CM | POA: Diagnosis not present

## 2021-02-23 DIAGNOSIS — M5134 Other intervertebral disc degeneration, thoracic region: Secondary | ICD-10-CM | POA: Diagnosis not present

## 2021-02-24 ENCOUNTER — Other Ambulatory Visit: Payer: Self-pay | Admitting: Family Medicine

## 2021-02-24 DIAGNOSIS — G894 Chronic pain syndrome: Secondary | ICD-10-CM

## 2021-02-24 NOTE — Telephone Encounter (Signed)
Requesting: lyrica Contract: 04/26/19 UDS: 04/26/19 Last Visit:  01/08/21 Next Visit: 04/13/21 Last Refill: 08/26/20  Please Advise

## 2021-02-28 ENCOUNTER — Encounter: Payer: Self-pay | Admitting: Family Medicine

## 2021-03-11 ENCOUNTER — Other Ambulatory Visit: Payer: Self-pay

## 2021-03-11 ENCOUNTER — Ambulatory Visit
Admission: RE | Admit: 2021-03-11 | Discharge: 2021-03-11 | Disposition: A | Discharge status: Home / Self Care | Payer: Medicare PPO | Source: Ambulatory Visit | Attending: Family Medicine | Admitting: Family Medicine

## 2021-03-11 DIAGNOSIS — Z1231 Encounter for screening mammogram for malignant neoplasm of breast: Secondary | ICD-10-CM

## 2021-03-16 ENCOUNTER — Encounter: Payer: Self-pay | Admitting: Family Medicine

## 2021-03-16 DIAGNOSIS — M9902 Segmental and somatic dysfunction of thoracic region: Secondary | ICD-10-CM | POA: Diagnosis not present

## 2021-03-16 DIAGNOSIS — M5136 Other intervertebral disc degeneration, lumbar region: Secondary | ICD-10-CM | POA: Diagnosis not present

## 2021-03-16 DIAGNOSIS — M9905 Segmental and somatic dysfunction of pelvic region: Secondary | ICD-10-CM | POA: Diagnosis not present

## 2021-03-16 DIAGNOSIS — M5431 Sciatica, right side: Secondary | ICD-10-CM | POA: Diagnosis not present

## 2021-03-16 DIAGNOSIS — M9903 Segmental and somatic dysfunction of lumbar region: Secondary | ICD-10-CM | POA: Diagnosis not present

## 2021-03-16 DIAGNOSIS — M5134 Other intervertebral disc degeneration, thoracic region: Secondary | ICD-10-CM | POA: Diagnosis not present

## 2021-03-17 MED ORDER — DULOXETINE HCL 60 MG PO CPEP: 120.0000 mg | ORAL_CAPSULE | Freq: Every day | ORAL | 1 refills | Status: DC

## 2021-03-17 MED ORDER — ATORVASTATIN CALCIUM 20 MG PO TABS: ORAL_TABLET | ORAL | 1 refills | Status: DC

## 2021-03-19 ENCOUNTER — Other Ambulatory Visit: Payer: Self-pay

## 2021-03-19 ENCOUNTER — Ambulatory Visit: Payer: Medicare PPO | Admitting: Family Medicine

## 2021-03-19 ENCOUNTER — Encounter: Payer: Self-pay | Admitting: Family Medicine

## 2021-03-19 VITALS — BP 148/82 | HR 82 | Temp 98.2°F | Resp 18 | Ht 68.5 in | Wt 255.6 lb

## 2021-03-19 DIAGNOSIS — Z1211 Encounter for screening for malignant neoplasm of colon: Secondary | ICD-10-CM | POA: Diagnosis not present

## 2021-03-19 DIAGNOSIS — R5383 Other fatigue: Secondary | ICD-10-CM | POA: Diagnosis not present

## 2021-03-19 DIAGNOSIS — I10 Essential (primary) hypertension: Secondary | ICD-10-CM | POA: Diagnosis not present

## 2021-03-19 DIAGNOSIS — M797 Fibromyalgia: Secondary | ICD-10-CM | POA: Diagnosis not present

## 2021-03-19 DIAGNOSIS — E785 Hyperlipidemia, unspecified: Secondary | ICD-10-CM | POA: Diagnosis not present

## 2021-03-19 LAB — POC URINALSYSI DIPSTICK (AUTOMATED)
Blood, UA: NEGATIVE
Glucose, UA: NEGATIVE
Leukocytes, UA: NEGATIVE
Nitrite, UA: NEGATIVE
Protein, UA: NEGATIVE
Spec Grav, UA: 1.01 (ref 1.010–1.025)
Urobilinogen, UA: 0.2 E.U./dL
pH, UA: 6.5 (ref 5.0–8.0)

## 2021-03-19 MED ORDER — CELECOXIB 200 MG PO CAPS: 200.0000 mg | ORAL_CAPSULE | Freq: Two times a day (BID) | ORAL | 3 refills | Status: AC | PRN

## 2021-03-19 NOTE — Assessment & Plan Note (Signed)
Well controlled, no changes to meds. Encouraged heart healthy diet such as the DASH diet and exercise as tolerated.  °

## 2021-03-19 NOTE — Assessment & Plan Note (Signed)
Check labs  ? Long haul covid, fibro

## 2021-03-19 NOTE — Patient Instructions (Signed)

## 2021-03-19 NOTE — Progress Notes (Signed)
Subjective:   By signing my name below, I, [name], attest that this documentation has been prepared under the direction and in the presence of Dr. Roma Schanz, DO. 03/19/2021    Patient ID: Veronica Solomon, female    DOB: [birth year], 64 y.o.   MRN: [id number]  Chief Complaint  Patient presents with   Fatigue    Pt states she had wave of fatigue and slept 20 hour for 4 days. Pt states having COVID twice.     HPI Patient is in today for a office visit.  She reports a couple of weeks ago having an episode of hot flash and fatigue. She reports while working she experience a wave of heat and started sweating profusely. She then went to eat due to not eating that morning and felt better and went to bed and slept for 18 hours. She had no fever or congestion, SOB, chest pain, palpitations during that time. She did not complete a Covid-19 test after that incident.  She denies having any chest pain, palpitation, SOB, urinary symptoms, coughing, congestion or fever at this time.  She is not taking daily vitamin supplements at this time.  She has run out of prescriptions for 200 mg Celebrex and has stopped taking it for a couple of days. She notes that she feels fine while not taking it. She has requested a refill at her pharmacy and is picking it up after this visit. She also continues taking 50 mg tramadol PRN to manage her pain and typically takes it 1-2x weekly on Tuesday. She reports doing well while taking it.  She is not interested in getting a flu vaccine at this time. She did not get the second dosage for the shingrix vaccine. She was informed of the the new Covid-19 vaccine during this visit and is not interested in getting it at this time.  She is due for a colonoscopy. She reports that since her last colonoscopy she gets frequent lower intestinal infections and found that her last colonoscopy had complications which led to the infections. She is not interested in getting a  colonoscopy anymore.    Past Medical History:  Diagnosis Date   Adenomatous colon polyp    Allergy    C. difficile colitis    Diverticulosis    Hyperlipidemia    Hypertension    IBS (irritable bowel syndrome)    Neuromuscular disorder ([place])     Past Surgical History:  Procedure Laterality Date   ABDOMINAL HYSTERECTOMY  2002   fibroids    Family History  Problem Relation Age of Onset   Hypertension Mother    Depression Mother    Heart attack Mother    Uterine cancer Mother    Diabetes Maternal Grandmother    Hypertension Paternal Grandmother     Social History   Socioeconomic History   Marital status: Single    Spouse name: Not on file   Number of children: Not on file   Years of education: Not on file   Highest education level: Not on file  Occupational History   Not on file  Tobacco Use   Smoking status: Former    Packs/day: 1.00    Years: 42.00    Pack years: 42.00    Types: Cigarettes    Quit date: 12/29/2011    Years since quitting: 9.2   Smokeless tobacco: Former   Tobacco comments:    Quit 12/2011  Vaping Use   Vaping Use: Never used  Substance  and Sexual Activity   Alcohol use: Yes    Comment: rarely   Drug use: No   Sexual activity: Not Currently  Other Topics Concern   Not on file  Social History Narrative   Not on file   Social Determinants of Health   Financial Resource Strain: Low Risk    Difficulty of Paying Living Expenses: Not hard at all  Food Insecurity: No Food Insecurity   Worried About Charity fundraiser in the Last Year: Never true   [place] in the Last Year: Never true  Transportation Needs: No Transportation Needs   Lack of Transportation (Medical): No   Lack of Transportation (Non-Medical): No  Physical Activity: Insufficiently Active   Days of Exercise per Week: 7 days   Minutes of Exercise per Session: 20 min  Stress: Stress Concern Present   Feeling of Stress : To some extent  Social Connections: Socially  Isolated   Frequency of Communication with Friends and Family: More than three times a week   Frequency of Social Gatherings with Friends and Family: More than three times a week   Attends Religious Services: Never   Marine scientist or Organizations: No   Attends Music therapist: Never   Marital Status: Never married  Human resources officer Violence: Not At Risk   Fear of Current or Ex-Partner: No   Emotionally Abused: No   Physically Abused: No   Sexually Abused: No    Outpatient Medications Prior to Visit  Medication Sig Dispense Refill   albuterol (VENTOLIN HFA) 108 (90 Base) MCG/ACT inhaler Inhale 2 puffs into the lungs every 6 (six) hours as needed for wheezing or shortness of breath. 18 g 5   amLODipine (NORVASC) 10 MG tablet Take 1 tablet (10 mg total) by mouth daily. 90 tablet 1   atorvastatin (LIPITOR) 20 MG tablet TAKE 1 TABLET(20 MG) BY MOUTH DAILY 90 tablet 1   DULoxetine (CYMBALTA) 60 MG capsule Take 2 capsules (120 mg total) by mouth daily. 180 capsule 1   Fluticasone-Umeclidin-Vilant (TRELEGY ELLIPTA) 100-62.5-25 MCG/INH AEPB Inhale 1 puff into the lungs daily. 60 each 2   hydrochlorothiazide (HYDRODIURIL) 25 MG tablet Take 1 tablet (25 mg total) by mouth daily. 90 tablet 3   HYDROcodone-acetaminophen (NORCO) 10-325 MG tablet Take 1 tablet by mouth every 8 (eight) hours as needed. 30 tablet 0   pregabalin (LYRICA) 75 MG capsule TAKE 1 CAPSULE(75 MG) BY MOUTH TWICE DAILY 180 capsule 2   traMADol (ULTRAM) 50 MG tablet TAKE 1 TABLET(50 MG) BY MOUTH EVERY 6 HOURS AS NEEDED 90 tablet 0   celecoxib (CELEBREX) 200 MG capsule Take 1 capsule (200 mg total) by mouth 2 (two) times daily as needed for moderate pain. 180 capsule 0   triamcinolone cream (KENALOG) 0.1 % Apply 1 application topically 2 (two) times daily. (Patient not taking: Reported on 03/19/2021) 30 g 0   No facility-administered medications prior to visit.    Allergies  Allergen Reactions   Paregoric      REACTION: HIVES   Penicillins     REACTION: RASH   Procaine Hcl     Review of Systems  Constitutional:  Positive for malaise/fatigue. Negative for fever.       (+)hot flashes  HENT:  Negative for congestion.   Respiratory:  Negative for cough and shortness of breath.   Cardiovascular:  Negative for chest pain and palpitations.  Genitourinary:  Negative for dysuria and frequency.  Objective:    Physical Exam Vitals and nursing note reviewed.  Constitutional:      General: She is not in acute distress.    Appearance: Normal appearance. She is not ill-appearing.  HENT:     Head: Normocephalic and atraumatic.     Right Ear: External ear normal.     Left Ear: External ear normal.  Eyes:     Extraocular Movements: Extraocular movements intact.     Pupils: Pupils are equal, round, and reactive to light.  Cardiovascular:     Rate and Rhythm: Normal rate and regular rhythm.     Heart sounds: Normal heart sounds. No murmur heard.   No gallop.  Pulmonary:     Effort: Pulmonary effort is normal. No respiratory distress.     Breath sounds: Normal breath sounds. No wheezing or rales.  Skin:    General: Skin is warm and dry.  Neurological:     Mental Status: She is alert and oriented to person, place, and time.  Psychiatric:        Behavior: Behavior normal.        Judgment: Judgment normal.    BP (!) 148/82 (BP Location: Right Arm, Patient Position: Sitting, Cuff Size: Large)   Pulse 82   Temp 98.2 F (36.8 C) (Oral)   Resp 18   Ht 5' 8.5" (1.74 m)   Wt 255 lb 9.6 oz (115.9 kg)   SpO2 95%   BMI 38.30 kg/m  Wt Readings from Last 3 Encounters:  03/19/21 255 lb 9.6 oz (115.9 kg)  02/11/21 259 lb (117.5 kg)  01/08/21 259 lb 3.2 oz (117.6 kg)    Diabetic Foot Exam - Simple   No data filed    Lab Results  Component Value Date   WBC 8.9 07/03/2015   HGB 13.8 07/03/2015   HCT 42.2 07/03/2015   PLT 262.0 07/03/2015   GLUCOSE 94 01/08/2021   CHOL 163 08/01/2020    TRIG 83 08/01/2020   HDL 67 08/01/2020   LDLDIRECT 195.7 01/12/2010   LDLCALC 79 08/01/2020   ALT 27 08/01/2020   AST 23 08/01/2020   NA 141 01/08/2021   K 4.4 01/08/2021   CL 100 01/08/2021   CREATININE 0.99 01/08/2021   BUN 22 01/08/2021   CO2 33 (H) 01/08/2021   TSH 1.13 01/26/2018   HGBA1C 5.8 12/30/2014   MICROALBUR 4.1 (H) 11/24/2012    Lab Results  Component Value Date   TSH 1.13 01/26/2018   Lab Results  Component Value Date   WBC 8.9 07/03/2015   HGB 13.8 07/03/2015   HCT 42.2 07/03/2015   MCV 86.4 07/03/2015   PLT 262.0 07/03/2015   Lab Results  Component Value Date   NA 141 01/08/2021   K 4.4 01/08/2021   CO2 33 (H) 01/08/2021   GLUCOSE 94 01/08/2021   BUN 22 01/08/2021   CREATININE 0.99 01/08/2021   BILITOT 0.6 08/01/2020   ALKPHOS 123 (H) 04/26/2019   AST 23 08/01/2020   ALT 27 08/01/2020   PROT 6.8 08/01/2020   ALBUMIN 4.7 04/26/2019   CALCIUM 10.6 (H) 01/08/2021   GFR 60.33 01/08/2021   Lab Results  Component Value Date   CHOL 163 08/01/2020   Lab Results  Component Value Date   HDL 67 08/01/2020   Lab Results  Component Value Date   LDLCALC 79 08/01/2020   Lab Results  Component Value Date   TRIG 83 08/01/2020   Lab Results  Component Value Date  CHOLHDL 2.4 08/01/2020   Lab Results  Component Value Date   HGBA1C 5.8 12/30/2014       Assessment & Plan:   Problem List Items Addressed This Visit       Unprioritized   Fatigue    Check labs  ? Long haul covid, fibro       Relevant Orders   CBC with Differential/Platelet   TSH   Vitamin B12   Comprehensive metabolic panel   Lipid panel   VITAMIN D 25 Hydroxy (Vit-D Deficiency, Fractures)   Epstein-Barr virus VCA antibody panel   Monospot   POCT Urinalysis Dipstick (Automated) (Completed)   Hyperlipidemia    Encourage heart healthy diet such as MIND or DASH diet, increase exercise, avoid trans fats, simple carbohydrates and processed foods, consider a krill  or fish or flaxseed oil cap daily.       Relevant Orders   CBC with Differential/Platelet   TSH   Vitamin B12   Comprehensive metabolic panel   Lipid panel   VITAMIN D 25 Hydroxy (Vit-D Deficiency, Fractures)   Other Visit Diagnoses     Primary hypertension    -  Primary   Relevant Orders   CBC with Differential/Platelet   TSH   Vitamin B12   Comprehensive metabolic panel   Lipid panel   VITAMIN D 25 Hydroxy (Vit-D Deficiency, Fractures)   Fibromyalgia       Relevant Medications   celecoxib (CELEBREX) 200 MG capsule   Screening for colon cancer       Relevant Orders   Cologuard        Meds ordered this encounter  Medications   celecoxib (CELEBREX) 200 MG capsule    Sig: Take 1 capsule (200 mg total) by mouth 2 (two) times daily as needed for moderate pain.    Dispense:  180 capsule    Refill:  3    I, Dr. Roma Schanz, DO, personally preformed the services described in this documentation.  All medical record entries made by the scribe were at my direction and in my presence.  I have reviewed the chart and discharge instructions (if applicable) and agree that the record reflects my personal performance and is accurate and complete. 03/19/2021   I,[name],acting as a scribe for [name], DO.,have documented all relevant documentation on the behalf of [name], DO,as directed by  [name], DO while in the presence of [name], DO.   [name], DO

## 2021-03-19 NOTE — Assessment & Plan Note (Signed)
Encourage heart healthy diet such as MIND or DASH diet, increase exercise, avoid trans fats, simple carbohydrates and processed foods, consider a krill or fish or flaxseed oil cap daily.  °

## 2021-03-20 LAB — EPSTEIN-BARR VIRUS VCA ANTIBODY PANEL
EBV NA IgG: >600 U/mL — ABNORMAL HIGH
EBV VCA IgG: >750 U/mL — ABNORMAL HIGH
EBV VCA IgM: <36 U/mL

## 2021-03-20 LAB — COMPREHENSIVE METABOLIC PANEL
ALT: 18 U/L (ref 0–35)
AST: 21 U/L (ref 0–37)
Albumin: 4.7 g/dL (ref 3.5–5.2)
Alkaline Phosphatase: 112 U/L (ref 39–117)
BUN: 21 mg/dL (ref 6–23)
CO2: 32 mEq/L (ref 19–32)
Calcium: 10 mg/dL (ref 8.4–10.5)
Chloride: 98 mEq/L (ref 96–112)
Creatinine, Ser: 0.9 mg/dL (ref 0.40–1.20)
GFR: 67.55 mL/min (ref >60.00)
Glucose, Bld: 100 mg/dL — ABNORMAL HIGH (ref 70–99)
Potassium: 3.9 mEq/L (ref 3.5–5.1)
Sodium: 141 mEq/L (ref 135–145)
Total Bilirubin: 0.8 mg/dL (ref 0.2–1.2)
Total Protein: 7.3 g/dL (ref 6.0–8.3)

## 2021-03-20 LAB — TSH: TSH: 1.33 u[IU]/mL (ref 0.35–5.50)

## 2021-03-20 LAB — LIPID PANEL
Cholesterol: 158 mg/dL (ref 0–200)
HDL: 60.5 mg/dL (ref >39.00)
LDL Cholesterol: 78 mg/dL (ref 0–99)
NonHDL: 97.1
Total CHOL/HDL Ratio: 3
Triglycerides: 95 mg/dL (ref 0.0–149.0)
VLDL: 19 mg/dL (ref 0.0–40.0)

## 2021-03-20 LAB — VITAMIN D 25 HYDROXY (VIT D DEFICIENCY, FRACTURES): VITD: 18.41 ng/mL — ABNORMAL LOW (ref 30.00–100.00)

## 2021-03-20 LAB — MONONUCLEOSIS SCREEN: Mono Screen: NEGATIVE

## 2021-03-20 LAB — VITAMIN B12: Vitamin B-12: 204 pg/mL — ABNORMAL LOW (ref 211–911)

## 2021-03-23 ENCOUNTER — Encounter: Payer: Self-pay | Admitting: Family Medicine

## 2021-03-24 NOTE — Telephone Encounter (Signed)
Labs have not been reviewed by Dr. Etter Sjogren yet

## 2021-03-24 NOTE — Telephone Encounter (Signed)
Pt has sent 4 separate Mychart messages regarding labs that have not been reviewed by Dr. Etter Sjogren.

## 2021-04-06 DIAGNOSIS — M5136 Other intervertebral disc degeneration, lumbar region: Secondary | ICD-10-CM | POA: Diagnosis not present

## 2021-04-06 DIAGNOSIS — M9905 Segmental and somatic dysfunction of pelvic region: Secondary | ICD-10-CM | POA: Diagnosis not present

## 2021-04-06 DIAGNOSIS — M9902 Segmental and somatic dysfunction of thoracic region: Secondary | ICD-10-CM | POA: Diagnosis not present

## 2021-04-06 DIAGNOSIS — M5134 Other intervertebral disc degeneration, thoracic region: Secondary | ICD-10-CM | POA: Diagnosis not present

## 2021-04-06 DIAGNOSIS — M5431 Sciatica, right side: Secondary | ICD-10-CM | POA: Diagnosis not present

## 2021-04-06 DIAGNOSIS — M9903 Segmental and somatic dysfunction of lumbar region: Secondary | ICD-10-CM | POA: Diagnosis not present

## 2021-04-10 ENCOUNTER — Ambulatory Visit: Payer: Medicare PPO | Admitting: Family Medicine

## 2021-04-13 ENCOUNTER — Encounter: Payer: Self-pay | Admitting: Family Medicine

## 2021-04-13 ENCOUNTER — Ambulatory Visit: Payer: Medicare PPO | Admitting: Family Medicine

## 2021-04-13 ENCOUNTER — Other Ambulatory Visit: Payer: Self-pay

## 2021-04-13 VITALS — BP 150/75 | HR 80 | Temp 98.0°F | Resp 16 | Ht 69.0 in | Wt 255.0 lb

## 2021-04-13 DIAGNOSIS — I10 Essential (primary) hypertension: Secondary | ICD-10-CM

## 2021-04-13 DIAGNOSIS — E785 Hyperlipidemia, unspecified: Secondary | ICD-10-CM | POA: Diagnosis not present

## 2021-04-13 DIAGNOSIS — E559 Vitamin D deficiency, unspecified: Secondary | ICD-10-CM | POA: Insufficient documentation

## 2021-04-13 DIAGNOSIS — E538 Deficiency of other specified B group vitamins: Secondary | ICD-10-CM | POA: Diagnosis not present

## 2021-04-13 MED ORDER — VITAMIN D (ERGOCALCIFEROL) 1.25 MG (50000 UNIT) PO CAPS: 50000.0000 [IU] | ORAL_CAPSULE | ORAL | 1 refills | Status: DC

## 2021-04-13 MED ORDER — LOSARTAN POTASSIUM 50 MG PO TABS: 50.0000 mg | ORAL_TABLET | Freq: Every day | ORAL | 1 refills | Status: DC

## 2021-04-13 MED ORDER — CYANOCOBALAMIN 1000 MCG/ML IJ SOLN
1000.0000 ug | Freq: Once | INTRAMUSCULAR | Status: AC
  Administered 2021-04-13: 1000 ug via INTRAMUSCULAR

## 2021-04-13 NOTE — Assessment & Plan Note (Signed)
Encourage heart healthy diet such as MIND or DASH diet, increase exercise, avoid trans fats, simple carbohydrates and processed foods, consider a krill or fish or flaxseed oil cap daily.  °

## 2021-04-13 NOTE — Patient Instructions (Signed)

## 2021-04-13 NOTE — Assessment & Plan Note (Signed)
Cont' norvasc and hctz Add losartan  Recheck 2-3 weeks

## 2021-04-13 NOTE — Progress Notes (Signed)
Subjective:    Patient ID: Veronica Solomon Hence, female    DOB: 03-Nov-1955, 65 y.o.   MRN: [id number]  Chief Complaint  Patient presents with   Hypertension    Here for follow up    HPI Patient is in today for htn----  she has had a headache and bp has been running high   Past Medical History:  Diagnosis Date   Adenomatous colon polyp    Allergy    C. difficile colitis    Diverticulosis    Hyperlipidemia    Hypertension    IBS (irritable bowel syndrome)    Neuromuscular disorder ([place])     Past Surgical History:  Procedure Laterality Date   ABDOMINAL HYSTERECTOMY  2002   fibroids    Family History  Problem Relation Age of Onset   Hypertension Mother    Depression Mother    Heart attack Mother    Uterine cancer Mother    Diabetes Maternal Grandmother    Hypertension Paternal Grandmother     Social History   Socioeconomic History   Marital status: Single    Spouse name: Not on file   Number of children: Not on file   Years of education: Not on file   Highest education level: Not on file  Occupational History   Not on file  Tobacco Use   Smoking status: Former    Packs/day: 1.00    Years: 42.00    Pack years: 42.00    Types: Cigarettes    Quit date: 12/29/2011    Years since quitting: 9.2   Smokeless tobacco: Former   Tobacco comments:    Quit 12/2011  Vaping Use   Vaping Use: Never used  Substance and Sexual Activity   Alcohol use: Yes    Comment: rarely   Drug use: No   Sexual activity: Not Currently  Other Topics Concern   Not on file  Social History Narrative   Not on file   Social Determinants of Health   Financial Resource Strain: Low Risk    Difficulty of Paying Living Expenses: Not hard at all  Food Insecurity: No Food Insecurity   Worried About Charity fundraiser in the Last Year: Never true   [place] in the Last Year: Never true  Transportation Needs: No Transportation Needs   Lack of Transportation (Medical): No   Lack  of Transportation (Non-Medical): No  Physical Activity: Insufficiently Active   Days of Exercise per Week: 7 days   Minutes of Exercise per Session: 20 min  Stress: Stress Concern Present   Feeling of Stress : To some extent  Social Connections: Socially Isolated   Frequency of Communication with Friends and Family: More than three times a week   Frequency of Social Gatherings with Friends and Family: More than three times a week   Attends Religious Services: Never   Marine scientist or Organizations: No   Attends Music therapist: Never   Marital Status: Never married  Human resources officer Violence: Not At Risk   Fear of Current or Ex-Partner: No   Emotionally Abused: No   Physically Abused: No   Sexually Abused: No    Outpatient Medications Prior to Visit  Medication Sig Dispense Refill   albuterol (VENTOLIN HFA) 108 (90 Base) MCG/ACT inhaler Inhale 2 puffs into the lungs every 6 (six) hours as needed for wheezing or shortness of breath. 18 g 5   amLODipine (NORVASC) 10 MG tablet Take 1  tablet (10 mg total) by mouth daily. 90 tablet 1   atorvastatin (LIPITOR) 20 MG tablet TAKE 1 TABLET(20 MG) BY MOUTH DAILY 90 tablet 1   celecoxib (CELEBREX) 200 MG capsule Take 1 capsule (200 mg total) by mouth 2 (two) times daily as needed for moderate pain. 180 capsule 3   DULoxetine (CYMBALTA) 60 MG capsule Take 2 capsules (120 mg total) by mouth daily. 180 capsule 1   Fluticasone-Umeclidin-Vilant (TRELEGY ELLIPTA) 100-62.5-25 MCG/INH AEPB Inhale 1 puff into the lungs daily. 60 each 2   hydrochlorothiazide (HYDRODIURIL) 25 MG tablet Take 1 tablet (25 mg total) by mouth daily. 90 tablet 3   HYDROcodone-acetaminophen (NORCO) 10-325 MG tablet Take 1 tablet by mouth every 8 (eight) hours as needed. 30 tablet 0   pregabalin (LYRICA) 75 MG capsule TAKE 1 CAPSULE(75 MG) BY MOUTH TWICE DAILY 180 capsule 2   traMADol (ULTRAM) 50 MG tablet TAKE 1 TABLET(50 MG) BY MOUTH EVERY 6 HOURS AS NEEDED  90 tablet 0   No facility-administered medications prior to visit.    Allergies  Allergen Reactions   Paregoric     REACTION: HIVES   Penicillins     REACTION: RASH   Procaine Hcl     Review of Systems  Constitutional:  Negative for fever and malaise/fatigue.  HENT:  Negative for congestion.   Eyes:  Negative for blurred vision.  Respiratory:  Negative for cough and shortness of breath.   Cardiovascular:  Negative for chest pain, palpitations and leg swelling.  Gastrointestinal:  Negative for vomiting.  Musculoskeletal:  Negative for back pain.  Skin:  Negative for rash.  Neurological:  Positive for headaches. Negative for loss of consciousness.      Objective:    Physical Exam Vitals and nursing note reviewed.  Constitutional:      Appearance: She is well-developed.  HENT:     Head: Normocephalic and atraumatic.  Eyes:     Conjunctiva/sclera: Conjunctivae normal.  Neck:     Thyroid: No thyromegaly.     Vascular: No carotid bruit or JVD.  Cardiovascular:     Rate and Rhythm: Normal rate and regular rhythm.     Heart sounds: Normal heart sounds. No murmur heard. Pulmonary:     Effort: Pulmonary effort is normal. No respiratory distress.     Breath sounds: Normal breath sounds. No wheezing or rales.  Chest:     Chest wall: No tenderness.  Musculoskeletal:     Cervical back: Normal range of motion and neck supple.  Neurological:     Mental Status: She is alert and oriented to person, place, and time.  Psychiatric:        Mood and Affect: Mood normal.        Behavior: Behavior normal.        Thought Content: Thought content normal.        Judgment: Judgment normal.    BP (!) 150/75 (BP Location: Right Arm, Patient Position: Sitting, Cuff Size: Large)   Pulse 80   Temp 98 F (36.7 C) (Oral)   Resp 16   Ht 5\' 9"  (1.753 m)   Wt 255 lb (115.7 kg)   SpO2 97%   BMI 37.66 kg/m  Wt Readings from Last 3 Encounters:  04/13/21 255 lb (115.7 kg)  03/19/21 255 lb  9.6 oz (115.9 kg)  02/11/21 259 lb (117.5 kg)    Diabetic Foot Exam - Simple   No data filed    Lab Results  Component Value Date  WBC 8.9 07/03/2015   HGB 13.8 07/03/2015   HCT 42.2 07/03/2015   PLT 262.0 07/03/2015   GLUCOSE 100 (H) 03/19/2021   CHOL 158 03/19/2021   TRIG 95.0 03/19/2021   HDL 60.50 03/19/2021   LDLDIRECT 195.7 01/12/2010   LDLCALC 78 03/19/2021   ALT 18 03/19/2021   AST 21 03/19/2021   NA 141 03/19/2021   K 3.9 03/19/2021   CL 98 03/19/2021   CREATININE 0.90 03/19/2021   BUN 21 03/19/2021   CO2 32 03/19/2021   TSH 1.33 03/19/2021   HGBA1C 5.8 12/30/2014   MICROALBUR 4.1 (H) 11/24/2012    Lab Results  Component Value Date   TSH 1.33 03/19/2021   Lab Results  Component Value Date   WBC 8.9 07/03/2015   HGB 13.8 07/03/2015   HCT 42.2 07/03/2015   MCV 86.4 07/03/2015   PLT 262.0 07/03/2015   Lab Results  Component Value Date   NA 141 03/19/2021   K 3.9 03/19/2021   CO2 32 03/19/2021   GLUCOSE 100 (H) 03/19/2021   BUN 21 03/19/2021   CREATININE 0.90 03/19/2021   BILITOT 0.8 03/19/2021   ALKPHOS 112 03/19/2021   AST 21 03/19/2021   ALT 18 03/19/2021   PROT 7.3 03/19/2021   ALBUMIN 4.7 03/19/2021   CALCIUM 10.0 03/19/2021   GFR 67.55 03/19/2021   Lab Results  Component Value Date   CHOL 158 03/19/2021   Lab Results  Component Value Date   HDL 60.50 03/19/2021   Lab Results  Component Value Date   LDLCALC 78 03/19/2021   Lab Results  Component Value Date   TRIG 95.0 03/19/2021   Lab Results  Component Value Date   CHOLHDL 3 03/19/2021   Lab Results  Component Value Date   HGBA1C 5.8 12/30/2014       Assessment & Plan:   Problem List Items Addressed This Visit       Unprioritized   B12 deficiency   Hyperlipidemia    Encourage heart healthy diet such as MIND or DASH diet, increase exercise, avoid trans fats, simple carbohydrates and processed foods, consider a krill or fish or flaxseed oil cap daily.        Relevant Medications   losartan (COZAAR) 50 MG tablet   Primary hypertension - Primary    Cont' norvasc and hctz Add losartan  Recheck 2-3 weeks       Relevant Medications   losartan (COZAAR) 50 MG tablet   Vitamin D deficiency    Per orders  And take vita d3 1000 daily F/u prn      Relevant Medications   Vitamin D, Ergocalciferol, (DRISDOL) 1.25 MG (50000 UNIT) CAPS capsule    I am having Ebony Hail H. Sherwood start on losartan and Vitamin D (Ergocalciferol). I am also having her maintain her amLODipine, albuterol, hydrochlorothiazide, HYDROcodone-acetaminophen, Trelegy Ellipta, traMADol, pregabalin, DULoxetine, atorvastatin, and celecoxib. We administered cyanocobalamin.  Meds ordered this encounter  Medications   losartan (COZAAR) 50 MG tablet    Sig: Take 1 tablet (50 mg total) by mouth daily.    Dispense:  30 tablet    Refill:  1   Vitamin D, Ergocalciferol, (DRISDOL) 1.25 MG (50000 UNIT) CAPS capsule    Sig: Take 1 capsule (50,000 Units total) by mouth every 7 (seven) days.    Dispense:  12 capsule    Refill:  1   cyanocobalamin ((VITAMIN B-12)) injection 1,000 mcg     [name], DO

## 2021-04-13 NOTE — Assessment & Plan Note (Signed)
Per orders  And take vita d3 1000 daily F/u prn

## 2021-04-14 ENCOUNTER — Other Ambulatory Visit: Payer: Self-pay | Admitting: Family Medicine

## 2021-04-14 DIAGNOSIS — I10 Essential (primary) hypertension: Secondary | ICD-10-CM

## 2021-04-16 ENCOUNTER — Other Ambulatory Visit: Payer: Self-pay | Admitting: Family Medicine

## 2021-04-16 DIAGNOSIS — T7840XA Allergy, unspecified, initial encounter: Secondary | ICD-10-CM

## 2021-04-20 ENCOUNTER — Other Ambulatory Visit: Payer: Self-pay | Admitting: Family Medicine

## 2021-04-20 DIAGNOSIS — I10 Essential (primary) hypertension: Secondary | ICD-10-CM

## 2021-04-23 ENCOUNTER — Telehealth: Payer: Self-pay

## 2021-04-23 NOTE — Telephone Encounter (Signed)
Pt comes in for B12 appointment tomorrow 04/24/21.   Last OV note says  HTN: Cont' norvasc and hctz Add losartan  Recheck 2-3 weeks   But also says to Return in about 1 week (around 04/20/2021), or b12 injection every week for 3 more weeks----recheck bp.  Is this appointment a BP check as well as B12? Or checking BP at next B12?

## 2021-04-23 NOTE — Progress Notes (Addendum)
[facility] Buczkowski is a 66 y.o. female presents to the office today for B 12 injection 2/3 , per physician's orders. Original order:  04/13/21: b12 injection every week for 3 more weeks Vitamin B 12 1,000 mcg,  IM  (route) was administered L deltoid (location) today. Patient tolerated injection. Patient due for follow up labs/provider appt: No. Date due: 6 months, appt made No Patient next injection due: 1 week , appt made No- Pending already.   Pt here for Blood pressure check per Dr Etter Sjogren 04/13/21  Pt currently takes: norvasc and hctz, added losartan at last OV  Pt reports compliance with medication.  BP today @ = 120/74 HR = 83  Pt advised per Dr Etter Sjogren to continue all current medications.   Creft, Darlis Loan, RMA

## 2021-04-24 ENCOUNTER — Ambulatory Visit (INDEPENDENT_AMBULATORY_CARE_PROVIDER_SITE_OTHER): Payer: Medicare PPO

## 2021-04-24 ENCOUNTER — Other Ambulatory Visit: Payer: Self-pay

## 2021-04-24 DIAGNOSIS — I10 Essential (primary) hypertension: Secondary | ICD-10-CM

## 2021-04-24 DIAGNOSIS — E538 Deficiency of other specified B group vitamins: Secondary | ICD-10-CM | POA: Diagnosis not present

## 2021-04-24 MED ORDER — CYANOCOBALAMIN 1000 MCG/ML IJ SOLN
1000.0000 ug | Freq: Once | INTRAMUSCULAR | Status: AC
  Administered 2021-04-24: 1000 ug via INTRAMUSCULAR

## 2021-04-26 ENCOUNTER — Encounter: Payer: Self-pay | Admitting: Family Medicine

## 2021-04-27 DIAGNOSIS — M9902 Segmental and somatic dysfunction of thoracic region: Secondary | ICD-10-CM | POA: Diagnosis not present

## 2021-04-27 DIAGNOSIS — M5136 Other intervertebral disc degeneration, lumbar region: Secondary | ICD-10-CM | POA: Diagnosis not present

## 2021-04-27 DIAGNOSIS — M9903 Segmental and somatic dysfunction of lumbar region: Secondary | ICD-10-CM | POA: Diagnosis not present

## 2021-04-27 DIAGNOSIS — M5134 Other intervertebral disc degeneration, thoracic region: Secondary | ICD-10-CM | POA: Diagnosis not present

## 2021-04-27 DIAGNOSIS — M9905 Segmental and somatic dysfunction of pelvic region: Secondary | ICD-10-CM | POA: Diagnosis not present

## 2021-04-27 DIAGNOSIS — M5431 Sciatica, right side: Secondary | ICD-10-CM | POA: Diagnosis not present

## 2021-04-29 ENCOUNTER — Other Ambulatory Visit: Payer: Self-pay | Admitting: Family Medicine

## 2021-04-29 DIAGNOSIS — J439 Emphysema, unspecified: Secondary | ICD-10-CM

## 2021-05-01 ENCOUNTER — Other Ambulatory Visit: Payer: Self-pay

## 2021-05-01 ENCOUNTER — Ambulatory Visit (INDEPENDENT_AMBULATORY_CARE_PROVIDER_SITE_OTHER): Payer: Medicare PPO

## 2021-05-01 DIAGNOSIS — E538 Deficiency of other specified B group vitamins: Secondary | ICD-10-CM | POA: Diagnosis not present

## 2021-05-01 MED ORDER — CYANOCOBALAMIN 1000 MCG/ML IJ SOLN
1000.0000 ug | Freq: Once | INTRAMUSCULAR | Status: AC
  Administered 2021-05-01: 1000 ug via INTRAMUSCULAR

## 2021-05-01 NOTE — Progress Notes (Signed)
[facility] Bracknell is a 65 y.o. female presents to the office today for B12 3/4 injection, per physician's orders. Original order: 04/13/21 Dr Etter Sjogren- "Return in about 1 week (around 04/20/2021), or b12 injection every week for 3 more weeks" Vitamin B12 1000 mcg,  IM (route) was administered L deltoid (location) today. Patient tolerated injection. Patient due for follow up labs/provider appt: yes- "Follow-up in 3-6 months, at your next visit for the B12 injection please recommend the CMA doing it that she needs to teach you how to draw it up and inject." Appt made Yes  Patient next injection due: 1 week, appt made No- Pending already  Creft, [place]

## 2021-05-10 NOTE — Progress Notes (Addendum)
Synopsis: Referred for chronic bronchitis by [name], *  Subjective:   PATIENT ID: Veronica Solomon GENDER: female DOB: 1955-12-11, MRN: [id number]  Chief Complaint  Patient presents with   Consult    Patient reports that she has has increased shortness of breath with exertion.    65yF with history of smoking who is referred for chronic bronchitis who is enrolled in lung cancer screening. Has been told that she has COPD - if she has done PFTs it's been many years. Had covid twice - in July/August. Was not hospitalized but did get course of paxlovid and steroids. This is only course of steroids she's ever received and it's hard for her to recall effect that it had on her breathing.   She has DOE over last several years. Can walk on level ground half mile to a mile without issue but with incline or increased speed her exercise tolerance has worsened. She has no orthopnea. Weight has increased 30 lb over last couple of years. She has never had a blood clot. Never had asthma as a kid. Does get some exertional chest tightness, gets better when she stops.   She is taking trelegy over last 3-4 months, rinses mouth after use. She thinks she's had only minor improvement. Only minor improvement with albuterol as well.     Otherwise pertinent review of systems is negative.  No family history of cancer. Did have family with emphysema, COPD  Smoked for 42y. Pack per day. Quit in 2013. 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 Has lived in [place], moved to [place] in 1980. Apart from living in [place] but has had no exposure to dusts/solvents/particulates without a mask.   Past Medical History:  Diagnosis Date   Adenomatous colon polyp    Allergy    C. difficile colitis    Diverticulosis    Hyperlipidemia    Hypertension    IBS (irritable bowel syndrome)    Nasal congestion 06/14/1998   Neuromuscular disorder ([place])      Family History  Problem  Relation Age of Onset   Hypertension Mother    Depression Mother    Heart attack Mother    Uterine cancer Mother    Diabetes Maternal Grandmother    Hypertension Paternal Grandmother      Past Surgical History:  Procedure Laterality Date   ABDOMINAL HYSTERECTOMY  2002   fibroids    Social History   Socioeconomic History   Marital status: Single    Spouse name: Not on file   Number of children: Not on file   Years of education: Not on file   Highest education level: Not on file  Occupational History   Not on file  Tobacco Use   Smoking status: Former    Packs/day: 1.00    Years: 42.00    Pack years: 42.00    Types: Cigarettes    Quit date: 12/29/2011    Years since quitting: 9.3   Smokeless tobacco: Former   Tobacco comments:    Quit 12/2011  Vaping Use   Vaping Use: Never used  Substance and Sexual Activity   Alcohol use: Yes    Comment: rarely   Drug use: No   Sexual activity: Not Currently  Other Topics Concern   Not on file  Social History Narrative   Not on file   Social Determinants of Health   Financial Resource Strain: Low Risk    Difficulty of Paying  Living Expenses: Not hard at all  Food Insecurity: No Food Insecurity   Worried About [place] in the Last Year: Never true   Ran Out of Food in the Last Year: Never true  Transportation Needs: No Transportation Needs   Lack of Transportation (Medical): No   Lack of Transportation (Non-Medical): No  Physical Activity: Insufficiently Active   Days of Exercise per Week: 7 days   Minutes of Exercise per Session: 20 min  Stress: Stress Concern Present   Feeling of Stress : To some extent  Social Connections: Socially Isolated   Frequency of Communication with Friends and Family: More than three times a week   Frequency of Social Gatherings with Friends and Family: More than three times a week   Attends Religious Services: Never   Marine scientist or Organizations: No   Attends Programme researcher, broadcasting/film/video: Never   Marital Status: Never married  Human resources officer Violence: Not At Risk   Fear of Current or Ex-Partner: No   Emotionally Abused: No   Physically Abused: No   Sexually Abused: No     Allergies  Allergen Reactions   Paregoric Hives    REACTION: HIVES   Penicillins Rash    REACTION: RASH   Procaine Hcl Other (See Comments)     Outpatient Medications Prior to Visit  Medication Sig Dispense Refill   albuterol (VENTOLIN HFA) 108 (90 Base) MCG/ACT inhaler Inhale 2 puffs into the lungs every 6 (six) hours as needed for wheezing or shortness of breath. 18 g 5   amLODipine (NORVASC) 10 MG tablet TAKE 1 TABLET(10 MG) BY MOUTH DAILY 90 tablet 1   atorvastatin (LIPITOR) 20 MG tablet TAKE 1 TABLET(20 MG) BY MOUTH DAILY 90 tablet 1   celecoxib (CELEBREX) 200 MG capsule Take 1 capsule (200 mg total) by mouth 2 (two) times daily as needed for moderate pain. 180 capsule 3   DULoxetine (CYMBALTA) 60 MG capsule Take 2 capsules (120 mg total) by mouth daily. 180 capsule 1   Fluticasone-Umeclidin-Vilant (TRELEGY ELLIPTA) 100-62.5-25 MCG/ACT AEPB INHALE 1 PUFF INTO THE LUNGS DAILY 60 each 0   hydrochlorothiazide (HYDRODIURIL) 25 MG tablet Take 1 tablet (25 mg total) by mouth daily. 90 tablet 3   HYDROcodone-acetaminophen (NORCO) 10-325 MG tablet Take 1 tablet by mouth every 8 (eight) hours as needed. 30 tablet 0   losartan (COZAAR) 50 MG tablet Take 1 tablet (50 mg total) by mouth daily. 30 tablet 1   pregabalin (LYRICA) 75 MG capsule TAKE 1 CAPSULE(75 MG) BY MOUTH TWICE DAILY 180 capsule 2   traMADol (ULTRAM) 50 MG tablet TAKE 1 TABLET(50 MG) BY MOUTH EVERY 6 HOURS AS NEEDED 90 tablet 0   Vitamin D, Ergocalciferol, (DRISDOL) 1.25 MG (50000 UNIT) CAPS capsule Take 1 capsule (50,000 Units total) by mouth every 7 (seven) days. 12 capsule 1   No facility-administered medications prior to visit.       Objective:   Physical Exam:  General appearance: 65 y.o., female, NAD,  conversant  Eyes: anicteric sclerae; PERRL, tracking appropriately HENT: NCAT; MMM Neck: Trachea midline; no lymphadenopathy, no JVD Lungs: CTAB, no crackles, no wheeze, with normal respiratory effort CV: RRR, no murmur  Abdomen: Soft, non-tender; non-distended, BS present  Extremities: No peripheral edema, warm Skin: Normal turgor and texture; no rash Psych: Appropriate affect Neuro: Alert and oriented to person and place, no focal deficit     Vitals:   05/11/21 1429  BP: 138/82  Pulse: 73  Temp: 97.9 F (36.6 C)  TempSrc: Oral  SpO2: 92%  Weight: 260 lb (117.9 kg)  Height: 5\' 9"  (1.753 m)   92% on RA BMI Readings from Last 3 Encounters:  05/11/21 38.40 kg/m  04/13/21 37.66 kg/m  03/19/21 38.30 kg/m   Wt Readings from Last 3 Encounters:  05/11/21 260 lb (117.9 kg)  04/13/21 255 lb (115.7 kg)  03/19/21 255 lb 9.6 oz (115.9 kg)     CBC    Component Value Date/Time   WBC 8.9 07/03/2015 1520   RBC 4.89 07/03/2015 1520   HGB 13.8 07/03/2015 1520   HCT 42.2 07/03/2015 1520   PLT 262.0 07/03/2015 1520   MCV 86.4 07/03/2015 1520   MCHC 32.7 07/03/2015 1520   RDW 13.6 07/03/2015 1520   LYMPHSABS 3.1 07/03/2015 1520   MONOABS 0.6 07/03/2015 1520   EOSABS 0.1 07/03/2015 1520   BASOSABS 0.0 07/03/2015 1520      Chest Imaging: CT Chest 07/02/20 reviewed by me remarkable for multiple stable bilateral ggo/subsolid nodules, emphysema, mild bronchial wall thickening  Pulmonary Functions Testing Results: No flowsheet data found.    Echocardiogram:   TTE 2015: G1DD      Assessment & Plan:   # DOE # Emphysema  # Bronchial wall thickening May be multifactorial with COPD, sleep disordered breathing, deconditioning following covid-19 infection.  # Multiple pulmonary nodules: stable subsolid, followed in lung cancer screening program.  Plan: - continue low dose trelegy - BNP - PFTs now - If unremarkable ventilatory defect or impairment in diffusing  capacity then consider TTE/stress testing - in setting of excessive daytime sleepiness, snoring will order home sleep study with high probability of OSA - continues in lung cancer screening program   RTC 3 months   Maryjane Hurter, MD [facility] 05/11/2021 2:53 PM

## 2021-05-11 ENCOUNTER — Other Ambulatory Visit: Payer: Self-pay

## 2021-05-11 ENCOUNTER — Encounter: Payer: Self-pay | Admitting: Student

## 2021-05-11 ENCOUNTER — Ambulatory Visit: Payer: Medicare PPO | Admitting: Student

## 2021-05-11 VITALS — BP 138/82 | HR 73 | Temp 97.9°F | Ht 69.0 in | Wt 260.0 lb

## 2021-05-11 DIAGNOSIS — R0609 Other forms of dyspnea: Secondary | ICD-10-CM | POA: Diagnosis not present

## 2021-05-11 DIAGNOSIS — G4733 Obstructive sleep apnea (adult) (pediatric): Secondary | ICD-10-CM

## 2021-05-11 NOTE — Patient Instructions (Signed)
-   keep using trelegy - you will be called to schedule PFTs, home sleep study - labs today - see you in 3 months!

## 2021-05-12 LAB — BRAIN NATRIURETIC PEPTIDE: Pro B Natriuretic peptide (BNP): 14 pg/mL (ref 0.0–100.0)

## 2021-05-15 ENCOUNTER — Ambulatory Visit (INDEPENDENT_AMBULATORY_CARE_PROVIDER_SITE_OTHER): Payer: Medicare PPO | Admitting: *Deleted

## 2021-05-15 ENCOUNTER — Other Ambulatory Visit: Payer: Self-pay | Admitting: *Deleted

## 2021-05-15 DIAGNOSIS — E538 Deficiency of other specified B group vitamins: Secondary | ICD-10-CM

## 2021-05-15 MED ORDER — CYANOCOBALAMIN 1000 MCG/ML IJ SOLN
1000.0000 ug | Freq: Once | INTRAMUSCULAR | Status: AC
  Administered 2021-05-15: 1000 ug via INTRAMUSCULAR

## 2021-05-15 MED ORDER — "LUER LOCK SAFETY SYRINGES 25G X 5/8"" 3 ML MISC": 1 refills | Status: DC

## 2021-05-15 MED ORDER — CYANOCOBALAMIN 1000 MCG/ML IJ SOLN: 1000.0000 ug | INTRAMUSCULAR | 1 refills | Status: DC

## 2021-05-15 NOTE — Progress Notes (Signed)
Patient here for last weekly b12 injection.    Patient stated that we are to train her on giving her injection so she can do at home.   Patient was trained on giving her own injection.  Injection was given in abdomen and tolerated well.  B12 and syringes sent into pharmacy.

## 2021-05-18 DIAGNOSIS — M9903 Segmental and somatic dysfunction of lumbar region: Secondary | ICD-10-CM | POA: Diagnosis not present

## 2021-05-18 DIAGNOSIS — M5431 Sciatica, right side: Secondary | ICD-10-CM | POA: Diagnosis not present

## 2021-05-18 DIAGNOSIS — M9902 Segmental and somatic dysfunction of thoracic region: Secondary | ICD-10-CM | POA: Diagnosis not present

## 2021-05-18 DIAGNOSIS — M9905 Segmental and somatic dysfunction of pelvic region: Secondary | ICD-10-CM | POA: Diagnosis not present

## 2021-05-18 DIAGNOSIS — M5134 Other intervertebral disc degeneration, thoracic region: Secondary | ICD-10-CM | POA: Diagnosis not present

## 2021-05-18 DIAGNOSIS — M5136 Other intervertebral disc degeneration, lumbar region: Secondary | ICD-10-CM | POA: Diagnosis not present

## 2021-05-26 ENCOUNTER — Other Ambulatory Visit: Payer: Self-pay | Admitting: Family Medicine

## 2021-05-26 DIAGNOSIS — J439 Emphysema, unspecified: Secondary | ICD-10-CM

## 2021-06-01 DIAGNOSIS — M9903 Segmental and somatic dysfunction of lumbar region: Secondary | ICD-10-CM | POA: Diagnosis not present

## 2021-06-01 DIAGNOSIS — M9905 Segmental and somatic dysfunction of pelvic region: Secondary | ICD-10-CM | POA: Diagnosis not present

## 2021-06-01 DIAGNOSIS — M5136 Other intervertebral disc degeneration, lumbar region: Secondary | ICD-10-CM | POA: Diagnosis not present

## 2021-06-01 DIAGNOSIS — M9902 Segmental and somatic dysfunction of thoracic region: Secondary | ICD-10-CM | POA: Diagnosis not present

## 2021-06-01 DIAGNOSIS — M5431 Sciatica, right side: Secondary | ICD-10-CM | POA: Diagnosis not present

## 2021-06-01 DIAGNOSIS — M5134 Other intervertebral disc degeneration, thoracic region: Secondary | ICD-10-CM | POA: Diagnosis not present

## 2021-06-06 ENCOUNTER — Other Ambulatory Visit: Payer: Self-pay | Admitting: Family Medicine

## 2021-06-06 DIAGNOSIS — I10 Essential (primary) hypertension: Secondary | ICD-10-CM

## 2021-06-09 ENCOUNTER — Other Ambulatory Visit: Payer: Self-pay | Admitting: Family Medicine

## 2021-06-09 DIAGNOSIS — I10 Essential (primary) hypertension: Secondary | ICD-10-CM

## 2021-07-01 DIAGNOSIS — M9902 Segmental and somatic dysfunction of thoracic region: Secondary | ICD-10-CM | POA: Diagnosis not present

## 2021-07-01 DIAGNOSIS — M5136 Other intervertebral disc degeneration, lumbar region: Secondary | ICD-10-CM | POA: Diagnosis not present

## 2021-07-01 DIAGNOSIS — M5431 Sciatica, right side: Secondary | ICD-10-CM | POA: Diagnosis not present

## 2021-07-01 DIAGNOSIS — M5134 Other intervertebral disc degeneration, thoracic region: Secondary | ICD-10-CM | POA: Diagnosis not present

## 2021-07-01 DIAGNOSIS — M9905 Segmental and somatic dysfunction of pelvic region: Secondary | ICD-10-CM | POA: Diagnosis not present

## 2021-07-01 DIAGNOSIS — M9903 Segmental and somatic dysfunction of lumbar region: Secondary | ICD-10-CM | POA: Diagnosis not present

## 2021-07-19 ENCOUNTER — Encounter: Payer: Self-pay | Admitting: Family Medicine

## 2021-07-22 DIAGNOSIS — M5431 Sciatica, right side: Secondary | ICD-10-CM | POA: Diagnosis not present

## 2021-07-22 DIAGNOSIS — M9903 Segmental and somatic dysfunction of lumbar region: Secondary | ICD-10-CM | POA: Diagnosis not present

## 2021-07-22 DIAGNOSIS — M9905 Segmental and somatic dysfunction of pelvic region: Secondary | ICD-10-CM | POA: Diagnosis not present

## 2021-07-22 DIAGNOSIS — M9902 Segmental and somatic dysfunction of thoracic region: Secondary | ICD-10-CM | POA: Diagnosis not present

## 2021-07-22 DIAGNOSIS — M5136 Other intervertebral disc degeneration, lumbar region: Secondary | ICD-10-CM | POA: Diagnosis not present

## 2021-07-22 DIAGNOSIS — M5134 Other intervertebral disc degeneration, thoracic region: Secondary | ICD-10-CM | POA: Diagnosis not present

## 2021-07-23 ENCOUNTER — Other Ambulatory Visit: Payer: Self-pay | Admitting: Acute Care

## 2021-07-23 DIAGNOSIS — Z87891 Personal history of nicotine dependence: Secondary | ICD-10-CM

## 2021-07-29 ENCOUNTER — Encounter: Payer: Self-pay | Admitting: Student

## 2021-07-29 ENCOUNTER — Ambulatory Visit: Payer: Medicare PPO | Admitting: Student

## 2021-07-29 ENCOUNTER — Ambulatory Visit (INDEPENDENT_AMBULATORY_CARE_PROVIDER_SITE_OTHER): Payer: Medicare PPO | Admitting: Student

## 2021-07-29 ENCOUNTER — Ambulatory Visit: Payer: Medicare PPO

## 2021-07-29 ENCOUNTER — Other Ambulatory Visit: Payer: Self-pay

## 2021-07-29 VITALS — BP 130/72 | HR 81 | Temp 97.5°F | Ht 67.5 in | Wt 259.0 lb

## 2021-07-29 DIAGNOSIS — Z23 Encounter for immunization: Secondary | ICD-10-CM

## 2021-07-29 DIAGNOSIS — G4733 Obstructive sleep apnea (adult) (pediatric): Secondary | ICD-10-CM

## 2021-07-29 DIAGNOSIS — R0609 Other forms of dyspnea: Secondary | ICD-10-CM | POA: Diagnosis not present

## 2021-07-29 DIAGNOSIS — J439 Emphysema, unspecified: Secondary | ICD-10-CM | POA: Diagnosis not present

## 2021-07-29 DIAGNOSIS — J449 Chronic obstructive pulmonary disease, unspecified: Secondary | ICD-10-CM

## 2021-07-29 LAB — PULMONARY FUNCTION TEST
DL/VA % pred: 107 %
DL/VA: 4.36 ml/min/mmHg/L
DLCO cor % pred: 88 %
DLCO cor: 19.76 ml/min/mmHg
DLCO unc % pred: 88 %
DLCO unc: 19.76 ml/min/mmHg
FEF 25-75 Post: 1.85 L/sec
FEF 25-75 Pre: 1.63 L/sec
FEF2575-%Change-Post: 13 %
FEF2575-%Pred-Post: 77 %
FEF2575-%Pred-Pre: 68 %
FEV1-%Change-Post: 2 %
FEV1-%Pred-Post: 75 %
FEV1-%Pred-Pre: 73 %
FEV1-Post: 2.1 L
FEV1-Pre: 2.05 L
FEV1FVC-%Change-Post: 1 %
FEV1FVC-%Pred-Pre: 98 %
FEV6-%Change-Post: 1 %
FEV6-%Pred-Post: 78 %
FEV6-%Pred-Pre: 77 %
FEV6-Post: 2.73 L
FEV6-Pre: 2.7 L
FEV6FVC-%Pred-Post: 103 %
FEV6FVC-%Pred-Pre: 103 %
FVC-%Change-Post: 1 %
FVC-%Pred-Post: 75 %
FVC-%Pred-Pre: 74 %
FVC-Post: 2.73 L
FVC-Pre: 2.7 L
Post FEV1/FVC ratio: 77 %
Post FEV6/FVC ratio: 100 %
Pre FEV1/FVC ratio: 76 %
Pre FEV6/FVC Ratio: 100 %
RV % pred: 100 %
RV: 2.25 L
TLC % pred: 96 %
TLC: 5.37 L

## 2021-07-29 MED ORDER — TRELEGY ELLIPTA 100-62.5-25 MCG/ACT IN AEPB: 1.0000 | INHALATION_SPRAY | Freq: Every day | RESPIRATORY_TRACT | 11 refills | Status: DC

## 2021-07-29 NOTE — Progress Notes (Signed)
PFT done today. 

## 2021-07-29 NOTE — Patient Instructions (Signed)
-   continue trelegy 1 puff daily rinse mouth after use - see you in 8 weeks, call or send message if you have questions about your sleep study results

## 2021-07-29 NOTE — Progress Notes (Signed)
Synopsis: Referred for chronic bronchitis by [name], *  Subjective:   PATIENT ID: Veronica Solomon GENDER: female DOB: 09-10-1955, MRN: [id number]  Chief Complaint  Patient presents with   Follow-up    PFT  performed today. Pt states she is about the same since last visit. States she still becomes SOB that can happen at any time but is worse with exertion and has an occasional cough.   [name] with history of smoking who is referred for chronic bronchitis who is enrolled in lung cancer screening. Has been told that she has COPD - if she has done PFTs it's been many years. Had covid twice - in July/August. Was not hospitalized but did get course of paxlovid and steroids. This is only course of steroids she's ever received and it's hard for her to recall effect that it had on her breathing.   She has DOE over last several years. Can walk on level ground half mile to a mile without issue but with incline or increased speed her exercise tolerance has worsened. She has no orthopnea. Weight has increased 30 lb over last couple of years. She has never had a blood clot. Never had asthma as a kid. Does get some exertional chest tightness, gets better when she stops.   She is taking trelegy over last 3-4 months, rinses mouth after use. She thinks she's had only minor improvement. Only minor improvement with albuterol as well.  No family history of cancer. Did have family with emphysema, COPD  Smoked for 42y. Pack per day. Quit in 2013. 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 Has lived in [place], moved to [place] in 1980. Apart from living in [place] but has had no exposure to dusts/solvents/particulates without a mask.   Interval HPI; Last seen by me 05/11/21 with plan for PFTs, sleep study. Feeling about the same from DOE standpoint. No significant cough. Rinsing mouth after using trelegy.    Otherwise pertinent review of systems is negative.  Past  Medical History:  Diagnosis Date   Adenomatous colon polyp    Allergy    C. difficile colitis    Diverticulosis    Hyperlipidemia    Hypertension    IBS (irritable bowel syndrome)    Nasal congestion 06/14/1998   Neuromuscular disorder ([place])      Family History  Problem Relation Age of Onset   Hypertension Mother    Depression Mother    Heart attack Mother    Uterine cancer Mother    Diabetes Maternal Grandmother    Hypertension Paternal Grandmother      Past Surgical History:  Procedure Laterality Date   ABDOMINAL HYSTERECTOMY  2002   fibroids    Social History   Socioeconomic History   Marital status: Single    Spouse name: Not on file   Number of children: Not on file   Years of education: Not on file   Highest education level: Not on file  Occupational History   Not on file  Tobacco Use   Smoking status: Former    Packs/day: 1.00    Years: 42.00    Pack years: 42.00    Types: Cigarettes    Quit date: 12/29/2011    Years since quitting: 9.5   Smokeless tobacco: Former   Tobacco comments:    Quit 12/2011  Vaping Use   Vaping Use: Never used  Substance and Sexual Activity   Alcohol use: Yes  Comment: rarely   Drug use: No   Sexual activity: Not Currently  Other Topics Concern   Not on file  Social History Narrative   Not on file   Social Determinants of Health   Financial Resource Strain: Low Risk    Difficulty of Paying Living Expenses: Not hard at all  Food Insecurity: No Food Insecurity   Worried About Running Out of Food in the Last Year: Never true   [place] in the Last Year: Never true  Transportation Needs: No Transportation Needs   Lack of Transportation (Medical): No   Lack of Transportation (Non-Medical): No  Physical Activity: Insufficiently Active   Days of Exercise per Week: 7 days   Minutes of Exercise per Session: 20 min  Stress: Stress Concern Present   Feeling of Stress : To some extent  Social Connections:  Socially Isolated   Frequency of Communication with Friends and Family: More than three times a week   Frequency of Social Gatherings with Friends and Family: More than three times a week   Attends Religious Services: Never   Marine scientist or Organizations: No   Attends Music therapist: Never   Marital Status: Never married  Human resources officer Violence: Not At Risk   Fear of Current or Ex-Partner: No   Emotionally Abused: No   Physically Abused: No   Sexually Abused: No     Allergies  Allergen Reactions   Paregoric Hives    REACTION: HIVES   Penicillins Rash    REACTION: RASH   Procaine Hcl Other (See Comments)     Outpatient Medications Prior to Visit  Medication Sig Dispense Refill   albuterol (VENTOLIN HFA) 108 (90 Base) MCG/ACT inhaler Inhale 2 puffs into the lungs every 6 (six) hours as needed for wheezing or shortness of breath. 18 g 5   amLODipine (NORVASC) 10 MG tablet TAKE 1 TABLET(10 MG) BY MOUTH DAILY 90 tablet 1   atorvastatin (LIPITOR) 20 MG tablet TAKE 1 TABLET(20 MG) BY MOUTH DAILY 90 tablet 1   celecoxib (CELEBREX) 200 MG capsule Take 1 capsule (200 mg total) by mouth 2 (two) times daily as needed for moderate pain. 180 capsule 3   cyanocobalamin (,VITAMIN B-12,) 1000 MCG/ML injection Inject 1 mL (1,000 mcg total) into the muscle every 30 (thirty) days. 3 mL 1   DULoxetine (CYMBALTA) 60 MG capsule Take 2 capsules (120 mg total) by mouth daily. 180 capsule 1   Fluticasone-Umeclidin-Vilant (TRELEGY ELLIPTA) 100-62.5-25 MCG/ACT AEPB INHALE 1 PUFF INTO THE LUNGS DAILY 60 each 2   hydrochlorothiazide (HYDRODIURIL) 25 MG tablet Take 1 tablet (25 mg total) by mouth daily. 90 tablet 3   HYDROcodone-acetaminophen (NORCO) 10-325 MG tablet Take 1 tablet by mouth every 8 (eight) hours as needed. 30 tablet 0   losartan (COZAAR) 50 MG tablet TAKE 1 TABLET(50 MG) BY MOUTH DAILY 90 tablet 0   pregabalin (LYRICA) 75 MG capsule TAKE 1 CAPSULE(75 MG) BY MOUTH  TWICE DAILY 180 capsule 2   SYRINGE-NEEDLE, DISP, 3 ML (LUER LOCK SAFETY SYRINGES) 25G X 5/8" 3 ML MISC Use as directed once a month to give b12 injection 10 each 1   traMADol (ULTRAM) 50 MG tablet TAKE 1 TABLET(50 MG) BY MOUTH EVERY 6 HOURS AS NEEDED 90 tablet 0   Vitamin D, Ergocalciferol, (DRISDOL) 1.25 MG (50000 UNIT) CAPS capsule Take 1 capsule (50,000 Units total) by mouth every 7 (seven) days. 12 capsule 1   No facility-administered medications prior  to visit.       Objective:   Physical Exam:  General appearance: 66 y.o., female, NAD, conversant  Eyes: anicteric sclerae; PERRL, tracking appropriately HENT: NCAT; MMM Neck: Trachea midline; no lymphadenopathy, no JVD Lungs: CTAB, no crackles, no wheeze, with normal respiratory effort CV: RRR, +systolic murmur  Abdomen: Soft, non-tender; non-distended, BS present  Extremities: No peripheral edema, warm Skin: Normal turgor and texture; no rash Psych: Appropriate affect Neuro: Alert and oriented to person and place, no focal deficit     Vitals:   07/29/21 1407  BP: 130/72  Pulse: 81  Temp: (!) 97.5 F (36.4 C)  TempSrc: Oral  SpO2: 93%  Weight: 259 lb (117.5 kg)  Height: 5' 7.5" (1.715 m)    93% on RA BMI Readings from Last 3 Encounters:  07/29/21 39.97 kg/m  05/11/21 38.40 kg/m  04/13/21 37.66 kg/m   Wt Readings from Last 3 Encounters:  07/29/21 259 lb (117.5 kg)  05/11/21 260 lb (117.9 kg)  04/13/21 255 lb (115.7 kg)     CBC    Component Value Date/Time   WBC 8.9 07/03/2015 1520   RBC 4.89 07/03/2015 1520   HGB 13.8 07/03/2015 1520   HCT 42.2 07/03/2015 1520   PLT 262.0 07/03/2015 1520   MCV 86.4 07/03/2015 1520   MCHC 32.7 07/03/2015 1520   RDW 13.6 07/03/2015 1520   LYMPHSABS 3.1 07/03/2015 1520   MONOABS 0.6 07/03/2015 1520   EOSABS 0.1 07/03/2015 1520   BASOSABS 0.0 07/03/2015 1520      Chest Imaging: CT Chest 07/02/20 reviewed by me remarkable for multiple stable bilateral  ggo/subsolid nodules, emphysema, mild bronchial wall thickening  Pulmonary Functions Testing Results: PFT Results Latest Ref Rng & Units 07/29/2021  FVC-Pre L 2.70  FVC-Predicted Pre % 74  FVC-Post L 2.73  FVC-Predicted Post % 75  Pre FEV1/FVC % % 76  Post FEV1/FCV % % 77  FEV1-Pre L 2.05  FEV1-Predicted Pre % 73  FEV1-Post L 2.10  DLCO uncorrected ml/min/mmHg 19.76  DLCO UNC% % 88  DLCO corrected ml/min/mmHg 19.76  DLCO COR %Predicted % 88  DLVA Predicted % 107  TLC L 5.37  TLC % Predicted % 96  RV % Predicted % 100   Reviewed by me with mild obstruction  Echocardiogram:   TTE 2015: G1DD      Assessment & Plan:   # DOE # Emphysema  # Bronchial wall thickening # COPD Gold functional group B May be multifactorial with COPD only recently started on trelegy, undiagnosed sleep disordered breathing, deconditioning following covid-19 infection.  # Multiple pulmonary nodules: stable subsolid, followed in lung cancer screening program.  Plan: - continue low dose trelegy - If no significant component of untreated sleep disordered breathing then consideration of TTE/stress testing - continues in lung cancer screening program - prevnar 20 today   RTC 8 weeks to review sleep study   Maryjane Hurter, MD [facility] 07/29/2021 2:20 PM

## 2021-07-30 ENCOUNTER — Ambulatory Visit (INDEPENDENT_AMBULATORY_CARE_PROVIDER_SITE_OTHER)
Admission: RE | Admit: 2021-07-30 | Discharge: 2021-07-30 | Disposition: A | Discharge status: Home / Self Care | Payer: Medicare PPO | Source: Ambulatory Visit | Attending: Family Medicine | Admitting: Family Medicine

## 2021-07-30 ENCOUNTER — Other Ambulatory Visit: Payer: Self-pay

## 2021-07-30 DIAGNOSIS — Z87891 Personal history of nicotine dependence: Secondary | ICD-10-CM

## 2021-08-03 ENCOUNTER — Ambulatory Visit: Payer: Medicare PPO | Admitting: Family Medicine

## 2021-08-03 ENCOUNTER — Encounter: Payer: Self-pay | Admitting: Family Medicine

## 2021-08-03 ENCOUNTER — Other Ambulatory Visit: Payer: Self-pay | Admitting: Acute Care

## 2021-08-03 ENCOUNTER — Ambulatory Visit: Payer: Medicare PPO

## 2021-08-03 VITALS — BP 118/60 | HR 83 | Temp 97.8°F | Resp 18 | Ht 67.5 in | Wt 256.6 lb

## 2021-08-03 DIAGNOSIS — E785 Hyperlipidemia, unspecified: Secondary | ICD-10-CM | POA: Diagnosis not present

## 2021-08-03 DIAGNOSIS — E559 Vitamin D deficiency, unspecified: Secondary | ICD-10-CM | POA: Diagnosis not present

## 2021-08-03 DIAGNOSIS — R011 Cardiac murmur, unspecified: Secondary | ICD-10-CM | POA: Diagnosis not present

## 2021-08-03 DIAGNOSIS — I10 Essential (primary) hypertension: Secondary | ICD-10-CM | POA: Diagnosis not present

## 2021-08-03 DIAGNOSIS — Z79899 Other long term (current) drug therapy: Secondary | ICD-10-CM | POA: Diagnosis not present

## 2021-08-03 DIAGNOSIS — Z87891 Personal history of nicotine dependence: Secondary | ICD-10-CM

## 2021-08-03 NOTE — Assessment & Plan Note (Signed)
Tolerating statin, encouraged heart healthy diet, avoid trans fats, minimize simple carbs and saturated fats. Increase exercise as tolerated 

## 2021-08-03 NOTE — Patient Instructions (Signed)
Cholesterol Content in Foods ?Cholesterol is a waxy, fat-like substance that helps to carry fat in the blood. The body needs cholesterol in small amounts, but too much cholesterol can cause damage to the arteries and heart. ?What foods have cholesterol? ?Cholesterol is found in animal-based foods, such as meat, seafood, and dairy. Generally, low-fat dairy and lean meats have less cholesterol than full-fat dairy and fatty meats. The milligrams of cholesterol per serving (mg per serving) of common cholesterol-containing foods are listed below. ?Meats and other proteins ?Egg -- one large whole egg has 186 mg. ?Veal shank -- 4 oz (113 g) has 141 mg. ?Lean ground turkey (93% lean) -- 4 oz (113 g) has 118 mg. ?Fat-trimmed lamb loin -- 4 oz (113 g) has 106 mg. ?Lean ground beef (90% lean) -- 4 oz (113 g) has 100 mg. ?Lobster -- 3.5 oz (99 g) has 90 mg. ?Pork loin chops -- 4 oz (113 g) has 86 mg. ?Canned salmon -- 3.5 oz (99 g) has 83 mg. ?Fat-trimmed beef top loin -- 4 oz (113 g) has 78 mg. ?Frankfurter -- 1 frank (3.5 oz or 99 g) has 77 mg. ?Crab -- 3.5 oz (99 g) has 71 mg. ?Roasted chicken without skin, white meat -- 4 oz (113 g) has 66 mg. ?Light bologna -- 2 oz (57 g) has 45 mg. ?Deli-cut turkey -- 2 oz (57 g) has 31 mg. ?Canned tuna -- 3.5 oz (99 g) has 31 mg. ?Bacon -- 1 oz (28 g) has 29 mg. ?Oysters and mussels (raw) -- 3.5 oz (99 g) has 25 mg. ?Mackerel -- 1 oz (28 g) has 22 mg. ?Trout -- 1 oz (28 g) has 20 mg. ?Pork sausage -- 1 link (1 oz or 28 g) has 17 mg. ?Salmon -- 1 oz (28 g) has 16 mg. ?Tilapia -- 1 oz (28 g) has 14 mg. ?Dairy ?Soft-serve ice cream -- ? cup (4 oz or 86 g) has 103 mg. ?Whole-milk yogurt -- 1 cup (8 oz or 245 g) has 29 mg. ?Cheddar cheese -- 1 oz (28 g) has 28 mg. ?American cheese -- 1 oz (28 g) has 28 mg. ?Whole milk -- 1 cup (8 oz or 250 mL) has 23 mg. ?2% milk -- 1 cup (8 oz or 250 mL) has 18 mg. ?Cream cheese -- 1 tablespoon (Tbsp) (14.5 g) has 15 mg. ?Cottage cheese -- ? cup (4 oz or 113  g) has 14 mg. ?Low-fat (1%) milk -- 1 cup (8 oz or 250 mL) has 10 mg. ?Sour cream -- 1 Tbsp (12 g) has 8.5 mg. ?Low-fat yogurt -- 1 cup (8 oz or 245 g) has 8 mg. ?Nonfat Greek yogurt -- 1 cup (8 oz or 228 g) has 7 mg. ?Half-and-half cream -- 1 Tbsp (15 mL) has 5 mg. ?Fats and oils ?Cod liver oil -- 1 tablespoon (Tbsp) (13.6 g) has 82 mg. ?Butter -- 1 Tbsp (14 g) has 15 mg. ?Lard -- 1 Tbsp (12.8 g) has 14 mg. ?Bacon grease -- 1 Tbsp (12.9 g) has 14 mg. ?Mayonnaise -- 1 Tbsp (13.8 g) has 5-10 mg. ?Margarine -- 1 Tbsp (14 g) has 3-10 mg. ?The items listed above may not be a complete list of foods with cholesterol. Exact amounts of cholesterol in these foods may vary depending on specific ingredients and brands. Contact a dietitian for more information. ?What foods do not have cholesterol? ?Most plant-based foods do not have cholesterol unless you combine them with a food that has cholesterol.   Foods without cholesterol include: ?Grains and cereals. ?Vegetables. ?Fruits. ?Vegetable oils, such as olive, canola, and sunflower oil. ?Legumes, such as peas, beans, and lentils. ?Nuts and seeds. ?Egg whites. ?The items listed above may not be a complete list of foods that do not have cholesterol. Contact a dietitian for more information. ?Summary ?The body needs cholesterol in small amounts, but too much cholesterol can cause damage to the arteries and heart. ?Cholesterol is found in animal-based foods, such as meat, seafood, and dairy. Generally, low-fat dairy and lean meats have less cholesterol than full-fat dairy and fatty meats. ?This information is not intended to replace advice given to you by your health care provider. Make sure you discuss any questions you have with your health care provider. ?Document Revised: 10/10/2020 Document Reviewed: 10/10/2020 ?Elsevier Patient Education ? 2022 Elsevier Inc. ? ?

## 2021-08-03 NOTE — Assessment & Plan Note (Signed)
D/c hctz con't losartan  con't norvasc

## 2021-08-03 NOTE — Assessment & Plan Note (Signed)
?   Worse than previously Pt will call ins to see where the cheapest place for her to get and echo is

## 2021-08-03 NOTE — Progress Notes (Addendum)
Subjective:   By signing my name below, I, Zite Okoli, attest that this documentation has been prepared under the direction and in the presence of [name], DO. 08/03/2021    Patient ID: Veronica Solomon, female    DOB: 1955-09-05, 66 y.o.   MRN: [id number]  Chief Complaint  Patient presents with   Hyperlipidemia   Hypertension   Follow-up    HPI Patient is in today for an office visit and f/u.  Her blood pressure is stable at today's visit. She will like to reduce the amount of medications she is taking to manage hypertension. BP Readings from Last 3 Encounters:  08/03/21 118/60  07/29/21 130/72  05/11/21 138/82    She is seeing pulmonology to manage her breathing problems.  She has 2 Covid-19 vaccines at this time and will get the booster vaccine. She has received the pneumonia and shingles vaccines.   Past Medical History:  Diagnosis Date   Adenomatous colon polyp    Allergy    C. difficile colitis    Diverticulosis    Hyperlipidemia    Hypertension    IBS (irritable bowel syndrome)    Nasal congestion 06/14/1998   Neuromuscular disorder ([place])     Past Surgical History:  Procedure Laterality Date   ABDOMINAL HYSTERECTOMY  2002   fibroids    Family History  Problem Relation Age of Onset   Hypertension Mother    Depression Mother    Heart attack Mother    Uterine cancer Mother    Diabetes Maternal Grandmother    Hypertension Paternal Grandmother     Social History   Socioeconomic History   Marital status: Single    Spouse name: Not on file   Number of children: Not on file   Years of education: Not on file   Highest education level: Not on file  Occupational History   Not on file  Tobacco Use   Smoking status: Former    Packs/day: 1.00    Years: 42.00    Pack years: 42.00    Types: Cigarettes    Quit date: 12/29/2011    Years since quitting: 9.6   Smokeless tobacco: Former   Tobacco comments:    Quit 12/2011  Vaping Use    Vaping Use: Never used  Substance and Sexual Activity   Alcohol use: Yes    Comment: rarely   Drug use: No   Sexual activity: Not Currently  Other Topics Concern   Not on file  Social History Narrative   Not on file   Social Determinants of Health   Financial Resource Strain: Low Risk    Difficulty of Paying Living Expenses: Not hard at all  Food Insecurity: No Food Insecurity   Worried About Charity fundraiser in the Last Year: Never true   [place] in the Last Year: Never true  Transportation Needs: No Transportation Needs   Lack of Transportation (Medical): No   Lack of Transportation (Non-Medical): No  Physical Activity: Insufficiently Active   Days of Exercise per Week: 7 days   Minutes of Exercise per Session: 20 min  Stress: Stress Concern Present   Feeling of Stress : To some extent  Social Connections: Socially Isolated   Frequency of Communication with Friends and Family: More than three times a week   Frequency of Social Gatherings with Friends and Family: More than three times a week   Attends Religious Services: Never   Active Member of  Clubs or Organizations: No   Attends Archivist Meetings: Never   Marital Status: Never married  Human resources officer Violence: Not At Risk   Fear of Current or Ex-Partner: No   Emotionally Abused: No   Physically Abused: No   Sexually Abused: No    Outpatient Medications Prior to Visit  Medication Sig Dispense Refill   albuterol (VENTOLIN HFA) 108 (90 Base) MCG/ACT inhaler Inhale 2 puffs into the lungs every 6 (six) hours as needed for wheezing or shortness of breath. 18 g 5   amLODipine (NORVASC) 10 MG tablet TAKE 1 TABLET(10 MG) BY MOUTH DAILY 90 tablet 1   atorvastatin (LIPITOR) 20 MG tablet TAKE 1 TABLET(20 MG) BY MOUTH DAILY 90 tablet 1   celecoxib (CELEBREX) 200 MG capsule Take 1 capsule (200 mg total) by mouth 2 (two) times daily as needed for moderate pain. 180 capsule 3   cyanocobalamin (,VITAMIN  B-12,) 1000 MCG/ML injection Inject 1 mL (1,000 mcg total) into the muscle every 30 (thirty) days. 3 mL 1   DULoxetine (CYMBALTA) 60 MG capsule Take 2 capsules (120 mg total) by mouth daily. 180 capsule 1   Fluticasone-Umeclidin-Vilant (TRELEGY ELLIPTA) 100-62.5-25 MCG/ACT AEPB Inhale 1 puff into the lungs daily. 1 each 11   HYDROcodone-acetaminophen (NORCO) 10-325 MG tablet Take 1 tablet by mouth every 8 (eight) hours as needed. 30 tablet 0   losartan (COZAAR) 50 MG tablet TAKE 1 TABLET(50 MG) BY MOUTH DAILY 90 tablet 0   pregabalin (LYRICA) 75 MG capsule TAKE 1 CAPSULE(75 MG) BY MOUTH TWICE DAILY 180 capsule 2   SYRINGE-NEEDLE, DISP, 3 ML (LUER LOCK SAFETY SYRINGES) 25G X 5/8" 3 ML MISC Use as directed once a month to give b12 injection 10 each 1   traMADol (ULTRAM) 50 MG tablet TAKE 1 TABLET(50 MG) BY MOUTH EVERY 6 HOURS AS NEEDED 90 tablet 0   Vitamin D, Ergocalciferol, (DRISDOL) 1.25 MG (50000 UNIT) CAPS capsule Take 1 capsule (50,000 Units total) by mouth every 7 (seven) days. 12 capsule 1   hydrochlorothiazide (HYDRODIURIL) 25 MG tablet Take 1 tablet (25 mg total) by mouth daily. 90 tablet 3   No facility-administered medications prior to visit.    Allergies  Allergen Reactions   Paregoric Hives    REACTION: HIVES   Penicillins Rash    REACTION: RASH   Procaine Hcl Other (See Comments)    Review of Systems  Constitutional:  Negative for fever.  HENT:  Negative for congestion, ear pain, hearing loss, sinus pain and sore throat.   Eyes:  Negative for blurred vision and pain.  Respiratory:  Negative for cough, sputum production, shortness of breath and wheezing.   Cardiovascular:  Negative for chest pain and palpitations.  Gastrointestinal:  Negative for blood in stool, constipation, diarrhea, nausea and vomiting.  Genitourinary:  Negative for dysuria, frequency, hematuria and urgency.  Musculoskeletal:  Negative for back pain, falls and myalgias.  Neurological:  Negative for  dizziness, sensory change, loss of consciousness, weakness and headaches.  Endo/Heme/Allergies:  Negative for environmental allergies. Does not bruise/bleed easily.  Psychiatric/Behavioral:  Negative for depression and suicidal ideas. The patient is not nervous/anxious and does not have insomnia.       Objective:    Physical Exam Constitutional:      General: She is not in acute distress.    Appearance: Normal appearance. She is not ill-appearing.  HENT:     Head: Normocephalic and atraumatic.     Right Ear: External ear normal.  Left Ear: External ear normal.  Eyes:     Extraocular Movements: Extraocular movements intact.     Pupils: Pupils are equal, round, and reactive to light.  Cardiovascular:     Rate and Rhythm: Normal rate and regular rhythm.     Pulses: Normal pulses.     Heart sounds: Murmur heard.  Systolic (2+) murmur is present with a grade of 2/6.    No gallop.  Pulmonary:     Effort: Pulmonary effort is normal. No respiratory distress.     Breath sounds: Normal breath sounds. No wheezing, rhonchi or rales.  Abdominal:     General: Bowel sounds are normal. There is no distension.     Palpations: Abdomen is soft. There is no mass.     Tenderness: There is no abdominal tenderness. There is no guarding or rebound.     Hernia: No hernia is present.  Musculoskeletal:     Cervical back: Normal range of motion and neck supple.  Lymphadenopathy:     Cervical: No cervical adenopathy.  Skin:    General: Skin is warm and dry.  Neurological:     Mental Status: She is alert and oriented to person, place, and time.  Psychiatric:        Behavior: Behavior normal.    BP 118/60 (BP Location: Right Arm, Patient Position: Sitting, Cuff Size: Large)    Pulse 83    Temp 97.8 F (36.6 C) (Oral)    Resp 18    Ht 5' 7.5" (1.715 m)    Wt 256 lb 9.6 oz (116.4 kg)    SpO2 95%    BMI 39.60 kg/m  Wt Readings from Last 3 Encounters:  08/03/21 256 lb 9.6 oz (116.4 kg)  07/29/21 259  lb (117.5 kg)  05/11/21 260 lb (117.9 kg)    Diabetic Foot Exam - Simple   No data filed    Lab Results  Component Value Date   WBC 8.9 07/03/2015   HGB 13.8 07/03/2015   HCT 42.2 07/03/2015   PLT 262.0 07/03/2015   GLUCOSE 100 (H) 03/19/2021   CHOL 158 03/19/2021   TRIG 95.0 03/19/2021   HDL 60.50 03/19/2021   LDLDIRECT 195.7 01/12/2010   LDLCALC 78 03/19/2021   ALT 18 03/19/2021   AST 21 03/19/2021   NA 141 03/19/2021   K 3.9 03/19/2021   CL 98 03/19/2021   CREATININE 0.90 03/19/2021   BUN 21 03/19/2021   CO2 32 03/19/2021   TSH 1.33 03/19/2021   HGBA1C 5.8 12/30/2014   MICROALBUR 4.1 (H) 11/24/2012    Lab Results  Component Value Date   TSH 1.33 03/19/2021   Lab Results  Component Value Date   WBC 8.9 07/03/2015   HGB 13.8 07/03/2015   HCT 42.2 07/03/2015   MCV 86.4 07/03/2015   PLT 262.0 07/03/2015   Lab Results  Component Value Date   NA 141 03/19/2021   K 3.9 03/19/2021   CO2 32 03/19/2021   GLUCOSE 100 (H) 03/19/2021   BUN 21 03/19/2021   CREATININE 0.90 03/19/2021   BILITOT 0.8 03/19/2021   ALKPHOS 112 03/19/2021   AST 21 03/19/2021   ALT 18 03/19/2021   PROT 7.3 03/19/2021   ALBUMIN 4.7 03/19/2021   CALCIUM 10.0 03/19/2021   GFR 67.55 03/19/2021   Lab Results  Component Value Date   CHOL 158 03/19/2021   Lab Results  Component Value Date   HDL 60.50 03/19/2021   Lab Results  Component Value Date  [place] 78 03/19/2021   Lab Results  Component Value Date   TRIG 95.0 03/19/2021   Lab Results  Component Value Date   CHOLHDL 3 03/19/2021   Lab Results  Component Value Date   HGBA1C 5.8 12/30/2014       Assessment & Plan:   Problem List Items Addressed This Visit       Unprioritized   Vitamin D deficiency   Relevant Orders   VITAMIN D 25 Hydroxy (Vit-D Deficiency, Fractures)   Cardiac murmur    ? Worse than previously Pt will call ins to see where the cheapest place for her to get and echo is        Hyperlipidemia    Tolerating statin, encouraged heart healthy diet, avoid trans fats, minimize simple carbs and saturated fats. Increase exercise as tolerated      Relevant Orders   Lipid panel   Comprehensive metabolic panel   Vitamin P32   TSH   CBC with Differential/Platelet   Primary hypertension - Primary    D/c hctz con't losartan  con't norvasc       Relevant Orders   Lipid panel   Comprehensive metabolic panel   Vitamin R51   TSH   CBC with Differential/Platelet   Other Visit Diagnoses     High risk medication use       Relevant Orders   Drug Monitoring Panel [phone] , Urine       No orders of the defined types were placed in this encounter.   I,Zite Okoli,acting as a Education administrator for [employer], DO.,have documented all relevant documentation on the behalf of [name], DO,as directed by  [name], DO while in the presence of [name], [place], DO., personally preformed the services described in this documentation.  All medical record entries made by the scribe were at my direction and in my presence.  I have reviewed the chart and discharge instructions (if applicable) and agree that the record reflects my personal performance and is accurate and complete. 08/03/2021

## 2021-08-04 DIAGNOSIS — G4733 Obstructive sleep apnea (adult) (pediatric): Secondary | ICD-10-CM | POA: Diagnosis not present

## 2021-08-04 LAB — COMPREHENSIVE METABOLIC PANEL
ALT: 19 U/L (ref 0–35)
AST: 17 U/L (ref 0–37)
Albumin: 4.5 g/dL (ref 3.5–5.2)
Alkaline Phosphatase: 103 U/L (ref 39–117)
BUN: 27 mg/dL — ABNORMAL HIGH (ref 6–23)
CO2: 36 mEq/L — ABNORMAL HIGH (ref 19–32)
Calcium: 10.3 mg/dL (ref 8.4–10.5)
Chloride: 101 mEq/L (ref 96–112)
Creatinine, Ser: 1.02 mg/dL (ref 0.40–1.20)
GFR: 57.98 mL/min — ABNORMAL LOW (ref >60.00)
Glucose, Bld: 117 mg/dL — ABNORMAL HIGH (ref 70–99)
Potassium: 4.7 mEq/L (ref 3.5–5.1)
Sodium: 141 mEq/L (ref 135–145)
Total Bilirubin: 0.7 mg/dL (ref 0.2–1.2)
Total Protein: 6.9 g/dL (ref 6.0–8.3)

## 2021-08-04 LAB — VITAMIN B12: Vitamin B-12: >1504 pg/mL — ABNORMAL HIGH (ref 211–911)

## 2021-08-04 LAB — VITAMIN D 25 HYDROXY (VIT D DEFICIENCY, FRACTURES): VITD: 40.55 ng/mL (ref 30.00–100.00)

## 2021-08-04 LAB — CBC WITH DIFFERENTIAL/PLATELET
Basophils Absolute: 0.1 10*3/uL (ref 0.0–0.1)
Basophils Relative: 0.7 % (ref 0.0–3.0)
Eosinophils Absolute: 0.3 10*3/uL (ref 0.0–0.7)
Eosinophils Relative: 2.7 % (ref 0.0–5.0)
HCT: 39.4 % (ref 36.0–46.0)
Hemoglobin: 13 g/dL (ref 12.0–15.0)
Lymphocytes Relative: 22.3 % (ref 12.0–46.0)
Lymphs Abs: 2.3 10*3/uL (ref 0.7–4.0)
MCHC: 33.1 g/dL (ref 30.0–36.0)
MCV: 85.9 fl (ref 78.0–100.0)
Monocytes Absolute: 0.7 10*3/uL (ref 0.1–1.0)
Monocytes Relative: 6.4 % (ref 3.0–12.0)
Neutro Abs: 6.9 10*3/uL (ref 1.4–7.7)
Neutrophils Relative %: 67.9 % (ref 43.0–77.0)
Platelets: 268 10*3/uL (ref 150.0–400.0)
RBC: 4.58 Mil/uL (ref 3.87–5.11)
RDW: 13 % (ref 11.5–15.5)
WBC: 10.2 10*3/uL (ref 4.0–10.5)

## 2021-08-04 LAB — LIPID PANEL
Cholesterol: 170 mg/dL (ref 0–200)
HDL: 59.2 mg/dL (ref >39.00)
LDL Cholesterol: 82 mg/dL (ref 0–99)
NonHDL: 110.86
Total CHOL/HDL Ratio: 3
Triglycerides: 144 mg/dL (ref 0.0–149.0)
VLDL: 28.8 mg/dL (ref 0.0–40.0)

## 2021-08-04 LAB — TSH: TSH: 1.4 u[IU]/mL (ref 0.35–5.50)

## 2021-08-05 LAB — DRUG MONITORING PANEL 376104, URINE
Amphetamines: NEGATIVE ng/mL (ref <500)
Barbiturates: NEGATIVE ng/mL (ref <300)
Benzodiazepines: NEGATIVE ng/mL (ref <100)
Cocaine Metabolite: NEGATIVE ng/mL (ref <150)
Desmethyltramadol: NEGATIVE ng/mL (ref <100)
Opiates: NEGATIVE ng/mL (ref <100)
Oxycodone: NEGATIVE ng/mL (ref <100)
Tramadol: 512 ng/mL — ABNORMAL HIGH (ref <100)

## 2021-08-05 LAB — DM TEMPLATE

## 2021-08-12 DIAGNOSIS — M5134 Other intervertebral disc degeneration, thoracic region: Secondary | ICD-10-CM | POA: Diagnosis not present

## 2021-08-12 DIAGNOSIS — M9903 Segmental and somatic dysfunction of lumbar region: Secondary | ICD-10-CM | POA: Diagnosis not present

## 2021-08-12 DIAGNOSIS — M9902 Segmental and somatic dysfunction of thoracic region: Secondary | ICD-10-CM | POA: Diagnosis not present

## 2021-08-12 DIAGNOSIS — M5431 Sciatica, right side: Secondary | ICD-10-CM | POA: Diagnosis not present

## 2021-08-12 DIAGNOSIS — M5136 Other intervertebral disc degeneration, lumbar region: Secondary | ICD-10-CM | POA: Diagnosis not present

## 2021-08-12 DIAGNOSIS — M9905 Segmental and somatic dysfunction of pelvic region: Secondary | ICD-10-CM | POA: Diagnosis not present

## 2021-08-25 ENCOUNTER — Other Ambulatory Visit: Payer: Self-pay | Admitting: Family Medicine

## 2021-08-25 DIAGNOSIS — J439 Emphysema, unspecified: Secondary | ICD-10-CM

## 2021-09-01 ENCOUNTER — Other Ambulatory Visit: Payer: Self-pay | Admitting: Family Medicine

## 2021-09-01 DIAGNOSIS — I10 Essential (primary) hypertension: Secondary | ICD-10-CM

## 2021-09-02 DIAGNOSIS — M5134 Other intervertebral disc degeneration, thoracic region: Secondary | ICD-10-CM | POA: Diagnosis not present

## 2021-09-02 DIAGNOSIS — M9905 Segmental and somatic dysfunction of pelvic region: Secondary | ICD-10-CM | POA: Diagnosis not present

## 2021-09-02 DIAGNOSIS — M5431 Sciatica, right side: Secondary | ICD-10-CM | POA: Diagnosis not present

## 2021-09-02 DIAGNOSIS — M9902 Segmental and somatic dysfunction of thoracic region: Secondary | ICD-10-CM | POA: Diagnosis not present

## 2021-09-02 DIAGNOSIS — M5136 Other intervertebral disc degeneration, lumbar region: Secondary | ICD-10-CM | POA: Diagnosis not present

## 2021-09-02 DIAGNOSIS — M9903 Segmental and somatic dysfunction of lumbar region: Secondary | ICD-10-CM | POA: Diagnosis not present

## 2021-09-18 ENCOUNTER — Encounter: Payer: Self-pay | Admitting: Family Medicine

## 2021-09-20 ENCOUNTER — Other Ambulatory Visit: Payer: Self-pay | Admitting: Family Medicine

## 2021-09-20 DIAGNOSIS — E559 Vitamin D deficiency, unspecified: Secondary | ICD-10-CM

## 2021-09-21 NOTE — Telephone Encounter (Signed)
Would you like patient to continue Vit D? ?

## 2021-09-23 ENCOUNTER — Ambulatory Visit: Payer: Medicare PPO | Admitting: Student

## 2021-09-23 ENCOUNTER — Encounter: Payer: Self-pay | Admitting: Student

## 2021-09-23 VITALS — BP 124/74 | HR 82 | Temp 98.0°F | Ht 67.5 in | Wt 255.0 lb

## 2021-09-23 DIAGNOSIS — M9905 Segmental and somatic dysfunction of pelvic region: Secondary | ICD-10-CM | POA: Diagnosis not present

## 2021-09-23 DIAGNOSIS — J9611 Chronic respiratory failure with hypoxia: Secondary | ICD-10-CM | POA: Diagnosis not present

## 2021-09-23 DIAGNOSIS — M9902 Segmental and somatic dysfunction of thoracic region: Secondary | ICD-10-CM | POA: Diagnosis not present

## 2021-09-23 DIAGNOSIS — G4733 Obstructive sleep apnea (adult) (pediatric): Secondary | ICD-10-CM | POA: Diagnosis not present

## 2021-09-23 DIAGNOSIS — M9903 Segmental and somatic dysfunction of lumbar region: Secondary | ICD-10-CM | POA: Diagnosis not present

## 2021-09-23 DIAGNOSIS — M5136 Other intervertebral disc degeneration, lumbar region: Secondary | ICD-10-CM | POA: Diagnosis not present

## 2021-09-23 DIAGNOSIS — M5431 Sciatica, right side: Secondary | ICD-10-CM | POA: Diagnosis not present

## 2021-09-23 DIAGNOSIS — M5134 Other intervertebral disc degeneration, thoracic region: Secondary | ICD-10-CM | POA: Diagnosis not present

## 2021-09-23 NOTE — Patient Instructions (Addendum)
-   Oxygen 2L per Veronica Solomon or facemask with exertion and with sleep ?- CPAP titration study ordered, you'll be called to schedule it ?- let me know if you find out where we should order the echo  ?- stay on trelegy 1 puff once daily, rinse mouth afterward ?- see you in 4 months ?

## 2021-09-23 NOTE — Progress Notes (Signed)
? ?Synopsis: Referred for chronic bronchitis by [name], * ? ?Subjective:  ? ?PATIENT ID: Veronica Solomon [facility] GENDER: female DOB: [birth year], MRN: [id number] ? ?Chief Complaint  ?Patient presents with  ? Follow-up  ?  Breathing is about the same since the last visit. No new co's. She is using her albuterol inhaler 1-2 x per wk.   ? ?[name] with history of smoking who is referred for chronic bronchitis who is enrolled in lung cancer screening. Has been told that she has COPD - if she has done PFTs it's been many years. Had covid twice - in July/August. Was not hospitalized but did get course of paxlovid and steroids. This is only course of steroids she's ever received and it's hard for her to recall effect that it had on her breathing.  ? ?She has DOE over last several years. Can walk on level ground half mile to Solomon mile without issue but with incline or increased speed her exercise tolerance has worsened. She has no orthopnea. Weight has increased 30 lb over last couple of years. She has never had Solomon blood clot. Never had asthma as Solomon kid. Does get some exertional chest tightness, gets better when she stops.  ? ?She is taking trelegy over last 3-4 months, rinses mouth after use. She thinks she's had only minor improvement. Only minor improvement with albuterol as well. ? ?No family history of cancer. Did have family with emphysema, COPD ? ?Smoked for 42y. Pack per day. Quit in 2013. 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 Has lived in [place], moved to [place] in 1980. Apart from living in [place] but has had no exposure to dusts/solvents/particulates without Solomon mask.  ? ?Interval HPI; ?Last seen by me 05/11/21 with plan for PFTs, sleep study. Feeling about the same from DOE standpoint. No significant cough. Rinsing mouth after using trelegy.  ? ?Still using trelegy inhaler. DOE is about the same. Occasional cough. Hasn't needed any courses of prednisone or antibiotics since last  visit.  ? ?Otherwise pertinent review of systems is negative. ? ?Past Medical History:  ?Diagnosis Date  ? Adenomatous colon polyp   ? Allergy   ? C. difficile colitis   ? Diverticulosis   ? Hyperlipidemia   ? Hypertension   ? IBS (irritable bowel syndrome)   ? Nasal congestion 06/14/1998  ? Neuromuscular disorder ([place])   ?  ? ?Family History  ?Problem Relation Age of Onset  ? Hypertension Mother   ? Depression Mother   ? Heart attack Mother   ? Uterine cancer Mother   ? Diabetes Maternal Grandmother   ? Hypertension Paternal Grandmother   ?  ? ?Past Surgical History:  ?Procedure Laterality Date  ? ABDOMINAL HYSTERECTOMY  2002  ? fibroids  ? ? ?Social History  ? ?Socioeconomic History  ? Marital status: Single  ?  Spouse name: Not on file  ? Number of children: Not on file  ? Years of education: Not on file  ? Highest education level: Not on file  ?Occupational History  ? Not on file  ?Tobacco Use  ? Smoking status: Former  ?  Packs/day: 1.00  ?  Years: 42.00  ?  Pack years: 42.00  ?  Types: Cigarettes  ?  Quit date: 12/29/2011  ?  Years since quitting: 9.7  ? Smokeless tobacco: Former  ? Tobacco comments:  ?  Quit 12/2011  ?Vaping Use  ? Vaping Use: Never used  ?Substance  and Sexual Activity  ? Alcohol use: Yes  ?  Comment: rarely  ? Drug use: No  ? Sexual activity: Not Currently  ?Other Topics Concern  ? Not on file  ?Social History Narrative  ? Not on file  ? ?Social Determinants of Health  ? ?Financial Resource Strain: Low Risk   ? Difficulty of Paying Living Expenses: Not hard at all  ?Food Insecurity: No Food Insecurity  ? Worried About Charity fundraiser in the Last Year: Never true  ? Ran Out of Food in the Last Year: Never true  ?Transportation Needs: No Transportation Needs  ? Lack of Transportation (Medical): No  ? Lack of Transportation (Non-Medical): No  ?Physical Activity: Insufficiently Active  ? Days of Exercise per Week: 7 days  ? Minutes of Exercise per Session: 20 min  ?Stress: Stress Concern  Present  ? Feeling of Stress : To some extent  ?Social Connections: Socially Isolated  ? Frequency of Communication with Friends and Family: More than three times Solomon week  ? Frequency of Social Gatherings with Friends and Family: More than three times Solomon week  ? Attends Religious Services: Never  ? Active Member of Clubs or Organizations: No  ? Attends Archivist Meetings: Never  ? Marital Status: Never married  ?Intimate Partner Violence: Not At Risk  ? Fear of Current or Ex-Partner: No  ? Emotionally Abused: No  ? Physically Abused: No  ? Sexually Abused: No  ?  ? ?Allergies  ?Allergen Reactions  ? Paregoric Hives  ?  REACTION: HIVES  ? Penicillins Rash  ?  REACTION: RASH  ? Procaine Hcl Other (See Comments)  ?  ? ?Outpatient Medications Prior to Visit  ?Medication Sig Dispense Refill  ? albuterol (VENTOLIN HFA) 108 (90 Base) MCG/ACT inhaler Inhale 2 puffs into the lungs every 6 (six) hours as needed for wheezing or shortness of breath. 18 g 5  ? amLODipine (NORVASC) 10 MG tablet TAKE 1 TABLET(10 MG) BY MOUTH DAILY 90 tablet 1  ? atorvastatin (LIPITOR) 20 MG tablet TAKE 1 TABLET(20 MG) BY MOUTH DAILY 90 tablet 1  ? celecoxib (CELEBREX) 200 MG capsule Take 1 capsule (200 mg total) by mouth 2 (two) times daily as needed for moderate pain. 180 capsule 3  ? cyanocobalamin (,VITAMIN B-12,) 1000 MCG/ML injection Inject 1 mL (1,000 mcg total) into the muscle every 30 (thirty) days. 3 mL 1  ? DULoxetine (CYMBALTA) 60 MG capsule TAKE 2 CAPSULES BY MOUTH EVERY DAY 180 capsule 1  ? HYDROcodone-acetaminophen (NORCO) 10-325 MG tablet Take 1 tablet by mouth every 8 (eight) hours as needed. 30 tablet 0  ? losartan (COZAAR) 50 MG tablet TAKE 1 TABLET(50 MG) BY MOUTH DAILY 90 tablet 0  ? pregabalin (LYRICA) 75 MG capsule TAKE 1 CAPSULE(75 MG) BY MOUTH TWICE DAILY 180 capsule 2  ? SYRINGE-NEEDLE, DISP, 3 ML (LUER LOCK SAFETY SYRINGES) 25G X 5/8" 3 ML MISC Use as directed once Solomon month to give b12 injection 10 each 1  ?  traMADol (ULTRAM) 50 MG tablet TAKE 1 TABLET(50 MG) BY MOUTH EVERY 6 HOURS AS NEEDED 90 tablet 0  ? TRELEGY ELLIPTA 100-62.5-25 MCG/ACT AEPB INHALE 1 PUFF INTO THE LUNGS DAILY 60 each 1  ? Vitamin D, Ergocalciferol, (DRISDOL) 1.25 MG (50000 UNIT) CAPS capsule TAKE 1 CAPSULE BY MOUTH EVERY 7 DAYS 12 capsule 1  ? ?No facility-administered medications prior to visit.  ? ? ? ? ? ?Objective:  ? ?Physical Exam: ? ?General  appearance: 66 y.o., female, NAD, conversant  ?Eyes: anicteric sclerae; PERRL, tracking appropriately ?HENT: NCAT; MMM ?Neck: Trachea midline; no lymphadenopathy, no JVD ?Lungs: CTAB, no crackles, no wheeze, with normal respiratory effort ?CV: RRR, murmur heard at last visit is harder to appreciate today ?Abdomen: Soft, non-tender; non-distended, BS present  ?Extremities: No peripheral edema, warm ?Skin: Normal turgor and texture; no rash ?Psych: Appropriate affect ?Neuro: Alert and oriented to person and place, no focal deficit  ? ? ? ?Vitals:  ? 09/23/21 1332  ?BP: 124/74  ?Pulse: 82  ?Temp: 98 ?F (36.7 ?C)  ?TempSrc: Oral  ?SpO2: 93%  ?Weight: 255 lb (115.7 kg)  ?Height: 5' 7.5" (1.715 m)  ? ? ? ?93% on RA ?BMI Readings from Last 3 Encounters:  ?09/23/21 39.35 kg/m?  ?08/03/21 39.60 kg/m?  ?07/29/21 39.97 kg/m?  ? ?Wt Readings from Last 3 Encounters:  ?09/23/21 255 lb (115.7 kg)  ?08/03/21 256 lb 9.6 oz (116.4 kg)  ?07/29/21 259 lb (117.5 kg)  ? ? ? ?CBC ?   ?Component Value Date/Time  ? WBC 10.2 08/03/2021 1407  ? RBC 4.58 08/03/2021 1407  ? HGB 13.0 08/03/2021 1407  ? HCT 39.4 08/03/2021 1407  ? PLT 268.0 08/03/2021 1407  ? MCV 85.9 08/03/2021 1407  ? MCHC 33.1 08/03/2021 1407  ? RDW 13.0 08/03/2021 1407  ? LYMPHSABS 2.3 08/03/2021 1407  ? MONOABS 0.7 08/03/2021 1407  ? EOSABS 0.3 08/03/2021 1407  ? BASOSABS 0.1 08/03/2021 1407  ? ? ? ? ?Chest Imaging: ?CT Chest 07/02/20 reviewed by me remarkable for multiple stable bilateral ggo/subsolid nodules, emphysema, mild bronchial wall thickening ? ?CT Chest  07/30/21 reviewed by me - mild emphysema and stable small nodules ? ?Pulmonary Functions Testing Results: ? ?  Latest Ref Rng & Units 07/29/2021  ?  1:00 PM  ?PFT Results  ?FVC-Pre L 2.70    ?FVC-Predicted Pre %

## 2021-09-24 ENCOUNTER — Telehealth: Payer: Self-pay | Admitting: Student

## 2021-09-24 DIAGNOSIS — J439 Emphysema, unspecified: Secondary | ICD-10-CM

## 2021-09-24 NOTE — Telephone Encounter (Signed)
New order placed per Melissa with [employer] saying that it is for a POC device. Patient qualified in office with Magda Paganini on 09/23/21. Patient even states that she wants a POC device. Order placed. Nothing further needed  ?

## 2021-10-07 DIAGNOSIS — J9621 Acute and chronic respiratory failure with hypoxia: Secondary | ICD-10-CM | POA: Diagnosis not present

## 2021-10-13 ENCOUNTER — Other Ambulatory Visit: Payer: Self-pay | Admitting: Family Medicine

## 2021-10-13 DIAGNOSIS — G894 Chronic pain syndrome: Secondary | ICD-10-CM

## 2021-10-14 DIAGNOSIS — M9905 Segmental and somatic dysfunction of pelvic region: Secondary | ICD-10-CM | POA: Diagnosis not present

## 2021-10-14 DIAGNOSIS — M9903 Segmental and somatic dysfunction of lumbar region: Secondary | ICD-10-CM | POA: Diagnosis not present

## 2021-10-14 DIAGNOSIS — M9902 Segmental and somatic dysfunction of thoracic region: Secondary | ICD-10-CM | POA: Diagnosis not present

## 2021-10-14 DIAGNOSIS — M5431 Sciatica, right side: Secondary | ICD-10-CM | POA: Diagnosis not present

## 2021-10-14 DIAGNOSIS — M5134 Other intervertebral disc degeneration, thoracic region: Secondary | ICD-10-CM | POA: Diagnosis not present

## 2021-10-14 DIAGNOSIS — M5136 Other intervertebral disc degeneration, lumbar region: Secondary | ICD-10-CM | POA: Diagnosis not present

## 2021-10-14 MED ORDER — HYDROCODONE-ACETAMINOPHEN 10-325 MG PO TABS: 1.0000 | ORAL_TABLET | Freq: Three times a day (TID) | ORAL | 0 refills | Status: DC | PRN

## 2021-10-14 NOTE — Telephone Encounter (Signed)
Requesting: hydrocodone ?Contract: 08/03/21 ?UDS: 08/03/21 ?Last Visit: 08/03/21 ?Next Visit: 11/02/21 ?Last Refill: 02/01/21 ? ?Please Advise ? ?

## 2021-10-23 ENCOUNTER — Other Ambulatory Visit: Payer: Self-pay | Admitting: Family Medicine

## 2021-10-23 DIAGNOSIS — I10 Essential (primary) hypertension: Secondary | ICD-10-CM

## 2021-11-02 ENCOUNTER — Other Ambulatory Visit: Payer: Self-pay | Admitting: Family Medicine

## 2021-11-02 ENCOUNTER — Encounter: Payer: Medicare PPO | Admitting: Family Medicine

## 2021-11-02 DIAGNOSIS — E559 Vitamin D deficiency, unspecified: Secondary | ICD-10-CM

## 2021-11-02 DIAGNOSIS — I10 Essential (primary) hypertension: Secondary | ICD-10-CM

## 2021-11-02 DIAGNOSIS — E785 Hyperlipidemia, unspecified: Secondary | ICD-10-CM

## 2021-11-02 DIAGNOSIS — E538 Deficiency of other specified B group vitamins: Secondary | ICD-10-CM

## 2021-11-04 DIAGNOSIS — M5431 Sciatica, right side: Secondary | ICD-10-CM | POA: Diagnosis not present

## 2021-11-04 DIAGNOSIS — M5134 Other intervertebral disc degeneration, thoracic region: Secondary | ICD-10-CM | POA: Diagnosis not present

## 2021-11-04 DIAGNOSIS — M5136 Other intervertebral disc degeneration, lumbar region: Secondary | ICD-10-CM | POA: Diagnosis not present

## 2021-11-04 DIAGNOSIS — M9903 Segmental and somatic dysfunction of lumbar region: Secondary | ICD-10-CM | POA: Diagnosis not present

## 2021-11-04 DIAGNOSIS — M9905 Segmental and somatic dysfunction of pelvic region: Secondary | ICD-10-CM | POA: Diagnosis not present

## 2021-11-04 DIAGNOSIS — M9902 Segmental and somatic dysfunction of thoracic region: Secondary | ICD-10-CM | POA: Diagnosis not present

## 2021-11-06 DIAGNOSIS — J9621 Acute and chronic respiratory failure with hypoxia: Secondary | ICD-10-CM | POA: Diagnosis not present

## 2021-11-12 ENCOUNTER — Encounter: Payer: Self-pay | Admitting: Family Medicine

## 2021-11-12 ENCOUNTER — Encounter: Payer: Self-pay | Admitting: *Deleted

## 2021-11-12 ENCOUNTER — Ambulatory Visit (INDEPENDENT_AMBULATORY_CARE_PROVIDER_SITE_OTHER): Payer: Medicare PPO | Admitting: Family Medicine

## 2021-11-12 VITALS — BP 130/72 | HR 84 | Temp 98.4°F | Resp 18 | Ht 67.5 in | Wt 253.0 lb

## 2021-11-12 DIAGNOSIS — J9611 Chronic respiratory failure with hypoxia: Secondary | ICD-10-CM | POA: Diagnosis not present

## 2021-11-12 DIAGNOSIS — F320 Major depressive disorder, single episode, mild: Secondary | ICD-10-CM | POA: Diagnosis not present

## 2021-11-12 DIAGNOSIS — E538 Deficiency of other specified B group vitamins: Secondary | ICD-10-CM

## 2021-11-12 DIAGNOSIS — Z9981 Dependence on supplemental oxygen: Secondary | ICD-10-CM | POA: Diagnosis not present

## 2021-11-12 DIAGNOSIS — E785 Hyperlipidemia, unspecified: Secondary | ICD-10-CM | POA: Diagnosis not present

## 2021-11-12 DIAGNOSIS — G894 Chronic pain syndrome: Secondary | ICD-10-CM

## 2021-11-12 DIAGNOSIS — E2839 Other primary ovarian failure: Secondary | ICD-10-CM

## 2021-11-12 DIAGNOSIS — I10 Essential (primary) hypertension: Secondary | ICD-10-CM | POA: Diagnosis not present

## 2021-11-12 DIAGNOSIS — E559 Vitamin D deficiency, unspecified: Secondary | ICD-10-CM | POA: Diagnosis not present

## 2021-11-12 DIAGNOSIS — Z0000 Encounter for general adult medical examination without abnormal findings: Secondary | ICD-10-CM

## 2021-11-12 NOTE — Progress Notes (Addendum)
Subjective:   By signing my name below, I, [name], attest that this documentation has been prepared under the direction and in the presence of Roma Schanz DO, 11/12/2021   Patient ID: Veronica Solomon, female    DOB: 1955-07-20, 66 y.o.   MRN: [id number]  Chief Complaint  Patient presents with   Annual Exam    Pt states fasting     HPI Patient is in today for a comprehensive physical exam.   She complains of skins tags around the neck area. She states that the sleeping machine that she uses during the night rubs against her skin tag which causes the irritation in the area. She is interested in getting rid of her skin tags as a way to minimize disruption and enhance her sleep quality.   She states that her pulmonologist noticed a heart murmur. Once she determines the most affordable option, she plans to get a TTE study and will follow up with her pulmonologist.   She is interested in getting her vitamin B-12 and D levels checked.   She reports that she is satisfied with her blood pressure.  BP Readings from Last 3 Encounters:  11/12/21 130/72  09/23/21 124/74  08/03/21 118/60   Pulse Readings from Last 3 Encounters:  11/12/21 84  09/23/21 82  08/03/21 83   She denies having any fever, ear pain, new muscle pain, joint pain, new moles, congestion, sinus pain, sore throat, palpations, wheezing, n/v/d, constipation, blood in stool, dysuria, frequency, hematuria at this time.    She reports no recent surgeries and denies of any changes to her family medical history. She reports that she is self-employed Colonoscopy was last completed 01/30/2009.  Dexa was last completed on 01/21/2009. She is interested in getting her Dexa.  Mammogram was last done on 03/11/2021.  She states that she received three doses of COVID vaccine. She received her first dose of the Shingrix vaccine She reports that she walks regularly and is active due to her occupation.  She is not UTD  on dental exams.  She is UTD on vision exams.    Past Medical History:  Diagnosis Date   Adenomatous colon polyp    Allergy    C. difficile colitis    Diverticulosis    Hyperlipidemia    Hypertension    IBS (irritable bowel syndrome)    Nasal congestion 06/14/1998   Neuromuscular disorder ([place])     Past Surgical History:  Procedure Laterality Date   ABDOMINAL HYSTERECTOMY  2002   fibroids    Family History  Problem Relation Age of Onset   Hypertension Mother    Depression Mother    Heart attack Mother    Uterine cancer Mother    Diabetes Maternal Grandmother    Hypertension Paternal Grandmother     Social History   Socioeconomic History   Marital status: Single    Spouse name: Not on file   Number of children: Not on file   Years of education: Not on file   Highest education level: Not on file  Occupational History   Occupation: self employed -- Herbalist  Tobacco Use   Smoking status: Former    Packs/day: 1.00    Years: 42.00    Pack years: 42.00    Types: Cigarettes    Quit date: 12/29/2011    Years since quitting: 9.8   Smokeless tobacco: Former   Tobacco comments:    Quit 12/2011  Vaping Use   Vaping Use:  Never used  Substance and Sexual Activity   Alcohol use: Yes    Comment: rarely   Drug use: No   Sexual activity: Not Currently  Other Topics Concern   Not on file  Social History Narrative   Exercise -- training dogs    Social Determinants of Health   Financial Resource Strain: Low Risk    Difficulty of Paying Living Expenses: Not hard at all  Food Insecurity: No Food Insecurity   Worried About Charity fundraiser in the Last Year: Never true   [place] in the Last Year: Never true  Transportation Needs: No Transportation Needs   Lack of Transportation (Medical): No   Lack of Transportation (Non-Medical): No  Physical Activity: Insufficiently Active   Days of Exercise per Week: 7 days   Minutes of Exercise per Session: 20 min   Stress: Stress Concern Present   Feeling of Stress : To some extent  Social Connections: Socially Isolated   Frequency of Communication with Friends and Family: More than three times a week   Frequency of Social Gatherings with Friends and Family: More than three times a week   Attends Religious Services: Never   Marine scientist or Organizations: No   Attends Music therapist: Never   Marital Status: Never married  Human resources officer Violence: Not At Risk   Fear of Current or Ex-Partner: No   Emotionally Abused: No   Physically Abused: No   Sexually Abused: No    Outpatient Medications Prior to Visit  Medication Sig Dispense Refill   albuterol (VENTOLIN HFA) 108 (90 Base) MCG/ACT inhaler Inhale 2 puffs into the lungs every 6 (six) hours as needed for wheezing or shortness of breath. 18 g 5   amLODipine (NORVASC) 10 MG tablet TAKE 1 TABLET(10 MG) BY MOUTH DAILY 90 tablet 1   atorvastatin (LIPITOR) 20 MG tablet TAKE 1 TABLET(20 MG) BY MOUTH DAILY 90 tablet 1   celecoxib (CELEBREX) 200 MG capsule Take 1 capsule (200 mg total) by mouth 2 (two) times daily as needed for moderate pain. 180 capsule 3   cyanocobalamin (,VITAMIN B-12,) 1000 MCG/ML injection Inject 1 mL (1,000 mcg total) into the muscle every 30 (thirty) days. 3 mL 1   DULoxetine (CYMBALTA) 60 MG capsule TAKE 2 CAPSULES BY MOUTH EVERY DAY 180 capsule 1   HYDROcodone-acetaminophen (NORCO) 10-325 MG tablet Take 1 tablet by mouth every 8 (eight) hours as needed. 30 tablet 0   pregabalin (LYRICA) 75 MG capsule TAKE 1 CAPSULE(75 MG) BY MOUTH TWICE DAILY 180 capsule 2   SYRINGE-NEEDLE, DISP, 3 ML (LUER LOCK SAFETY SYRINGES) 25G X 5/8" 3 ML MISC Use as directed once a month to give b12 injection 10 each 1   traMADol (ULTRAM) 50 MG tablet TAKE 1 TABLET(50 MG) BY MOUTH EVERY 6 HOURS AS NEEDED 90 tablet 0   TRELEGY ELLIPTA 100-62.5-25 MCG/ACT AEPB INHALE 1 PUFF INTO THE LUNGS DAILY 60 each 1   Vitamin D, Ergocalciferol,  (DRISDOL) 1.25 MG (50000 UNIT) CAPS capsule TAKE 1 CAPSULE BY MOUTH EVERY 7 DAYS 12 capsule 1   losartan (COZAAR) 50 MG tablet TAKE 1 TABLET(50 MG) BY MOUTH DAILY (Patient not taking: Reported on 11/12/2021) 90 tablet 0   No facility-administered medications prior to visit.    Allergies  Allergen Reactions   Paregoric Hives    REACTION: HIVES   Penicillins Rash    REACTION: RASH   Procaine Hcl Other (See Comments)  Review of Systems  Constitutional:  Negative for fever and malaise/fatigue.  HENT:  Negative for congestion, sinus pain and sore throat.   Eyes:  Negative for blurred vision.  Respiratory:  Negative for cough, shortness of breath and wheezing.   Cardiovascular:  Negative for chest pain, palpitations and leg swelling.  Gastrointestinal:  Negative for blood in stool, constipation, diarrhea, nausea and vomiting.  Genitourinary:  Negative for dysuria.  Musculoskeletal:  Negative for back pain, joint pain and myalgias.  Skin:  Negative for rash.       (-) New Moles (+) Skin Tags  Neurological:  Negative for loss of consciousness and headaches.      Objective:    Physical Exam Vitals and nursing note reviewed.  Constitutional:      General: She is not in acute distress.    Appearance: Normal appearance. She is not ill-appearing.  HENT:     Head: Normocephalic and atraumatic.     Right Ear: External ear normal.     Left Ear: External ear normal.  Eyes:     Extraocular Movements: Extraocular movements intact.     Pupils: Pupils are equal, round, and reactive to light.  Cardiovascular:     Rate and Rhythm: Normal rate and regular rhythm.  Pulmonary:     Effort: Pulmonary effort is normal. No respiratory distress.     Breath sounds: Normal breath sounds. No wheezing or rales.  Skin:    General: Skin is warm and dry.  Neurological:     Mental Status: She is alert and oriented to person, place, and time.  Psychiatric:        Judgment: Judgment normal.    BP  130/72 (BP Location: Right Arm, Patient Position: Sitting, Cuff Size: Large)   Pulse 84   Temp 98.4 F (36.9 C) (Oral)   Resp 18   Ht 5' 7.5" (1.715 m)   Wt 253 lb (114.8 kg)   SpO2 93%   BMI 39.04 kg/m  Wt Readings from Last 3 Encounters:  11/12/21 253 lb (114.8 kg)  09/23/21 255 lb (115.7 kg)  08/03/21 256 lb 9.6 oz (116.4 kg)    Diabetic Foot Exam - Simple   No data filed    Lab Results  Component Value Date   WBC 10.2 08/03/2021   HGB 13.0 08/03/2021   HCT 39.4 08/03/2021   PLT 268.0 08/03/2021   GLUCOSE 117 (H) 08/03/2021   CHOL 170 08/03/2021   TRIG 144.0 08/03/2021   HDL 59.20 08/03/2021   LDLDIRECT 195.7 01/12/2010   LDLCALC 82 08/03/2021   ALT 19 08/03/2021   AST 17 08/03/2021   NA 141 08/03/2021   K 4.7 08/03/2021   CL 101 08/03/2021   CREATININE 1.02 08/03/2021   BUN 27 (H) 08/03/2021   CO2 36 (H) 08/03/2021   TSH 1.40 08/03/2021   HGBA1C 5.8 12/30/2014   MICROALBUR 4.1 (H) 11/24/2012    Lab Results  Component Value Date   TSH 1.40 08/03/2021   Lab Results  Component Value Date   WBC 10.2 08/03/2021   HGB 13.0 08/03/2021   HCT 39.4 08/03/2021   MCV 85.9 08/03/2021   PLT 268.0 08/03/2021   Lab Results  Component Value Date   NA 141 08/03/2021   K 4.7 08/03/2021   CO2 36 (H) 08/03/2021   GLUCOSE 117 (H) 08/03/2021   BUN 27 (H) 08/03/2021   CREATININE 1.02 08/03/2021   BILITOT 0.7 08/03/2021   ALKPHOS 103 08/03/2021   AST 17  08/03/2021   ALT 19 08/03/2021   PROT 6.9 08/03/2021   ALBUMIN 4.5 08/03/2021   CALCIUM 10.3 08/03/2021   GFR 57.98 (L) 08/03/2021   Lab Results  Component Value Date   CHOL 170 08/03/2021   Lab Results  Component Value Date   HDL 59.20 08/03/2021   Lab Results  Component Value Date   LDLCALC 82 08/03/2021   Lab Results  Component Value Date   TRIG 144.0 08/03/2021   Lab Results  Component Value Date   CHOLHDL 3 08/03/2021   Lab Results  Component Value Date   HGBA1C 5.8 12/30/2014        Assessment & Plan:   Problem List Items Addressed This Visit       Unprioritized   Current mild episode of major depressive disorder ([place])   B12 deficiency   Relevant Orders   Vitamin B12   Vitamin D deficiency    Check labs        Relevant Orders   VITAMIN D 25 Hydroxy (Vit-D Deficiency, Fractures)   Primary hypertension    Well controlled, no changes to meds. Encouraged heart healthy diet such as the DASH diet and exercise as tolerated.        Relevant Orders   CBC with Differential/Platelet   Comprehensive metabolic panel   Lipid panel   Hyperlipidemia    Encourage heart healthy diet such as MIND or DASH diet, increase exercise, avoid trans fats, simple carbohydrates and processed foods, consider a krill or fish or flaxseed oil cap daily.        Relevant Orders   Comprehensive metabolic panel   Lipid panel   Chronic pain syndrome    Stable  uds / contract utd       Other Visit Diagnoses     Preventative health care    -  Primary   Estrogen deficiency       Relevant Orders   DG Bone Density   Chronic respiratory failure with hypoxia, on home O2 therapy [facility])       Relevant Orders   Ambulatory referral to Cardiology         No orders of the defined types were placed in this encounter.   IAnn Held, DO, personally preformed the services described in this documentation.  All medical record entries made by the scribe were at my direction and in my presence.  I have reviewed the chart and discharge instructions (if applicable) and agree that the record reflects my personal performance and is accurate and complete. 11/12/2021   I,Amber Collins,acting as a scribe for [name], DO.,have documented all relevant documentation on the behalf of [name], DO,as directed by  [name], DO while in the presence of [name], DO.     [name], DO

## 2021-11-12 NOTE — Assessment & Plan Note (Signed)
Encourage heart healthy diet such as MIND or DASH diet, increase exercise, avoid trans fats, simple carbohydrates and processed foods, consider a krill or fish or flaxseed oil cap daily.  °

## 2021-11-12 NOTE — Assessment & Plan Note (Signed)
Check labs 

## 2021-11-12 NOTE — Patient Instructions (Signed)
Preventive Care 65 Years and Older, Female Preventive care refers to lifestyle choices and visits with your health care provider that can promote health and wellness. Preventive care visits are also called wellness exams. What can I expect for my preventive care visit? Counseling Your health care provider may ask you questions about your: Medical history, including: Past medical problems. Family medical history. Pregnancy and menstrual history. History of falls. Current health, including: Memory and ability to understand (cognition). Emotional well-being. Home life and relationship well-being. Sexual activity and sexual health. Lifestyle, including: Alcohol, nicotine or tobacco, and drug use. Access to firearms. Diet, exercise, and sleep habits. Work and work environment. Sunscreen use. Safety issues such as seatbelt and bike helmet use. Physical exam Your health care provider will check your: Height and weight. These may be used to calculate your BMI (body mass index). BMI is a measurement that tells if you are at a healthy weight. Waist circumference. This measures the distance around your waistline. This measurement also tells if you are at a healthy weight and may help predict your risk of certain diseases, such as type 2 diabetes and high blood pressure. Heart rate and blood pressure. Body temperature. Skin for abnormal spots. What immunizations do I need?  Vaccines are usually given at various ages, according to a schedule. Your health care provider will recommend vaccines for you based on your age, medical history, and lifestyle or other factors, such as travel or where you work. What tests do I need? Screening Your health care provider may recommend screening tests for certain conditions. This may include: Lipid and cholesterol levels. Hepatitis C test. Hepatitis B test. HIV (human immunodeficiency virus) test. STI (sexually transmitted infection) testing, if you are at  risk. Lung cancer screening. Colorectal cancer screening. Diabetes screening. This is done by checking your blood sugar (glucose) after you have not eaten for a while (fasting). Mammogram. Talk with your health care provider about how often you should have regular mammograms. BRCA-related cancer screening. This may be done if you have a family history of breast, ovarian, tubal, or peritoneal cancers. Bone density scan. This is done to screen for osteoporosis. Talk with your health care provider about your test results, treatment options, and if necessary, the need for more tests. Follow these instructions at home: Eating and drinking  Eat a diet that includes fresh fruits and vegetables, whole grains, lean protein, and low-fat dairy products. Limit your intake of foods with high amounts of sugar, saturated fats, and salt. Take vitamin and mineral supplements as recommended by your health care provider. Do not drink alcohol if your health care provider tells you not to drink. If you drink alcohol: Limit how much you have to 0-1 drink a day. Know how much alcohol is in your drink. In the [place] one drink equals one 12 oz bottle of beer (355 mL), one 5 oz glass of wine (148 mL), or one 1 oz glass of hard liquor (44 mL). Lifestyle Brush your teeth every morning and night with fluoride toothpaste. Floss one time each day. Exercise for at least 30 minutes 5 or more days each week. Do not use any products that contain nicotine or tobacco. These products include cigarettes, chewing tobacco, and vaping devices, such as e-cigarettes. If you need help quitting, ask your health care provider. Do not use drugs. If you are sexually active, practice safe sex. Use a condom or other form of protection in order to prevent STIs. Take aspirin only as told by   your health care provider. Make sure that you understand how much to take and what form to take. Work with your health care provider to find out whether it  is safe and beneficial for you to take aspirin daily. Ask your health care provider if you need to take a cholesterol-lowering medicine (statin). Find healthy ways to manage stress, such as: Meditation, yoga, or listening to music. Journaling. Talking to a trusted person. Spending time with friends and family. Minimize exposure to UV radiation to reduce your risk of skin cancer. Safety Always wear your seat belt while driving or riding in a vehicle. Do not drive: If you have been drinking alcohol. Do not ride with someone who has been drinking. When you are tired or distracted. While texting. If you have been using any mind-altering substances or drugs. Wear a helmet and other protective equipment during sports activities. If you have firearms in your house, make sure you follow all gun safety procedures. What's next? Visit your health care provider once a year for an annual wellness visit. Ask your health care provider how often you should have your eyes and teeth checked. Stay up to date on all vaccines. This information is not intended to replace advice given to you by your health care provider. Make sure you discuss any questions you have with your health care provider. Document Revised: 11/26/2020 Document Reviewed: 11/26/2020 Elsevier Patient Education  2023 Elsevier Inc.  

## 2021-11-12 NOTE — Assessment & Plan Note (Signed)
Well controlled, no changes to meds. Encouraged heart healthy diet such as the DASH diet and exercise as tolerated.  °

## 2021-11-12 NOTE — Assessment & Plan Note (Signed)
Stable  uds / contract utd

## 2021-11-13 ENCOUNTER — Encounter: Payer: Self-pay | Admitting: Family Medicine

## 2021-11-13 LAB — COMPREHENSIVE METABOLIC PANEL
ALT: 15 U/L (ref 0–35)
AST: 16 U/L (ref 0–37)
Albumin: 4.5 g/dL (ref 3.5–5.2)
Alkaline Phosphatase: 102 U/L (ref 39–117)
BUN: 17 mg/dL (ref 6–23)
CO2: 31 mEq/L (ref 19–32)
Calcium: 10 mg/dL (ref 8.4–10.5)
Chloride: 103 mEq/L (ref 96–112)
Creatinine, Ser: 0.82 mg/dL (ref 0.40–1.20)
GFR: 75.19 mL/min (ref >60.00)
Glucose, Bld: 101 mg/dL — ABNORMAL HIGH (ref 70–99)
Potassium: 5.5 mEq/L — ABNORMAL HIGH (ref 3.5–5.1)
Sodium: 140 mEq/L (ref 135–145)
Total Bilirubin: 0.6 mg/dL (ref 0.2–1.2)
Total Protein: 7.2 g/dL (ref 6.0–8.3)

## 2021-11-13 LAB — LIPID PANEL
Cholesterol: 176 mg/dL (ref 0–200)
HDL: 61.7 mg/dL (ref >39.00)
LDL Cholesterol: 95 mg/dL (ref 0–99)
NonHDL: 114.03
Total CHOL/HDL Ratio: 3
Triglycerides: 93 mg/dL (ref 0.0–149.0)
VLDL: 18.6 mg/dL (ref 0.0–40.0)

## 2021-11-13 LAB — CBC WITH DIFFERENTIAL/PLATELET
Basophils Absolute: 0.1 10*3/uL (ref 0.0–0.1)
Basophils Relative: 0.9 % (ref 0.0–3.0)
Eosinophils Absolute: 0.2 10*3/uL (ref 0.0–0.7)
Eosinophils Relative: 2.7 % (ref 0.0–5.0)
HCT: 39.7 % (ref 36.0–46.0)
Hemoglobin: 13.1 g/dL (ref 12.0–15.0)
Lymphocytes Relative: 26.2 % (ref 12.0–46.0)
Lymphs Abs: 2.4 10*3/uL (ref 0.7–4.0)
MCHC: 33.1 g/dL (ref 30.0–36.0)
MCV: 87.6 fl (ref 78.0–100.0)
Monocytes Absolute: 0.6 10*3/uL (ref 0.1–1.0)
Monocytes Relative: 6.9 % (ref 3.0–12.0)
Neutro Abs: 5.9 10*3/uL (ref 1.4–7.7)
Neutrophils Relative %: 63.3 % (ref 43.0–77.0)
Platelets: 268 10*3/uL (ref 150.0–400.0)
RBC: 4.53 Mil/uL (ref 3.87–5.11)
RDW: 13.2 % (ref 11.5–15.5)
WBC: 9.3 10*3/uL (ref 4.0–10.5)

## 2021-11-13 LAB — VITAMIN B12: Vitamin B-12: 505 pg/mL (ref 211–911)

## 2021-11-13 LAB — VITAMIN D 25 HYDROXY (VIT D DEFICIENCY, FRACTURES): VITD: 38.38 ng/mL (ref 30.00–100.00)

## 2021-11-16 ENCOUNTER — Other Ambulatory Visit: Payer: Self-pay | Admitting: Family Medicine

## 2021-11-19 NOTE — Progress Notes (Signed)
Cardiology Office Note:   Date:  11/20/2021  NAME:  Veronica Solomon    MRN: [id number] DOB:  05/01/1956   PCP:  [name], DO  Cardiologist:  None  Electrophysiologist:  None   Referring MD: Carollee Herter, Alferd Apa, *   Chief Complaint  Patient presents with   Heart Murmur   History of Present Illness:   Veronica Solomon is a 66 y.o. female with a hx of COPD, HTN, HLD who is being seen today for the evaluation of murmur at the request of [name], DO.  She presents for evaluation of murmur.  She has a faint 2 out of 6 systolic ejection murmur.  Echocardiogram in 2015 was unremarkable.  She does have COPD.  She also has hypertension hyperlipidemia.  Recent CT chest imaging does demonstrate mild coronary calcifications.  On a statin.  Most recent LDL 95.  Likely at goal.  She denies any chest pains or chest tightness.  She does get short of breath with activity.  Blood pressure is elevated today.  She reports normally it is well controlled.  Suspect this could be due to a new visit.  She is a former smoker of 40+ years.  She quit several years ago.  No history of heart attack or stroke personally.  Her mother did have a heart attack.  Currently retired.  Working as a Geologist, engineering intermittently.  Denies any chest discomfort.  EKG in office is normal.   Problem List COPD -mild HTN HLD -T chol 176, HDL 61, LDL 95, TG 93  Past Medical History: Past Medical History:  Diagnosis Date   Adenomatous colon polyp    Allergy    C. difficile colitis    Diverticulosis    Hyperlipidemia    Hypertension    IBS (irritable bowel syndrome)    Nasal congestion 06/14/1998   Neuromuscular disorder ([place])     Past Surgical History: Past Surgical History:  Procedure Laterality Date   ABDOMINAL HYSTERECTOMY  2002   fibroids    Current Medications: Current Meds  Medication Sig   albuterol (VENTOLIN HFA) 108 (90 Base) MCG/ACT inhaler Inhale 2 puffs into the lungs  every 6 (six) hours as needed for wheezing or shortness of breath.   amLODipine (NORVASC) 10 MG tablet TAKE 1 TABLET(10 MG) BY MOUTH DAILY   atorvastatin (LIPITOR) 20 MG tablet TAKE 1 TABLET(20 MG) BY MOUTH DAILY   celecoxib (CELEBREX) 200 MG capsule Take 1 capsule (200 mg total) by mouth 2 (two) times daily as needed for moderate pain.   cyanocobalamin (,VITAMIN B-12,) 1000 MCG/ML injection Inject 1 mL (1,000 mcg total) into the muscle every 30 (thirty) days.   DULoxetine (CYMBALTA) 60 MG capsule TAKE 2 CAPSULES BY MOUTH EVERY DAY   HYDROcodone-acetaminophen (NORCO) 10-325 MG tablet Take 1 tablet by mouth every 8 (eight) hours as needed.   pregabalin (LYRICA) 75 MG capsule TAKE 1 CAPSULE(75 MG) BY MOUTH TWICE DAILY   SYRINGE-NEEDLE, DISP, 3 ML (LUER LOCK SAFETY SYRINGES) 25G X 5/8" 3 ML MISC Use as directed once a month to give b12 injection   traMADol (ULTRAM) 50 MG tablet TAKE 1 TABLET(50 MG) BY MOUTH EVERY 6 HOURS AS NEEDED   TRELEGY ELLIPTA 100-62.5-25 MCG/ACT AEPB INHALE 1 PUFF INTO THE LUNGS DAILY   Vitamin D, Ergocalciferol, (DRISDOL) 1.25 MG (50000 UNIT) CAPS capsule TAKE 1 CAPSULE BY MOUTH EVERY 7 DAYS     Allergies:    Paregoric, Penicillins, and Procaine  hcl   Social History: Social History   Socioeconomic History   Marital status: Single    Spouse name: Not on file   Number of children: Not on file   Years of education: Not on file   Highest education level: Not on file  Occupational History   Occupation: self employed -- Herbalist  Tobacco Use   Smoking status: Former    Packs/day: 1.00    Years: 42.00    Total pack years: 42.00    Types: Cigarettes    Quit date: 12/29/2011    Years since quitting: 9.9   Smokeless tobacco: Former   Tobacco comments:    Quit 12/2011  Vaping Use   Vaping Use: Never used  Substance and Sexual Activity   Alcohol use: Yes    Comment: rarely   Drug use: No   Sexual activity: Not Currently  Other Topics Concern   Not on file   Social History Narrative   Exercise -- training dogs    Social Determinants of Health   Financial Resource Strain: Low Risk  (02/11/2021)   Overall Financial Resource Strain (CARDIA)    Difficulty of Paying Living Expenses: Not hard at all  Food Insecurity: No Food Insecurity (02/11/2021)   Hunger Vital Sign    Worried About Running Out of Food in the Last Year: Never true    [place] in the Last Year: Never true  Transportation Needs: No Transportation Needs (02/11/2021)   PRAPARE - Hydrologist (Medical): No    Lack of Transportation (Non-Medical): No  Physical Activity: Insufficiently Active (02/11/2021)   Exercise Vital Sign    Days of Exercise per Week: 7 days    Minutes of Exercise per Session: 20 min  Stress: Stress Concern Present (02/11/2021)   [place]    Feeling of Stress : To some extent  Social Connections: Socially Isolated (02/11/2021)   Social Connection and Isolation Panel [NHANES]    Frequency of Communication with Friends and Family: More than three times a week    Frequency of Social Gatherings with Friends and Family: More than three times a week    Attends Religious Services: Never    Marine scientist or Organizations: No    Attends Music therapist: Never    Marital Status: Never married     Family History: The patient's family history includes Depression in her mother; Diabetes in her maternal grandmother; Heart attack in her mother; Hypertension in her mother and paternal grandmother; Uterine cancer in her mother.  ROS:   All other ROS reviewed and negative. Pertinent positives noted in the HPI.     EKGs/Labs/Other Studies Reviewed:   The following studies were personally reviewed by me today:  EKG:  EKG is ordered today.  The ekg ordered today demonstrates normal sinus rhythm heart rate 73, no acute ischemic changes or evidence of  infarction, and was personally reviewed by me.   Recent Labs: 05/11/2021: Pro B Natriuretic peptide (BNP) 14.0 08/03/2021: TSH 1.40 11/12/2021: ALT 15; BUN 17; Creatinine, Ser 0.82; Hemoglobin 13.1; Platelets 268.0; Potassium 5.5 No hemolysis seen; Sodium 140   Recent Lipid Panel    Component Value Date/Time   CHOL 176 11/12/2021 1417   TRIG 93.0 11/12/2021 1417   HDL 61.70 11/12/2021 1417   CHOLHDL 3 11/12/2021 1417   VLDL 18.6 11/12/2021 1417   LDLCALC 95 11/12/2021 1417   LDLCALC  79 08/01/2020 1407   LDLDIRECT 195.7 01/12/2010 0000    Physical Exam:   VS:  BP (!) 150/78 (BP Location: Left Arm, Patient Position: Sitting, Cuff Size: Large)   Pulse 73   Ht '5\' 8"'$  (1.727 m)   Wt 256 lb 9.6 oz (116.4 kg)   SpO2 92%   BMI 39.02 kg/m    Wt Readings from Last 3 Encounters:  11/20/21 256 lb 9.6 oz (116.4 kg)  11/12/21 253 lb (114.8 kg)  09/23/21 255 lb (115.7 kg)    General: Well nourished, well developed, in no acute distress Head: Atraumatic, normal size  Eyes: PEERLA, EOMI  Neck: Supple, no JVD Endocrine: No thryomegaly Cardiac: Normal S1, S2; RRR; 2/6 SEM Lungs: Clear to auscultation bilaterally, no wheezing, rhonchi or rales  Abd: Soft, nontender, no hepatomegaly  Ext: No edema, pulses 2+ Musculoskeletal: No deformities, BUE and BLE strength normal and equal Skin: Warm and dry, no rashes   Neuro: Alert and oriented to person, place, time, and situation, CNII-XII grossly intact, no focal deficits  Psych: Normal mood and affect   ASSESSMENT:   Veronica Solomon is a 66 y.o. female who presents for the following: 1. Murmur   2. Coronary artery calcification seen on CAT scan   3. Mixed hyperlipidemia   4. Primary hypertension     PLAN:   1. Murmur -Faint 2 out of 6 systolic ejection murmur.  Echo in 2015 was unremarkable.  Suspect aortic sclerosis.  Repeat echo.  2. Coronary artery calcification seen on CAT scan 3. Mixed hyperlipidemia -Mild coronary  calcifications.  On a statin.  No symptoms of angina.  Has COPD and shortness of breath.  Would recommend to continue Lipitor 20 mg daily.  I believe an LDL less than 100 is acceptable.  No need for aspirin.  4. Primary hypertension -Elevated today.  Encouraged her to keep an eye on it.  We will not change medications today.  Disposition: Return if symptoms worsen or fail to improve.  Medication Adjustments/Labs and Tests Ordered: Current medicines are reviewed at length with the patient today.  Concerns regarding medicines are outlined above.  Orders Placed This Encounter  Procedures   EKG 12-Lead   ECHOCARDIOGRAM COMPLETE   No orders of the defined types were placed in this encounter.   Patient Instructions  Medication Instructions:  The current medical regimen is effective;  continue present plan and medications.  *If you need a refill on your cardiac medications before your next appointment, please call your pharmacy*   Testing/Procedures: Echocardiogram - Your physician has requested that you have an echocardiogram. Echocardiography is a painless test that uses sound waves to create images of your heart. It provides your doctor with information about the size and shape of your heart and how well your heart's chambers and valves are working. This procedure takes approximately one hour. There are no restrictions for this procedure.     Follow-Up: At [facility], you and your health needs are our priority.  As part of our continuing mission to provide you with exceptional heart care, we have created designated Provider Care Teams.  These Care Teams include your primary Cardiologist (physician) and Advanced Practice Providers (APPs -  Physician Assistants and Nurse Practitioners) who all work together to provide you with the care you need, when you need it.  We recommend signing up for the patient portal called "MyChart".  Sign up information is provided on this After Visit  Summary.  MyChart is used  to connect with patients for Virtual Visits (Telemedicine).  Patients are able to view lab/test results, encounter notes, upcoming appointments, etc.  Non-urgent messages can be sent to your provider as well.   To learn more about what you can do with MyChart, go to [email or web address].    Your next appointment:   As needed  The format for your next appointment:   In Person  Provider:   Eleonore Chiquito, MD         Signed, Addison Naegeli. Audie Box, MD, [place]  [address] [place], [place] [address] [phone]  11/20/2021 1:37 PM

## 2021-11-20 ENCOUNTER — Ambulatory Visit (INDEPENDENT_AMBULATORY_CARE_PROVIDER_SITE_OTHER): Payer: Medicare PPO | Admitting: Cardiovascular Disease

## 2021-11-20 ENCOUNTER — Encounter: Payer: Self-pay | Admitting: Cardiovascular Disease

## 2021-11-20 VITALS — BP 150/78 | HR 73 | Ht 68.0 in | Wt 256.6 lb

## 2021-11-20 DIAGNOSIS — I2510 Atherosclerotic heart disease of native coronary artery without angina pectoris: Secondary | ICD-10-CM | POA: Diagnosis not present

## 2021-11-20 DIAGNOSIS — R011 Cardiac murmur, unspecified: Secondary | ICD-10-CM | POA: Diagnosis not present

## 2021-11-20 DIAGNOSIS — I10 Essential (primary) hypertension: Secondary | ICD-10-CM

## 2021-11-20 DIAGNOSIS — E782 Mixed hyperlipidemia: Secondary | ICD-10-CM

## 2021-11-20 NOTE — Patient Instructions (Signed)
Medication Instructions:  The current medical regimen is effective;  continue present plan and medications.  *If you need a refill on your cardiac medications before your next appointment, please call your pharmacy*   Testing/Procedures: Echocardiogram - Your physician has requested that you have an echocardiogram. Echocardiography is a painless test that uses sound waves to create images of your heart. It provides your doctor with information about the size and shape of your heart and how well your heart's chambers and valves are working. This procedure takes approximately one hour. There are no restrictions for this procedure.     Follow-Up: At [facility], you and your health needs are our priority.  As part of our continuing mission to provide you with exceptional heart care, we have created designated Provider Care Teams.  These Care Teams include your primary Cardiologist (physician) and Advanced Practice Providers (APPs -  Physician Assistants and Nurse Practitioners) who all work together to provide you with the care you need, when you need it.  We recommend signing up for the patient portal called "MyChart".  Sign up information is provided on this After Visit Summary.  MyChart is used to connect with patients for Virtual Visits (Telemedicine).  Patients are able to view lab/test results, encounter notes, upcoming appointments, etc.  Non-urgent messages can be sent to your provider as well.   To learn more about what you can do with MyChart, go to [email or web address].    Your next appointment:   As needed  The format for your next appointment:   In Person  Provider:   [place] O'Neal, MD          

## 2021-11-22 ENCOUNTER — Telehealth: Payer: Medicare PPO | Admitting: Family

## 2021-11-22 DIAGNOSIS — R42 Dizziness and giddiness: Secondary | ICD-10-CM

## 2021-11-22 MED ORDER — MECLIZINE HCL 50 MG PO TABS: 50.0000 mg | ORAL_TABLET | Freq: Three times a day (TID) | ORAL | 0 refills | Status: AC | PRN

## 2021-11-22 NOTE — Progress Notes (Signed)
Virtual Visit Consent   [facility], you are scheduled for a virtual visit with a [place] provider today. Just as with appointments in the office, your consent must be obtained to participate. Your consent will be active for this visit and any virtual visit you may have with one of our providers in the next 365 days. If you have a MyChart account, a copy of this consent can be sent to you electronically.  As this is a virtual visit, video technology does not allow for your provider to perform a traditional examination. This may limit your provider's ability to fully assess your condition. If your provider identifies any concerns that need to be evaluated in person or the need to arrange testing (such as labs, EKG, etc.), we will make arrangements to do so. Although advances in technology are sophisticated, we cannot ensure that it will always work on either your end or our end. If the connection with a video visit is poor, the visit may have to be switched to a telephone visit. With either a video or telephone visit, we are not always able to ensure that we have a secure connection.  By engaging in this virtual visit, you consent to the provision of healthcare and authorize for your insurance to be billed (if applicable) for the services provided during this visit. Depending on your insurance coverage, you may receive a charge related to this service.  I need to obtain your verbal consent now. Are you willing to proceed with your visit today? [facility] has provided verbal consent on 11/22/2021 for a virtual visit (video or telephone). Evelina Dun, FNP  Date: 11/22/2021 6:21 PM  Virtual Visit via Video Note   I, Evelina Dun, connected with  Veronica Solomon  ([id number], 28-Oct-1955) on 11/22/21 at  6:15 PM EDT by a video-enabled telemedicine application and verified that I am speaking with the correct person using two identifiers.  Location: Patient: Virtual Visit  Location Patient: Home Provider: Virtual Visit Location Provider: Home Office   I discussed the limitations of evaluation and management by telemedicine and the availability of in person appointments. The patient expressed understanding and agreed to proceed.    History of Present Illness: Veronica Solomon is a 66 y.o. who identifies as a female who was assigned female at birth, and is being seen today for vertigo that started two days ago. She started  dramamine today that has greatly helped. However, is worried if this would interfere with her current medications.   HPI: Dizziness This is a new problem. The current episode started in the past 7 days. The problem occurs intermittently. The problem has been waxing and waning. Associated symptoms include nausea and weakness. Pertinent negatives include no arthralgias, change in bowel habit, congestion, coughing, diaphoresis or vomiting. The symptoms are aggravated by standing and bending. She has tried relaxation for the symptoms. The treatment provided moderate relief.    Problems:  Patient Active Problem List   Diagnosis Date Noted   Cardiac murmur 08/03/2021   Vitamin D deficiency 04/13/2021   B12 deficiency 04/13/2021   Insect bite of right lower leg 12/18/2020   Chronic bronchitis ([place]) 12/18/2020   COVID-19 11/24/2020   Acute bronchitis with COPD ([place]) 11/04/2020   Sinusitis, bacterial 11/04/2020   Candida vaginitis 11/04/2020   Bronchitis 11/04/2020   Pulmonary emphysema ([place]) 07/11/2020   Current mild episode of major depressive disorder ([place]) 04/26/2019   CAD (coronary artery disease) 03/07/2018   Sensorineural hearing  loss (SNHL), 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   Tinnitus, 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   Chronic swimmer's ear of both sides 02/22/2018   Skin tag 12/27/2016   Chronic daily headache 09/01/2016   Memory loss due to medical condition 06/30/2016   Hyperlipidemia 12/30/2014   Obesity (BMI 30-39.9) 06/25/2013   Numerous moles  11/24/2012   Chronic pain syndrome 09/29/2010   COUGH 12/04/2009   CONSTIPATION 07/18/2009   Myalgia and myositis, unspecified 04/14/2009   DEPRESSION 03/19/2009   PARESTHESIA 03/19/2009   DIARRHEA, INFECTIOUS 03/18/2009   DIARRHEA 03/18/2009   PERSONAL HX COLONIC POLYPS 03/18/2009   Fatigue 03/10/2009   Primary hypertension 02/28/2009   IBS 02/14/2009   POSTMENOPAUSAL STATUS 12/30/2008   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 04/01/2008   HEADACHE 04/01/2008   ACUTE BRONCHITIS 03/07/2008   URI 07/10/2007    Allergies:  Allergies  Allergen Reactions   Paregoric Hives    REACTION: HIVES   Penicillins Rash    REACTION: RASH   Procaine Hcl Other (See Comments)   Medications:  Current Outpatient Medications:    meclizine (ANTIVERT) 50 MG tablet, Take 1 tablet (50 mg total) by mouth 3 (three) times daily as needed., Disp: 30 tablet, Rfl: 0   albuterol (VENTOLIN HFA) 108 (90 Base) MCG/ACT inhaler, Inhale 2 puffs into the lungs every 6 (six) hours as needed for wheezing or shortness of breath., Disp: 18 g, Rfl: 5   amLODipine (NORVASC) 10 MG tablet, TAKE 1 TABLET(10 MG) BY MOUTH DAILY, Disp: 90 tablet, Rfl: 1   atorvastatin (LIPITOR) 20 MG tablet, TAKE 1 TABLET(20 MG) BY MOUTH DAILY, Disp: 90 tablet, Rfl: 1   celecoxib (CELEBREX) 200 MG capsule, Take 1 capsule (200 mg total) by mouth 2 (two) times daily as needed for moderate pain., Disp: 180 capsule, Rfl: 3   cyanocobalamin (,VITAMIN B-12,) 1000 MCG/ML injection, Inject 1 mL (1,000 mcg total) into the muscle every 30 (thirty) days., Disp: 3 mL, Rfl: 1   DULoxetine (CYMBALTA) 60 MG capsule, TAKE 2 CAPSULES BY MOUTH EVERY DAY, Disp: 180 capsule, Rfl: 1   HYDROcodone-acetaminophen (NORCO) 10-325 MG tablet, Take 1 tablet by mouth every 8 (eight) hours as needed., Disp: 30 tablet, Rfl: 0   pregabalin (LYRICA) 75 MG capsule, TAKE 1 CAPSULE(75 MG) BY MOUTH TWICE DAILY, Disp: 180 capsule, Rfl: 2   SYRINGE-NEEDLE, DISP, 3 ML (LUER  LOCK SAFETY SYRINGES) 25G X 5/8" 3 ML MISC, Use as directed once a month to give b12 injection, Disp: 10 each, Rfl: 1   traMADol (ULTRAM) 50 MG tablet, TAKE 1 TABLET(50 MG) BY MOUTH EVERY 6 HOURS AS NEEDED, Disp: 90 tablet, Rfl: 0   TRELEGY ELLIPTA 100-62.5-25 MCG/ACT AEPB, INHALE 1 PUFF INTO THE LUNGS DAILY, Disp: 60 each, Rfl: 1   Vitamin D, Ergocalciferol, (DRISDOL) 1.25 MG (50000 UNIT) CAPS capsule, TAKE 1 CAPSULE BY MOUTH EVERY 7 DAYS, Disp: 12 capsule, Rfl: 1  Observations/Objective: Patient is well-developed, well-nourished in no acute distress.  Resting comfortably  at home.  Head is normocephalic, atraumatic.  No labored breathing.  Speech is clear and coherent with logical content.  Patient is alert and oriented at baseline.    Assessment and Plan: 1. Vertigo - meclizine (ANTIVERT) 50 MG tablet; Take 1 tablet (50 mg total) by mouth 3 (three) times daily as needed.  Dispense: 30 tablet; Refill: 0  Force fluids E[place] exercises discussed  Fall precautions discussed  Continue Antivert TID  Follow up if symptoms worsen or do not improve 2  Follow Up  Instructions: I discussed the assessment and treatment plan with the patient. The patient was provided an opportunity to ask questions and all were answered. The patient agreed with the plan and demonstrated an understanding of the instructions.  A copy of instructions were sent to the patient via MyChart unless otherwise noted below.    The patient was advised to call back or seek an in-person evaluation if the symptoms worsen or if the condition fails to improve as anticipated.  Time:  I spent 7 minutes with the patient via telehealth technology discussing the above problems/concerns.    Evelina Dun, FNP

## 2021-11-22 NOTE — Patient Instructions (Signed)
Vertigo Vertigo is the feeling that you or your surroundings are moving when they are not. This feeling can come and go at any time. Vertigo often goes away on its own. Vertigo can be dangerous if it occurs while you are doing something that could endanger yourself or others, such as driving or operating machinery. Your health care provider will do tests to try to determine the cause of your vertigo. Tests will also help your health care provider decide how best to treat your condition. Follow these instructions at home: Eating and drinking     Dehydration can make vertigo worse. Drink enough fluid to keep your urine pale yellow. Do not drink alcohol. Activity Return to your normal activities as told by your health care provider. Ask your health care provider what activities are safe for you. In the morning, first sit up on the side of the bed. When you feel okay, stand slowly while you hold onto something until you know that your balance is fine. Move slowly. Avoid sudden body or head movements or certain positions, as told by your health care provider. If you have trouble walking or keeping your balance, try using a cane for stability. If you feel dizzy or unstable, sit down right away. Avoid doing any tasks that would cause danger to you or others if vertigo occurs. Avoid bending down if you feel dizzy. Place items in your home so that they are easy for you to reach without bending or leaning over. Do not drive or use machinery if you feel dizzy. General instructions Take over-the-counter and prescription medicines only as told by your health care provider. Keep all follow-up visits. This is important. Contact a health care provider if: Your medicines do not relieve your vertigo or they make it worse. Your condition gets worse or you develop new symptoms. You have a fever. You develop nausea or vomiting, or if nausea gets worse. Your family or friends notice any behavioral changes. You  have numbness or a prickling and tingling sensation in part of your body. Get help right away if you: Are always dizzy or you faint. Develop severe headaches. Develop a stiff neck. Develop sensitivity to light. Have difficulty moving or speaking. Have weakness in your hands, arms, or legs. Have changes in your hearing or vision. These symptoms may represent a serious problem that is an emergency. Do not wait to see if the symptoms will go away. Get medical help right away. Call your local emergency services (911 in the [place] Do not drive yourself to the hospital. Summary Vertigo is the feeling that you or your surroundings are moving when they are not. Your health care provider will do tests to try to determine the cause of your vertigo. Follow instructions for home care. You may be told to avoid certain tasks, positions, or movements. Contact a health care provider if your medicines do not relieve your symptoms, or if you have a fever, nausea, vomiting, or changes in behavior. Get help right away if you have severe headaches or difficulty speaking, or you develop hearing or vision problems. This information is not intended to replace advice given to you by your health care provider. Make sure you discuss any questions you have with your health care provider. Document Revised: 04/30/2020 Document Reviewed: 04/30/2020 Elsevier Patient Education  2023 Elsevier Inc.  

## 2021-11-25 ENCOUNTER — Other Ambulatory Visit: Payer: Self-pay | Admitting: Family Medicine

## 2021-11-25 ENCOUNTER — Other Ambulatory Visit: Payer: Self-pay

## 2021-11-25 DIAGNOSIS — M9905 Segmental and somatic dysfunction of pelvic region: Secondary | ICD-10-CM | POA: Diagnosis not present

## 2021-11-25 DIAGNOSIS — M5431 Sciatica, right side: Secondary | ICD-10-CM | POA: Diagnosis not present

## 2021-11-25 DIAGNOSIS — M5136 Other intervertebral disc degeneration, lumbar region: Secondary | ICD-10-CM | POA: Diagnosis not present

## 2021-11-25 DIAGNOSIS — M9902 Segmental and somatic dysfunction of thoracic region: Secondary | ICD-10-CM | POA: Diagnosis not present

## 2021-11-25 DIAGNOSIS — E875 Hyperkalemia: Secondary | ICD-10-CM

## 2021-11-25 DIAGNOSIS — M9903 Segmental and somatic dysfunction of lumbar region: Secondary | ICD-10-CM | POA: Diagnosis not present

## 2021-11-25 DIAGNOSIS — G894 Chronic pain syndrome: Secondary | ICD-10-CM

## 2021-11-25 DIAGNOSIS — M5134 Other intervertebral disc degeneration, thoracic region: Secondary | ICD-10-CM | POA: Diagnosis not present

## 2021-11-25 NOTE — Telephone Encounter (Signed)
Requesting: Lyrica '75mg'$   Contract: 08/03/21 UDS: 08/03/21 Last Visit: 11/12/21 Next Visit: 05/14/22 Last Refill: 02/24/21 #180 and 0RF  Please Advise

## 2021-11-27 ENCOUNTER — Other Ambulatory Visit: Payer: Self-pay | Admitting: Family Medicine

## 2021-11-27 DIAGNOSIS — I10 Essential (primary) hypertension: Secondary | ICD-10-CM

## 2021-11-30 ENCOUNTER — Other Ambulatory Visit (INDEPENDENT_AMBULATORY_CARE_PROVIDER_SITE_OTHER): Payer: Medicare PPO

## 2021-11-30 DIAGNOSIS — E875 Hyperkalemia: Secondary | ICD-10-CM | POA: Diagnosis not present

## 2021-11-30 LAB — BASIC METABOLIC PANEL
BUN: 20 mg/dL (ref 6–23)
CO2: 30 mEq/L (ref 19–32)
Calcium: 9.8 mg/dL (ref 8.4–10.5)
Chloride: 104 mEq/L (ref 96–112)
Creatinine, Ser: 1 mg/dL (ref 0.40–1.20)
GFR: 59.24 mL/min — ABNORMAL LOW (ref >60.00)
Glucose, Bld: 101 mg/dL — ABNORMAL HIGH (ref 70–99)
Potassium: 5.3 mEq/L — ABNORMAL HIGH (ref 3.5–5.1)
Sodium: 139 mEq/L (ref 135–145)

## 2021-12-02 ENCOUNTER — Encounter: Payer: Self-pay | Admitting: Family Medicine

## 2021-12-04 ENCOUNTER — Other Ambulatory Visit: Payer: Self-pay

## 2021-12-04 ENCOUNTER — Ambulatory Visit (HOSPITAL_BASED_OUTPATIENT_CLINIC_OR_DEPARTMENT_OTHER): Payer: Medicare PPO | Attending: Student | Admitting: Pulmonary Disease

## 2021-12-04 DIAGNOSIS — G4733 Obstructive sleep apnea (adult) (pediatric): Secondary | ICD-10-CM | POA: Diagnosis not present

## 2021-12-04 DIAGNOSIS — E875 Hyperkalemia: Secondary | ICD-10-CM

## 2021-12-07 DIAGNOSIS — J9621 Acute and chronic respiratory failure with hypoxia: Secondary | ICD-10-CM | POA: Diagnosis not present

## 2021-12-09 DIAGNOSIS — M5136 Other intervertebral disc degeneration, lumbar region: Secondary | ICD-10-CM | POA: Diagnosis not present

## 2021-12-09 DIAGNOSIS — M5431 Sciatica, right side: Secondary | ICD-10-CM | POA: Diagnosis not present

## 2021-12-09 DIAGNOSIS — M5134 Other intervertebral disc degeneration, thoracic region: Secondary | ICD-10-CM | POA: Diagnosis not present

## 2021-12-09 DIAGNOSIS — M9905 Segmental and somatic dysfunction of pelvic region: Secondary | ICD-10-CM | POA: Diagnosis not present

## 2021-12-09 DIAGNOSIS — M9903 Segmental and somatic dysfunction of lumbar region: Secondary | ICD-10-CM | POA: Diagnosis not present

## 2021-12-09 DIAGNOSIS — M9902 Segmental and somatic dysfunction of thoracic region: Secondary | ICD-10-CM | POA: Diagnosis not present

## 2021-12-10 DIAGNOSIS — G4733 Obstructive sleep apnea (adult) (pediatric): Secondary | ICD-10-CM

## 2021-12-10 NOTE — Procedures (Signed)
Patient Name: Veronica Solomon, Veronica Solomon Date: 12/04/2021 Gender: Female D.O.B: [birth year] Age (years): 43 Referring Provider: Maryjane Hurter Height (inches): 68 Interpreting Physician: Kara Mead MD, ABSM Weight (lbs): 250 RPSGT: Baxter Flattery BMI: 38 MRN: [id number] Neck Size: 16.00 <br> <br> CLINICAL INFORMATION The patient is referred for a PAP titration to treat sleep apnea.    Date of  HST: 07/2021 ,AHI 23/h with lowest desaturation 72%  SLEEP STUDY TECHNIQUE As per the AASM Manual for the Scoring of Sleep and Associated Events v2.3 (April 2016) with a hypopnea requiring 4% desaturations.  The channels recorded and monitored were frontal, central and occipital EEG, electrooculogram (EOG), submentalis EMG (chin), nasal and oral airflow, thoracic and abdominal wall motion, anterior tibialis EMG, snore microphone, electrocardiogram, and pulse oximetry. Bilevel positive airway pressure (BPAP) was initiated at the beginning of the study and titrated to treat sleep-disordered breathing.  MEDICATIONS Medications self-administered by patient taken the night of the study : N/A  RESPIRATORY PARAMETERS Optimal IPAP Pressure (cm): 17 AHI at Optimal Pressure (/hr) 0 Optimal EPAP Pressure (cm): 13   Overall Minimal O2 (%): 73.0 Minimal O2 at Optimal Pressure (%): 83.0 SLEEP ARCHITECTURE Start Time: 10:44:55 PM Stop Time: 4:40:19 AM Total Time (min): 355.4 Total Sleep Time (min): 274.7 Sleep Latency (min): 8.2 Sleep Efficiency (%): 77.3% REM Latency (min): 203.0 WASO (min): 72.5 Stage N1 (%): 0.7% Stage N2 (%): 72.1% Stage N3 (%): 0.0% Stage R (%): 27.1 Supine (%): 63.85 Arousal Index (/hr): 1.7     CARDIAC DATA The 2 lead EKG demonstrated sinus rhythm. The mean heart rate was 73.4 beats per minute. Other EKG findings include: Atrial Fibrillation.   LEG MOVEMENT DATA The total Periodic Limb Movements of Sleep (PLMS) were 0. The PLMS index was 0.0. A PLMS index of <15 is considered  normal in adults.  IMPRESSIONS - An optimal PAP pressure was selected for this patient ( 17 / 13cm of water). She could not tolerate higher pressure on CPAP - Central sleep apnea was not noted during this titration (CAI = 1.3/h). - Severe oxygen desaturations were observed during this titration (min O2 = 73.0%). - No snoring was audible during this study. - 2-lead EKG demonstrated: Atrial Fibrillation - Clinically significant periodic limb movements were not noted during this study. Arousals associated with PLMs were rare.   DIAGNOSIS - Obstructive Sleep Apnea (G47.33)   RECOMMENDATIONS - Trial of BiPAP therapy on 17/13 cm H2O with a Medium size Resmed Full Face AirFit F20 mask and heated humidification. Alternatively, auto BiPAP can be used with EPAP min 8, PS +4, IPAP max 17 cm - Avoid alcohol, sedatives and other CNS depressants that may worsen sleep apnea and disrupt normal sleep architecture. - Sleep hygiene should be reviewed to assess factors that may improve sleep quality. - Weight management and regular exercise should be initiated or continued. - Return to Sleep Center for re-evaluation after 4 weeks of therapy   Kara Mead MD Board Certified in [place]

## 2021-12-16 ENCOUNTER — Ambulatory Visit (HOSPITAL_COMMUNITY): Payer: Medicare PPO | Attending: Cardiology

## 2021-12-16 DIAGNOSIS — R011 Cardiac murmur, unspecified: Secondary | ICD-10-CM | POA: Insufficient documentation

## 2021-12-16 LAB — ECHOCARDIOGRAM COMPLETE
AV Mean grad: 12 mmHg
AV Peak grad: 20.1 mmHg
Ao pk vel: 2.24 m/s
Area-P 1/2: 2.28 cm2
S' Lateral: 2.7 cm

## 2021-12-18 ENCOUNTER — Other Ambulatory Visit: Payer: Self-pay

## 2021-12-18 DIAGNOSIS — R931 Abnormal findings on diagnostic imaging of heart and coronary circulation: Secondary | ICD-10-CM

## 2021-12-22 ENCOUNTER — Encounter: Payer: Self-pay | Admitting: Family Medicine

## 2021-12-24 ENCOUNTER — Other Ambulatory Visit: Payer: Self-pay | Admitting: Family Medicine

## 2021-12-24 DIAGNOSIS — R931 Abnormal findings on diagnostic imaging of heart and coronary circulation: Secondary | ICD-10-CM

## 2021-12-27 ENCOUNTER — Other Ambulatory Visit: Payer: Self-pay | Admitting: Family Medicine

## 2021-12-27 DIAGNOSIS — G894 Chronic pain syndrome: Secondary | ICD-10-CM

## 2021-12-30 DIAGNOSIS — M9903 Segmental and somatic dysfunction of lumbar region: Secondary | ICD-10-CM | POA: Diagnosis not present

## 2021-12-30 DIAGNOSIS — M5136 Other intervertebral disc degeneration, lumbar region: Secondary | ICD-10-CM | POA: Diagnosis not present

## 2021-12-30 DIAGNOSIS — M9905 Segmental and somatic dysfunction of pelvic region: Secondary | ICD-10-CM | POA: Diagnosis not present

## 2021-12-30 DIAGNOSIS — M5431 Sciatica, right side: Secondary | ICD-10-CM | POA: Diagnosis not present

## 2021-12-30 DIAGNOSIS — M9902 Segmental and somatic dysfunction of thoracic region: Secondary | ICD-10-CM | POA: Diagnosis not present

## 2021-12-30 DIAGNOSIS — M5134 Other intervertebral disc degeneration, thoracic region: Secondary | ICD-10-CM | POA: Diagnosis not present

## 2022-01-02 ENCOUNTER — Telehealth: Payer: Medicare PPO | Admitting: Nurse Practitioner

## 2022-01-02 DIAGNOSIS — J40 Bronchitis, not specified as acute or chronic: Secondary | ICD-10-CM | POA: Diagnosis not present

## 2022-01-02 MED ORDER — DOXYCYCLINE HYCLATE 100 MG PO TABS: 100.0000 mg | ORAL_TABLET | Freq: Two times a day (BID) | ORAL | 0 refills | Status: DC

## 2022-01-02 MED ORDER — PROMETHAZINE-DM 6.25-15 MG/5ML PO SYRP: 5.0000 mL | ORAL_SOLUTION | Freq: Four times a day (QID) | ORAL | 0 refills | Status: DC | PRN

## 2022-01-02 NOTE — Progress Notes (Signed)
Virtual Visit Consent   [facility], you are scheduled for a virtual visit with Mary-Margaret Hassell Done, [place], a [place] provider, today.     Just as with appointments in the office, your consent must be obtained to participate.  Your consent will be active for this visit and any virtual visit you may have with one of our providers in the next 365 days.     If you have a MyChart account, a copy of this consent can be sent to you electronically.  All virtual visits are billed to your insurance company just like a traditional visit in the office.    As this is a virtual visit, video technology does not allow for your provider to perform a traditional examination.  This may limit your provider's ability to fully assess your condition.  If your provider identifies any concerns that need to be evaluated in person or the need to arrange testing (such as labs, EKG, etc.), we will make arrangements to do so.     Although advances in technology are sophisticated, we cannot ensure that it will always work on either your end or our end.  If the connection with a video visit is poor, the visit may have to be switched to a telephone visit.  With either a video or telephone visit, we are not always able to ensure that we have a secure connection.     I need to obtain your verbal consent now.   Are you willing to proceed with your visit today? YES   [facility] has provided verbal consent on 01/02/2022 for a virtual visit (video or telephone).   Mary-Margaret Hassell Done, FNP   Date: 01/02/2022 3:22 PM   Virtual Visit via Video Note   I, Mary-Margaret Hassell Done, connected with [name], [birth year]) on 01/02/22 at  3:30 PM EDT by a video-enabled telemedicine application and verified that I am speaking with the correct person using two identifiers.  Location: Patient: Virtual Visit Location Patient: Home Provider: Virtual Visit Location Provider: Mobile   I discussed  the limitations of evaluation and management by telemedicine and the availability of in person appointments. The patient expressed understanding and agreed to proceed.    History of Present Illness: Veronica Solomon is a 66 y.o. who identifies as a female who was assigned female at birth, and is being seen today for cough.  HPI: Cough This is a new problem. The current episode started in the past 7 days (wednesday of last week). The problem has been gradually worsening. The problem occurs constantly. The cough is Non-productive. Associated symptoms include chills, a fever, nasal congestion, postnasal drip, a sore throat and shortness of breath. Nothing aggravates the symptoms. She has tried OTC cough suppressant for the symptoms. The treatment provided mild relief. Her past medical history is significant for bronchitis and emphysema.   * she is currently on trelogy Review of Systems  Constitutional:  Positive for chills and fever.  HENT:  Positive for postnasal drip and sore throat.   Respiratory:  Positive for cough and shortness of breath.     Problems:  Patient Active Problem List   Diagnosis Date Noted   Cardiac murmur 08/03/2021   Vitamin D deficiency 04/13/2021   B12 deficiency 04/13/2021   Insect bite of right lower leg 12/18/2020   Chronic bronchitis ([place]) 12/18/2020   COVID-19 11/24/2020   Acute bronchitis with COPD ([place]) 11/04/2020   Sinusitis, bacterial 11/04/2020   Candida vaginitis 11/04/2020  Bronchitis 11/04/2020   Pulmonary emphysema ([place]) 07/11/2020   Current mild episode of major depressive disorder ([place]) 04/26/2019   CAD (coronary artery disease) 03/07/2018   Sensorineural hearing loss (SNHL), 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   Tinnitus, 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   Chronic swimmer's ear of both sides 02/22/2018   Skin tag 12/27/2016   Chronic daily headache 09/01/2016   Memory loss due to medical condition 06/30/2016   Hyperlipidemia 12/30/2014   Obesity (BMI 30-39.9)  06/25/2013   Numerous moles 11/24/2012   Chronic pain syndrome 09/29/2010   COUGH 12/04/2009   CONSTIPATION 07/18/2009   Myalgia and myositis, unspecified 04/14/2009   DEPRESSION 03/19/2009   PARESTHESIA 03/19/2009   DIARRHEA, INFECTIOUS 03/18/2009   DIARRHEA 03/18/2009   PERSONAL HX COLONIC POLYPS 03/18/2009   Fatigue 03/10/2009   Primary hypertension 02/28/2009   IBS 02/14/2009   POSTMENOPAUSAL STATUS 12/30/2008   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 04/01/2008   HEADACHE 04/01/2008   ACUTE BRONCHITIS 03/07/2008   URI 07/10/2007    Allergies:  Allergies  Allergen Reactions   Paregoric Hives    REACTION: HIVES   Penicillins Rash    REACTION: RASH   Procaine Hcl Other (See Comments)   Medications:  Current Outpatient Medications:    albuterol (VENTOLIN HFA) 108 (90 Base) MCG/ACT inhaler, Inhale 2 puffs into the lungs every 6 (six) hours as needed for wheezing or shortness of breath., Disp: 18 g, Rfl: 5   amLODipine (NORVASC) 10 MG tablet, TAKE 1 TABLET(10 MG) BY MOUTH DAILY, Disp: 90 tablet, Rfl: 1   atorvastatin (LIPITOR) 20 MG tablet, TAKE 1 TABLET(20 MG) BY MOUTH DAILY, Disp: 90 tablet, Rfl: 1   celecoxib (CELEBREX) 200 MG capsule, Take 1 capsule (200 mg total) by mouth 2 (two) times daily as needed for moderate pain., Disp: 180 capsule, Rfl: 3   cyanocobalamin (,VITAMIN B-12,) 1000 MCG/ML injection, Inject 1 mL (1,000 mcg total) into the muscle every 30 (thirty) days., Disp: 3 mL, Rfl: 1   DULoxetine (CYMBALTA) 60 MG capsule, TAKE 2 CAPSULES BY MOUTH EVERY DAY, Disp: 180 capsule, Rfl: 1   HYDROcodone-acetaminophen (NORCO) 10-325 MG tablet, Take 1 tablet by mouth every 8 (eight) hours as needed., Disp: 30 tablet, Rfl: 0   meclizine (ANTIVERT) 50 MG tablet, Take 1 tablet (50 mg total) by mouth 3 (three) times daily as needed., Disp: 30 tablet, Rfl: 0   pregabalin (LYRICA) 75 MG capsule, TAKE 1 CAPSULE(75 MG) BY MOUTH TWICE DAILY, Disp: 180 capsule, Rfl: 1    SYRINGE-NEEDLE, DISP, 3 ML (LUER LOCK SAFETY SYRINGES) 25G X 5/8" 3 ML MISC, Use as directed once a month to give b12 injection, Disp: 10 each, Rfl: 1   traMADol (ULTRAM) 50 MG tablet, TAKE 1 TABLET(50 MG) BY MOUTH EVERY 6 HOURS AS NEEDED, Disp: 90 tablet, Rfl: 0   TRELEGY ELLIPTA 100-62.5-25 MCG/ACT AEPB, INHALE 1 PUFF INTO THE LUNGS DAILY, Disp: 60 each, Rfl: 1   Vitamin D, Ergocalciferol, (DRISDOL) 1.25 MG (50000 UNIT) CAPS capsule, TAKE 1 CAPSULE BY MOUTH EVERY 7 DAYS, Disp: 12 capsule, Rfl: 1  Observations/Objective: Patient is well-developed, well-nourished in no acute distress.  Resting comfortably  at home.  Head is normocephalic, atraumatic.  No labored breathing.  Speech is clear and coherent with logical content.  Patient is alert and oriented at baseline.  Deep dry cough noted  Assessment and Plan:  [facility] in today with chief complaint of Cough   1. Bronchitis 1. Take meds as prescribed 2. Use a  cool mist humidifier especially during the winter months and when heat has been humid. 3. Use saline nose sprays frequently 4. Saline irrigations of the nose can be very helpful if done frequently.  * 4X daily for 1 week*  * Use of a nettie pot can be helpful with this. Follow directions with this* 5. Drink plenty of fluids 6. Keep thermostat turn down low 7.For any cough or congestion- promethazine DM 8. For fever or aces or pains- take tylenol or ibuprofen appropriate for age and weight.  * for fevers greater than 101 orally you may alternate ibuprofen and tylenol every  3 hours.   Continue inhalers as prescribed  Meds ordered this encounter  Medications   doxycycline (VIBRA-TABS) 100 MG tablet    Sig: Take 1 tablet (100 mg total) by mouth 2 (two) times daily. 1 po bid    Dispense:  20 tablet    Refill:  0    Order Specific Question:   Supervising Provider    Answer:   Noemi Chapel [[id number]]   promethazine-dextromethorphan (PROMETHAZINE-DM) 6.25-15 MG/5ML  syrup    Sig: Take 5 mLs by mouth 4 (four) times daily as needed for cough.    Dispense:  118 mL    Refill:  0    Order Specific Question:   Supervising Provider    Answer:   Noemi Chapel [[id number]]      Follow Up Instructions: I discussed the assessment and treatment plan with the patient. The patient was provided an opportunity to ask questions and all were answered. The patient agreed with the plan and demonstrated an understanding of the instructions.  A copy of instructions were sent to the patient via MyChart.  The patient was advised to call back or seek an in-person evaluation if the symptoms worsen or if the condition fails to improve as anticipated.  Time:  I spent 6 minutes with the patient via telehealth technology discussing the above problems/concerns.    Mary-Margaret Hassell Done, FNP

## 2022-01-02 NOTE — Patient Instructions (Signed)

## 2022-01-04 ENCOUNTER — Other Ambulatory Visit: Payer: Medicare PPO

## 2022-01-06 DIAGNOSIS — J9621 Acute and chronic respiratory failure with hypoxia: Secondary | ICD-10-CM | POA: Diagnosis not present

## 2022-01-11 ENCOUNTER — Other Ambulatory Visit (INDEPENDENT_AMBULATORY_CARE_PROVIDER_SITE_OTHER): Payer: Medicare PPO

## 2022-01-11 DIAGNOSIS — E875 Hyperkalemia: Secondary | ICD-10-CM | POA: Diagnosis not present

## 2022-01-11 LAB — BASIC METABOLIC PANEL
BUN: 16 mg/dL (ref 6–23)
CO2: 29 mEq/L (ref 19–32)
Calcium: 9.8 mg/dL (ref 8.4–10.5)
Chloride: 104 mEq/L (ref 96–112)
Creatinine, Ser: 0.74 mg/dL (ref 0.40–1.20)
GFR: 84.95 mL/min (ref >60.00)
Glucose, Bld: 99 mg/dL (ref 70–99)
Potassium: 4.5 mEq/L (ref 3.5–5.1)
Sodium: 141 mEq/L (ref 135–145)

## 2022-01-14 ENCOUNTER — Ambulatory Visit
Admission: RE | Admit: 2022-01-14 | Discharge: 2022-01-14 | Disposition: A | Discharge status: Home / Self Care | Payer: Medicare PPO | Source: Ambulatory Visit | Attending: Family Medicine | Admitting: Family Medicine

## 2022-01-14 DIAGNOSIS — M8589 Other specified disorders of bone density and structure, multiple sites: Secondary | ICD-10-CM | POA: Diagnosis not present

## 2022-01-14 DIAGNOSIS — E2839 Other primary ovarian failure: Secondary | ICD-10-CM

## 2022-01-14 DIAGNOSIS — Z780 Asymptomatic menopausal state: Secondary | ICD-10-CM | POA: Diagnosis not present

## 2022-01-18 ENCOUNTER — Ambulatory Visit (HOSPITAL_COMMUNITY)
Admission: EM | Admit: 2022-01-18 | Discharge: 2022-01-18 | Disposition: A | Discharge status: Home / Self Care | Payer: Medicare PPO | Attending: Physician Assistant | Admitting: Physician Assistant

## 2022-01-18 ENCOUNTER — Telehealth: Payer: Medicare PPO | Admitting: Physician Assistant

## 2022-01-18 ENCOUNTER — Encounter (HOSPITAL_COMMUNITY): Payer: Self-pay

## 2022-01-18 ENCOUNTER — Ambulatory Visit (INDEPENDENT_AMBULATORY_CARE_PROVIDER_SITE_OTHER): Payer: Medicare PPO

## 2022-01-18 DIAGNOSIS — J441 Chronic obstructive pulmonary disease with (acute) exacerbation: Secondary | ICD-10-CM

## 2022-01-18 DIAGNOSIS — J208 Acute bronchitis due to other specified organisms: Secondary | ICD-10-CM

## 2022-01-18 DIAGNOSIS — R0602 Shortness of breath: Secondary | ICD-10-CM | POA: Diagnosis not present

## 2022-01-18 MED ORDER — AZITHROMYCIN 250 MG PO TABS: 250.0000 mg | ORAL_TABLET | Freq: Every day | ORAL | 0 refills | Status: DC

## 2022-01-18 MED ORDER — IPRATROPIUM-ALBUTEROL 0.5-2.5 (3) MG/3ML IN SOLN
3.0000 mL | Freq: Once | RESPIRATORY_TRACT | Status: AC
  Administered 2022-01-18: 3 mL via RESPIRATORY_TRACT

## 2022-01-18 MED ORDER — IPRATROPIUM-ALBUTEROL 0.5-2.5 (3) MG/3ML IN SOLN
RESPIRATORY_TRACT | Status: AC
  Filled 2022-01-18: qty 3

## 2022-01-18 MED ORDER — PREDNISONE 10 MG (21) PO TBPK: ORAL_TABLET | Freq: Every day | ORAL | 0 refills | Status: DC

## 2022-01-18 NOTE — Discharge Instructions (Addendum)
Go to ED with worsening symptoms Follow up with PCP Use albuterol ever 4 hours for coughing, wheezing

## 2022-01-18 NOTE — Progress Notes (Signed)
Because you have failed first-line treatments, I feel your condition warrants further evaluation and I recommend that you be seen in a face to face visit. You are considered higher risk to develop pneumonia and should be evaluated for such in person.    NOTE: There will be NO CHARGE for this eVisit   If you are having a true medical emergency please call 911.      For an urgent face to face visit, [facility] has seven urgent care centers for your convenience:     [facility] [place] Get Driving Directions [phone] [place], [place] [address]    [facility]) Get Driving Directions [phone] [place], [place] [address]  [facility] ([place]) Get Driving Directions [phone] [address] [place],  [place]  [address]  [facility] at [address] Get Driving Directions  [phone] [address] [place],  [place] [address]   [facility] Get Driving Directions [phone] [address], [place], [place] [address]   [facility] Get Driving Directions  [phone] [address] [place], [place] [address]   [facility] [place] Get Driving Directions [phone] [address], [place], [place] [address]  Your MyChart E-visit questionnaire answers were reviewed by a board certified advanced clinical practitioner to complete your personal care plan based on your specific symptoms.  Thank you for using e-Visits.    I provided 5 minutes of non face-to-face time during this encounter for chart review and documentation.

## 2022-01-18 NOTE — ED Provider Notes (Signed)
Veronica Solomon    CSN: [id number] Arrival date & time: 01/18/22  1728      History   Chief Complaint Chief Complaint  Patient presents with   Cough   Shortness of Breath    Pt is here for a cough and shortness of breath . Pt has been diagnosed with COPD    HPI [facility] [place] is a 66 y.o. female.   Patient here c/w SOB x several days. She has history of COPD, uses O2 at night, not during the day.   She has had a cough x 10 days, treated with doxycycline.  She states she finished doxycycline and cough worsened.  She reprots fever at home.  No n/v/d, abdominal pain.      Past Medical History:  Diagnosis Date   Adenomatous colon polyp    Allergy    C. difficile colitis    Diverticulosis    Hyperlipidemia    Hypertension    IBS (irritable bowel syndrome)    Nasal congestion 06/14/1998   Neuromuscular disorder [facility])     Patient Active Problem List   Diagnosis Date Noted   Cardiac murmur 08/03/2021   Vitamin D deficiency 04/13/2021   B12 deficiency 04/13/2021   Insect bite of right lower leg 12/18/2020   Chronic bronchitis ([place]) 12/18/2020   COVID-19 11/24/2020   Acute bronchitis with COPD ([place]) 11/04/2020   Sinusitis, bacterial 11/04/2020   Candida vaginitis 11/04/2020   Bronchitis 11/04/2020   Pulmonary emphysema ([place]) 07/11/2020   Current mild episode of major depressive disorder ([place]) 04/26/2019   CAD (coronary artery disease) 03/07/2018   Sensorineural hearing loss (SNHL), 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   Tinnitus, 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   Chronic swimmer's ear of both sides 02/22/2018   Skin tag 12/27/2016   Chronic daily headache 09/01/2016   Memory loss due to medical condition 06/30/2016   Hyperlipidemia 12/30/2014   Obesity (BMI 30-39.9) 06/25/2013   Numerous moles 11/24/2012   Chronic pain syndrome 09/29/2010   COUGH 12/04/2009   CONSTIPATION 07/18/2009   Myalgia and myositis, unspecified 04/14/2009   DEPRESSION 03/19/2009   PARESTHESIA  03/19/2009   DIARRHEA, INFECTIOUS 03/18/2009   DIARRHEA 03/18/2009   PERSONAL HX COLONIC POLYPS 03/18/2009   Fatigue 03/10/2009   Primary hypertension 02/28/2009   IBS 02/14/2009   POSTMENOPAUSAL STATUS 12/30/2008   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 04/01/2008   HEADACHE 04/01/2008   ACUTE BRONCHITIS 03/07/2008   URI 07/10/2007    Past Surgical History:  Procedure Laterality Date   ABDOMINAL HYSTERECTOMY  2002   fibroids    OB History   No obstetric history on file.      Home Medications    Prior to Admission medications   Medication Sig Start Date End Date Taking? Authorizing Provider  azithromycin (ZITHROMAX) 250 MG tablet Take 1 tablet (250 mg total) by mouth daily. Take first 2 tablets together, then 1 every day until finished. 01/18/22  Yes Peri Jefferson, PA-C  predniSONE (STERAPRED UNI-PAK 21 TAB) 10 MG (21) TBPK tablet Take by mouth daily. Take as directed on packaging 01/18/22  Yes Peri Jefferson, PA-C  albuterol (VENTOLIN HFA) 108 (90 Base) MCG/ACT inhaler Inhale 2 puffs into the lungs every 6 (six) hours as needed for wheezing or shortness of breath. 11/27/20   Roma Schanz R, DO  amLODipine (NORVASC) 10 MG tablet TAKE 1 TABLET(10 MG) BY MOUTH DAILY 10/26/21   Wendling, Crosby Oyster, DO  atorvastatin (LIPITOR) 20 MG tablet TAKE 1 TABLET(20 MG)  BY MOUTH DAILY 09/01/21   Roma Schanz R, DO  celecoxib (CELEBREX) 200 MG capsule Take 1 capsule (200 mg total) by mouth 2 (two) times daily as needed for moderate pain. 03/19/21   [name], DO  doxycycline (VIBRA-TABS) 100 MG tablet Take 1 tablet (100 mg total) by mouth 2 (two) times daily. 1 po bid 01/02/22   Chevis Pretty, FNP  DULoxetine (CYMBALTA) 60 MG capsule TAKE 2 CAPSULES BY MOUTH EVERY DAY 09/01/21   Carollee Herter, Alferd Apa, DO  HYDROcodone-acetaminophen (NORCO) 10-325 MG tablet Take 1 tablet by mouth every 8 (eight) hours as needed. 10/14/21   [name], DO   meclizine (ANTIVERT) 50 MG tablet Take 1 tablet (50 mg total) by mouth 3 (three) times daily as needed. 11/22/21   Sharion Balloon, FNP  pregabalin (LYRICA) 75 MG capsule TAKE 1 CAPSULE(75 MG) BY MOUTH TWICE DAILY 11/26/21   Carollee Herter, Alferd Apa, DO  promethazine-dextromethorphan (PROMETHAZINE-DM) 6.25-15 MG/5ML syrup Take 5 mLs by mouth 4 (four) times daily as needed for cough. 01/02/22   Chevis Pretty, FNP  traMADol (ULTRAM) 50 MG tablet TAKE 1 TABLET(50 MG) BY MOUTH EVERY 6 HOURS AS NEEDED 12/28/21   Carollee Herter, Yvonne R, DO  TRELEGY ELLIPTA 100-62.5-25 MCG/ACT AEPB INHALE 1 PUFF INTO THE LUNGS DAILY 08/25/21   Carollee Herter, Alferd Apa, DO  Vitamin D, Ergocalciferol, (DRISDOL) 1.25 MG (50000 UNIT) CAPS capsule TAKE 1 CAPSULE BY MOUTH EVERY 7 DAYS 09/21/21   [name], DO    Family History Family History  Problem Relation Age of Onset   Hypertension Mother    Depression Mother    Heart attack Mother    Uterine cancer Mother    Diabetes Maternal Grandmother    Hypertension Paternal Grandmother     Social History Social History   Tobacco Use   Smoking status: Former    Packs/day: 1.00    Years: 42.00    Total pack years: 42.00    Types: Cigarettes    Quit date: 12/29/2011    Years since quitting: 10.0   Smokeless tobacco: Former   Tobacco comments:    Quit 12/2011  Vaping Use   Vaping Use: Never used  Substance Use Topics   Alcohol use: Yes    Comment: rarely   Drug use: No     Allergies   Paregoric, Penicillins, and Procaine hcl   Review of Systems Review of Systems  Constitutional:  Positive for chills and fever. Negative for fatigue.  HENT:  Negative for congestion, ear pain, nosebleeds, postnasal drip, rhinorrhea, sinus pressure, sinus pain and sore throat.   Eyes:  Negative for pain and redness.  Respiratory:  Positive for cough and shortness of breath. Negative for wheezing.   Gastrointestinal:  Negative for abdominal pain, diarrhea, nausea and  vomiting.  Musculoskeletal:  Negative for arthralgias and myalgias.  Skin:  Negative for rash.  Neurological:  Negative for light-headedness and headaches.  Hematological:  Negative for adenopathy. Does not bruise/bleed easily.  Psychiatric/Behavioral:  Negative for confusion and sleep disturbance.      Physical Exam Triage Vital Signs ED Triage Vitals  Enc Vitals Group     BP 01/18/22 1814 137/78     Pulse Rate 01/18/22 1814 83     Resp --      Temp 01/18/22 1814 98.3 F (36.8 C)     Temp Source 01/18/22 1814 Oral     SpO2 01/18/22 1814 (!) 89 %  Weight 01/18/22 1821 250 lb (113.4 kg)     Height 01/18/22 1821 '5\' 8"'$  (1.727 m)     Head Circumference --      Peak Flow --      Pain Score 01/18/22 1821 8     Pain Loc --      Pain Edu? --      Excl. in [place]? --    No data found.  Updated Vital Signs BP 137/78 (BP Location: Left Arm)   Pulse 83   Temp 98.3 F (36.8 C) (Oral)   Ht '5\' 8"'$  (1.727 m)   Wt 250 lb (113.4 kg)   SpO2 (!) 89%   BMI 38.01 kg/m   Visual Acuity Right Eye Distance:   Left Eye Distance:   Bilateral Distance:    Right Eye Near:   Left Eye Near:    Bilateral Near:     Physical Exam Vitals and nursing note reviewed.  Constitutional:      General: She is not in acute distress.    Appearance: Normal appearance. She is not ill-appearing.  HENT:     Head: Normocephalic and atraumatic.     Right Ear: Tympanic membrane and ear canal normal.     Left Ear: Tympanic membrane and ear canal normal.     Nose: No congestion or rhinorrhea.     Mouth/Throat:     Pharynx: No oropharyngeal exudate or posterior oropharyngeal erythema.  Eyes:     General: No scleral icterus.    Extraocular Movements: Extraocular movements intact.     Conjunctiva/sclera: Conjunctivae normal.  Cardiovascular:     Rate and Rhythm: Normal rate and regular rhythm.     Heart sounds: No murmur heard. Pulmonary:     Effort: Pulmonary effort is normal. No respiratory distress.      Breath sounds: Wheezing and rhonchi present. No rales.     Comments: B/l, throughout Musculoskeletal:     Cervical back: Normal range of motion. No rigidity.  Skin:    Capillary Refill: Capillary refill takes less than 2 seconds.     Coloration: Skin is not jaundiced.     Findings: No rash.  Neurological:     General: No focal deficit present.     Mental Status: She is alert and oriented to person, place, and time.     Motor: No weakness.     Gait: Gait normal.  Psychiatric:        Mood and Affect: Mood normal.        Behavior: Behavior normal.      UC Treatments / Results  Labs (all labs ordered are listed, but only abnormal results are displayed) Labs Reviewed - No data to display  EKG   Radiology DG Chest 2 View  Result Date: 01/18/2022 CLINICAL DATA:  Shortness of breath EXAM: CHEST - 2 VIEW COMPARISON:  06/25/2020 FINDINGS: The heart size and mediastinal contours are within normal limits. Both lungs are clear. The visualized skeletal structures are unremarkable. IMPRESSION: No active cardiopulmonary disease. Electronically Signed   By: Randa Ngo M.D.   On: 01/18/2022 18:47    Procedures Procedures (including critical care time)  Medications Ordered in UC Medications  ipratropium-albuterol (DUONEB) 0.5-2.5 (3) MG/3ML nebulizer solution 3 mL (3 mLs Nebulization Given 01/18/22 1936)    Initial Impression / Assessment and Plan / UC Course  I have reviewed the triage vital signs and the nursing notes.  Pertinent labs & imaging results that were available during my care of the  patient were reviewed by me and considered in my medical decision making (see chart for details).     Strict ED precautions provided CXR without acute lung infection Likely COPD exacerbation Patient reports improvement after duoneb treatment O2 after treatment 92%, chart review notes O2 stable around 92%. Final Clinical Impressions(s) / UC Diagnoses   Final diagnoses:  COPD  exacerbation ([place])     Discharge Instructions      Go to ED with worsening symptoms Follow up with PCP Use albuterol ever 4 hours for coughing, wheezing     ED Prescriptions     Medication Sig Dispense Auth. Provider   azithromycin (ZITHROMAX) 250 MG tablet Take 1 tablet (250 mg total) by mouth daily. Take first 2 tablets together, then 1 every day until finished. 6 tablet Peri Jefferson, PA-C   predniSONE (STERAPRED UNI-PAK 21 TAB) 10 MG (21) TBPK tablet Take by mouth daily. Take as directed on packaging 21 tablet Peri Jefferson, PA-C      PDMP not reviewed this encounter.   Peri Jefferson, PA-C 01/18/22 1954

## 2022-01-18 NOTE — ED Triage Notes (Signed)
Pt complains of shortness of breath and cough x1day. Pt has COPD. Pt was recently treated for bronchitis. Pt states when medications was done she started to feel bad again

## 2022-01-21 ENCOUNTER — Ambulatory Visit: Payer: Medicare PPO | Admitting: Family Medicine

## 2022-01-27 DIAGNOSIS — M5136 Other intervertebral disc degeneration, lumbar region: Secondary | ICD-10-CM | POA: Diagnosis not present

## 2022-01-27 DIAGNOSIS — M9902 Segmental and somatic dysfunction of thoracic region: Secondary | ICD-10-CM | POA: Diagnosis not present

## 2022-01-27 DIAGNOSIS — M5431 Sciatica, right side: Secondary | ICD-10-CM | POA: Diagnosis not present

## 2022-01-27 DIAGNOSIS — M5134 Other intervertebral disc degeneration, thoracic region: Secondary | ICD-10-CM | POA: Diagnosis not present

## 2022-01-27 DIAGNOSIS — M9903 Segmental and somatic dysfunction of lumbar region: Secondary | ICD-10-CM | POA: Diagnosis not present

## 2022-01-27 DIAGNOSIS — M9905 Segmental and somatic dysfunction of pelvic region: Secondary | ICD-10-CM | POA: Diagnosis not present

## 2022-02-01 NOTE — Progress Notes (Deleted)
Synopsis: Referred for chronic bronchitis by [name], *  Subjective:   PATIENT ID: Veronica Solomon GENDER: female DOB: 06/26/1955, MRN: [id number]  No chief complaint on file.  [name] with history of smoking who is referred for chronic bronchitis who is enrolled in lung cancer screening. Has been told that she has COPD - if she has done PFTs it's been many years. Had covid twice - in July/August. Was not hospitalized but did get course of paxlovid and steroids. This is only course of steroids she's ever received and it's hard for her to recall effect that it had on her breathing.   She has DOE over last several years. Can walk on level ground half mile to a mile without issue but with incline or increased speed her exercise tolerance has worsened. She has no orthopnea. Weight has increased 30 lb over last couple of years. She has never had a blood clot. Never had asthma as a kid. Does get some exertional chest tightness, gets better when she stops.   She is taking trelegy over last 3-4 months, rinses mouth after use. She thinks she's had only minor improvement. Only minor improvement with albuterol as well.  No family history of cancer. Did have family with emphysema, COPD  Smoked for 42y. Pack per day. Quit in 2013. 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 Has lived in [place], moved to [place] in 1980. Apart from living in [place] but has had no exposure to dusts/solvents/particulates without a mask.   Interval HPI; Being worked up for LVOT obstruction with cMR, not sure if there was also suggestion of PFO on echo.   Treated for AECOPD at [facility] urgent care with azithromycin and prednisone (had finished ocurse of doxy before that)  Otherwise pertinent review of systems is negative.  Past Medical History:  Diagnosis Date   Adenomatous colon polyp    Allergy    C. difficile colitis    Diverticulosis    Hyperlipidemia    Hypertension    IBS  (irritable bowel syndrome)    Nasal congestion 06/14/1998   Neuromuscular disorder ([place])      Family History  Problem Relation Age of Onset   Hypertension Mother    Depression Mother    Heart attack Mother    Uterine cancer Mother    Diabetes Maternal Grandmother    Hypertension Paternal Grandmother      Past Surgical History:  Procedure Laterality Date   ABDOMINAL HYSTERECTOMY  2002   fibroids    Social History   Socioeconomic History   Marital status: Single    Spouse name: Not on file   Number of children: Not on file   Years of education: Not on file   Highest education level: Not on file  Occupational History   Occupation: self employed -- Herbalist  Tobacco Use   Smoking status: Former    Packs/day: 1.00    Years: 42.00    Total pack years: 42.00    Types: Cigarettes    Quit date: 12/29/2011    Years since quitting: 10.1   Smokeless tobacco: Former   Tobacco comments:    Quit 12/2011  Vaping Use   Vaping Use: Never used  Substance and Sexual Activity   Alcohol use: Yes    Comment: rarely   Drug use: No   Sexual activity: Not Currently  Other Topics Concern   Not on file  Social History Narrative  Exercise -- training dogs    Social Determinants of Health   Financial Resource Strain: Low Risk  (02/11/2021)   Overall Financial Resource Strain (CARDIA)    Difficulty of Paying Living Expenses: Not hard at all  Food Insecurity: No Food Insecurity (02/11/2021)   Hunger Vital Sign    Worried About Running Out of Food in the Last Year: Never true    Ran Out of Food in the Last Year: Never true  Transportation Needs: No Transportation Needs (02/11/2021)   PRAPARE - Hydrologist (Medical): No    Lack of Transportation (Non-Medical): No  Physical Activity: Insufficiently Active (02/11/2021)   Exercise Vital Sign    Days of Exercise per Week: 7 days    Minutes of Exercise per Session: 20 min  Stress: Stress Concern Present  (02/11/2021)   [place]    Feeling of Stress : To some extent  Social Connections: Socially Isolated (02/11/2021)   Social Connection and Isolation Panel [NHANES]    Frequency of Communication with Friends and Family: More than three times a week    Frequency of Social Gatherings with Friends and Family: More than three times a week    Attends Religious Services: Never    Marine scientist or Organizations: No    Attends Archivist Meetings: Never    Marital Status: Never married  Intimate Partner Violence: Not At Risk (02/11/2021)   Humiliation, Afraid, Rape, and Kick questionnaire    Fear of Current or Ex-Partner: No    Emotionally Abused: No    Physically Abused: No    Sexually Abused: No     Allergies  Allergen Reactions   Paregoric Hives    REACTION: HIVES   Penicillins Rash    REACTION: RASH   Procaine Hcl Other (See Comments)     Outpatient Medications Prior to Visit  Medication Sig Dispense Refill   albuterol (VENTOLIN HFA) 108 (90 Base) MCG/ACT inhaler Inhale 2 puffs into the lungs every 6 (six) hours as needed for wheezing or shortness of breath. 18 g 5   amLODipine (NORVASC) 10 MG tablet TAKE 1 TABLET(10 MG) BY MOUTH DAILY 90 tablet 1   atorvastatin (LIPITOR) 20 MG tablet TAKE 1 TABLET(20 MG) BY MOUTH DAILY 90 tablet 1   azithromycin (ZITHROMAX) 250 MG tablet Take 1 tablet (250 mg total) by mouth daily. Take first 2 tablets together, then 1 every day until finished. 6 tablet 0   celecoxib (CELEBREX) 200 MG capsule Take 1 capsule (200 mg total) by mouth 2 (two) times daily as needed for moderate pain. 180 capsule 3   doxycycline (VIBRA-TABS) 100 MG tablet Take 1 tablet (100 mg total) by mouth 2 (two) times daily. 1 po bid 20 tablet 0   DULoxetine (CYMBALTA) 60 MG capsule TAKE 2 CAPSULES BY MOUTH EVERY DAY 180 capsule 1   HYDROcodone-acetaminophen (NORCO) 10-325 MG tablet Take 1 tablet by  mouth every 8 (eight) hours as needed. 30 tablet 0   meclizine (ANTIVERT) 50 MG tablet Take 1 tablet (50 mg total) by mouth 3 (three) times daily as needed. 30 tablet 0   predniSONE (STERAPRED UNI-PAK 21 TAB) 10 MG (21) TBPK tablet Take by mouth daily. Take as directed on packaging 21 tablet 0   pregabalin (LYRICA) 75 MG capsule TAKE 1 CAPSULE(75 MG) BY MOUTH TWICE DAILY 180 capsule 1   promethazine-dextromethorphan (PROMETHAZINE-DM) 6.25-15 MG/5ML syrup Take 5 mLs by mouth  4 (four) times daily as needed for cough. 118 mL 0   traMADol (ULTRAM) 50 MG tablet TAKE 1 TABLET(50 MG) BY MOUTH EVERY 6 HOURS AS NEEDED 90 tablet 0   TRELEGY ELLIPTA 100-62.5-25 MCG/ACT AEPB INHALE 1 PUFF INTO THE LUNGS DAILY 60 each 1   Vitamin D, Ergocalciferol, (DRISDOL) 1.25 MG (50000 UNIT) CAPS capsule TAKE 1 CAPSULE BY MOUTH EVERY 7 DAYS 12 capsule 1   No facility-administered medications prior to visit.       Objective:   Physical Exam:  General appearance: 66 y.o., female, NAD, conversant  Eyes: anicteric sclerae; PERRL, tracking appropriately HENT: NCAT; MMM Neck: Trachea midline; no lymphadenopathy, no JVD Lungs: CTAB, no crackles, no wheeze, with normal respiratory effort CV: RRR, murmur heard at last visit is harder to appreciate today Abdomen: Soft, non-tender; non-distended, BS present  Extremities: No peripheral edema, warm Skin: Normal turgor and texture; no rash Psych: Appropriate affect Neuro: Alert and oriented to person and place, no focal deficit     There were no vitals filed for this visit.      on RA BMI Readings from Last 3 Encounters:  01/18/22 38.01 kg/m  12/04/21 38.01 kg/m  11/20/21 39.02 kg/m   Wt Readings from Last 3 Encounters:  01/18/22 250 lb (113.4 kg)  12/04/21 250 lb (113.4 kg)  11/20/21 256 lb 9.6 oz (116.4 kg)     CBC    Component Value Date/Time   WBC 9.3 11/12/2021 1417   RBC 4.53 11/12/2021 1417   HGB 13.1 11/12/2021 1417   HCT 39.7 11/12/2021  1417   PLT 268.0 11/12/2021 1417   MCV 87.6 11/12/2021 1417   MCHC 33.1 11/12/2021 1417   RDW 13.2 11/12/2021 1417   LYMPHSABS 2.4 11/12/2021 1417   MONOABS 0.6 11/12/2021 1417   EOSABS 0.2 11/12/2021 1417   BASOSABS 0.1 11/12/2021 1417      Chest Imaging: CT Chest 07/02/20 reviewed by me remarkable for multiple stable bilateral ggo/subsolid nodules, emphysema, mild bronchial wall thickening  CT Chest 07/30/21 reviewed by me - mild emphysema and stable small nodules  Pulmonary Functions Testing Results:    Latest Ref Rng & Units 07/29/2021    1:00 PM  PFT Results  FVC-Pre L 2.70   FVC-Predicted Pre % 74   FVC-Post L 2.73   FVC-Predicted Post % 75   Pre FEV1/FVC % % 76   Post FEV1/FCV % % 77   FEV1-Pre L 2.05   FEV1-Predicted Pre % 73   FEV1-Post L 2.10   DLCO uncorrected ml/min/mmHg 19.76   DLCO UNC% % 88   DLCO corrected ml/min/mmHg 19.76   DLCO COR %Predicted % 88   DLVA Predicted % 107   TLC L 5.37   TLC % Predicted % 96   RV % Predicted % 100    Reviewed by me with mild obstruction  Home PSG with AHI of 23, nadir O2 saturation of 72%, average of 89%  CPAP titration with optimal PAP 17/13  Echocardiogram:   TTE 2015: G1DD      Assessment & Plan:   # DOE # Emphysema  # Bronchial wall thickening # COPD Gold functional group B # Moderate OSA not on CPAP May be multifactorial with COPD only recently started on trelegy, newly recognized sleep disordered breathing, deconditioning following covid-19 infection.  # Multiple pulmonary nodules: stable subsolid, followed in lung cancer screening program.  # Chronic hypoxemic respiratory failure: On 2L [place] with exertion and sleep. Seems to be out  of proportion to her PFTs.   Plan: - start O2 2L [place] with exertion and sleep  - continue low dose trelegy - CPAP titration ordered - She'll let us know if she can find an affordable site for TTE - continues in lung cancer screening program   RTC 4 months to review  CPAP titration results   Maryjane Hurter, MD [facility] 02/01/2022 5:27 PM

## 2022-02-03 ENCOUNTER — Ambulatory Visit: Payer: Medicare PPO | Admitting: Student

## 2022-02-06 DIAGNOSIS — J9621 Acute and chronic respiratory failure with hypoxia: Secondary | ICD-10-CM | POA: Diagnosis not present

## 2022-02-17 DIAGNOSIS — M9905 Segmental and somatic dysfunction of pelvic region: Secondary | ICD-10-CM | POA: Diagnosis not present

## 2022-02-17 DIAGNOSIS — M9903 Segmental and somatic dysfunction of lumbar region: Secondary | ICD-10-CM | POA: Diagnosis not present

## 2022-02-17 DIAGNOSIS — M9902 Segmental and somatic dysfunction of thoracic region: Secondary | ICD-10-CM | POA: Diagnosis not present

## 2022-02-17 DIAGNOSIS — M5431 Sciatica, right side: Secondary | ICD-10-CM | POA: Diagnosis not present

## 2022-02-17 DIAGNOSIS — M5134 Other intervertebral disc degeneration, thoracic region: Secondary | ICD-10-CM | POA: Diagnosis not present

## 2022-02-17 DIAGNOSIS — M5136 Other intervertebral disc degeneration, lumbar region: Secondary | ICD-10-CM | POA: Diagnosis not present

## 2022-02-19 NOTE — Progress Notes (Unsigned)
Synopsis: Referred for chronic bronchitis by [name], *  Subjective:   PATIENT ID: Veronica Solomon GENDER: female DOB: Dec 13, 1955, MRN: [id number]  No chief complaint on file.  19yF with history of smoking who is referred for chronic bronchitis who is enrolled in lung cancer screening. Has been told that she has COPD - if she has done PFTs it's been many years. Had covid twice - in July/August. Was not hospitalized but did get course of paxlovid and steroids. This is only course of steroids she's ever received and it's hard for her to recall effect that it had on her breathing.   She has DOE over last several years. Can walk on level ground half mile to a mile without issue but with incline or increased speed her exercise tolerance has worsened. She has no orthopnea. Weight has increased 30 lb over last couple of years. She has never had a blood clot. Never had asthma as a kid. Does get some exertional chest tightness, gets better when she stops.   She is taking trelegy over last 3-4 months, rinses mouth after use. She thinks she's had only minor improvement. Only minor improvement with albuterol as well.  No family history of cancer. Did have family with emphysema, COPD  Smoked for 42y. Pack per day. Quit in 2013. 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 Has lived in [place], moved to [place] in 1980. Apart from living in [place] but has had no exposure to dusts/solvents/particulates without a mask.   Interval HPI; Being worked up for LVOT obstruction with cMR, not sure if there was also suggestion of PFO on echo.   Treated for AECOPD at [facility] urgent care with azithromycin and prednisone (had finished ocurse of doxy before that)  Titrated to 17/13 BiPAP for OSA  Otherwise pertinent review of systems is negative.  Past Medical History:  Diagnosis Date   Adenomatous colon polyp    Allergy    C. difficile colitis    Diverticulosis     Hyperlipidemia    Hypertension    IBS (irritable bowel syndrome)    Nasal congestion 06/14/1998   Neuromuscular disorder ([place])      Family History  Problem Relation Age of Onset   Hypertension Mother    Depression Mother    Heart attack Mother    Uterine cancer Mother    Diabetes Maternal Grandmother    Hypertension Paternal Grandmother      Past Surgical History:  Procedure Laterality Date   ABDOMINAL HYSTERECTOMY  2002   fibroids    Social History   Socioeconomic History   Marital status: Single    Spouse name: Not on file   Number of children: Not on file   Years of education: Not on file   Highest education level: Not on file  Occupational History   Occupation: self employed -- Herbalist  Tobacco Use   Smoking status: Former    Packs/day: 1.00    Years: 42.00    Total pack years: 42.00    Types: Cigarettes    Quit date: 12/29/2011    Years since quitting: 10.1   Smokeless tobacco: Former   Tobacco comments:    Quit 12/2011  Vaping Use   Vaping Use: Never used  Substance and Sexual Activity   Alcohol use: Yes    Comment: rarely   Drug use: No   Sexual activity: Not Currently  Other Topics Concern   Not on  file  Social History Narrative   Exercise -- training dogs    Social Determinants of Health   Financial Resource Strain: Low Risk  (02/11/2021)   Overall Financial Resource Strain (CARDIA)    Difficulty of Paying Living Expenses: Not hard at all  Food Insecurity: No Food Insecurity (02/11/2021)   Hunger Vital Sign    Worried About Running Out of Food in the Last Year: Never true    Ran Out of Food in the Last Year: Never true  Transportation Needs: No Transportation Needs (02/11/2021)   PRAPARE - Hydrologist (Medical): No    Lack of Transportation (Non-Medical): No  Physical Activity: Insufficiently Active (02/11/2021)   Exercise Vital Sign    Days of Exercise per Week: 7 days    Minutes of Exercise per Session: 20  min  Stress: Stress Concern Present (02/11/2021)   [place]    Feeling of Stress : To some extent  Social Connections: Socially Isolated (02/11/2021)   Social Connection and Isolation Panel [NHANES]    Frequency of Communication with Friends and Family: More than three times a week    Frequency of Social Gatherings with Friends and Family: More than three times a week    Attends Religious Services: Never    Marine scientist or Organizations: No    Attends Archivist Meetings: Never    Marital Status: Never married  Intimate Partner Violence: Not At Risk (02/11/2021)   Humiliation, Afraid, Rape, and Kick questionnaire    Fear of Current or Ex-Partner: No    Emotionally Abused: No    Physically Abused: No    Sexually Abused: No     Allergies  Allergen Reactions   Paregoric Hives    REACTION: HIVES   Penicillins Rash    REACTION: RASH   Procaine Hcl Other (See Comments)     Outpatient Medications Prior to Visit  Medication Sig Dispense Refill   albuterol (VENTOLIN HFA) 108 (90 Base) MCG/ACT inhaler Inhale 2 puffs into the lungs every 6 (six) hours as needed for wheezing or shortness of breath. 18 g 5   amLODipine (NORVASC) 10 MG tablet TAKE 1 TABLET(10 MG) BY MOUTH DAILY 90 tablet 1   atorvastatin (LIPITOR) 20 MG tablet TAKE 1 TABLET(20 MG) BY MOUTH DAILY 90 tablet 1   azithromycin (ZITHROMAX) 250 MG tablet Take 1 tablet (250 mg total) by mouth daily. Take first 2 tablets together, then 1 every day until finished. 6 tablet 0   celecoxib (CELEBREX) 200 MG capsule Take 1 capsule (200 mg total) by mouth 2 (two) times daily as needed for moderate pain. 180 capsule 3   doxycycline (VIBRA-TABS) 100 MG tablet Take 1 tablet (100 mg total) by mouth 2 (two) times daily. 1 po bid 20 tablet 0   DULoxetine (CYMBALTA) 60 MG capsule TAKE 2 CAPSULES BY MOUTH EVERY DAY 180 capsule 1   HYDROcodone-acetaminophen (NORCO)  10-325 MG tablet Take 1 tablet by mouth every 8 (eight) hours as needed. 30 tablet 0   meclizine (ANTIVERT) 50 MG tablet Take 1 tablet (50 mg total) by mouth 3 (three) times daily as needed. 30 tablet 0   predniSONE (STERAPRED UNI-PAK 21 TAB) 10 MG (21) TBPK tablet Take by mouth daily. Take as directed on packaging 21 tablet 0   pregabalin (LYRICA) 75 MG capsule TAKE 1 CAPSULE(75 MG) BY MOUTH TWICE DAILY 180 capsule 1   promethazine-dextromethorphan (PROMETHAZINE-DM) 6.25-15  MG/5ML syrup Take 5 mLs by mouth 4 (four) times daily as needed for cough. 118 mL 0   traMADol (ULTRAM) 50 MG tablet TAKE 1 TABLET(50 MG) BY MOUTH EVERY 6 HOURS AS NEEDED 90 tablet 0   TRELEGY ELLIPTA 100-62.5-25 MCG/ACT AEPB INHALE 1 PUFF INTO THE LUNGS DAILY 60 each 1   Vitamin D, Ergocalciferol, (DRISDOL) 1.25 MG (50000 UNIT) CAPS capsule TAKE 1 CAPSULE BY MOUTH EVERY 7 DAYS 12 capsule 1   No facility-administered medications prior to visit.       Objective:   Physical Exam:  General appearance: 66 y.o., female, NAD, conversant  Eyes: anicteric sclerae; PERRL, tracking appropriately HENT: NCAT; MMM Neck: Trachea midline; no lymphadenopathy, no JVD Lungs: CTAB, no crackles, no wheeze, with normal respiratory effort CV: RRR, murmur heard at last visit is harder to appreciate today Abdomen: Soft, non-tender; non-distended, BS present  Extremities: No peripheral edema, warm Skin: Normal turgor and texture; no rash Psych: Appropriate affect Neuro: Alert and oriented to person and place, no focal deficit     There were no vitals filed for this visit.      on RA BMI Readings from Last 3 Encounters:  01/18/22 38.01 kg/m  12/04/21 38.01 kg/m  11/20/21 39.02 kg/m   Wt Readings from Last 3 Encounters:  01/18/22 250 lb (113.4 kg)  12/04/21 250 lb (113.4 kg)  11/20/21 256 lb 9.6 oz (116.4 kg)     CBC    Component Value Date/Time   WBC 9.3 11/12/2021 1417   RBC 4.53 11/12/2021 1417   HGB 13.1  11/12/2021 1417   HCT 39.7 11/12/2021 1417   PLT 268.0 11/12/2021 1417   MCV 87.6 11/12/2021 1417   MCHC 33.1 11/12/2021 1417   RDW 13.2 11/12/2021 1417   LYMPHSABS 2.4 11/12/2021 1417   MONOABS 0.6 11/12/2021 1417   EOSABS 0.2 11/12/2021 1417   BASOSABS 0.1 11/12/2021 1417      Chest Imaging: CT Chest 07/02/20 reviewed by me remarkable for multiple stable bilateral ggo/subsolid nodules, emphysema, mild bronchial wall thickening  CT Chest 07/30/21 reviewed by me - mild emphysema and stable small nodules  Pulmonary Functions Testing Results:    Latest Ref Rng & Units 07/29/2021    1:00 PM  PFT Results  FVC-Pre L 2.70   FVC-Predicted Pre % 74   FVC-Post L 2.73   FVC-Predicted Post % 75   Pre FEV1/FVC % % 76   Post FEV1/FCV % % 77   FEV1-Pre L 2.05   FEV1-Predicted Pre % 73   FEV1-Post L 2.10   DLCO uncorrected ml/min/mmHg 19.76   DLCO UNC% % 88   DLCO corrected ml/min/mmHg 19.76   DLCO COR %Predicted % 88   DLVA Predicted % 107   TLC L 5.37   TLC % Predicted % 96   RV % Predicted % 100    Reviewed by me with mild obstruction  Home PSG with AHI of 23, nadir O2 saturation of 72%, average of 89%  CPAP titration with optimal PAP 17/13  Echocardiogram:   TTE 2015: G1DD  TTE 12/16/21:  1. Left ventricular ejection fraction, by estimation, is 60 to 65%. The  left ventricle has normal function. The left ventricle has no regional  wall motion abnormalities. Moderate focal basal septal left ventricular  hypertrophy. There was a dynamic LV  outflow tract gradient reaching 80 mmHg peak with valsalva. Left  ventricular diastolic parameters are consistent with Grade I diastolic  dysfunction (impaired relaxation).   2.  Right ventricular systolic function is normal. The right ventricular  size is normal. There is normal pulmonary artery systolic pressure. The  estimated right ventricular systolic pressure is 77.8 mmHg.   3. Left atrial size was mildly dilated.   4. Cannot  rule out PFO.   5. The mitral valve is normal in structure, no systolic anterior motion  noted. Mild mitral valve regurgitation. No evidence of mitral stenosis.   6. The aortic valve was not well visualized. Aortic valve regurgitation  is not visualized. No definite aortic stenosis is present.   7. The inferior vena cava is normal in size with greater than 50%  respiratory variability, suggesting right atrial pressure of 3 mmHg.   8. Possible hypertrophic cardiomyopathy variant.      Assessment & Plan:   # DOE # Emphysema  # Bronchial wall thickening # COPD Gold functional group B # Moderate OSA not on CPAP May be multifactorial with COPD only recently started on trelegy, newly recognized sleep disordered breathing, deconditioning following covid-19 infection.  # Multiple pulmonary nodules: stable subsolid, followed in lung cancer screening program.  # Chronic hypoxemic respiratory failure: On 2L [place] with exertion and sleep. Seems to be out of proportion to her PFTs.   Plan: - start O2 2L [place] with exertion and sleep  - continue low dose trelegy - CPAP titration ordered - She'll let us know if she can find an affordable site for TTE - continues in lung cancer screening program   RTC 4 months to review CPAP titration results   Maryjane Hurter, MD [facility] 02/19/2022 8:34 PM

## 2022-02-20 ENCOUNTER — Other Ambulatory Visit: Payer: Self-pay | Admitting: Family Medicine

## 2022-02-22 ENCOUNTER — Encounter: Payer: Self-pay | Admitting: Student

## 2022-02-22 ENCOUNTER — Ambulatory Visit: Payer: Medicare PPO | Admitting: Student

## 2022-02-22 VITALS — BP 126/78 | HR 86 | Temp 97.7°F | Ht 68.0 in | Wt 247.4 lb

## 2022-02-22 DIAGNOSIS — G4733 Obstructive sleep apnea (adult) (pediatric): Secondary | ICD-10-CM

## 2022-02-22 DIAGNOSIS — J9611 Chronic respiratory failure with hypoxia: Secondary | ICD-10-CM | POA: Diagnosis not present

## 2022-02-22 DIAGNOSIS — J439 Emphysema, unspecified: Secondary | ICD-10-CM | POA: Diagnosis not present

## 2022-02-22 MED ORDER — TRELEGY ELLIPTA 200-62.5-25 MCG/ACT IN AEPB: 1.0000 | INHALATION_SPRAY | Freq: Every day | RESPIRATORY_TRACT | 0 refills | Status: DC

## 2022-02-22 NOTE — Patient Instructions (Signed)
-   start auto BiPAP, can stop oxygen at night, don't need home concentrator but would keep portable concentrator for now  - stay on trelegy 1 puff once daily, rinse mouth afterward - see you in 3 months

## 2022-02-25 ENCOUNTER — Telehealth: Payer: Self-pay | Admitting: Family Medicine

## 2022-02-25 NOTE — Telephone Encounter (Signed)
Left message for patient to call back and schedule Medicare Annual Wellness Visit (AWV).   Please offer to do virtually or by telephone.  Left office number and my jabber [phone].  Last AWV:02/11/2021  Please schedule at anytime with Nurse Health Advisor.

## 2022-03-09 ENCOUNTER — Telehealth (HOSPITAL_COMMUNITY): Payer: Self-pay | Admitting: Emergency Medicine

## 2022-03-09 NOTE — Telephone Encounter (Signed)
Attempted to call patient regarding upcoming cardiac MR appointment. Left message on voicemail with name and callback number Dusan Lipford RN Navigator Cardiac [facility] [phone] Office [phone] Cell  

## 2022-03-10 ENCOUNTER — Ambulatory Visit (HOSPITAL_COMMUNITY)
Admission: RE | Admit: 2022-03-10 | Discharge: 2022-03-10 | Disposition: A | Discharge status: Home / Self Care | Payer: Medicare PPO | Source: Ambulatory Visit | Attending: Cardiovascular Disease | Admitting: Cardiovascular Disease

## 2022-03-10 ENCOUNTER — Other Ambulatory Visit: Payer: Self-pay | Admitting: Cardiovascular Disease

## 2022-03-10 DIAGNOSIS — I3139 Other pericardial effusion (noninflammatory): Secondary | ICD-10-CM | POA: Diagnosis not present

## 2022-03-10 DIAGNOSIS — M5136 Other intervertebral disc degeneration, lumbar region: Secondary | ICD-10-CM | POA: Diagnosis not present

## 2022-03-10 DIAGNOSIS — M9905 Segmental and somatic dysfunction of pelvic region: Secondary | ICD-10-CM | POA: Diagnosis not present

## 2022-03-10 DIAGNOSIS — R931 Abnormal findings on diagnostic imaging of heart and coronary circulation: Secondary | ICD-10-CM | POA: Diagnosis not present

## 2022-03-10 DIAGNOSIS — M5431 Sciatica, right side: Secondary | ICD-10-CM | POA: Diagnosis not present

## 2022-03-10 DIAGNOSIS — M9902 Segmental and somatic dysfunction of thoracic region: Secondary | ICD-10-CM | POA: Diagnosis not present

## 2022-03-10 DIAGNOSIS — M5134 Other intervertebral disc degeneration, thoracic region: Secondary | ICD-10-CM | POA: Diagnosis not present

## 2022-03-10 DIAGNOSIS — M9903 Segmental and somatic dysfunction of lumbar region: Secondary | ICD-10-CM | POA: Diagnosis not present

## 2022-03-10 MED ORDER — GADOBUTROL 1 MMOL/ML IV SOLN
10.0000 mL | Freq: Once | INTRAVENOUS | Status: AC | PRN
  Administered 2022-03-10: 10 mL via INTRAVENOUS

## 2022-03-24 ENCOUNTER — Other Ambulatory Visit: Payer: Self-pay | Admitting: *Deleted

## 2022-03-24 MED ORDER — METOPROLOL TARTRATE 25 MG PO TABS: 25.0000 mg | ORAL_TABLET | Freq: Two times a day (BID) | ORAL | 3 refills | Status: DC

## 2022-03-25 ENCOUNTER — Encounter: Payer: Self-pay | Admitting: Student

## 2022-03-25 NOTE — Telephone Encounter (Signed)
Dr. Verlee Monte, please see pt's message regarding what strength of Trelegy she is to be on. Pt states she was originally started on Trelegy 100 but at the last OV in 02/2022 pt was given a Trelegy 200 sample. It wasn't mentioned in the last OV note to change to Trelegy 200. Just want to clarify, is pt to be on Trelegy 100 or 200 strength? Thank you!

## 2022-03-29 ENCOUNTER — Telehealth: Payer: Self-pay | Admitting: Cardiovascular Disease

## 2022-03-29 MED ORDER — TRELEGY ELLIPTA 200-62.5-25 MCG/ACT IN AEPB: 1.0000 | INHALATION_SPRAY | Freq: Every day | RESPIRATORY_TRACT | 11 refills | Status: DC

## 2022-03-29 NOTE — Telephone Encounter (Signed)
Called patient, LVM to call back.  Left call back number.   

## 2022-03-29 NOTE — Addendum Note (Signed)
Addended by: Maryjane Hurter on: 03/29/2022 02:39 PM   Modules accepted: Orders

## 2022-03-29 NOTE — Telephone Encounter (Signed)
Patient calling in bout her results. Please advise 

## 2022-03-29 NOTE — Telephone Encounter (Signed)
Spoke to patient stated she was calling back to schedule appointment with Dr.O'Neal to go over test results.Advised I don't see any appointments.I will send message to Dr.O'Neal's nurse to schedule.

## 2022-03-30 NOTE — Telephone Encounter (Signed)
Spoke with patient, scheduled for 11/03.  Patient verbalized understanding.

## 2022-04-14 DIAGNOSIS — M5134 Other intervertebral disc degeneration, thoracic region: Secondary | ICD-10-CM | POA: Diagnosis not present

## 2022-04-14 DIAGNOSIS — M5136 Other intervertebral disc degeneration, lumbar region: Secondary | ICD-10-CM | POA: Diagnosis not present

## 2022-04-14 DIAGNOSIS — M9905 Segmental and somatic dysfunction of pelvic region: Secondary | ICD-10-CM | POA: Diagnosis not present

## 2022-04-14 DIAGNOSIS — M9903 Segmental and somatic dysfunction of lumbar region: Secondary | ICD-10-CM | POA: Diagnosis not present

## 2022-04-14 DIAGNOSIS — M5431 Sciatica, right side: Secondary | ICD-10-CM | POA: Diagnosis not present

## 2022-04-14 DIAGNOSIS — M9902 Segmental and somatic dysfunction of thoracic region: Secondary | ICD-10-CM | POA: Diagnosis not present

## 2022-04-15 NOTE — Progress Notes (Signed)
Cardiology Office Note:   Date:  04/16/2022  NAME:  Veronica Solomon    MRN: [id number] DOB:  November 29, 1955   PCP:  [name], DO  Cardiologist:  None  Electrophysiologist:  None   Referring MD: Carollee Herter, Alferd Apa, *   Chief Complaint  Patient presents with   Follow-up   History of Present Illness:   Veronica Solomon is a 66 y.o. female with a hx of HOCM who presents for follow-up.   She reports she can get short of breath with heavy activity.  Has noticed some improvement with metoprolol.  No murmur on examination.  We discussed her diagnosis of hypertrophic cardiomyopathy.  No LGE on cardiac MRI.  She overall is low risk.  We discussed that her children need testing.  We also discussed genetic testing.  She is interested.  I overall believe she is low risk for sudden cardiac death.  There is no family history of this.  She is not having any passing out spells.  We will proceed with monitor as well.  She denies any chest pain.  Her blood pressure is 130/60.  Pulse in the 50s.  Weight could be improved.  She is working on diet and exercise.  Problem List HOCM -sigmoid septum subtype  -no LVE -LVOT gradient 80 mmHG 2. HLD 3. COPD  Past Medical History: Past Medical History:  Diagnosis Date   Adenomatous colon polyp    Allergy    C. difficile colitis    Diverticulosis    Hyperlipidemia    Hypertension    IBS (irritable bowel syndrome)    Nasal congestion 06/14/1998   Neuromuscular disorder ([place])     Past Surgical History: Past Surgical History:  Procedure Laterality Date   ABDOMINAL HYSTERECTOMY  2002   fibroids    Current Medications: Current Meds  Medication Sig   albuterol (VENTOLIN HFA) 108 (90 Base) MCG/ACT inhaler Inhale 2 puffs into the lungs every 6 (six) hours as needed for wheezing or shortness of breath.   atorvastatin (LIPITOR) 20 MG tablet Take 1 tablet (20 mg total) by mouth daily.   celecoxib (CELEBREX) 200 MG capsule Take 1 capsule  (200 mg total) by mouth 2 (two) times daily as needed for moderate pain.   DULoxetine (CYMBALTA) 60 MG capsule Take 2 capsules (120 mg total) by mouth daily.   Fluticasone-Umeclidin-Vilant (TRELEGY ELLIPTA) 200-62.5-25 MCG/ACT AEPB Inhale 1 puff into the lungs daily.   HYDROcodone-acetaminophen (NORCO) 10-325 MG tablet Take 1 tablet by mouth every 8 (eight) hours as needed.   meclizine (ANTIVERT) 50 MG tablet Take 1 tablet (50 mg total) by mouth 3 (three) times daily as needed.   metoprolol tartrate (LOPRESSOR) 25 MG tablet Take 1 tablet (25 mg total) by mouth 2 (two) times daily.   pregabalin (LYRICA) 75 MG capsule TAKE 1 CAPSULE(75 MG) BY MOUTH TWICE DAILY   traMADol (ULTRAM) 50 MG tablet TAKE 1 TABLET(50 MG) BY MOUTH EVERY 6 HOURS AS NEEDED   [DISCONTINUED] Fluticasone-Umeclidin-Vilant (TRELEGY ELLIPTA) 200-62.5-25 MCG/ACT AEPB Inhale 1 puff into the lungs daily.     Allergies:    Paregoric, Penicillins, and Procaine hcl   Social History: Social History   Socioeconomic History   Marital status: Single    Spouse name: Not on file   Number of children: Not on file   Years of education: Not on file   Highest education level: Not on file  Occupational History   Occupation: self employed -- Herbalist  Tobacco Use   Smoking status: Former    Packs/day: 1.00    Years: 42.00    Total pack years: 42.00    Types: Cigarettes    Quit date: 12/29/2011    Years since quitting: 10.3   Smokeless tobacco: Former   Tobacco comments:    Quit 12/2011  Vaping Use   Vaping Use: Never used  Substance and Sexual Activity   Alcohol use: Yes    Comment: rarely   Drug use: No   Sexual activity: Not Currently  Other Topics Concern   Not on file  Social History Narrative   Exercise -- training dogs    Social Determinants of Health   Financial Resource Strain: Low Risk  (02/11/2021)   Overall Financial Resource Strain (CARDIA)    Difficulty of Paying Living Expenses: Not hard at all  Food  Insecurity: No Food Insecurity (02/11/2021)   Hunger Vital Sign    Worried About Running Out of Food in the Last Year: Never true    Ran Out of Food in the Last Year: Never true  Transportation Needs: No Transportation Needs (02/11/2021)   PRAPARE - Hydrologist (Medical): No    Lack of Transportation (Non-Medical): No  Physical Activity: Insufficiently Active (02/11/2021)   Exercise Vital Sign    Days of Exercise per Week: 7 days    Minutes of Exercise per Session: 20 min  Stress: Stress Concern Present (02/11/2021)   [place]    Feeling of Stress : To some extent  Social Connections: Socially Isolated (02/11/2021)   Social Connection and Isolation Panel [NHANES]    Frequency of Communication with Friends and Family: More than three times a week    Frequency of Social Gatherings with Friends and Family: More than three times a week    Attends Religious Services: Never    Marine scientist or Organizations: No    Attends Music therapist: Never    Marital Status: Never married     Family History: The patient's family history includes Depression in her mother; Diabetes in her maternal grandmother; Heart attack in her mother; Hypertension in her mother and paternal grandmother; Uterine cancer in her mother.  ROS:   All other ROS reviewed and negative. Pertinent positives noted in the HPI.     EKGs/Labs/Other Studies Reviewed:   The following studies were personally reviewed by me today:  CMR 02/2022 IMPRESSION: 1. 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   2. Moderate asymmetric LVH, 15 mm at the basal anteroseptum, with flow dephasing suggestive of LVOT obstruction. No systolic anterior motion of the mitral valve. Normal left ventricular chamber size and function, LVEF 58%.   3. No post contrast delayed  myocardial enhancement. No myocardial edema.   4.  Normal right ventricular chamber size and function, RVEF 53%.   5. Small circumferential pericardial effusion. No pericardial delayed enhancement.  Recent Labs: 05/11/2021: Pro B Natriuretic peptide (BNP) 14.0 08/03/2021: TSH 1.40 11/12/2021: ALT 15; Hemoglobin 13.1; Platelets 268.0 01/11/2022: BUN 16; Creatinine, Ser 0.74; Potassium 4.5; Sodium 141   Recent Lipid Panel    Component Value Date/Time   CHOL 176 11/12/2021 1417   TRIG 93.0 11/12/2021 1417   HDL 61.70 11/12/2021 1417   CHOLHDL 3 11/12/2021 1417   VLDL 18.6 11/12/2021 1417   LDLCALC 95 11/12/2021 1417   LDLCALC 79 08/01/2020 1407   LDLDIRECT  195.7 01/12/2010 0000    Physical Exam:   VS:  BP 136/60 (BP Location: Left Arm, Patient Position: Sitting, Cuff Size: Large)   Pulse (!) 53   Ht _0  (1.727 m)   Wt 248 lb (112.5 kg)   BMI 37.71 kg/m    Wt Readings from Last 3 Encounters:  04/16/22 248 lb (112.5 kg)  02/22/22 247 lb 6.4 oz (112.2 kg)  01/18/22 250 lb (113.4 kg)    General: Well nourished, well developed, in no acute distress Head: Atraumatic, normal size  Eyes: PEERLA, EOMI  Neck: Supple, no JVD Endocrine: No thryomegaly Cardiac: Normal S1, S2; RRR; no murmurs, rubs, or gallops Lungs: Clear to auscultation bilaterally, no wheezing, rhonchi or rales  Abd: Soft, nontender, no hepatomegaly  Ext: No edema, pulses 2+ Musculoskeletal: No deformities, BUE and BLE strength normal and equal Skin: Warm and dry, no rashes   Neuro: Alert and oriented to person, place, time, and situation, CNII-XII grossly intact, no focal deficits  Psych: Normal mood and affect   ASSESSMENT:   Veronica Solomon is a 66 y.o. female who presents for the following: 1. HOCM (hypertrophic obstructive cardiomyopathy) ([place])   2. SOB (shortness of breath) on exertion     PLAN:   1. HOCM (hypertrophic obstructive cardiomyopathy) ([place]) 2. SOB (shortness of breath) on  exertion -Echo with evidence of outflow tract obstruction.  CMR with no LGE.  This confirms a diagnosis.  She denies any chest pain.  She does get short of breath with heavy activity but symptoms have improved.  No murmur on examination since being on metoprolol.  No murmur with Valsalva either.  She does get short of breath with activity but has COPD.  I would like to give her some time for weight loss and continued exercise.  I think this will help.  She does not have any risk factors for sudden cardiac death.  No family history.  No passing out.  We discussed genetic testing.  She is interested.  We also discussed a 3-day Zio patch to exclude any arrhythmias.  Overall she is very low risk for sudden death and we will proceed with this testing.  I have also recommended screening ultrasounds for her children.  They may or may not need genetic testing based on her results.  She will see me back in 6 months.      Disposition: Return in about 1 year (around 04/17/2023).  Medication Adjustments/Labs and Tests Ordered: Current medicines are reviewed at length with the patient today.  Concerns regarding medicines are outlined above.  Orders Placed This Encounter  Procedures   LONG TERM MONITOR (3-14 DAYS)   No orders of the defined types were placed in this encounter.   Patient Instructions  Medication Instructions:  The current medical regimen is effective;  continue present plan and medications.  *If you need a refill on your cardiac medications before your next appointment, please call your pharmacy*   Testing/Procedures:  [place] Monitor Instructions  Your physician has requested you wear a ZIO patch monitor for 3 days.  This is a single patch monitor. [employer] supplies one patch monitor per enrollment. Additional stickers are not available. Please do not apply patch if you will be having a Nuclear Stress Test,  Echocardiogram, Cardiac CT, MRI, or Chest Xray during the period  you would be wearing the  monitor. The patch cannot be worn during these tests. You cannot remove and re-apply the  ZIO XT  patch monitor.  Your ZIO patch monitor will be mailed 3 day USPS to your address on file. It may take 3-5 days  to receive your monitor after you have been enrolled.  Once you have received your monitor, please review the enclosed instructions. Your monitor  has already been registered assigning a specific monitor serial # to you.  Billing and Patient Assistance Program Information  We have supplied [employer] with any of your insurance information on file for billing purposes. [employer] offers a sliding scale Patient Assistance Program for patients that do not have  insurance, or whose insurance does not completely cover the cost of the ZIO monitor.  You must apply for the Patient Assistance Program to qualify for this discounted rate.  To apply, please call [employer] at [phone], select option 4, select option 2, ask to apply for  Patient Assistance Program. Theodore Demark will ask your household income, and how many people  are in your household. They will quote your out-of-pocket cost based on that information.  [employer] will also be able to set up a 42-month interest-free payment plan if needed.  Applying the monitor   Shave hair from upper left chest.  Hold abrader disc by orange tab. Rub abrader in 40 strokes over the upper left chest as  indicated in your monitor instructions.  Clean area with 4 enclosed alcohol pads. Let dry.  Apply patch as indicated in monitor instructions. Patch will be placed under collarbone on left  side of chest with arrow pointing upward.  Rub patch adhesive wings for 2 minutes. Remove white label marked "1". Remove the white  label marked "2". Rub patch adhesive wings for 2 additional minutes.  While looking in a mirror, press and release button in center of patch. A small green light will  flash 3-4 times. This will be your only indicator  that the monitor has been turned on.  Do not shower for the first 24 hours. You may shower after the first 24 hours.  Press the button if you feel a symptom. You will hear a small click. Record Date, Time and  Symptom in the Patient Logbook.  When you are ready to remove the patch, follow instructions on the last 2 pages of Patient  Logbook. Stick patch monitor onto the last page of Patient Logbook.  Place Patient Logbook in the blue and white box. Use locking tab on box and tape box closed  securely. The blue and white box has prepaid postage on it. Please place it in the mailbox as  soon as possible. Your physician should have your test results approximately 7 days after the  [facility]  Call [place]at [phone] you have questions regarding  your ZIO XT patch monitor. Call them immediately if you see an orange light blinking on your  monitor.  If your monitor falls off in less than 4 days, contact our Monitor department at [phone]  If your monitor becomes loose or falls off after 4 days call [employer] at [phone]  suggestions on securing your monitor    Follow-Up: [facility] you and your health needs are our priority.  As part of our continuing mission to provide you with exceptional heart care, we have created designated Provider Care Teams.  These Care Teams include your primary Cardiologist (physician) and Advanced Practice Providers (APPs -  Physician Assistants and Nurse Practitioners) who all work together to provide you with the care  you need, when you need it.  We recommend signing up for the patient portal called "MyChart".  Sign up information is provided on this After Visit Summary.  MyChart is used to connect with patients for Virtual Visits (Telemedicine).  Patients are able to view lab/test results, encounter notes, upcoming appointments, etc.  Non-urgent messages can be sent to your  provider as well.   To learn more about what you can do with MyChart, go to [email or web address].    Your next appointment:   6 month(s)  The format for your next appointment:   In Person  Provider:   Eleonore Chiquito, MD    They will send you a kit in the mail for the genetic testing- please complete this and mail back once done. We will call you with results.       Time Spent with Patient: I have spent a total of 35 minutes with patient reviewing hospital notes, telemetry, EKGs, labs and examining the patient as well as establishing an assessment and plan that was discussed with the patient.  > 50% of time was spent in direct patient care.  Signed, Addison Naegeli. Audie Box, MD, [place]  [address] [place], [place] [address] [phone]  04/16/2022 10:15 AM

## 2022-04-16 ENCOUNTER — Encounter: Payer: Self-pay | Admitting: Cardiovascular Disease

## 2022-04-16 ENCOUNTER — Ambulatory Visit: Payer: Medicare PPO | Attending: Cardiovascular Disease | Admitting: Cardiovascular Disease

## 2022-04-16 ENCOUNTER — Ambulatory Visit (INDEPENDENT_AMBULATORY_CARE_PROVIDER_SITE_OTHER): Payer: Medicare PPO

## 2022-04-16 VITALS — BP 136/60 | HR 53 | Ht 68.0 in | Wt 248.0 lb

## 2022-04-16 DIAGNOSIS — R0602 Shortness of breath: Secondary | ICD-10-CM

## 2022-04-16 DIAGNOSIS — I421 Obstructive hypertrophic cardiomyopathy: Secondary | ICD-10-CM

## 2022-04-16 NOTE — Progress Notes (Unsigned)
Enrolled patient for a 3 day Zio XT monitor to be mailed to patients home  

## 2022-04-16 NOTE — Patient Instructions (Addendum)
Medication Instructions:  The current medical regimen is effective;  continue present plan and medications.  *If you need a refill on your cardiac medications before your next appointment, please call your pharmacy*   Testing/Procedures:  [place] Monitor Instructions  Your physician has requested you wear a ZIO patch monitor for 3 days.  This is a single patch monitor. [employer] supplies one patch monitor per enrollment. Additional stickers are not available. Please do not apply patch if you will be having a Nuclear Stress Test,  Echocardiogram, Cardiac CT, MRI, or Chest Xray during the period you would be wearing the  monitor. The patch cannot be worn during these tests. You cannot remove and re-apply the  ZIO XT patch monitor.  Your ZIO patch monitor will be mailed 3 day USPS to your address on file. It may take 3-5 days  to receive your monitor after you have been enrolled.  Once you have received your monitor, please review the enclosed instructions. Your monitor  has already been registered assigning a specific monitor serial # to you.  Billing and Patient Assistance Program Information  We have supplied [employer] with any of your insurance information on file for billing purposes. [employer] offers a sliding scale Patient Assistance Program for patients that do not have  insurance, or whose insurance does not completely cover the cost of the ZIO monitor.  You must apply for the Patient Assistance Program to qualify for this discounted rate.  To apply, please call [employer] at [phone], select option 4, select option 2, ask to apply for  Patient Assistance Program. Theodore Demark will ask your household income, and how many people  are in your household. They will quote your out-of-pocket cost based on that information.  [employer] will also be able to set up a 29-month interest-free payment plan if needed.  Applying the monitor   Shave hair from upper left chest.  Hold abrader  disc by orange tab. Rub abrader in 40 strokes over the upper left chest as  indicated in your monitor instructions.  Clean area with 4 enclosed alcohol pads. Let dry.  Apply patch as indicated in monitor instructions. Patch will be placed under collarbone on left  side of chest with arrow pointing upward.  Rub patch adhesive wings for 2 minutes. Remove white label marked "1". Remove the white  label marked "2". Rub patch adhesive wings for 2 additional minutes.  While looking in a mirror, press and release button in center of patch. A small green light will  flash 3-4 times. This will be your only indicator that the monitor has been turned on.  Do not shower for the first 24 hours. You may shower after the first 24 hours.  Press the button if you feel a symptom. You will hear a small click. Record Date, Time and  Symptom in the Patient Logbook.  When you are ready to remove the patch, follow instructions on the last 2 pages of Patient  Logbook. Stick patch monitor onto the last page of Patient Logbook.  Place Patient Logbook in the blue and white box. Use locking tab on box and tape box closed  securely. The blue and white box has prepaid postage on it. Please place it in the mailbox as  soon as possible. Your physician should have your test results approximately 7 days after the  monitor has been [facility] to [facility]  Call [place]at [phone] you have questions regarding  your ZIO  XT patch monitor. Call them immediately if you see an orange light blinking on your  monitor.  If your monitor falls off in less than 4 days, contact our Monitor department at [phone].  If your monitor becomes loose or falls off after 4 days call [employer] at [phone] for  suggestions on securing your monitor    Follow-Up: At [facility], you and your health needs are our priority.  As part of our continuing mission to provide you with exceptional heart  care, we have created designated Provider Care Teams.  These Care Teams include your primary Cardiologist (physician) and Advanced Practice Providers (APPs -  Physician Assistants and Nurse Practitioners) who all work together to provide you with the care you need, when you need it.  We recommend signing up for the patient portal called "MyChart".  Sign up information is provided on this After Visit Summary.  MyChart is used to connect with patients for Virtual Visits (Telemedicine).  Patients are able to view lab/test results, encounter notes, upcoming appointments, etc.  Non-urgent messages can be sent to your provider as well.   To learn more about what you can do with MyChart, go to [email or web address].    Your next appointment:   6 month(s)  The format for your next appointment:   In Person  Provider:   Eleonore Chiquito, MD    They will send you a kit in the mail for the genetic testing- please complete this and mail back once done. We will call you with results.

## 2022-04-21 DIAGNOSIS — I421 Obstructive hypertrophic cardiomyopathy: Secondary | ICD-10-CM

## 2022-05-04 DIAGNOSIS — I421 Obstructive hypertrophic cardiomyopathy: Secondary | ICD-10-CM | POA: Diagnosis not present

## 2022-05-05 DIAGNOSIS — M5431 Sciatica, right side: Secondary | ICD-10-CM | POA: Diagnosis not present

## 2022-05-05 DIAGNOSIS — M9905 Segmental and somatic dysfunction of pelvic region: Secondary | ICD-10-CM | POA: Diagnosis not present

## 2022-05-05 DIAGNOSIS — M5134 Other intervertebral disc degeneration, thoracic region: Secondary | ICD-10-CM | POA: Diagnosis not present

## 2022-05-05 DIAGNOSIS — M5136 Other intervertebral disc degeneration, lumbar region: Secondary | ICD-10-CM | POA: Diagnosis not present

## 2022-05-05 DIAGNOSIS — M9903 Segmental and somatic dysfunction of lumbar region: Secondary | ICD-10-CM | POA: Diagnosis not present

## 2022-05-05 DIAGNOSIS — M9902 Segmental and somatic dysfunction of thoracic region: Secondary | ICD-10-CM | POA: Diagnosis not present

## 2022-05-14 ENCOUNTER — Ambulatory Visit (INDEPENDENT_AMBULATORY_CARE_PROVIDER_SITE_OTHER): Payer: Medicare PPO | Admitting: Family Medicine

## 2022-05-14 ENCOUNTER — Other Ambulatory Visit (HOSPITAL_BASED_OUTPATIENT_CLINIC_OR_DEPARTMENT_OTHER): Payer: Self-pay

## 2022-05-14 ENCOUNTER — Telehealth: Payer: Self-pay | Admitting: *Deleted

## 2022-05-14 ENCOUNTER — Encounter: Payer: Self-pay | Admitting: Family Medicine

## 2022-05-14 VITALS — BP 122/68 | HR 55 | Temp 98.0°F | Resp 18 | Ht 68.0 in | Wt 250.4 lb

## 2022-05-14 DIAGNOSIS — M7661 Achilles tendinitis, right leg: Secondary | ICD-10-CM

## 2022-05-14 DIAGNOSIS — E538 Deficiency of other specified B group vitamins: Secondary | ICD-10-CM

## 2022-05-14 DIAGNOSIS — I10 Essential (primary) hypertension: Secondary | ICD-10-CM

## 2022-05-14 DIAGNOSIS — G894 Chronic pain syndrome: Secondary | ICD-10-CM

## 2022-05-14 DIAGNOSIS — E2839 Other primary ovarian failure: Secondary | ICD-10-CM | POA: Diagnosis not present

## 2022-05-14 DIAGNOSIS — Z1211 Encounter for screening for malignant neoplasm of colon: Secondary | ICD-10-CM | POA: Diagnosis not present

## 2022-05-14 DIAGNOSIS — Z79899 Other long term (current) drug therapy: Secondary | ICD-10-CM

## 2022-05-14 DIAGNOSIS — E559 Vitamin D deficiency, unspecified: Secondary | ICD-10-CM | POA: Diagnosis not present

## 2022-05-14 DIAGNOSIS — E785 Hyperlipidemia, unspecified: Secondary | ICD-10-CM

## 2022-05-14 MED ORDER — COMIRNATY 30 MCG/0.3ML IM SUSY
PREFILLED_SYRINGE | INTRAMUSCULAR | 0 refills | Status: DC
  Filled 2022-05-14: qty 0.3, 1d supply, fill #0

## 2022-05-14 MED ORDER — AREXVY 120 MCG/0.5ML IM SUSR
INTRAMUSCULAR | 0 refills | Status: DC
  Filled 2022-05-14: qty 1, 1d supply, fill #0

## 2022-05-14 NOTE — Assessment & Plan Note (Signed)
Encourage heart healthy diet such as MIND or DASH diet, increase exercise, avoid trans fats, simple carbohydrates and processed foods, consider a krill or fish or flaxseed oil cap daily.  °

## 2022-05-14 NOTE — Telephone Encounter (Signed)
Patient called back and a lab appt was made.

## 2022-05-14 NOTE — Telephone Encounter (Signed)
Pt saw PCP this afternoon and left without stopping for bloodwork.  Left message for pt to return my call to schedule lab appointment to complete orders.

## 2022-05-14 NOTE — Assessment & Plan Note (Signed)
Air cast  Refer to [facility]

## 2022-05-14 NOTE — Progress Notes (Addendum)
Subjective:   By signing my name below, I, Veronica Solomon, attest that this documentation has been prepared under the direction and in the presence of Veronica Solomon 05/14/2022    Patient ID: Veronica Solomon Hence, female    DOB: [birth year], 66 y.o.   MRN: [id number]  Chief Complaint  Patient presents with   Hypertension   Hyperlipidemia   Follow-up    HPI Patient is in today for an office visit  She complains of an achilles tendon pain that appeared on 05/11/2022. She is not sure what caused the symptom to arise. She states that it hurts to walk and hurts to bend the joint. She has been icing the area and keeping it elevated but symptoms are prevalent.   Her blood pressure as of today's visit is normal BP Readings from Last 3 Encounters:  05/14/22 122/68  04/16/22 136/60  02/22/22 126/78   Pulse Readings from Last 3 Encounters:  05/14/22 (!) 55  04/16/22 (!) 53  02/22/22 86   She is currently due for a mammogram. Her last completed mammogram on 03/11/2021  She reports that she is UTD on her dexa scans.   She is not UTD on the Covid vaccine. She is planning to receive both the RSV and Covid vaccine after today's visit.   Past Medical History:  Diagnosis Date   Adenomatous colon polyp    Allergy    C. difficile colitis    Diverticulosis    Hyperlipidemia    Hypertension    IBS (irritable bowel syndrome)    Nasal congestion 06/14/1998   Neuromuscular disorder ([place])     Past Surgical History:  Procedure Laterality Date   ABDOMINAL HYSTERECTOMY  06/14/2000   fibroids   PLANTAR FASCIECTOMY      Family History  Problem Relation Age of Onset   Hypertension Mother    Depression Mother    Heart attack Mother    Uterine cancer Mother    Diabetes Maternal Grandmother    Hypertension Paternal Grandmother     Social History   Socioeconomic History   Marital status: Single    Spouse name: Not on file   Number of children: Not on file   Years of  education: Not on file   Highest education level: Not on file  Occupational History   Occupation: self employed -- Herbalist  Tobacco Use   Smoking status: Former    Packs/day: 1.00    Years: 42.00    Total pack years: 42.00    Types: Cigarettes    Quit date: 12/29/2011    Years since quitting: 10.3   Smokeless tobacco: Former   Tobacco comments:    Quit 12/2011  Vaping Use   Vaping Use: Never used  Substance and Sexual Activity   Alcohol use: Yes    Comment: rarely   Drug use: No   Sexual activity: Not Currently  Other Topics Concern   Not on file  Social History Narrative   Exercise -- training dogs    Social Determinants of Health   Financial Resource Strain: Low Risk  (02/11/2021)   Overall Financial Resource Strain (CARDIA)    Difficulty of Paying Living Expenses: Not hard at all  Food Insecurity: No Food Insecurity (02/11/2021)   Hunger Vital Sign    Worried About Running Out of Food in the Last Year: Never true    Ran Out of Food in the Last Year: Never true  Transportation Needs: No Transportation Needs (02/11/2021)  PRAPARE - Hydrologist (Medical): No    Lack of Transportation (Non-Medical): No  Physical Activity: Insufficiently Active (02/11/2021)   Exercise Vital Sign    Days of Exercise per Week: 7 days    Minutes of Exercise per Session: 20 min  Stress: Stress Concern Present (02/11/2021)   [place]    Feeling of Stress : To some extent  Social Connections: Socially Isolated (02/11/2021)   Social Connection and Isolation Panel [NHANES]    Frequency of Communication with Friends and Family: More than three times a week    Frequency of Social Gatherings with Friends and Family: More than three times a week    Attends Religious Services: Never    Marine scientist or Organizations: No    Attends Archivist Meetings: Never    Marital Status: Never  married  Intimate Partner Violence: Not At Risk (02/11/2021)   Humiliation, Afraid, Rape, and Kick questionnaire    Fear of Current or Ex-Partner: No    Emotionally Abused: No    Physically Abused: No    Sexually Abused: No    Outpatient Medications Prior to Visit  Medication Sig Dispense Refill   albuterol (VENTOLIN HFA) 108 (90 Base) MCG/ACT inhaler Inhale 2 puffs into the lungs every 6 (six) hours as needed for wheezing or shortness of breath. 18 g 5   atorvastatin (LIPITOR) 20 MG tablet Take 1 tablet (20 mg total) by mouth daily. 90 tablet 1   celecoxib (CELEBREX) 200 MG capsule Take 1 capsule (200 mg total) by mouth 2 (two) times daily as needed for moderate pain. 180 capsule 3   DULoxetine (CYMBALTA) 60 MG capsule Take 2 capsules (120 mg total) by mouth daily. 180 capsule 1   Fluticasone-Umeclidin-Vilant (TRELEGY ELLIPTA) 200-62.5-25 MCG/ACT AEPB Inhale 1 puff into the lungs daily. 14 each 0   HYDROcodone-acetaminophen (NORCO) 10-325 MG tablet Take 1 tablet by mouth every 8 (eight) hours as needed. 30 tablet 0   meclizine (ANTIVERT) 50 MG tablet Take 1 tablet (50 mg total) by mouth 3 (three) times daily as needed. 30 tablet 0   metoprolol tartrate (LOPRESSOR) 25 MG tablet Take 1 tablet (25 mg total) by mouth 2 (two) times daily. 180 tablet 3   pregabalin (LYRICA) 75 MG capsule TAKE 1 CAPSULE(75 MG) BY MOUTH TWICE DAILY 180 capsule 1   traMADol (ULTRAM) 50 MG tablet TAKE 1 TABLET(50 MG) BY MOUTH EVERY 6 HOURS AS NEEDED 90 tablet 0   No facility-administered medications prior to visit.    Allergies  Allergen Reactions   Paregoric Hives    REACTION: HIVES   Penicillins Rash    REACTION: RASH   Procaine Hcl Other (See Comments)    Review of Systems  Constitutional:  Negative for chills, fever and malaise/fatigue.  HENT:  Negative for congestion and hearing loss.   Eyes:  Negative for discharge.  Respiratory:  Negative for cough, sputum production and shortness of breath.    Cardiovascular:  Negative for chest pain, palpitations and leg swelling.  Gastrointestinal:  Negative for abdominal pain, blood in stool, constipation, diarrhea, heartburn, nausea and vomiting.  Genitourinary:  Negative for dysuria, frequency, hematuria and urgency.  Musculoskeletal:  Negative for back pain, falls and myalgias.       (+) Achilles Tendon Pain  Skin:  Negative for rash.  Neurological:  Negative for dizziness, sensory change, loss of consciousness, weakness and headaches.  Endo/Heme/Allergies:  Negative for environmental allergies. Does not bruise/bleed easily.  Psychiatric/Behavioral:  Negative for depression and suicidal ideas. The patient is not nervous/anxious and does not have insomnia.        Objective:    Physical Exam Vitals and nursing note reviewed.  Constitutional:      General: She is not in acute distress.    Appearance: Normal appearance. She is not ill-appearing.  HENT:     Head: Normocephalic and atraumatic.     Right Ear: External ear normal.     Left Ear: External ear normal.  Eyes:     Extraocular Movements: Extraocular movements intact.     Pupils: Pupils are equal, round, and reactive to light.  Cardiovascular:     Rate and Rhythm: Normal rate and regular rhythm.     Heart sounds: Normal heart sounds. No murmur heard.    No gallop.  Pulmonary:     Effort: Pulmonary effort is normal. No respiratory distress.     Breath sounds: Normal breath sounds. No wheezing or rales.  Skin:    General: Skin is warm and dry.  Neurological:     Mental Status: She is alert and oriented to person, place, and time.  Psychiatric:        Judgment: Judgment normal.     BP 122/68 (BP Location: Left Arm, Patient Position: Sitting, Cuff Size: Large)   Pulse (!) 55   Temp 98 F (36.7 C) (Oral)   Resp 18   Ht '5\' 8"'$  (1.727 m)   Wt 250 lb 6.4 oz (113.6 kg)   SpO2 99%   BMI 38.07 kg/m  Wt Readings from Last 3 Encounters:  05/14/22 250 lb 6.4 oz (113.6 kg)   04/16/22 248 lb (112.5 kg)  02/22/22 247 lb 6.4 oz (112.2 kg)    Diabetic Foot Exam - Simple   No data filed    Lab Results  Component Value Date   WBC 9.3 11/12/2021   HGB 13.1 11/12/2021   HCT 39.7 11/12/2021   PLT 268.0 11/12/2021   GLUCOSE 99 01/11/2022   CHOL 176 11/12/2021   TRIG 93.0 11/12/2021   HDL 61.70 11/12/2021   LDLDIRECT 195.7 01/12/2010   LDLCALC 95 11/12/2021   ALT 15 11/12/2021   AST 16 11/12/2021   NA 141 01/11/2022   K 4.5 01/11/2022   CL 104 01/11/2022   CREATININE 0.74 01/11/2022   BUN 16 01/11/2022   CO2 29 01/11/2022   TSH 1.40 08/03/2021   HGBA1C 5.8 12/30/2014   MICROALBUR 4.1 (H) 11/24/2012    Lab Results  Component Value Date   TSH 1.40 08/03/2021   Lab Results  Component Value Date   WBC 9.3 11/12/2021   HGB 13.1 11/12/2021   HCT 39.7 11/12/2021   MCV 87.6 11/12/2021   PLT 268.0 11/12/2021   Lab Results  Component Value Date   NA 141 01/11/2022   K 4.5 01/11/2022   CO2 29 01/11/2022   GLUCOSE 99 01/11/2022   BUN 16 01/11/2022   CREATININE 0.74 01/11/2022   BILITOT 0.6 11/12/2021   ALKPHOS 102 11/12/2021   AST 16 11/12/2021   ALT 15 11/12/2021   PROT 7.2 11/12/2021   ALBUMIN 4.5 11/12/2021   CALCIUM 9.8 01/11/2022   GFR 84.95 01/11/2022   Lab Results  Component Value Date   CHOL 176 11/12/2021   Lab Results  Component Value Date   HDL 61.70 11/12/2021   Lab Results  Component Value Date   LDLCALC 95 11/12/2021  Lab Results  Component Value Date   TRIG 93.0 11/12/2021   Lab Results  Component Value Date   CHOLHDL 3 11/12/2021   Lab Results  Component Value Date   HGBA1C 5.8 12/30/2014       Assessment & Plan:   Problem List Items Addressed This Visit       Unprioritized   Vitamin D deficiency   Relevant Orders   VITAMIN D 25 Hydroxy (Vit-D Deficiency, Fractures)   Primary hypertension    Well controlled, no changes to meds. Encouraged heart healthy diet such as the DASH diet and exercise as  tolerated.        Relevant Orders   Comprehensive metabolic panel   Lipid panel   Hyperlipidemia    Encourage heart healthy diet such as MIND or DASH diet, increase exercise, avoid trans fats, simple carbohydrates and processed foods, consider a krill or fish or flaxseed oil cap daily.        Relevant Orders   Comprehensive metabolic panel   Lipid panel   Chronic pain syndrome   B12 deficiency   Relevant Orders   Vitamin B12   Achilles tendinitis of right lower extremity - Primary    Air cast  Refer to sport med      Relevant Orders   Ambulatory referral to Sports Medicine   Other Visit Diagnoses     Estrogen deficiency       Relevant Orders   DG Bone Density   Colon cancer screening       Relevant Orders   Ambulatory referral to Gastroenterology   High risk medication use       Relevant Orders   Drug Monitoring Panel [phone] , Urine      No orders of the defined types were placed in this encounter.   IAnn Held, Solomon, personally preformed the services described in this documentation.  All medical record entries made by the scribe were at my direction and in my presence.  I have reviewed the chart and discharge instructions (if applicable) and agree that the record reflects my personal performance and is accurate and complete. 05/14/2022   I,Amber Collins,acting as a scribe for Veronica Held, Solomon.,have documented all relevant documentation on the behalf of Veronica Held, Solomon,as directed by  Veronica Held, Solomon while in the presence of Veronica Held, Solomon.    Veronica Held, Solomon

## 2022-05-14 NOTE — Assessment & Plan Note (Signed)
Well controlled, no changes to meds. Encouraged heart healthy diet such as the DASH diet and exercise as tolerated.  °

## 2022-05-14 NOTE — Patient Instructions (Signed)
Achilles Tendinitis  Achilles tendinitis is inflammation of the tough, cord-like band that attaches the lower leg muscles to the heel bone (Achilles tendon). This is usually caused by overusing the tendon and the ankle joint. Achilles tendinitis usually gets better over time with treatment and caring for yourself at home. It can take weeks or months to heal completely. What are the causes? This condition may be caused by: A sudden increase in exercise or activity, such as running. Doing the same exercises or activities, such as jumping, over and over. Not warming up calf muscles before exercising. Exercising in shoes that are worn out or not made for exercise. Having arthritis or a bone growth (spur) on the back of the heel bone. This can rub against the tendon and hurt it. Age-related wear and tear. Tendons become less flexible with age and are more likely to be injured. What are the signs or symptoms? Common symptoms of this condition include: Pain in the Achilles tendon or in the back of the leg, just above the heel. The pain usually gets worse with exercise. Stiffness or soreness in the back of the leg, especially in the morning. Swelling of the skin over the Achilles tendon. Thickening of the tendon. Trouble standing on tiptoe. How is this diagnosed? This condition is diagnosed based on your symptoms and a physical exam. You may have tests, including: X-rays. MRI. How is this treated? The goal of treatment is to relieve symptoms and help your injury heal. Treatment may include: Decreasing or stopping activities that caused the tendinitis. This may mean switching to low-impact exercises like biking or swimming. Icing the injured area. Doing physical therapy, including strengthening and stretching exercises. Taking NSAIDs, such as ibuprofen, to help relieve pain and swelling. Using supportive shoes, wraps, heel lifts, or a walking boot (air cast). Having surgery. This may be done if  your symptoms do not improve after other treatments. Using high-energy shock wave impulses to stimulate the healing process (extracorporeal shock wave therapy). This is rare. Having an injection of medicines that help relieve inflammation (corticosteroids). This is rare. Follow these instructions at home: If you have an air cast: Wear the air cast as told by your health care provider. Remove it only as told by your health care provider. Loosen it if your toes tingle, become numb, or turn cold and blue. Keep it clean. If the air cast is not waterproof: Do not let it get wet. Cover it with a watertight covering when you take a bath or shower. Managing pain, stiffness, and swelling  If directed, put ice on the injured area. To do this: If you have a removable air cast, remove it as told by your health care provider. Put ice in a plastic bag. Place a towel between your skin and the bag. Leave the ice on for 20 minutes, 2-3 times a day. Move your toes often to reduce stiffness and swelling. Raise (elevate) your foot above the level of your heart while you are sitting or lying down. Activity Gradually return to your normal activities as told by your health care provider. Ask your health care provider what activities are safe for you. Do not do activities that cause pain. Consider doing low-impact exercises, like cycling or swimming. Ask your health care provider when it is safe to drive if you have an air cast on your foot. If physical therapy was prescribed, do exercises as told by your health care provider or physical therapist. General instructions If directed, wrap your   foot with an elastic bandage or other wrap. This can help to keep your tendon from moving too much while it heals. Your health care provider will show you how to wrap your foot correctly. Wear supportive shoes or heel lifts only as told by your health care provider. Take over-the-counter and prescription medicines only as  told by your health care provider. Keep all follow-up visits as told by your health care provider. This is important. Contact a health care provider if you: Have symptoms that get worse. Have pain that does not get better with medicine. Develop new, unexplained symptoms. Develop warmth and swelling in your foot. Have a fever. Get help right away if you: Have a sudden popping sound or sensation in your Achilles tendon followed by severe pain. Cannot move your toes or foot. Cannot put any weight on your foot. Your foot or toes become numb and look white or blue even after loosening your bandage or air cast. Summary Achilles tendinitis is inflammation of the tough, cord-like band that attaches the lower leg muscles to the heel bone (Achilles tendon). This condition is usually caused by overusing the tendon and the ankle joint. It can also be caused by arthritis or normal aging. The most common symptoms of this condition include pain, swelling, or stiffness in the Achilles tendon or in the back of the leg. This condition is usually treated by decreasing or stopping activities that caused the tendinitis, icing the injured area, taking NSAIDs, and doing physical therapy. This information is not intended to replace advice given to you by your health care provider. Make sure you discuss any questions you have with your health care provider. Document Revised: 10/16/2018 Document Reviewed: 10/16/2018 Elsevier Patient Education  2023 [employer] Inc[employer]  

## 2022-05-16 LAB — DRUG MONITORING PANEL 376104, URINE
Amphetamines: NEGATIVE ng/mL (ref <500)
Barbiturates: NEGATIVE ng/mL (ref <300)
Benzodiazepines: NEGATIVE ng/mL (ref <100)
Cocaine Metabolite: NEGATIVE ng/mL (ref <150)
Desmethyltramadol: 302 ng/mL — ABNORMAL HIGH (ref <100)
Opiates: NEGATIVE ng/mL (ref <100)
Oxycodone: NEGATIVE ng/mL (ref <100)
Tramadol: 1502 ng/mL — ABNORMAL HIGH (ref <100)

## 2022-05-16 LAB — DM TEMPLATE

## 2022-05-17 ENCOUNTER — Other Ambulatory Visit (INDEPENDENT_AMBULATORY_CARE_PROVIDER_SITE_OTHER): Payer: Medicare PPO

## 2022-05-17 DIAGNOSIS — I10 Essential (primary) hypertension: Secondary | ICD-10-CM

## 2022-05-17 DIAGNOSIS — E538 Deficiency of other specified B group vitamins: Secondary | ICD-10-CM

## 2022-05-17 DIAGNOSIS — E559 Vitamin D deficiency, unspecified: Secondary | ICD-10-CM

## 2022-05-17 DIAGNOSIS — E785 Hyperlipidemia, unspecified: Secondary | ICD-10-CM

## 2022-05-18 LAB — COMPREHENSIVE METABOLIC PANEL
ALT: 13 U/L (ref 0–35)
AST: 15 U/L (ref 0–37)
Albumin: 4.2 g/dL (ref 3.5–5.2)
Alkaline Phosphatase: 96 U/L (ref 39–117)
BUN: 21 mg/dL (ref 6–23)
CO2: 29 mEq/L (ref 19–32)
Calcium: 9.4 mg/dL (ref 8.4–10.5)
Chloride: 103 mEq/L (ref 96–112)
Creatinine, Ser: 0.87 mg/dL (ref 0.40–1.20)
GFR: 69.78 mL/min (ref >60.00)
Glucose, Bld: 98 mg/dL (ref 70–99)
Potassium: 4.9 mEq/L (ref 3.5–5.1)
Sodium: 140 mEq/L (ref 135–145)
Total Bilirubin: 0.4 mg/dL (ref 0.2–1.2)
Total Protein: 6.6 g/dL (ref 6.0–8.3)

## 2022-05-18 LAB — VITAMIN B12: Vitamin B-12: 702 pg/mL (ref 211–911)

## 2022-05-18 LAB — VITAMIN D 25 HYDROXY (VIT D DEFICIENCY, FRACTURES): VITD: 53.53 ng/mL (ref 30.00–100.00)

## 2022-05-18 LAB — LIPID PANEL
Cholesterol: 149 mg/dL (ref 0–200)
HDL: 50.4 mg/dL (ref >39.00)
LDL Cholesterol: 79 mg/dL (ref 0–99)
NonHDL: 98.82
Total CHOL/HDL Ratio: 3
Triglycerides: 100 mg/dL (ref 0.0–149.0)
VLDL: 20 mg/dL (ref 0.0–40.0)

## 2022-05-21 ENCOUNTER — Other Ambulatory Visit: Payer: Self-pay | Admitting: Family Medicine

## 2022-05-21 DIAGNOSIS — E559 Vitamin D deficiency, unspecified: Secondary | ICD-10-CM

## 2022-05-23 ENCOUNTER — Other Ambulatory Visit: Payer: Self-pay | Admitting: Family Medicine

## 2022-05-23 DIAGNOSIS — I10 Essential (primary) hypertension: Secondary | ICD-10-CM

## 2022-05-24 ENCOUNTER — Ambulatory Visit (INDEPENDENT_AMBULATORY_CARE_PROVIDER_SITE_OTHER): Payer: Medicare PPO

## 2022-05-24 ENCOUNTER — Ambulatory Visit (INDEPENDENT_AMBULATORY_CARE_PROVIDER_SITE_OTHER): Payer: Medicare PPO | Admitting: Sports Medicine

## 2022-05-24 VITALS — BP 130/84 | HR 55 | Ht 68.0 in | Wt 250.0 lb

## 2022-05-24 DIAGNOSIS — M7661 Achilles tendinitis, right leg: Secondary | ICD-10-CM | POA: Diagnosis not present

## 2022-05-24 DIAGNOSIS — G8929 Other chronic pain: Secondary | ICD-10-CM

## 2022-05-24 DIAGNOSIS — M25571 Pain in right ankle and joints of right foot: Secondary | ICD-10-CM | POA: Diagnosis not present

## 2022-05-24 MED ORDER — MELOXICAM 15 MG PO TABS: 15.0000 mg | ORAL_TABLET | Freq: Every day | ORAL | 0 refills | Status: DC

## 2022-05-24 NOTE — Progress Notes (Signed)
Veronica Solomon D.[name] Phone: [phone]   Assessment and Plan:    1. Chronic pain of right ankle 2. Achilles tendinitis of right lower extremity -Chronic with exacerbation, initial sports medicine visit - Most consistent with Achilles tendinitis based on pain with eccentric loading of Achilles tendon, and patient's description of pain with relatively unremarkable x-ray imaging - X-ray obtained in clinic.  My interpretation: No acute fracture dislocation.  Prior pinning from bunion surgery seen on x-ray.  Pes planus - Start meloxicam 15 mg daily x2 weeks.  If still having pain after 2 weeks, complete 3rd-week of meloxicam. May use remaining meloxicam as needed once daily for pain control.  Do not to use additional NSAIDs while taking meloxicam.  May use Tylenol [phone] mg 2 to 3 times a day for breakthrough pain. -Start HEP and physical therapy for Achilles tendinitis - May use heating pad over gastroc musculature to aid in stretching - Ambulatory referral to Physical Therapy  Other orders - meloxicam (MOBIC) 15 MG tablet; Take 1 tablet (15 mg total) by mouth daily.    Pertinent previous records reviewed include none   Follow Up: 4 to 6 weeks for reevaluation.  If no improvement or worsening of symptoms, could consider PRP injection versus ECSWT versus ultrasound   Subjective:   I, Veronica Solomon, am serving as a Education administrator for Doctor Glennon Mac  Chief Complaint: right achilles pain   HPI:   05/24/22 Patient is a 66 year old female complaining of right achilles pain. Patient states that appeared on 05/11/2022. She is not sure what caused the symptom to arise. She states that it hurts to walk and hurts to bend the joint. She has been icing the area and keeping it elevated but symptoms are prevalent. Pain sometimes radiates to the heel of her foot , no MOI, no noticeable bumps , no numbness or  tingling, Celebrex , tramadol, and hydrocodone and those dont help   Relevant Historical Information: Hypertension, CAD  Additional pertinent review of systems negative.   Current Outpatient Medications:    albuterol (VENTOLIN HFA) 108 (90 Base) MCG/ACT inhaler, Inhale 2 puffs into the lungs every 6 (six) hours as needed for wheezing or shortness of breath., Disp: 18 g, Rfl: 5   atorvastatin (LIPITOR) 20 MG tablet, Take 1 tablet (20 mg total) by mouth daily., Disp: 90 tablet, Rfl: 1   celecoxib (CELEBREX) 200 MG capsule, Take 1 capsule (200 mg total) by mouth 2 (two) times daily as needed for moderate pain., Disp: 180 capsule, Rfl: 3   COVID-19 mRNA vaccine 2023-2024 (COMIRNATY) syringe, Inject into the muscle., Disp: 0.3 mL, Rfl: 0   DULoxetine (CYMBALTA) 60 MG capsule, Take 2 capsules (120 mg total) by mouth daily., Disp: 180 capsule, Rfl: 1   Fluticasone-Umeclidin-Vilant (TRELEGY ELLIPTA) 200-62.5-25 MCG/ACT AEPB, Inhale 1 puff into the lungs daily., Disp: 14 each, Rfl: 0   HYDROcodone-acetaminophen (NORCO) 10-325 MG tablet, Take 1 tablet by mouth every 8 (eight) hours as needed., Disp: 30 tablet, Rfl: 0   meclizine (ANTIVERT) 50 MG tablet, Take 1 tablet (50 mg total) by mouth 3 (three) times daily as needed., Disp: 30 tablet, Rfl: 0   meloxicam (MOBIC) 15 MG tablet, Take 1 tablet (15 mg total) by mouth daily., Disp: 30 tablet, Rfl: 0   metoprolol tartrate (LOPRESSOR) 25 MG tablet, Take 1 tablet (25 mg total) by mouth 2 (two) times daily., Disp: 180 tablet, Rfl: 3  pregabalin (LYRICA) 75 MG capsule, TAKE 1 CAPSULE(75 MG) BY MOUTH TWICE DAILY, Disp: 180 capsule, Rfl: 1   RSV vaccine recomb adjuvanted (AREXVY) 120 MCG/0.5ML injection, Inject into the muscle., Disp: 1 each, Rfl: 0   traMADol (ULTRAM) 50 MG tablet, TAKE 1 TABLET(50 MG) BY MOUTH EVERY 6 HOURS AS NEEDED, Disp: 90 tablet, Rfl: 0   Vitamin D, Ergocalciferol, (DRISDOL) 1.25 MG (50000 UNIT) CAPS capsule, Take 1 capsule (50,000 Units  total) by mouth every 7 (seven) days., Disp: 12 capsule, Rfl: 1   Objective:     Vitals:   05/24/22 1428  BP: 130/84  Pulse: (!) 55  SpO2: 96%  Weight: 250 lb (113.4 kg)  Height: '5\' 8"'$  (1.727 m)      Body mass index is 38.01 kg/m.    Physical Exam:    Gen: Appears well, nad, nontoxic and pleasant Psych: Alert and oriented, appropriate mood and affect Neuro: sensation intact, strength is 5/5 with df/pf/inv/ev, muscle tone wnl Skin: no susupicious lesions or rashes  Right ankle:  no swelling or effusion Significant pes planus with pronation NTTP over fibular head, lat mal, medial mal, achilles, navicular, base of 5th, ATFL, CFL, deltoid, calcaneous or midfoot ROM DF 30, PF 45, inv/ev intact Negative ant drawer, talar tilt, rotation test, squeeze test. Neg thompson No pain with resisted inversion or eversion  Tenderness at distal Achilles tendon with eccentric loading  Electronically signed by:  Veronica Solomon D.[facility] 2:52 PM 05/24/22

## 2022-05-24 NOTE — Patient Instructions (Addendum)
Good to see you  - Start meloxicam 15 mg daily x2 weeks.  If still having pain after 2 weeks, complete 3rd-week of meloxicam. May use remaining meloxicam as needed once daily for pain control.  Do not to use additional NSAIDs while taking meloxicam.  May use Tylenol [phone] mg 2 to 3 times a day for breakthrough pain. Ankle HEP  Pt referral  Recommend heating pad to calf muscle  4-6 week follow p

## 2022-05-26 ENCOUNTER — Telehealth: Payer: Self-pay | Admitting: Family Medicine

## 2022-05-26 DIAGNOSIS — M5134 Other intervertebral disc degeneration, thoracic region: Secondary | ICD-10-CM | POA: Diagnosis not present

## 2022-05-26 DIAGNOSIS — M9903 Segmental and somatic dysfunction of lumbar region: Secondary | ICD-10-CM | POA: Diagnosis not present

## 2022-05-26 DIAGNOSIS — M5136 Other intervertebral disc degeneration, lumbar region: Secondary | ICD-10-CM | POA: Diagnosis not present

## 2022-05-26 DIAGNOSIS — M9905 Segmental and somatic dysfunction of pelvic region: Secondary | ICD-10-CM | POA: Diagnosis not present

## 2022-05-26 DIAGNOSIS — M5431 Sciatica, right side: Secondary | ICD-10-CM | POA: Diagnosis not present

## 2022-05-26 DIAGNOSIS — M9902 Segmental and somatic dysfunction of thoracic region: Secondary | ICD-10-CM | POA: Diagnosis not present

## 2022-05-26 NOTE — Telephone Encounter (Signed)
Copied from [place] [id number]. Topic: Medicare AWV >> May 26, 2022  2:24 PM Gillis Santa wrote: Reason for CRM: [id number] PATIENT TO CALL [phone] TO SCHEDULE AWV [place]

## 2022-05-28 ENCOUNTER — Telehealth: Payer: Self-pay | Admitting: *Deleted

## 2022-05-28 NOTE — Telephone Encounter (Signed)
LMOM for pt to schedule AWV. 

## 2022-06-07 NOTE — Progress Notes (Deleted)
Synopsis: Referred for chronic bronchitis by [name], *  Subjective:   PATIENT ID: Veronica Solomon GENDER: female DOB: 01/03/1956, MRN: [id number]  No chief complaint on file.  [name] with history of smoking who is referred for chronic bronchitis who is enrolled in lung cancer screening. Has been told that she has COPD - if she has done PFTs it's been many years. Had covid twice - in July/August. Was not hospitalized but did get course of paxlovid and steroids. This is only course of steroids she's ever received and it's hard for her to recall effect that it had on her breathing.   She has DOE over last several years. Can walk on level ground half mile to a mile without issue but with incline or increased speed her exercise tolerance has worsened. She has no orthopnea. Weight has increased 30 lb over last couple of years. She has never had a blood clot. Never had asthma as a kid. Does get some exertional chest tightness, gets better when she stops.   She is taking trelegy over last 3-4 months, rinses mouth after use. She thinks she's had only minor improvement. Only minor improvement with albuterol as well.  No family history of cancer. Did have family with emphysema, COPD  Smoked for 42y. Pack per day. Quit in 2013. 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 Has lived in [place], moved to [place] in 1980. Apart from living in [place] but has had no exposure to dusts/solvents/particulates without a mask.   Interval HPI; Being worked up for LVOT obstruction with cMR, not sure if there was also suggestion of PFO on echo.   Treated for AECOPD at [facility] urgent care with azithromycin and prednisone (had finished ocurse of doxy before that)  Titrated to 17/13 BiPAP for OSA  Still taking trelegy, rinsing mouth after use.  ------------------------------------------------------------ Started on auto bipap last visit  Otherwise pertinent review of systems is  negative.  Past Medical History:  Diagnosis Date   Adenomatous colon polyp    Allergy    C. difficile colitis    Diverticulosis    Hyperlipidemia    Hypertension    IBS (irritable bowel syndrome)    Nasal congestion 06/14/1998   Neuromuscular disorder ([place])      Family History  Problem Relation Age of Onset   Hypertension Mother    Depression Mother    Heart attack Mother    Uterine cancer Mother    Diabetes Maternal Grandmother    Hypertension Paternal Grandmother      Past Surgical History:  Procedure Laterality Date   ABDOMINAL HYSTERECTOMY  06/14/2000   fibroids   PLANTAR FASCIECTOMY      Social History   Socioeconomic History   Marital status: Single    Spouse name: Not on file   Number of children: Not on file   Years of education: Not on file   Highest education level: Not on file  Occupational History   Occupation: self employed -- Herbalist  Tobacco Use   Smoking status: Former    Packs/day: 1.00    Years: 42.00    Total pack years: 42.00    Types: Cigarettes    Quit date: 12/29/2011    Years since quitting: 10.4   Smokeless tobacco: Former   Tobacco comments:    Quit 12/2011  Vaping Use   Vaping Use: Never used  Substance and Sexual Activity   Alcohol use: Yes  Comment: rarely   Drug use: No   Sexual activity: Not Currently  Other Topics Concern   Not on file  Social History Narrative   Exercise -- training dogs    Social Determinants of Health   Financial Resource Strain: Low Risk  (02/11/2021)   Overall Financial Resource Strain (CARDIA)    Difficulty of Paying Living Expenses: Not hard at all  Food Insecurity: No Food Insecurity (02/11/2021)   Hunger Vital Sign    Worried About Running Out of Food in the Last Year: Never true    Ran Out of Food in the Last Year: Never true  Transportation Needs: No Transportation Needs (02/11/2021)   PRAPARE - Hydrologist (Medical): No    Lack of Transportation  (Non-Medical): No  Physical Activity: Insufficiently Active (02/11/2021)   Exercise Vital Sign    Days of Exercise per Week: 7 days    Minutes of Exercise per Session: 20 min  Stress: Stress Concern Present (02/11/2021)   [place]    Feeling of Stress : To some extent  Social Connections: Socially Isolated (02/11/2021)   Social Connection and Isolation Panel [NHANES]    Frequency of Communication with Friends and Family: More than three times a week    Frequency of Social Gatherings with Friends and Family: More than three times a week    Attends Religious Services: Never    Marine scientist or Organizations: No    Attends Archivist Meetings: Never    Marital Status: Never married  Intimate Partner Violence: Not At Risk (02/11/2021)   Humiliation, Afraid, Rape, and Kick questionnaire    Fear of Current or Ex-Partner: No    Emotionally Abused: No    Physically Abused: No    Sexually Abused: No     Allergies  Allergen Reactions   Paregoric Hives    REACTION: HIVES   Penicillins Rash    REACTION: RASH   Procaine Hcl Other (See Comments)     Outpatient Medications Prior to Visit  Medication Sig Dispense Refill   albuterol (VENTOLIN HFA) 108 (90 Base) MCG/ACT inhaler Inhale 2 puffs into the lungs every 6 (six) hours as needed for wheezing or shortness of breath. 18 g 5   atorvastatin (LIPITOR) 20 MG tablet Take 1 tablet (20 mg total) by mouth daily. 90 tablet 1   celecoxib (CELEBREX) 200 MG capsule Take 1 capsule (200 mg total) by mouth 2 (two) times daily as needed for moderate pain. 180 capsule 3   COVID-19 mRNA vaccine 2023-2024 (COMIRNATY) syringe Inject into the muscle. 0.3 mL 0   DULoxetine (CYMBALTA) 60 MG capsule Take 2 capsules (120 mg total) by mouth daily. 180 capsule 1   Fluticasone-Umeclidin-Vilant (TRELEGY ELLIPTA) 200-62.5-25 MCG/ACT AEPB Inhale 1 puff into the lungs daily. 14 each 0    HYDROcodone-acetaminophen (NORCO) 10-325 MG tablet Take 1 tablet by mouth every 8 (eight) hours as needed. 30 tablet 0   meclizine (ANTIVERT) 50 MG tablet Take 1 tablet (50 mg total) by mouth 3 (three) times daily as needed. 30 tablet 0   meloxicam (MOBIC) 15 MG tablet Take 1 tablet (15 mg total) by mouth daily. 30 tablet 0   metoprolol tartrate (LOPRESSOR) 25 MG tablet Take 1 tablet (25 mg total) by mouth 2 (two) times daily. 180 tablet 3   pregabalin (LYRICA) 75 MG capsule TAKE 1 CAPSULE(75 MG) BY MOUTH TWICE DAILY 180 capsule 1  RSV vaccine recomb adjuvanted (AREXVY) 120 MCG/0.5ML injection Inject into the muscle. 1 each 0   traMADol (ULTRAM) 50 MG tablet TAKE 1 TABLET(50 MG) BY MOUTH EVERY 6 HOURS AS NEEDED 90 tablet 0   Vitamin D, Ergocalciferol, (DRISDOL) 1.25 MG (50000 UNIT) CAPS capsule Take 1 capsule (50,000 Units total) by mouth every 7 (seven) days. 12 capsule 1   No facility-administered medications prior to visit.       Objective:   Physical Exam:  General appearance: 66 y.o., female, NAD, conversant  Eyes: anicteric sclerae; PERRL, tracking appropriately HENT: NCAT; MMM Neck: Trachea midline; no lymphadenopathy, no JVD Lungs: CTAB, no crackles, no wheeze, with normal respiratory effort CV: RRR, murmur heard at last visit is harder to appreciate today Abdomen: Soft, non-tender; non-distended, BS present  Extremities: No peripheral edema, warm Skin: Normal turgor and texture; no rash Psych: Appropriate affect Neuro: Alert and oriented to person and place, no focal deficit     There were no vitals filed for this visit.       on RA BMI Readings from Last 3 Encounters:  05/24/22 38.01 kg/m  05/14/22 38.07 kg/m  04/16/22 37.71 kg/m   Wt Readings from Last 3 Encounters:  05/24/22 250 lb (113.4 kg)  05/14/22 250 lb 6.4 oz (113.6 kg)  04/16/22 248 lb (112.5 kg)     CBC    Component Value Date/Time   WBC 9.3 11/12/2021 1417   RBC 4.53 11/12/2021 1417    HGB 13.1 11/12/2021 1417   HCT 39.7 11/12/2021 1417   PLT 268.0 11/12/2021 1417   MCV 87.6 11/12/2021 1417   MCHC 33.1 11/12/2021 1417   RDW 13.2 11/12/2021 1417   LYMPHSABS 2.4 11/12/2021 1417   MONOABS 0.6 11/12/2021 1417   EOSABS 0.2 11/12/2021 1417   BASOSABS 0.1 11/12/2021 1417      Chest Imaging: CT Chest 07/02/20 reviewed by me remarkable for multiple stable bilateral ggo/subsolid nodules, emphysema, mild bronchial wall thickening  CT Chest 07/30/21 reviewed by me - mild emphysema and stable small nodules  Pulmonary Functions Testing Results:    Latest Ref Rng & Units 07/29/2021    1:00 PM  PFT Results  FVC-Pre L 2.70   FVC-Predicted Pre % 74   FVC-Post L 2.73   FVC-Predicted Post % 75   Pre FEV1/FVC % % 76   Post FEV1/FCV % % 77   FEV1-Pre L 2.05   FEV1-Predicted Pre % 73   FEV1-Post L 2.10   DLCO uncorrected ml/min/mmHg 19.76   DLCO UNC% % 88   DLCO corrected ml/min/mmHg 19.76   DLCO COR %Predicted % 88   DLVA Predicted % 107   TLC L 5.37   TLC % Predicted % 96   RV % Predicted % 100    Reviewed by me with mild obstruction  Home PSG with AHI of 23, nadir O2 saturation of 72%, average of 89%  CPAP titration with optimal PAP 17/13  Echocardiogram:   TTE 2015: G1DD  TTE 12/16/21:  1. Left ventricular ejection fraction, by estimation, is 60 to 65%. The  left ventricle has normal function. The left ventricle has no regional  wall motion abnormalities. Moderate focal basal septal left ventricular  hypertrophy. There was a dynamic LV  outflow tract gradient reaching 80 mmHg peak with valsalva. Left  ventricular diastolic parameters are consistent with Grade I diastolic  dysfunction (impaired relaxation).   2. Right ventricular systolic function is normal. The right ventricular  size is normal. There is  normal pulmonary artery systolic pressure. The  estimated right ventricular systolic pressure is 95.2 mmHg.   3. Left atrial size was mildly dilated.   4.  Cannot rule out PFO.   5. The mitral valve is normal in structure, no systolic anterior motion  noted. Mild mitral valve regurgitation. No evidence of mitral stenosis.   6. The aortic valve was not well visualized. Aortic valve regurgitation  is not visualized. No definite aortic stenosis is present.   7. The inferior vena cava is normal in size with greater than 50%  respiratory variability, suggesting right atrial pressure of 3 mmHg.   8. Possible hypertrophic cardiomyopathy variant.      Assessment & Plan:   # DOE # Emphysema  # Bronchial wall thickening # COPD Gold functional group B # Moderate OSA not on CPAP May be multifactorial with COPD only recently started on trelegy, ?HCM with LVOT obstruction, newly recognized sleep disordered breathing, deconditioning following covid-19 infection.  # Multiple pulmonary nodules: stable subsolid, followed in lung cancer screening program.  # Chronic hypoxemic respiratory failure: On 2L [place] with exertion and sleep. Seems to be out of proportion to her PFTs and could be related to sleep disordered breathing but there is also question of PFO on TTE.   Plan: - start auto BiPAP EPAP min 8, PS 4, max IPAP 17, full face mask - continue low dose trelegy - continues in lung cancer screening program   RTC 3 months to review auto BiPAP use   Maryjane Hurter, MD [facility] 06/07/2022 5:44 PM

## 2022-06-09 ENCOUNTER — Other Ambulatory Visit: Payer: Self-pay | Admitting: Family Medicine

## 2022-06-09 ENCOUNTER — Ambulatory Visit: Payer: Medicare PPO | Admitting: Student

## 2022-06-09 DIAGNOSIS — G894 Chronic pain syndrome: Secondary | ICD-10-CM

## 2022-06-09 MED ORDER — HYDROCODONE-ACETAMINOPHEN 10-325 MG PO TABS
1.0000 | ORAL_TABLET | Freq: Three times a day (TID) | ORAL | 0 refills | Status: DC | PRN
Start: 2022-06-09 — End: 2022-12-14

## 2022-06-09 NOTE — Telephone Encounter (Signed)
[facility]  Requesting: hydrocodone 10-'325mg'$   Contract: 05/14/22 UDS: 05/14/22 Last Visit: 05/14/22 Next Visit: 08/13/22 Last Refill: 10/14/21 #30 and 0RF   Please Advise

## 2022-06-12 ENCOUNTER — Other Ambulatory Visit: Payer: Self-pay | Admitting: Family Medicine

## 2022-06-12 DIAGNOSIS — G894 Chronic pain syndrome: Secondary | ICD-10-CM

## 2022-06-15 ENCOUNTER — Other Ambulatory Visit: Payer: Self-pay | Admitting: Family Medicine

## 2022-06-15 DIAGNOSIS — G894 Chronic pain syndrome: Secondary | ICD-10-CM

## 2022-06-15 MED ORDER — PREGABALIN 75 MG PO CAPS: ORAL_CAPSULE | ORAL | 2 refills | Status: DC

## 2022-06-15 NOTE — Progress Notes (Deleted)
Synopsis: Referred for chronic bronchitis by [name], *  Subjective:   PATIENT ID: Veronica Solomon GENDER: female DOB: 12-17-1955, MRN: [id number]  No chief complaint on file.  67yF with history of smoking who is referred for chronic bronchitis who is enrolled in lung cancer screening. Has been told that she has COPD - if she has done PFTs it's been many years. Had covid twice - in July/August. Was not hospitalized but did get course of paxlovid and steroids. This is only course of steroids she's ever received and it's hard for her to recall effect that it had on her breathing.   She has DOE over last several years. Can walk on level ground half mile to a mile without issue but with incline or increased speed her exercise tolerance has worsened. She has no orthopnea. Weight has increased 30 lb over last couple of years. She has never had a blood clot. Never had asthma as a kid. Does get some exertional chest tightness, gets better when she stops.   She is taking trelegy over last 3-4 months, rinses mouth after use. She thinks she's had only minor improvement. Only minor improvement with albuterol as well.  No family history of cancer. Did have family with emphysema, COPD  Smoked for 42y. Pack per day. Quit in 2013. 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 Has lived in [place], moved to [place] in 1980. Apart from living in [place] but has had no exposure to dusts/solvents/particulates without a mask.   Interval HPI; Being worked up for LVOT obstruction with cMR, not sure if there was also suggestion of PFO on echo.   Treated for AECOPD at [facility] urgent care with azithromycin and prednisone (had finished ocurse of doxy before that)  Titrated to 17/13 BiPAP for OSA  Still taking trelegy, rinsing mouth after use.  ------------------------------------------------------------ Started on auto bipap last visit  Otherwise pertinent review of systems is  negative.  Past Medical History:  Diagnosis Date   Adenomatous colon polyp    Allergy    C. difficile colitis    Diverticulosis    Hyperlipidemia    Hypertension    IBS (irritable bowel syndrome)    Nasal congestion 06/14/1998   Neuromuscular disorder ([place])      Family History  Problem Relation Age of Onset   Hypertension Mother    Depression Mother    Heart attack Mother    Uterine cancer Mother    Diabetes Maternal Grandmother    Hypertension Paternal Grandmother      Past Surgical History:  Procedure Laterality Date   ABDOMINAL HYSTERECTOMY  06/14/2000   fibroids   PLANTAR FASCIECTOMY      Social History   Socioeconomic History   Marital status: Single    Spouse name: Not on file   Number of children: Not on file   Years of education: Not on file   Highest education level: Not on file  Occupational History   Occupation: self employed -- Herbalist  Tobacco Use   Smoking status: Former    Packs/day: 1.00    Years: 42.00    Total pack years: 42.00    Types: Cigarettes    Quit date: 12/29/2011    Years since quitting: 10.4   Smokeless tobacco: Former   Tobacco comments:    Quit 12/2011  Vaping Use   Vaping Use: Never used  Substance and Sexual Activity   Alcohol use: Yes  Comment: rarely   Drug use: No   Sexual activity: Not Currently  Other Topics Concern   Not on file  Social History Narrative   Exercise -- training dogs    Social Determinants of Health   Financial Resource Strain: Low Risk  (02/11/2021)   Overall Financial Resource Strain (CARDIA)    Difficulty of Paying Living Expenses: Not hard at all  Food Insecurity: No Food Insecurity (02/11/2021)   Hunger Vital Sign    Worried About Running Out of Food in the Last Year: Never true    Ran Out of Food in the Last Year: Never true  Transportation Needs: No Transportation Needs (02/11/2021)   PRAPARE - Hydrologist (Medical): No    Lack of Transportation  (Non-Medical): No  Physical Activity: Insufficiently Active (02/11/2021)   Exercise Vital Sign    Days of Exercise per Week: 7 days    Minutes of Exercise per Session: 20 min  Stress: Stress Concern Present (02/11/2021)   [place]    Feeling of Stress : To some extent  Social Connections: Socially Isolated (02/11/2021)   Social Connection and Isolation Panel [NHANES]    Frequency of Communication with Friends and Family: More than three times a week    Frequency of Social Gatherings with Friends and Family: More than three times a week    Attends Religious Services: Never    Marine scientist or Organizations: No    Attends Archivist Meetings: Never    Marital Status: Never married  Intimate Partner Violence: Not At Risk (02/11/2021)   Humiliation, Afraid, Rape, and Kick questionnaire    Fear of Current or Ex-Partner: No    Emotionally Abused: No    Physically Abused: No    Sexually Abused: No     Allergies  Allergen Reactions   Paregoric Hives    REACTION: HIVES   Penicillins Rash    REACTION: RASH   Procaine Hcl Other (See Comments)     Outpatient Medications Prior to Visit  Medication Sig Dispense Refill   albuterol (VENTOLIN HFA) 108 (90 Base) MCG/ACT inhaler Inhale 2 puffs into the lungs every 6 (six) hours as needed for wheezing or shortness of breath. 18 g 5   atorvastatin (LIPITOR) 20 MG tablet Take 1 tablet (20 mg total) by mouth daily. 90 tablet 1   celecoxib (CELEBREX) 200 MG capsule Take 1 capsule (200 mg total) by mouth 2 (two) times daily as needed for moderate pain. 180 capsule 3   COVID-19 mRNA vaccine 2023-2024 (COMIRNATY) syringe Inject into the muscle. 0.3 mL 0   DULoxetine (CYMBALTA) 60 MG capsule Take 2 capsules (120 mg total) by mouth daily. 180 capsule 1   Fluticasone-Umeclidin-Vilant (TRELEGY ELLIPTA) 200-62.5-25 MCG/ACT AEPB Inhale 1 puff into the lungs daily. 14 each 0    HYDROcodone-acetaminophen (NORCO) 10-325 MG tablet Take 1 tablet by mouth every 8 (eight) hours as needed. 30 tablet 0   meclizine (ANTIVERT) 50 MG tablet Take 1 tablet (50 mg total) by mouth 3 (three) times daily as needed. 30 tablet 0   meloxicam (MOBIC) 15 MG tablet Take 1 tablet (15 mg total) by mouth daily. 30 tablet 0   metoprolol tartrate (LOPRESSOR) 25 MG tablet Take 1 tablet (25 mg total) by mouth 2 (two) times daily. 180 tablet 3   pregabalin (LYRICA) 75 MG capsule 1 po bid 180 capsule 2   RSV vaccine recomb adjuvanted (  AREXVY) 120 MCG/0.5ML injection Inject into the muscle. 1 each 0   traMADol (ULTRAM) 50 MG tablet TAKE 1 TABLET(50 MG) BY MOUTH EVERY 6 HOURS AS NEEDED 90 tablet 0   Vitamin D, Ergocalciferol, (DRISDOL) 1.25 MG (50000 UNIT) CAPS capsule Take 1 capsule (50,000 Units total) by mouth every 7 (seven) days. 12 capsule 1   No facility-administered medications prior to visit.       Objective:   Physical Exam:  General appearance: 67 y.o., female, NAD, conversant  Eyes: anicteric sclerae; PERRL, tracking appropriately HENT: NCAT; MMM Neck: Trachea midline; no lymphadenopathy, no JVD Lungs: CTAB, no crackles, no wheeze, with normal respiratory effort CV: RRR, murmur heard at last visit is harder to appreciate today Abdomen: Soft, non-tender; non-distended, BS present  Extremities: No peripheral edema, warm Skin: Normal turgor and texture; no rash Psych: Appropriate affect Neuro: Alert and oriented to person and place, no focal deficit     There were no vitals filed for this visit.       on RA BMI Readings from Last 3 Encounters:  05/24/22 38.01 kg/m  05/14/22 38.07 kg/m  04/16/22 37.71 kg/m   Wt Readings from Last 3 Encounters:  05/24/22 250 lb (113.4 kg)  05/14/22 250 lb 6.4 oz (113.6 kg)  04/16/22 248 lb (112.5 kg)     CBC    Component Value Date/Time   WBC 9.3 11/12/2021 1417   RBC 4.53 11/12/2021 1417   HGB 13.1 11/12/2021 1417   HCT  39.7 11/12/2021 1417   PLT 268.0 11/12/2021 1417   MCV 87.6 11/12/2021 1417   MCHC 33.1 11/12/2021 1417   RDW 13.2 11/12/2021 1417   LYMPHSABS 2.4 11/12/2021 1417   MONOABS 0.6 11/12/2021 1417   EOSABS 0.2 11/12/2021 1417   BASOSABS 0.1 11/12/2021 1417      Chest Imaging: CT Chest 07/02/20 reviewed by me remarkable for multiple stable bilateral ggo/subsolid nodules, emphysema, mild bronchial wall thickening  CT Chest 07/30/21 reviewed by me - mild emphysema and stable small nodules  Pulmonary Functions Testing Results:    Latest Ref Rng & Units 07/29/2021    1:00 PM  PFT Results  FVC-Pre L 2.70   FVC-Predicted Pre % 74   FVC-Post L 2.73   FVC-Predicted Post % 75   Pre FEV1/FVC % % 76   Post FEV1/FCV % % 77   FEV1-Pre L 2.05   FEV1-Predicted Pre % 73   FEV1-Post L 2.10   DLCO uncorrected ml/min/mmHg 19.76   DLCO UNC% % 88   DLCO corrected ml/min/mmHg 19.76   DLCO COR %Predicted % 88   DLVA Predicted % 107   TLC L 5.37   TLC % Predicted % 96   RV % Predicted % 100    Reviewed by me with mild obstruction  Home PSG with AHI of 23, nadir O2 saturation of 72%, average of 89%  CPAP titration with optimal PAP 17/13  Echocardiogram:   TTE 2015: G1DD  TTE 12/16/21:  1. Left ventricular ejection fraction, by estimation, is 60 to 65%. The  left ventricle has normal function. The left ventricle has no regional  wall motion abnormalities. Moderate focal basal septal left ventricular  hypertrophy. There was a dynamic LV  outflow tract gradient reaching 80 mmHg peak with valsalva. Left  ventricular diastolic parameters are consistent with Grade I diastolic  dysfunction (impaired relaxation).   2. Right ventricular systolic function is normal. The right ventricular  size is normal. There is normal pulmonary artery systolic  pressure. The  estimated right ventricular systolic pressure is 43.1 mmHg.   3. Left atrial size was mildly dilated.   4. Cannot rule out PFO.   5. The  mitral valve is normal in structure, no systolic anterior motion  noted. Mild mitral valve regurgitation. No evidence of mitral stenosis.   6. The aortic valve was not well visualized. Aortic valve regurgitation  is not visualized. No definite aortic stenosis is present.   7. The inferior vena cava is normal in size with greater than 50%  respiratory variability, suggesting right atrial pressure of 3 mmHg.   8. Possible hypertrophic cardiomyopathy variant.      Assessment & Plan:   # DOE # Emphysema  # Bronchial wall thickening # COPD Gold functional group B # Moderate OSA not on CPAP May be multifactorial with COPD only recently started on trelegy, ?HCM with LVOT obstruction, newly recognized sleep disordered breathing, deconditioning following covid-19 infection.  # Multiple pulmonary nodules: stable subsolid, followed in lung cancer screening program.  # Chronic hypoxemic respiratory failure: On 2L [place] with exertion and sleep. Seems to be out of proportion to her PFTs and could be related to sleep disordered breathing but there is also question of PFO on TTE.   Plan: - start auto BiPAP EPAP min 8, PS 4, max IPAP 17, full face mask - continue low dose trelegy - continues in lung cancer screening program   RTC 3 months to review auto BiPAP use   Maryjane Hurter, MD [facility] 06/15/2022 5:44 PM

## 2022-06-16 ENCOUNTER — Ambulatory Visit: Payer: Medicare PPO | Admitting: Student

## 2022-06-17 ENCOUNTER — Telehealth: Payer: Medicare PPO | Admitting: Family Medicine

## 2022-06-17 DIAGNOSIS — H44003 Unspecified purulent endophthalmitis, bilateral: Secondary | ICD-10-CM

## 2022-06-17 MED ORDER — POLYMYXIN B-TRIMETHOPRIM 10000-0.1 UNIT/ML-% OP SOLN: 1.0000 [drp] | Freq: Four times a day (QID) | OPHTHALMIC | 0 refills | Status: DC

## 2022-06-17 NOTE — Progress Notes (Signed)

## 2022-06-18 NOTE — Progress Notes (Unsigned)
Benito Mccreedy D.[place] [facility] Phone: [phone]   Assessment and Plan:     There are no diagnoses linked to this encounter.  ***   Pertinent previous records reviewed include ***   Follow Up: ***     Subjective:   I, Beila Purdie, am serving as a Education administrator for Doctor Glennon Mac   Chief Complaint: right achilles pain    HPI:    05/24/22 Patient is a 67 year old female complaining of right achilles pain. Patient states that appeared on 05/11/2022. She is not sure what caused the symptom to arise. She states that it hurts to walk and hurts to bend the joint. She has been icing the area and keeping it elevated but symptoms are prevalent. Pain sometimes radiates to the heel of her foot , no MOI, no noticeable bumps , no numbness or tingling, Celebrex , tramadol, and hydrocodone and those dont help   06/21/2022 Patient states    Relevant Historical Information: Hypertension, CAD  Additional pertinent review of systems negative.   Current Outpatient Medications:    albuterol (VENTOLIN HFA) 108 (90 Base) MCG/ACT inhaler, Inhale 2 puffs into the lungs every 6 (six) hours as needed for wheezing or shortness of breath., Disp: 18 g, Rfl: 5   atorvastatin (LIPITOR) 20 MG tablet, Take 1 tablet (20 mg total) by mouth daily., Disp: 90 tablet, Rfl: 1   celecoxib (CELEBREX) 200 MG capsule, Take 1 capsule (200 mg total) by mouth 2 (two) times daily as needed for moderate pain., Disp: 180 capsule, Rfl: 3   COVID-19 mRNA vaccine 2023-2024 (COMIRNATY) syringe, Inject into the muscle., Disp: 0.3 mL, Rfl: 0   DULoxetine (CYMBALTA) 60 MG capsule, Take 2 capsules (120 mg total) by mouth daily., Disp: 180 capsule, Rfl: 1   Fluticasone-Umeclidin-Vilant (TRELEGY ELLIPTA) 200-62.5-25 MCG/ACT AEPB, Inhale 1 puff into the lungs daily., Disp: 14 each, Rfl: 0   HYDROcodone-acetaminophen (NORCO) 10-325 MG tablet, Take 1 tablet by  mouth every 8 (eight) hours as needed., Disp: 30 tablet, Rfl: 0   meclizine (ANTIVERT) 50 MG tablet, Take 1 tablet (50 mg total) by mouth 3 (three) times daily as needed., Disp: 30 tablet, Rfl: 0   meloxicam (MOBIC) 15 MG tablet, Take 1 tablet (15 mg total) by mouth daily., Disp: 30 tablet, Rfl: 0   metoprolol tartrate (LOPRESSOR) 25 MG tablet, Take 1 tablet (25 mg total) by mouth 2 (two) times daily., Disp: 180 tablet, Rfl: 3   pregabalin (LYRICA) 75 MG capsule, 1 po bid, Disp: 180 capsule, Rfl: 2   RSV vaccine recomb adjuvanted (AREXVY) 120 MCG/0.5ML injection, Inject into the muscle., Disp: 1 each, Rfl: 0   traMADol (ULTRAM) 50 MG tablet, TAKE 1 TABLET(50 MG) BY MOUTH EVERY 6 HOURS AS NEEDED, Disp: 90 tablet, Rfl: 0   trimethoprim-polymyxin b (POLYTRIM) ophthalmic solution, Place 1 drop into the right eye in the morning, at noon, in the evening, and at bedtime., Disp: 10 mL, Rfl: 0   Vitamin D, Ergocalciferol, (DRISDOL) 1.25 MG (50000 UNIT) CAPS capsule, Take 1 capsule (50,000 Units total) by mouth every 7 (seven) days., Disp: 12 capsule, Rfl: 1   Objective:     There were no vitals filed for this visit.    There is no height or weight on file to calculate BMI.    Physical Exam:    ***   Electronically signed by:  Benito Mccreedy D.[facility] 8:42 AM  06/18/22 

## 2022-06-19 ENCOUNTER — Telehealth: Payer: Medicare PPO | Admitting: Nurse Practitioner

## 2022-06-19 DIAGNOSIS — R051 Acute cough: Secondary | ICD-10-CM

## 2022-06-19 MED ORDER — AZITHROMYCIN 250 MG PO TABS: ORAL_TABLET | ORAL | 0 refills | Status: DC

## 2022-06-19 NOTE — Progress Notes (Signed)
We are sorry that you are not feeling well.  Here is how we plan to help!  Based on your presentation I believe you most likely have A cough due to bacteria.  When patients have a fever and a productive cough with a change in color or increased sputum production, we are concerned about bacterial bronchitis.  If left untreated it can progress to pneumonia.  If your symptoms do not improve with your treatment plan it is important that you contact your provider.   I have prescribed Azithromyin 250 mg: two tablets now and then one tablet daily for 4 additonal days    In addition you may use mucinex OTC  From your responses in the eVisit questionnaire you describe inflammation in the upper respiratory tract which is causing a significant cough.  This is commonly called Bronchitis and has four common causes:   Allergies Viral Infections Acid Reflux Bacterial Infection Allergies, viruses and acid reflux are treated by controlling symptoms or eliminating the cause. An example might be a cough caused by taking certain blood pressure medications. You stop the cough by changing the medication. Another example might be a cough caused by acid reflux. Controlling the reflux helps control the cough.  USE OF BRONCHODILATOR ("RESCUE") INHALERS: There is a risk from using your bronchodilator too frequently.  The risk is that over-reliance on a medication which only relaxes the muscles surrounding the breathing tubes can reduce the effectiveness of medications prescribed to reduce swelling and congestion of the tubes themselves.  Although you feel brief relief from the bronchodilator inhaler, your asthma may actually be worsening with the tubes becoming more swollen and filled with mucus.  This can delay other crucial treatments, such as oral steroid medications. If you need to use a bronchodilator inhaler daily, several times per day, you should discuss this with your provider.  There are probably better treatments that  could be used to keep your asthma under control.     HOME CARE Only take medications as instructed by your medical team. Complete the entire course of an antibiotic. Drink plenty of fluids and get plenty of rest. Avoid close contacts especially the very young and the elderly Cover your mouth if you cough or cough into your sleeve. Always remember to wash your hands A steam or ultrasonic humidifier can help congestion.   GET HELP RIGHT AWAY IF: You develop worsening fever. You become short of breath You cough up blood. Your symptoms persist after you have completed your treatment plan MAKE SURE YOU  Understand these instructions. Will watch your condition. Will get help right away if you are not doing well or get worse.    Thank you for choosing an e-visit.  Your e-visit answers were reviewed by a board certified advanced clinical practitioner to complete your personal care plan. Depending upon the condition, your plan could have included both over the counter or prescription medications.  Please review your pharmacy choice. Make sure the pharmacy is open so you can pick up prescription now. If there is a problem, you may contact your provider through [employer] and have the prescription routed to another pharmacy.  Your safety is important to [place]. If you have drug allergies check your prescription carefully.   For the next 24 hours you can use MyChart to ask questions about today's visit, request a non-urgent call back, or ask for a work or school excuse. You will get an email in the next two days asking about your experience.  I hope that your e-visit has been valuable and will speed your recovery.  Mary-Margaret Hassell Done, FNP   5-10 minutes spent reviewing and documenting in chart.

## 2022-06-20 ENCOUNTER — Other Ambulatory Visit: Payer: Self-pay | Admitting: Sports Medicine

## 2022-06-21 ENCOUNTER — Ambulatory Visit (INDEPENDENT_AMBULATORY_CARE_PROVIDER_SITE_OTHER): Payer: Medicare PPO | Admitting: Sports Medicine

## 2022-06-21 VITALS — HR 71 | Ht 68.0 in | Wt 250.0 lb

## 2022-06-21 DIAGNOSIS — M25571 Pain in right ankle and joints of right foot: Secondary | ICD-10-CM

## 2022-06-21 DIAGNOSIS — G8929 Other chronic pain: Secondary | ICD-10-CM | POA: Diagnosis not present

## 2022-06-21 DIAGNOSIS — M7661 Achilles tendinitis, right leg: Secondary | ICD-10-CM | POA: Diagnosis not present

## 2022-06-23 DIAGNOSIS — M5136 Other intervertebral disc degeneration, lumbar region: Secondary | ICD-10-CM | POA: Diagnosis not present

## 2022-06-23 DIAGNOSIS — M9905 Segmental and somatic dysfunction of pelvic region: Secondary | ICD-10-CM | POA: Diagnosis not present

## 2022-06-23 DIAGNOSIS — M5134 Other intervertebral disc degeneration, thoracic region: Secondary | ICD-10-CM | POA: Diagnosis not present

## 2022-06-23 DIAGNOSIS — M9902 Segmental and somatic dysfunction of thoracic region: Secondary | ICD-10-CM | POA: Diagnosis not present

## 2022-06-23 DIAGNOSIS — M9903 Segmental and somatic dysfunction of lumbar region: Secondary | ICD-10-CM | POA: Diagnosis not present

## 2022-06-23 DIAGNOSIS — M5431 Sciatica, right side: Secondary | ICD-10-CM | POA: Diagnosis not present

## 2022-06-30 DIAGNOSIS — Y999 Unspecified external cause status: Secondary | ICD-10-CM | POA: Diagnosis not present

## 2022-06-30 DIAGNOSIS — W540XXA Bitten by dog, initial encounter: Secondary | ICD-10-CM | POA: Diagnosis not present

## 2022-06-30 DIAGNOSIS — M25462 Effusion, left knee: Secondary | ICD-10-CM | POA: Diagnosis not present

## 2022-06-30 DIAGNOSIS — M1712 Unilateral primary osteoarthritis, left knee: Secondary | ICD-10-CM | POA: Diagnosis not present

## 2022-06-30 DIAGNOSIS — M7989 Other specified soft tissue disorders: Secondary | ICD-10-CM | POA: Diagnosis not present

## 2022-06-30 DIAGNOSIS — S81052A Open bite, left knee, initial encounter: Secondary | ICD-10-CM | POA: Diagnosis not present

## 2022-06-30 DIAGNOSIS — Z23 Encounter for immunization: Secondary | ICD-10-CM | POA: Diagnosis not present

## 2022-07-11 DIAGNOSIS — G4733 Obstructive sleep apnea (adult) (pediatric): Secondary | ICD-10-CM | POA: Diagnosis not present

## 2022-07-11 DIAGNOSIS — J9621 Acute and chronic respiratory failure with hypoxia: Secondary | ICD-10-CM | POA: Diagnosis not present

## 2022-07-14 NOTE — Progress Notes (Unsigned)
Synopsis: Referred for chronic bronchitis by [name], *  Subjective:   PATIENT ID: Veronica Solomon GENDER: female DOB: Mar 10, 1956, MRN: [id number]  No chief complaint on file.  67yF with history of smoking who is referred for chronic bronchitis who is enrolled in lung cancer screening. Has been told that she has COPD - if she has done PFTs it's been many years. Had covid twice - in July/August. Was not hospitalized but did get course of paxlovid and steroids. This is only course of steroids she's ever received and it's hard for her to recall effect that it had on her breathing.   She has DOE over last several years. Can walk on level ground half mile to a mile without issue but with incline or increased speed her exercise tolerance has worsened. She has no orthopnea. Weight has increased 30 lb over last couple of years. She has never had a blood clot. Never had asthma as a kid. Does get some exertional chest tightness, gets better when she stops.   She is taking trelegy over last 3-4 months, rinses mouth after use. She thinks she's had only minor improvement. Only minor improvement with albuterol as well.  No family history of cancer. Did have family with emphysema, COPD  Smoked for 42y. Pack per day. Quit in 2013. 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 Has lived in [place], moved to [place] in 1980. Apart from living in [place] but has had no exposure to dusts/solvents/particulates without a mask.   Interval HPI; Being worked up for LVOT obstruction with cMR, not sure if there was also suggestion of PFO on echo.   Treated for AECOPD at [facility] urgent care with azithromycin and prednisone (had finished ocurse of doxy before that)  Titrated to 17/13 BiPAP for OSA  Still taking trelegy, rinsing mouth after use.  ------------------------------------------------------------ Started on auto bipap last visit  Otherwise pertinent review of systems is  negative.  Past Medical History:  Diagnosis Date   Adenomatous colon polyp    Allergy    C. difficile colitis    Diverticulosis    Hyperlipidemia    Hypertension    IBS (irritable bowel syndrome)    Nasal congestion 06/14/1998   Neuromuscular disorder ([place])      Family History  Problem Relation Age of Onset   Hypertension Mother    Depression Mother    Heart attack Mother    Uterine cancer Mother    Diabetes Maternal Grandmother    Hypertension Paternal Grandmother      Past Surgical History:  Procedure Laterality Date   ABDOMINAL HYSTERECTOMY  06/14/2000   fibroids   PLANTAR FASCIECTOMY      Social History   Socioeconomic History   Marital status: Single    Spouse name: Not on file   Number of children: Not on file   Years of education: Not on file   Highest education level: Not on file  Occupational History   Occupation: self employed -- Herbalist  Tobacco Use   Smoking status: Former    Packs/day: 1.00    Years: 42.00    Total pack years: 42.00    Types: Cigarettes    Quit date: 12/29/2011    Years since quitting: 10.5   Smokeless tobacco: Former   Tobacco comments:    Quit 12/2011  Vaping Use   Vaping Use: Never used  Substance and Sexual Activity   Alcohol use: Yes  Comment: rarely   Drug use: No   Sexual activity: Not Currently  Other Topics Concern   Not on file  Social History Narrative   Exercise -- training dogs    Social Determinants of Health   Financial Resource Strain: Low Risk  (02/11/2021)   Overall Financial Resource Strain (CARDIA)    Difficulty of Paying Living Expenses: Not hard at all  Food Insecurity: No Food Insecurity (02/11/2021)   Hunger Vital Sign    Worried About Running Out of Food in the Last Year: Never true    Ran Out of Food in the Last Year: Never true  Transportation Needs: No Transportation Needs (02/11/2021)   PRAPARE - Hydrologist (Medical): No    Lack of Transportation  (Non-Medical): No  Physical Activity: Insufficiently Active (02/11/2021)   Exercise Vital Sign    Days of Exercise per Week: 7 days    Minutes of Exercise per Session: 20 min  Stress: Stress Concern Present (02/11/2021)   [place]    Feeling of Stress : To some extent  Social Connections: Socially Isolated (02/11/2021)   Social Connection and Isolation Panel [NHANES]    Frequency of Communication with Friends and Family: More than three times a week    Frequency of Social Gatherings with Friends and Family: More than three times a week    Attends Religious Services: Never    Marine scientist or Organizations: No    Attends Archivist Meetings: Never    Marital Status: Never married  Intimate Partner Violence: Not At Risk (02/11/2021)   Humiliation, Afraid, Rape, and Kick questionnaire    Fear of Current or Ex-Partner: No    Emotionally Abused: No    Physically Abused: No    Sexually Abused: No     Allergies  Allergen Reactions   Paregoric Hives    REACTION: HIVES   Penicillins Rash    REACTION: RASH   Procaine Hcl Other (See Comments)     Outpatient Medications Prior to Visit  Medication Sig Dispense Refill   albuterol (VENTOLIN HFA) 108 (90 Base) MCG/ACT inhaler Inhale 2 puffs into the lungs every 6 (six) hours as needed for wheezing or shortness of breath. 18 g 5   atorvastatin (LIPITOR) 20 MG tablet Take 1 tablet (20 mg total) by mouth daily. 90 tablet 1   azithromycin (ZITHROMAX Z-PAK) 250 MG tablet As directed 6 tablet 0   celecoxib (CELEBREX) 200 MG capsule Take 1 capsule (200 mg total) by mouth 2 (two) times daily as needed for moderate pain. 180 capsule 3   COVID-19 mRNA vaccine 2023-2024 (COMIRNATY) syringe Inject into the muscle. 0.3 mL 0   DULoxetine (CYMBALTA) 60 MG capsule Take 2 capsules (120 mg total) by mouth daily. 180 capsule 1   Fluticasone-Umeclidin-Vilant (TRELEGY ELLIPTA)  200-62.5-25 MCG/ACT AEPB Inhale 1 puff into the lungs daily. 14 each 0   HYDROcodone-acetaminophen (NORCO) 10-325 MG tablet Take 1 tablet by mouth every 8 (eight) hours as needed. 30 tablet 0   meclizine (ANTIVERT) 50 MG tablet Take 1 tablet (50 mg total) by mouth 3 (three) times daily as needed. 30 tablet 0   meloxicam (MOBIC) 15 MG tablet Take 1 tablet (15 mg total) by mouth daily. 30 tablet 0   metoprolol tartrate (LOPRESSOR) 25 MG tablet Take 1 tablet (25 mg total) by mouth 2 (two) times daily. 180 tablet 3   pregabalin (LYRICA) 75 MG  capsule 1 po bid 180 capsule 2   RSV vaccine recomb adjuvanted (AREXVY) 120 MCG/0.5ML injection Inject into the muscle. 1 each 0   traMADol (ULTRAM) 50 MG tablet TAKE 1 TABLET(50 MG) BY MOUTH EVERY 6 HOURS AS NEEDED 90 tablet 0   trimethoprim-polymyxin b (POLYTRIM) ophthalmic solution Place 1 drop into the right eye in the morning, at noon, in the evening, and at bedtime. 10 mL 0   Vitamin D, Ergocalciferol, (DRISDOL) 1.25 MG (50000 UNIT) CAPS capsule Take 1 capsule (50,000 Units total) by mouth every 7 (seven) days. 12 capsule 1   No facility-administered medications prior to visit.       Objective:   Physical Exam:  General appearance: 67 y.o., female, NAD, conversant  Eyes: anicteric sclerae; PERRL, tracking appropriately HENT: NCAT; MMM Neck: Trachea midline; no lymphadenopathy, no JVD Lungs: CTAB, no crackles, no wheeze, with normal respiratory effort CV: RRR, murmur heard at last visit is harder to appreciate today Abdomen: Soft, non-tender; non-distended, BS present  Extremities: No peripheral edema, warm Skin: Normal turgor and texture; no rash Psych: Appropriate affect Neuro: Alert and oriented to person and place, no focal deficit     There were no vitals filed for this visit.       on RA BMI Readings from Last 3 Encounters:  06/21/22 38.01 kg/m  05/24/22 38.01 kg/m  05/14/22 38.07 kg/m   Wt Readings from Last 3 Encounters:   06/21/22 250 lb (113.4 kg)  05/24/22 250 lb (113.4 kg)  05/14/22 250 lb 6.4 oz (113.6 kg)     CBC    Component Value Date/Time   WBC 9.3 11/12/2021 1417   RBC 4.53 11/12/2021 1417   HGB 13.1 11/12/2021 1417   HCT 39.7 11/12/2021 1417   PLT 268.0 11/12/2021 1417   MCV 87.6 11/12/2021 1417   MCHC 33.1 11/12/2021 1417   RDW 13.2 11/12/2021 1417   LYMPHSABS 2.4 11/12/2021 1417   MONOABS 0.6 11/12/2021 1417   EOSABS 0.2 11/12/2021 1417   BASOSABS 0.1 11/12/2021 1417      Chest Imaging: CT Chest 07/02/20 reviewed by me remarkable for multiple stable bilateral ggo/subsolid nodules, emphysema, mild bronchial wall thickening  CT Chest 07/30/21 reviewed by me - mild emphysema and stable small nodules  Pulmonary Functions Testing Results:    Latest Ref Rng & Units 07/29/2021    1:00 PM  PFT Results  FVC-Pre L 2.70   FVC-Predicted Pre % 74   FVC-Post L 2.73   FVC-Predicted Post % 75   Pre FEV1/FVC % % 76   Post FEV1/FCV % % 77   FEV1-Pre L 2.05   FEV1-Predicted Pre % 73   FEV1-Post L 2.10   DLCO uncorrected ml/min/mmHg 19.76   DLCO UNC% % 88   DLCO corrected ml/min/mmHg 19.76   DLCO COR %Predicted % 88   DLVA Predicted % 107   TLC L 5.37   TLC % Predicted % 96   RV % Predicted % 100    Reviewed by me with mild obstruction  Home PSG with AHI of 23, nadir O2 saturation of 72%, average of 89%  CPAP titration with optimal PAP 17/13  Echocardiogram:   TTE 2015: G1DD  TTE 12/16/21:  1. Left ventricular ejection fraction, by estimation, is 60 to 65%. The  left ventricle has normal function. The left ventricle has no regional  wall motion abnormalities. Moderate focal basal septal left ventricular  hypertrophy. There was a dynamic LV  outflow tract gradient reaching 80  mmHg peak with valsalva. Left  ventricular diastolic parameters are consistent with Grade I diastolic  dysfunction (impaired relaxation).   2. Right ventricular systolic function is normal. The right  ventricular  size is normal. There is normal pulmonary artery systolic pressure. The  estimated right ventricular systolic pressure is 60.0 mmHg.   3. Left atrial size was mildly dilated.   4. Cannot rule out PFO.   5. The mitral valve is normal in structure, no systolic anterior motion  noted. Mild mitral valve regurgitation. No evidence of mitral stenosis.   6. The aortic valve was not well visualized. Aortic valve regurgitation  is not visualized. No definite aortic stenosis is present.   7. The inferior vena cava is normal in size with greater than 50%  respiratory variability, suggesting right atrial pressure of 3 mmHg.   8. Possible hypertrophic cardiomyopathy variant.      Assessment & Plan:   # DOE # Emphysema  # Bronchial wall thickening # COPD Gold functional group B # Moderate OSA not on CPAP May be multifactorial with COPD only recently started on trelegy, ?HCM with LVOT obstruction, newly recognized sleep disordered breathing, deconditioning following covid-19 infection.  # Multiple pulmonary nodules: stable subsolid, followed in lung cancer screening program.  # Chronic hypoxemic respiratory failure: On 2L [place] with exertion and sleep. Seems to be out of proportion to her PFTs and could be related to sleep disordered breathing but there is also question of PFO on TTE.   Plan: - start auto BiPAP EPAP min 8, PS 4, max IPAP 17, full face mask - continue low dose trelegy - continues in lung cancer screening program   RTC 3 months to review auto BiPAP use   Maryjane Hurter, MD [facility] 07/14/2022 1:16 PM

## 2022-07-15 ENCOUNTER — Encounter: Payer: Self-pay | Admitting: Student

## 2022-07-15 ENCOUNTER — Ambulatory Visit: Payer: Medicare PPO | Admitting: Student

## 2022-07-15 VITALS — BP 130/68 | HR 63 | Temp 98.2°F | Ht 68.0 in | Wt 250.0 lb

## 2022-07-15 DIAGNOSIS — J449 Chronic obstructive pulmonary disease, unspecified: Secondary | ICD-10-CM | POA: Diagnosis not present

## 2022-07-15 DIAGNOSIS — G4733 Obstructive sleep apnea (adult) (pediatric): Secondary | ICD-10-CM | POA: Diagnosis not present

## 2022-07-15 NOTE — Patient Instructions (Addendum)
-  would suggest discussing medications to support weight loss like ozempic, wegovy with your PCP. IF we want to consider Inspire device for OSA then would target a weight of 230 lb for medicare to cover the procedure.  - send my chart message or call if you have any questions about results of your upcoming CT Chest - See you in 6 months or sooner if need be

## 2022-07-21 DIAGNOSIS — M9902 Segmental and somatic dysfunction of thoracic region: Secondary | ICD-10-CM | POA: Diagnosis not present

## 2022-07-21 DIAGNOSIS — M5136 Other intervertebral disc degeneration, lumbar region: Secondary | ICD-10-CM | POA: Diagnosis not present

## 2022-07-21 DIAGNOSIS — M9905 Segmental and somatic dysfunction of pelvic region: Secondary | ICD-10-CM | POA: Diagnosis not present

## 2022-07-21 DIAGNOSIS — M5431 Sciatica, right side: Secondary | ICD-10-CM | POA: Diagnosis not present

## 2022-07-21 DIAGNOSIS — M5134 Other intervertebral disc degeneration, thoracic region: Secondary | ICD-10-CM | POA: Diagnosis not present

## 2022-07-21 DIAGNOSIS — M9903 Segmental and somatic dysfunction of lumbar region: Secondary | ICD-10-CM | POA: Diagnosis not present

## 2022-07-23 DIAGNOSIS — G4733 Obstructive sleep apnea (adult) (pediatric): Secondary | ICD-10-CM | POA: Diagnosis not present

## 2022-07-30 ENCOUNTER — Ambulatory Visit
Admission: RE | Admit: 2022-07-30 | Discharge: 2022-07-30 | Disposition: A | Discharge status: Home / Self Care | Payer: Medicare PPO | Source: Ambulatory Visit | Attending: Family Medicine | Admitting: Family Medicine

## 2022-07-30 DIAGNOSIS — Z87891 Personal history of nicotine dependence: Secondary | ICD-10-CM | POA: Diagnosis not present

## 2022-08-11 DIAGNOSIS — G4733 Obstructive sleep apnea (adult) (pediatric): Secondary | ICD-10-CM | POA: Diagnosis not present

## 2022-08-11 DIAGNOSIS — J9621 Acute and chronic respiratory failure with hypoxia: Secondary | ICD-10-CM | POA: Diagnosis not present

## 2022-08-12 ENCOUNTER — Other Ambulatory Visit: Payer: Self-pay

## 2022-08-12 DIAGNOSIS — Z87891 Personal history of nicotine dependence: Secondary | ICD-10-CM

## 2022-08-12 DIAGNOSIS — Z122 Encounter for screening for malignant neoplasm of respiratory organs: Secondary | ICD-10-CM

## 2022-08-13 ENCOUNTER — Ambulatory Visit: Payer: Medicare PPO | Admitting: Family Medicine

## 2022-08-13 ENCOUNTER — Encounter: Payer: Self-pay | Admitting: Family Medicine

## 2022-08-13 VITALS — BP 134/80 | HR 60 | Temp 97.8°F | Resp 18 | Ht 68.0 in | Wt 252.0 lb

## 2022-08-13 DIAGNOSIS — E785 Hyperlipidemia, unspecified: Secondary | ICD-10-CM

## 2022-08-13 DIAGNOSIS — J9611 Chronic respiratory failure with hypoxia: Secondary | ICD-10-CM | POA: Diagnosis not present

## 2022-08-13 DIAGNOSIS — E538 Deficiency of other specified B group vitamins: Secondary | ICD-10-CM

## 2022-08-13 DIAGNOSIS — G43809 Other migraine, not intractable, without status migrainosus: Secondary | ICD-10-CM | POA: Diagnosis not present

## 2022-08-13 DIAGNOSIS — E559 Vitamin D deficiency, unspecified: Secondary | ICD-10-CM

## 2022-08-13 DIAGNOSIS — F321 Major depressive disorder, single episode, moderate: Secondary | ICD-10-CM | POA: Insufficient documentation

## 2022-08-13 DIAGNOSIS — I421 Obstructive hypertrophic cardiomyopathy: Secondary | ICD-10-CM | POA: Diagnosis not present

## 2022-08-13 DIAGNOSIS — I10 Essential (primary) hypertension: Secondary | ICD-10-CM | POA: Diagnosis not present

## 2022-08-13 DIAGNOSIS — F320 Major depressive disorder, single episode, mild: Secondary | ICD-10-CM | POA: Diagnosis not present

## 2022-08-13 MED ORDER — KETOROLAC TROMETHAMINE 60 MG/2ML IM SOLN
60.0000 mg | Freq: Once | INTRAMUSCULAR | Status: AC
  Administered 2022-08-13: 60 mg via INTRAMUSCULAR

## 2022-08-13 NOTE — Progress Notes (Signed)
Subjective:   By signing my name below, I, Veronica Solomon, attest that this documentation has been prepared under the direction and in the presence of Veronica Held, DO 08/13/22   Patient ID: Veronica Solomon, female    DOB: [birth year], 67 y.o.   MRN: [id number]  Chief Complaint  Patient presents with   Hypertension   Hyperlipidemia   Follow-up    HPI Patient is in today for a 3 month follow up. She is overall well.   She reports an incident on 06/30/2022 where she was bit in the left knee by a dog while at work. She trains dogs for work. She states it was a level 4 bite and the dog bit her 3 times. She has a large bruise on her knee. She reports she was the 3rd person the dog bit.   She complains of a persistent temporal headache for the past 2 weeks. She notes that it has not gone away or gotten better. She states it is not severe but constant. She has been taking Celebrex and Meloxicam.   She also complains of severe fatigue. She continues to take Vitamin D once a week but has stopped taking B12. Her CPAP mask off at times bothers making it difficult to sleep. She tends to take the mask off when it gets uncomfortable.   She has been struggling to lose weight and is interested in her options.   Past Medical History:  Diagnosis Date   Adenomatous colon polyp    Allergy    C. difficile colitis    Diverticulosis    Hyperlipidemia    Hypertension    IBS (irritable bowel syndrome)    Nasal congestion 06/14/1998   Neuromuscular disorder ([place])     Past Surgical History:  Procedure Laterality Date   ABDOMINAL HYSTERECTOMY  06/14/2000   fibroids   PLANTAR FASCIECTOMY      Family History  Problem Relation Age of Onset   Hypertension Mother    Depression Mother    Heart attack Mother    Uterine cancer Mother    Diabetes Maternal Grandmother    Hypertension Paternal Grandmother     Social History   Socioeconomic History   Marital status: Single    Spouse  name: Not on file   Number of children: Not on file   Years of education: Not on file   Highest education level: Not on file  Occupational History   Occupation: self employed -- Herbalist  Tobacco Use   Smoking status: Former    Packs/day: 1.00    Years: 42.00    Total pack years: 42.00    Types: Cigarettes    Quit date: 12/29/2011    Years since quitting: 10.6   Smokeless tobacco: Former   Tobacco comments:    Quit 12/2011  Vaping Use   Vaping Use: Never used  Substance and Sexual Activity   Alcohol use: Yes    Comment: rarely   Drug use: No   Sexual activity: Not Currently  Other Topics Concern   Not on file  Social History Narrative   Exercise -- training dogs    Social Determinants of Health   Financial Resource Strain: Low Risk  (02/11/2021)   Overall Financial Resource Strain (CARDIA)    Difficulty of Paying Living Expenses: Not hard at all  Food Insecurity: No Food Insecurity (02/11/2021)   Hunger Vital Sign    Worried About Running Out of Food in the Last Year: Never  true    Ran Out of Food in the Last Year: Never true  Transportation Needs: No Transportation Needs (02/11/2021)   PRAPARE - Hydrologist (Medical): No    Lack of Transportation (Non-Medical): No  Physical Activity: Insufficiently Active (02/11/2021)   Exercise Vital Sign    Days of Exercise per Week: 7 days    Minutes of Exercise per Session: 20 min  Stress: Stress Concern Present (02/11/2021)   [place]    Feeling of Stress : To some extent  Social Connections: Socially Isolated (02/11/2021)   Social Connection and Isolation Panel [NHANES]    Frequency of Communication with Friends and Family: More than three times a week    Frequency of Social Gatherings with Friends and Family: More than three times a week    Attends Religious Services: Never    Marine scientist or Organizations: No     Attends Archivist Meetings: Never    Marital Status: Never married  Intimate Partner Violence: Not At Risk (02/11/2021)   Humiliation, Afraid, Rape, and Kick questionnaire    Fear of Current or Ex-Partner: No    Emotionally Abused: No    Physically Abused: No    Sexually Abused: No    Outpatient Medications Prior to Visit  Medication Sig Dispense Refill   albuterol (VENTOLIN HFA) 108 (90 Base) MCG/ACT inhaler Inhale 2 puffs into the lungs every 6 (six) hours as needed for wheezing or shortness of breath. 18 g 5   atorvastatin (LIPITOR) 20 MG tablet Take 1 tablet (20 mg total) by mouth daily. 90 tablet 1   celecoxib (CELEBREX) 200 MG capsule Take 1 capsule (200 mg total) by mouth 2 (two) times daily as needed for moderate pain. 180 capsule 3   DULoxetine (CYMBALTA) 60 MG capsule Take 2 capsules (120 mg total) by mouth daily. 180 capsule 1   Fluticasone-Umeclidin-Vilant (TRELEGY ELLIPTA) 200-62.5-25 MCG/ACT AEPB Inhale 1 puff into the lungs daily. 14 each 0   HYDROcodone-acetaminophen (NORCO) 10-325 MG tablet Take 1 tablet by mouth every 8 (eight) hours as needed. 30 tablet 0   meclizine (ANTIVERT) 50 MG tablet Take 1 tablet (50 mg total) by mouth 3 (three) times daily as needed. 30 tablet 0   meloxicam (MOBIC) 15 MG tablet Take 1 tablet (15 mg total) by mouth daily. 30 tablet 0   metoprolol tartrate (LOPRESSOR) 25 MG tablet Take 1 tablet (25 mg total) by mouth 2 (two) times daily. 180 tablet 3   pregabalin (LYRICA) 75 MG capsule 1 po bid 180 capsule 2   traMADol (ULTRAM) 50 MG tablet TAKE 1 TABLET(50 MG) BY MOUTH EVERY 6 HOURS AS NEEDED 90 tablet 0   Vitamin D, Ergocalciferol, (DRISDOL) 1.25 MG (50000 UNIT) CAPS capsule Take 1 capsule (50,000 Units total) by mouth every 7 (seven) days. 12 capsule 1   No facility-administered medications prior to visit.    Allergies  Allergen Reactions   Paregoric Hives    REACTION: HIVES   Penicillins Rash    REACTION: RASH   Procaine Hcl  Other (See Comments)    Review of Systems  Constitutional:  Positive for malaise/fatigue. Negative for fever.  HENT:  Negative for congestion.   Eyes:  Negative for blurred vision.  Respiratory:  Negative for shortness of breath.   Cardiovascular:  Negative for chest pain, palpitations and leg swelling.  Gastrointestinal:  Negative for abdominal pain, blood in stool and  nausea.  Genitourinary:  Negative for dysuria and frequency.  Musculoskeletal:  Negative for falls.  Skin:  Positive for rash.  Neurological:  Positive for headaches (constant - 2 weeks, no improvement). Negative for dizziness and loss of consciousness.  Endo/Heme/Allergies:  Negative for environmental allergies.  Psychiatric/Behavioral:  Negative for depression. The patient is not nervous/anxious.        Objective:    Physical Exam Vitals and nursing note reviewed.  Constitutional:      Appearance: She is well-developed.  HENT:     Head: Normocephalic and atraumatic.  Eyes:     Conjunctiva/sclera: Conjunctivae normal.  Neck:     Thyroid: No thyromegaly.     Vascular: No carotid bruit or JVD.  Cardiovascular:     Rate and Rhythm: Normal rate and regular rhythm.     Heart sounds: Normal heart sounds. No murmur heard. Pulmonary:     Effort: Pulmonary effort is normal. No respiratory distress.     Breath sounds: Normal breath sounds. No wheezing or rales.  Chest:     Chest wall: No tenderness.  Musculoskeletal:        General: Normal range of motion.     Cervical back: Normal range of motion and neck supple.  Neurological:     General: No focal deficit present.     Mental Status: She is alert and oriented to person, place, and time.  Psychiatric:        Mood and Affect: Mood normal.        Behavior: Behavior normal.        Thought Content: Thought content normal.        Judgment: Judgment normal.     BP 134/80 (BP Location: Left Arm, Patient Position: Sitting, Cuff Size: Large)   Pulse 60   Temp  97.8 F (36.6 C) (Oral)   Resp 18   Ht '5\' 8"'$  (1.727 m)   Wt 252 lb (114.3 kg)   SpO2 95%   BMI 38.32 kg/m  Wt Readings from Last 3 Encounters:  08/13/22 252 lb (114.3 kg)  07/15/22 250 lb (113.4 kg)  06/21/22 250 lb (113.4 kg)       Assessment & Plan:  Hyperlipidemia, unspecified hyperlipidemia type -     Comprehensive metabolic panel -     CBC with Differential/Platelet -     Lipid panel -     TSH  Vitamin D deficiency -     VITAMIN D 25 Hydroxy (Vit-D Deficiency, Fractures)  Primary hypertension -     Comprehensive metabolic panel -     CBC with Differential/Platelet -     Lipid panel -     TSH  B12 deficiency -     Vitamin B12  HOCM (hypertrophic obstructive cardiomyopathy) (HCC)  Moderate major depression (HCC)  Chronic respiratory failure with hypoxia (HCC)  Current mild episode of major depressive disorder, unspecified whether recurrent ([place])  Morbid obesity (HCC) -     Amb Ref to Medical Weight Management  Other migraine without status migrainosus, not intractable -     Sedimentation rate -     Ketorolac Tromethamine     I,Rachel Rivera,acting as a scribe for Veronica Held, DO.,have documented all relevant documentation on the behalf of Veronica Held, DO,as directed by  Veronica Held, DO while in the presence of Veronica Held, DO.   I, Veronica Held, DO, personally preformed the services described in this documentation.  All  medical record entries made by the scribe were at my direction and in my presence.  I have reviewed the chart and discharge instructions (if applicable) and agree that the record reflects my personal performance and is accurate and complete. 08/13/22   Veronica Held, DO

## 2022-08-14 LAB — LIPID PANEL
Cholesterol: 185 mg/dL (ref <200)
HDL: 66 mg/dL (ref >=50)
LDL Cholesterol (Calc): 95 mg/dL (calc)
Non-HDL Cholesterol (Calc): 119 mg/dL (calc) (ref <130)
Total CHOL/HDL Ratio: 2.8 (calc) (ref <5.0)
Triglycerides: 138 mg/dL (ref <150)

## 2022-08-14 LAB — CBC WITH DIFFERENTIAL/PLATELET
Absolute Monocytes: 635 cells/uL (ref 200–950)
Basophils Absolute: 37 cells/uL (ref 0–200)
Basophils Relative: 0.4 %
Eosinophils Absolute: 156 cells/uL (ref 15–500)
Eosinophils Relative: 1.7 %
HCT: 41.2 % (ref 35.0–45.0)
Hemoglobin: 13.5 g/dL (ref 11.7–15.5)
Lymphs Abs: 2438 cells/uL (ref 850–3900)
MCH: 28.2 pg (ref 27.0–33.0)
MCHC: 32.8 g/dL (ref 32.0–36.0)
MCV: 86 fL (ref 80.0–100.0)
MPV: 10.9 fL (ref 7.5–12.5)
Monocytes Relative: 6.9 %
Neutro Abs: 5934 cells/uL (ref 1500–7800)
Neutrophils Relative %: 64.5 %
Platelets: 271 10*3/uL (ref 140–400)
RBC: 4.79 10*6/uL (ref 3.80–5.10)
RDW: 13.1 % (ref 11.0–15.0)
Total Lymphocyte: 26.5 %
WBC: 9.2 10*3/uL (ref 3.8–10.8)

## 2022-08-14 LAB — COMPREHENSIVE METABOLIC PANEL
AG Ratio: 1.8 (calc) (ref 1.0–2.5)
ALT: 18 U/L (ref 6–29)
AST: 20 U/L (ref 10–35)
Albumin: 4.6 g/dL (ref 3.6–5.1)
Alkaline phosphatase (APISO): 93 U/L (ref 37–153)
BUN: 20 mg/dL (ref 7–25)
CO2: 28 mmol/L (ref 20–32)
Calcium: 10 mg/dL (ref 8.6–10.4)
Chloride: 103 mmol/L (ref 98–110)
Creat: 0.8 mg/dL (ref 0.50–1.05)
Globulin: 2.5 g/dL (calc) (ref 1.9–3.7)
Glucose, Bld: 82 mg/dL (ref 65–99)
Potassium: 4.6 mmol/L (ref 3.5–5.3)
Sodium: 140 mmol/L (ref 135–146)
Total Bilirubin: 0.7 mg/dL (ref 0.2–1.2)
Total Protein: 7.1 g/dL (ref 6.1–8.1)

## 2022-08-14 LAB — TSH: TSH: 1.13 mIU/L (ref 0.40–4.50)

## 2022-08-14 LAB — VITAMIN B12: Vitamin B-12: 408 pg/mL (ref 200–1100)

## 2022-08-14 LAB — SEDIMENTATION RATE: Sed Rate: 14 mm/h (ref 0–30)

## 2022-08-14 LAB — VITAMIN D 25 HYDROXY (VIT D DEFICIENCY, FRACTURES): Vit D, 25-Hydroxy: 71 ng/mL (ref 30–100)

## 2022-08-18 DIAGNOSIS — M5431 Sciatica, right side: Secondary | ICD-10-CM | POA: Diagnosis not present

## 2022-08-18 DIAGNOSIS — M9905 Segmental and somatic dysfunction of pelvic region: Secondary | ICD-10-CM | POA: Diagnosis not present

## 2022-08-18 DIAGNOSIS — M5134 Other intervertebral disc degeneration, thoracic region: Secondary | ICD-10-CM | POA: Diagnosis not present

## 2022-08-18 DIAGNOSIS — M5136 Other intervertebral disc degeneration, lumbar region: Secondary | ICD-10-CM | POA: Diagnosis not present

## 2022-08-18 DIAGNOSIS — M9903 Segmental and somatic dysfunction of lumbar region: Secondary | ICD-10-CM | POA: Diagnosis not present

## 2022-08-18 DIAGNOSIS — M9902 Segmental and somatic dysfunction of thoracic region: Secondary | ICD-10-CM | POA: Diagnosis not present

## 2022-08-19 ENCOUNTER — Other Ambulatory Visit: Payer: Self-pay | Admitting: Family Medicine

## 2022-08-23 ENCOUNTER — Telehealth: Payer: Self-pay | Admitting: Family Medicine

## 2022-08-23 ENCOUNTER — Other Ambulatory Visit: Payer: Self-pay | Admitting: Family Medicine

## 2022-08-23 DIAGNOSIS — I10 Essential (primary) hypertension: Secondary | ICD-10-CM

## 2022-08-23 DIAGNOSIS — E559 Vitamin D deficiency, unspecified: Secondary | ICD-10-CM

## 2022-08-23 NOTE — Telephone Encounter (Signed)
[place] [facility] to schedule their annual wellness visit. Appointment made for 08/13/2022.  Sherol Dade; Care Guide Ambulatory Clinical [facility] Direct Dial: [phone]

## 2022-08-26 ENCOUNTER — Ambulatory Visit (INDEPENDENT_AMBULATORY_CARE_PROVIDER_SITE_OTHER): Payer: Medicare PPO | Admitting: *Deleted

## 2022-08-26 DIAGNOSIS — Z1231 Encounter for screening mammogram for malignant neoplasm of breast: Secondary | ICD-10-CM | POA: Diagnosis not present

## 2022-08-26 DIAGNOSIS — Z0000 Encounter for general adult medical examination without abnormal findings: Secondary | ICD-10-CM

## 2022-08-26 NOTE — Progress Notes (Signed)
Subjective:  Pt completed ADLs, Fall risk, and SDOH during e-check in on 08/23/22.  Answers verified with pt.    [facility] Veronica Solomon is a 68 y.o. female who presents for Medicare Annual (Subsequent) preventive examination.  I connected with  [facility] on 08/26/22 by a audio enabled telemedicine application and verified that I am speaking with the correct person using two identifiers.  Patient Location: Home  Provider Location: Office/Clinic  I discussed the limitations of evaluation and management by telemedicine. The patient expressed understanding and agreed to proceed.   Review of Systems     Cardiac Risk Factors include: advanced age (>101mn, >>28women);dyslipidemia;hypertension     Objective:    Today's Vitals   08/23/22 1517  PainSc: 4    There is no height or weight on file to calculate BMI.     08/26/2022    1:41 PM 02/11/2021    3:44 PM 03/08/2020   10:46 PM  Advanced Directives  Does Patient Have a Medical Advance Directive? No No No  Would patient like information on creating a medical advance directive? No - Patient declined Yes (MAU/Ambulatory/Procedural Areas - Information given)     Current Medications (verified) Outpatient Encounter Medications as of 08/26/2022  Medication Sig   albuterol (VENTOLIN HFA) 108 (90 Base) MCG/ACT inhaler Inhale 2 puffs into the lungs every 6 (six) hours as needed for wheezing or shortness of breath.   atorvastatin (LIPITOR) 20 MG tablet TAKE 1 TABLET(20 MG) BY MOUTH DAILY   celecoxib (CELEBREX) 200 MG capsule Take 1 capsule (200 mg total) by mouth 2 (two) times daily as needed for moderate pain.   DULoxetine (CYMBALTA) 60 MG capsule TAKE 2 CAPSULES BY MOUTH EVERY DAY   Fluticasone-Umeclidin-Vilant (TRELEGY ELLIPTA) 200-62.5-25 MCG/ACT AEPB Inhale 1 puff into the lungs daily.   HYDROcodone-acetaminophen (NORCO) 10-325 MG tablet Take 1 tablet by mouth every 8 (eight) hours as needed.   meclizine (ANTIVERT) 50 MG  tablet Take 1 tablet (50 mg total) by mouth 3 (three) times daily as needed.   meloxicam (MOBIC) 15 MG tablet Take 1 tablet (15 mg total) by mouth daily.   metoprolol tartrate (LOPRESSOR) 25 MG tablet Take 1 tablet (25 mg total) by mouth 2 (two) times daily.   pregabalin (LYRICA) 75 MG capsule 1 po bid   traMADol (ULTRAM) 50 MG tablet TAKE 1 TABLET(50 MG) BY MOUTH EVERY 6 HOURS AS NEEDED   Vitamin D, Ergocalciferol, (DRISDOL) 1.25 MG (50000 UNIT) CAPS capsule TAKE 1 CAPSULE BY MOUTH EVERY 7 DAYS   No facility-administered encounter medications on file as of 08/26/2022.    Allergies (verified) Paregoric, Penicillins, and Procaine hcl   History: Past Medical History:  Diagnosis Date   Adenomatous colon polyp    Allergy    C. difficile colitis    Cataract 2020   COPD (chronic obstructive pulmonary disease) ([place] 2020   Depression 2010   Diverticulosis    Emphysema of lung ([facility] [place] 2022   GERD (gastroesophageal reflux disease) 2020   Heart murmur 2022   Hyperlipidemia    Hypertension    IBS (irritable bowel syndrome)    Nasal congestion 06/14/1998   Neuromuscular disorder ([facility]    Oxygen deficiency 2020   Sleep apnea 2020   Substance abuse ([place]  Past Surgical History:  Procedure Laterality Date   ABDOMINAL HYSTERECTOMY  06/14/2000   fibroids   EYE SURGERY  2020   PLANTAR FASCIECTOMY     TUBAL  LIGATION  1990   Family History  Problem Relation Age of Onset   Hypertension Mother    Depression Mother    Heart attack Mother    Uterine cancer Mother    Diabetes Maternal Grandmother    Hypertension Paternal Grandmother    Social History   Socioeconomic History   Marital status: Single    Spouse name: Not on file   Number of children: Not on file   Years of education: Not on file   Highest education level: Not on file  Occupational History   Occupation: self employed -- Herbalist  Tobacco Use   Smoking status: Former    Packs/day: 1.00     Years: 42.00    Additional pack years: 0.00    Total pack years: 42.00    Types: Cigarettes    Quit date: 12/29/2011    Years since quitting: 10.6   Smokeless tobacco: Former   Tobacco comments:    Quit 12/2011  Vaping Use   Vaping Use: Never used  Substance and Sexual Activity   Alcohol use: Yes    Comment: rarely   Drug use: No   Sexual activity: Not Currently  Other Topics Concern   Not on file  Social History Narrative   Exercise -- training dogs    Social Determinants of Health   Financial Resource Strain: Low Risk  (08/23/2022)   Overall Financial Resource Strain (CARDIA)    Difficulty of Paying Living Expenses: Not very hard  Food Insecurity: No Food Insecurity (08/23/2022)   Hunger Vital Sign    Worried About Running Out of Food in the Last Year: Never true    [place] in the Last Year: Never true  Transportation Needs: No Transportation Needs (08/23/2022)   PRAPARE - Hydrologist (Medical): No    Lack of Transportation (Non-Medical): No  Physical Activity: Insufficiently Active (08/23/2022)   Exercise Vital Sign    Days of Exercise per Week: 4 days    Minutes of Exercise per Session: 20 min  Stress: Stress Concern Present (08/23/2022)   [place]    Feeling of Stress : To some extent  Social Connections: Socially Isolated (08/23/2022)   Social Connection and Isolation Panel [NHANES]    Frequency of Communication with Friends and Family: Once a week    Frequency of Social Gatherings with Friends and Family: Once a week    Attends Religious Services: Never    Marine scientist or Organizations: Yes    Attends Music therapist: More than 4 times per year    Marital Status: Widowed    Tobacco Counseling Counseling given: Not Answered Tobacco comments: Quit 12/2011   Clinical Intake:  Pre-visit preparation completed: Yes  Pain : 0-10 Pain  Score: 4  Pain Type: Chronic pain Pain Location: Hand Pain Descriptors / Indicators: Aching Pain Onset: More than a month ago Pain Frequency: Constant  Diabetes: No  How often do you need to have someone help you when you read instructions, pamphlets, or other written materials from your doctor or pharmacy?: 1 - Never  Activities of Daily Living    08/23/2022    3:17 PM 11/12/2021    1:41 PM  In your present state of health, do you have any difficulty performing the following activities:  Hearing? 0 0  Vision? 0 0  Difficulty concentrating or making decisions? 1 0  Walking or climbing  stairs? 1 0  Dressing or bathing? 0 0  Doing errands, shopping? 0 0  Preparing Food and eating ? N   Using the Toilet? N   In the past six months, have you accidently leaked urine? Y   Do you have problems with loss of bowel control? N   Managing your Medications? N   Managing your Finances? N   Housekeeping or managing your Housekeeping? Y     Patient Care Team: Carollee Herter, Alferd Apa, DO as PCP - General  Indicate any recent Medical Services you may have received from other than [facility] providers in the past year (date may be approximate).     Assessment:   This is a routine wellness examination for [facility].  Hearing/Vision screen No results found.  Dietary issues and exercise activities discussed: Current Exercise Habits: Home exercise routine, Type of exercise: walking, Time (Minutes): 25, Frequency (Times/Week): 3, Weekly Exercise (Minutes/Week): 75, Intensity: Mild, Exercise limited by: None identified   Goals Addressed   None    Depression Screen    08/26/2022    1:45 PM 05/14/2022    1:55 PM 11/12/2021    1:41 PM 08/03/2021    1:45 PM 02/11/2021    3:48 PM 08/01/2020    1:30 PM 04/26/2019    2:01 PM  PHQ 2/9 Scores  PHQ - 2 Score '2 1 1 '$ 0 1 4 0  PHQ- 9 Score  3    21     Fall Risk    08/23/2022    3:17 PM 08/13/2022    1:25 PM 05/14/2022    1:40 PM 11/12/2021    1:41 PM  02/11/2021    3:46 PM  [place] in the past year? 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 0 0 0 0 0  Risk for fall due to : History of fall(s)    History of fall(s)  Follow up Falls evaluation completed Falls evaluation completed Falls evaluation completed Falls evaluation completed Falls prevention discussed    FALL RISK PREVENTION PERTAINING TO THE HOME:  Any stairs in or around the home? No  Home free of loose throw rugs in walkways, pet beds, electrical cords, etc? Yes  Adequate lighting in your home to reduce risk of falls? Yes   ASSISTIVE DEVICES UTILIZED TO PREVENT FALLS:  Life alert? No  Use of a cane, walker or w/c? No  Grab bars in the bathroom? No  Shower chair or bench in shower? No  Elevated toilet seat or a handicapped toilet? No   TIMED UP AND GO:  Was the test performed?  No, audio visit .   Cognitive Function:    09/05/2017    3:00 PM  MMSE - Mini Mental State Exam  Orientation to time 5  Orientation to Place 5  Registration 3  Attention/ Calculation 5  Recall 3  Language- name 2 objects 2  Language- repeat 1  Language- follow 3 step command 3  Language- read & follow direction 1  Write a sentence 1  Copy design 1  Total score 30      09/01/2016   11:00 AM  Montreal Cognitive Assessment   Visuospatial/ Executive (0/5) 5  Naming (0/3) 3  Attention: Read list of digits (0/2) 2  Attention: Read list of letters (0/1) 1  Attention: Serial 7 subtraction starting  at 100 (0/3) 3  Language: Repeat phrase (0/2) 2  Language : Fluency (0/1) 1  Abstraction (0/2) 2  Delayed Recall (0/5) 4  Orientation (0/6) 6  Total 29      08/26/2022    1:52 PM  6CIT Screen  What Year? 0 points  What month? 0 points  What time? 0 points  Count back from 20 0 points  Months in reverse 0 points  Repeat phrase 2 points  Total Score 2 points    Immunizations Immunization History   Administered Date(s) Administered   COVID-19, mRNA, vaccine(Comirnaty)12 years and older 05/14/2022   PFIZER(Purple Top)SARS-COV-2 Vaccination 09/04/2019, 09/27/2019   PNEUMOCOCCAL CONJUGATE-20 07/29/2021   Pfizer Covid-19 Vaccine Bivalent Booster 77yr & up 08/03/2021   Respiratory Syncytial Virus Vaccine,Recomb Aduvanted(Arexvy) 05/14/2022   Td 01/10/2007   Tdap 02/03/2018, 06/30/2022   Zoster Recombinat (Shingrix) 04/26/2019, 07/05/2019    TDAP status: Up to date  Flu Vaccine status: Declined, Education has been provided regarding the importance of this vaccine but patient still declined. Advised may receive this vaccine at local pharmacy or Health Dept. Aware to provide a copy of the vaccination record if obtained from local pharmacy or Health Dept. Verbalized acceptance and understanding.  Pneumococcal vaccine status: Up to date  Covid-19 vaccine status: Information provided on how to obtain vaccines.   Qualifies for Shingles Vaccine? Yes   Zostavax completed No   Shingrix Completed?: Yes  Screening Tests Health Maintenance  Topic Date Due   COLONOSCOPY (Pts 45-41yrInsurance coverage will need to be confirmed)  01/31/2019   Medicare Annual Wellness (AWV)  02/11/2022   MAMMOGRAM  03/11/2022   COVID-19 Vaccine (5 - 2023-24 season) 07/09/2022   HIV Screening  11/13/2022 (Originally 09/08/1970)   INFLUENZA VACCINE  09/12/2027 (Originally 01/12/2022)   Lung Cancer Screening  07/31/2023   DTaP/Tdap/Td (4 - Td or Tdap) 06/30/2032   Pneumonia Vaccine 6554Years old  Completed   DEXA SCAN  Completed   Hepatitis C Screening  Completed   Zoster Vaccines- Shingrix  Completed   HPV VACCINES  Aged Out    Health Maintenance  Health Maintenance Due  Topic Date Due   COLONOSCOPY (Pts 45-4921yrnsurance coverage will need to be confirmed)  01/31/2019   Medicare Annual Wellness (AWV)  02/11/2022   MAMMOGRAM  03/11/2022   COVID-19 Vaccine (5 - 2023-24 season) 07/09/2022     Colorectal Cancer screen: Pt prefers to postpone for now.  Mammogram status: Ordered 08/26/22. Pt provided with contact info and advised to call to schedule appt.   Bone Density status: Completed 01/14/22. Results reflect: Bone density results: OSTEOPENIA. Repeat every 2 years.  Lung Cancer Screening: (Low Dose CT Chest recommended if Age 16-67-80ars, 30 pack-year currently smoking OR have quit w/in 15years.) does qualify.   Lung Cancer Screening Referral: in system.  Last scan was completed on 07/30/22  Additional Screening:  Hepatitis C Screening: does qualify; Completed 03/18/09  Vision Screening: Recommended annual ophthalmology exams for early detection of glaucoma and other disorders of the eye. Is the patient up to date with their annual eye exam?  Yes  Who is the provider or what is the name of the office in which the patient attends annual eye exams? Dr. SusBella Kennedy-Allen If pt is not established with a provider, would they like to be referred to a provider to establish care? No .   Dental Screening: Recommended annual dental exams for proper oral hygiene  Community Resource Referral / Chronic Care  Management: CRR required this visit?  No   CCM required this visit?  No      Plan:     I have personally reviewed and noted the following in the patient's chart:   Medical and social history Use of alcohol, tobacco or illicit drugs  Current medications and supplements including opioid prescriptions. Patient is currently taking opioid prescriptions. Information provided to patient regarding non-opioid alternatives. Patient advised to discuss non-opioid treatment plan with their provider. Functional ability and status Nutritional status Physical activity Advanced directives List of other physicians Hospitalizations, surgeries, and ER visits in previous 12 months Vitals Screenings to include cognitive, depression, and falls Referrals and appointments  In addition, I  have reviewed and discussed with patient certain preventive protocols, quality metrics, and best practice recommendations. A written personalized care plan for preventive services as well as general preventive health recommendations were provided to patient.   Due to this being a telephonic visit, the after visit summary with patients personalized plan was offered to patient via mail or my-chart. Patient would like to access on my-chart.  Beatris Ship, [place]   08/26/2022   Nurse Notes: None

## 2022-08-26 NOTE — Patient Instructions (Signed)
Veronica Solomon , Thank you for taking time to come for your Medicare Wellness Visit. I appreciate your ongoing commitment to your health goals. Please review the following plan we discussed and let me know if I can assist you in the future.     This is a list of the screening recommended for you and due dates:  Health Maintenance  Topic Date Due   Colon Cancer Screening  01/31/2019   Mammogram  03/11/2022   COVID-19 Vaccine (5 - 2023-24 season) 07/09/2022   HIV Screening  11/13/2022*   Flu Shot  09/12/2027*   Screening for Lung Cancer  07/31/2023   Medicare Annual Wellness Visit  08/26/2023   DTaP/Tdap/Td vaccine (4 - Td or Tdap) 06/30/2032   Pneumonia Vaccine  Completed   DEXA scan (bone density measurement)  Completed   Hepatitis C Screening: USPSTF Recommendation to screen - Ages 49-79 yo.  Completed   Zoster (Shingles) Vaccine  Completed   HPV Vaccine  Aged Out  *Topic was postponed. The date shown is not the original due date.    Next appointment: Follow up in one year for your annual wellness visit.   Preventive Care 67 Years and Older, Female Preventive care refers to lifestyle choices and visits with your health care provider that can promote health and wellness. What does preventive care include? A yearly physical exam. This is also called an annual well check. Dental exams once or twice a year. Routine eye exams. Ask your health care provider how often you should have your eyes checked. Personal lifestyle choices, including: Daily care of your teeth and gums. Regular physical activity. Eating a healthy diet. Avoiding tobacco and drug use. Limiting alcohol use. Practicing safe sex. Taking low-dose aspirin every day. Taking vitamin and mineral supplements as recommended by your health care provider. What happens during an annual well check? The services and screenings done by your health care provider during your annual well check will depend on your age, overall  health, lifestyle risk factors, and family history of disease. Counseling  Your health care provider may ask you questions about your: Alcohol use. Tobacco use. Drug use. Emotional well-being. Home and relationship well-being. Sexual activity. Eating habits. History of falls. Memory and ability to understand (cognition). Work and work Statistician. Reproductive health. Screening  You may have the following tests or measurements: Height, weight, and BMI. Blood pressure. Lipid and cholesterol levels. These may be checked every 5 years, or more frequently if you are over 55 years old. Skin check. Lung cancer screening. You may have this screening every year starting at age 34 if you have a 30-pack-year history of smoking and currently smoke or have quit within the past 15 years. Fecal occult blood test (FOBT) of the stool. You may have this test every year starting at age 45. Flexible sigmoidoscopy or colonoscopy. You may have a sigmoidoscopy every 5 years or a colonoscopy every 10 years starting at age 28. Hepatitis C blood test. Hepatitis B blood test. Sexually transmitted disease (STD) testing. Diabetes screening. This is done by checking your blood sugar (glucose) after you have not eaten for a while (fasting). You may have this done every 1-3 years. Bone density scan. This is done to screen for osteoporosis. You may have this done starting at age 74. Mammogram. This may be done every 1-2 years. Talk to your health care provider about how often you should have regular mammograms. Talk with your health care provider about your test results, treatment options,  and if necessary, the need for more tests. Vaccines  Your health care provider may recommend certain vaccines, such as: Influenza vaccine. This is recommended every year. Tetanus, diphtheria, and acellular pertussis (Tdap, Td) vaccine. You may need a Td booster every 10 years. Zoster vaccine. You may need this after age  2. Pneumococcal 13-valent conjugate (PCV13) vaccine. One dose is recommended after age 23. Pneumococcal polysaccharide (PPSV23) vaccine. One dose is recommended after age 69. Talk to your health care provider about which screenings and vaccines you need and how often you need them. This information is not intended to replace advice given to you by your health care provider. Make sure you discuss any questions you have with your health care provider. Document Released: 06/27/2015 Document Revised: 02/18/2016 Document Reviewed: 04/01/2015 Elsevier Interactive Patient Education  2017 [place] Prevention in the Home Falls can cause injuries. They can happen to people of all ages. There are many things you can do to make your home safe and to help prevent falls. What can I do on the outside of my home? Regularly fix the edges of walkways and driveways and fix any cracks. Remove anything that might make you trip as you walk through a door, such as a raised step or threshold. Trim any bushes or trees on the path to your home. Use bright outdoor lighting. Clear any walking paths of anything that might make someone trip, such as rocks or tools. Regularly check to see if handrails are loose or broken. Make sure that both sides of any steps have handrails. Any raised decks and porches should have guardrails on the edges. Have any leaves, snow, or ice cleared regularly. Use sand or salt on walking paths during winter. Clean up any spills in your garage right away. This includes oil or grease spills. What can I do in the bathroom? Use night lights. Install grab bars by the toilet and in the tub and shower. Do not use towel bars as grab bars. Use non-skid mats or decals in the tub or shower. If you need to sit down in the shower, use a plastic, non-slip stool. Keep the floor dry. Clean up any water that spills on the floor as soon as it happens. Remove soap buildup in the tub or shower  regularly. Attach bath mats securely with double-sided non-slip rug tape. Do not have throw rugs and other things on the floor that can make you trip. What can I do in the bedroom? Use night lights. Make sure that you have a light by your bed that is easy to reach. Do not use any sheets or blankets that are too big for your bed. They should not hang down onto the floor. Have a firm chair that has side arms. You can use this for support while you get dressed. Do not have throw rugs and other things on the floor that can make you trip. What can I do in the kitchen? Clean up any spills right away. Avoid walking on wet floors. Keep items that you use a lot in easy-to-reach places. If you need to reach something above you, use a strong step stool that has a grab bar. Keep electrical cords out of the way. Do not use floor polish or wax that makes floors slippery. If you must use wax, use non-skid floor wax. Do not have throw rugs and other things on the floor that can make you trip. What can I do with my stairs? Do not leave  any items on the stairs. Make sure that there are handrails on both sides of the stairs and use them. Fix handrails that are broken or loose. Make sure that handrails are as long as the stairways. Check any carpeting to make sure that it is firmly attached to the stairs. Fix any carpet that is loose or worn. Avoid having throw rugs at the top or bottom of the stairs. If you do have throw rugs, attach them to the floor with carpet tape. Make sure that you have a light switch at the top of the stairs and the bottom of the stairs. If you do not have them, ask someone to add them for you. What else can I do to help prevent falls? Wear shoes that: Do not have high heels. Have rubber bottoms. Are comfortable and fit you well. Are closed at the toe. Do not wear sandals. If you use a stepladder: Make sure that it is fully opened. Do not climb a closed stepladder. Make sure that  both sides of the stepladder are locked into place. Ask someone to hold it for you, if possible. Clearly mark and make sure that you can see: Any grab bars or handrails. First and last steps. Where the edge of each step is. Use tools that help you move around (mobility aids) if they are needed. These include: Canes. Walkers. Scooters. Crutches. Turn on the lights when you go into a dark area. Replace any light bulbs as soon as they burn out. Set up your furniture so you have a clear path. Avoid moving your furniture around. If any of your floors are uneven, fix them. If there are any pets around you, be aware of where they are. Review your medicines with your doctor. Some medicines can make you feel dizzy. This can increase your chance of falling. Ask your doctor what other things that you can do to help prevent falls. This information is not intended to replace advice given to you by your health care provider. Make sure you discuss any questions you have with your health care provider. Document Released: 03/27/2009 Document Revised: 11/06/2015 Document Reviewed: 07/05/2014 Elsevier Interactive Patient Education  2017 [employer].

## 2022-09-09 DIAGNOSIS — J9621 Acute and chronic respiratory failure with hypoxia: Secondary | ICD-10-CM | POA: Diagnosis not present

## 2022-09-09 DIAGNOSIS — G4733 Obstructive sleep apnea (adult) (pediatric): Secondary | ICD-10-CM | POA: Diagnosis not present

## 2022-09-20 DIAGNOSIS — M5136 Other intervertebral disc degeneration, lumbar region: Secondary | ICD-10-CM | POA: Diagnosis not present

## 2022-09-20 DIAGNOSIS — M9903 Segmental and somatic dysfunction of lumbar region: Secondary | ICD-10-CM | POA: Diagnosis not present

## 2022-09-20 DIAGNOSIS — M5134 Other intervertebral disc degeneration, thoracic region: Secondary | ICD-10-CM | POA: Diagnosis not present

## 2022-09-20 DIAGNOSIS — M5431 Sciatica, right side: Secondary | ICD-10-CM | POA: Diagnosis not present

## 2022-09-20 DIAGNOSIS — M9905 Segmental and somatic dysfunction of pelvic region: Secondary | ICD-10-CM | POA: Diagnosis not present

## 2022-09-20 DIAGNOSIS — M9902 Segmental and somatic dysfunction of thoracic region: Secondary | ICD-10-CM | POA: Diagnosis not present

## 2022-09-29 ENCOUNTER — Telehealth (HOSPITAL_BASED_OUTPATIENT_CLINIC_OR_DEPARTMENT_OTHER): Payer: Self-pay

## 2022-10-10 DIAGNOSIS — G4733 Obstructive sleep apnea (adult) (pediatric): Secondary | ICD-10-CM | POA: Diagnosis not present

## 2022-10-10 DIAGNOSIS — J9621 Acute and chronic respiratory failure with hypoxia: Secondary | ICD-10-CM | POA: Diagnosis not present

## 2022-10-13 DIAGNOSIS — M9903 Segmental and somatic dysfunction of lumbar region: Secondary | ICD-10-CM | POA: Diagnosis not present

## 2022-10-13 DIAGNOSIS — M9905 Segmental and somatic dysfunction of pelvic region: Secondary | ICD-10-CM | POA: Diagnosis not present

## 2022-10-13 DIAGNOSIS — M5136 Other intervertebral disc degeneration, lumbar region: Secondary | ICD-10-CM | POA: Diagnosis not present

## 2022-10-13 DIAGNOSIS — M9902 Segmental and somatic dysfunction of thoracic region: Secondary | ICD-10-CM | POA: Diagnosis not present

## 2022-10-13 DIAGNOSIS — M5431 Sciatica, right side: Secondary | ICD-10-CM | POA: Diagnosis not present

## 2022-10-13 DIAGNOSIS — M5134 Other intervertebral disc degeneration, thoracic region: Secondary | ICD-10-CM | POA: Diagnosis not present

## 2022-11-01 DIAGNOSIS — M5134 Other intervertebral disc degeneration, thoracic region: Secondary | ICD-10-CM | POA: Diagnosis not present

## 2022-11-01 DIAGNOSIS — M9905 Segmental and somatic dysfunction of pelvic region: Secondary | ICD-10-CM | POA: Diagnosis not present

## 2022-11-01 DIAGNOSIS — M9903 Segmental and somatic dysfunction of lumbar region: Secondary | ICD-10-CM | POA: Diagnosis not present

## 2022-11-01 DIAGNOSIS — M5431 Sciatica, right side: Secondary | ICD-10-CM | POA: Diagnosis not present

## 2022-11-01 DIAGNOSIS — M9902 Segmental and somatic dysfunction of thoracic region: Secondary | ICD-10-CM | POA: Diagnosis not present

## 2022-11-01 DIAGNOSIS — M5136 Other intervertebral disc degeneration, lumbar region: Secondary | ICD-10-CM | POA: Diagnosis not present

## 2022-11-09 DIAGNOSIS — G4733 Obstructive sleep apnea (adult) (pediatric): Secondary | ICD-10-CM | POA: Diagnosis not present

## 2022-11-09 DIAGNOSIS — J9621 Acute and chronic respiratory failure with hypoxia: Secondary | ICD-10-CM | POA: Diagnosis not present

## 2022-11-18 ENCOUNTER — Encounter (INDEPENDENT_AMBULATORY_CARE_PROVIDER_SITE_OTHER): Payer: Self-pay | Admitting: Family Medicine

## 2022-11-18 ENCOUNTER — Telehealth (INDEPENDENT_AMBULATORY_CARE_PROVIDER_SITE_OTHER): Payer: Medicare PPO | Admitting: Family Medicine

## 2022-11-18 DIAGNOSIS — Z0289 Encounter for other administrative examinations: Secondary | ICD-10-CM

## 2022-11-18 DIAGNOSIS — Z6838 Body mass index (BMI) 38.0-38.9, adult: Secondary | ICD-10-CM

## 2022-11-18 DIAGNOSIS — I421 Obstructive hypertrophic cardiomyopathy: Secondary | ICD-10-CM | POA: Diagnosis not present

## 2022-11-18 DIAGNOSIS — R7303 Prediabetes: Secondary | ICD-10-CM | POA: Diagnosis not present

## 2022-11-18 DIAGNOSIS — E669 Obesity, unspecified: Secondary | ICD-10-CM | POA: Diagnosis not present

## 2022-11-18 NOTE — Progress Notes (Signed)
Office: [phone]  /  Fax: [phone]   TeleHealth Visit:  This visit was completed with telemedicine (audio/video) technology. Veronica Solomon has verbally consented to this TeleHealth visit. The patient is located at home, the provider is located at home. The participants in this visit include the listed provider and patient. The visit was conducted today via MyChart video.  Initial Visit  [facility] was seen via virtual visit today to evaluate for obesity. She is interested in losing weight to improve overall health and reduce the risk of weight related complications. She presents today to review program treatment options, initial physical assessment, and evaluation.     Height: 5'8" Weight: 252 lbs BMI: 38.3  She was referred by: PCP  When asked what else they would like to accomplish? She states: Improve energy levels and physical activity, Improve existing medical conditions, and Improve self-confidence  Weight history: Has gained weight in last 10 years since she was put on disability for fibromyalgia.  When asked how has your weight affected you? She states: Has affected self-esteem, Having fatigue, and Has affected mood   Some associated conditions: Hypertension, Prediabetes, and Other: And fibromyalgia  Contributing factors: Reduced physical activity and Other: chronic pain (fibromyalgia)  Weight promoting medications identified: Beta-blockers and Other: Lyrica  Current nutrition plan: None  Current level of physical activity: Walking daily  Current or previous pharmacotherapy: None  Response to medication: Never tried medications  Past medical history includes:   Past Medical History:  Diagnosis Date   Adenomatous colon polyp    Allergy    C. difficile colitis    Cataract 2020   COPD (chronic obstructive pulmonary disease) (HCC) 2020   Depression 2010   Diverticulosis    Emphysema of lung (HCC) 2022   GERD (gastroesophageal reflux disease) 2020    Heart murmur 2022   Hyperlipidemia    Hypertension    IBS (irritable bowel syndrome)    Nasal congestion 06/14/1998   Neuromuscular disorder (HCC)    Oxygen deficiency 2020   Sleep apnea 2020   Substance abuse (HCC) 1974   Ulcer 1964     Objective:     General:  Alert, oriented and cooperative. Patient is in no acute distress.  Respiratory: Normal respiratory effort, no problems with respiration noted   Gait: able to ambulate independently  Mental Status: Normal mood and affect. Normal behavior. Normal judgment and thought content.    Assessment and Plan:   1. Prediabetes Last A1c was 5.8 in July 20126.  Medication(s): None Lab Results  Component Value Date   HGBA1C 5.8 12/30/2014   No results found for: "INSULIN"  Plan: Check A1c firsty office visit. Begin working on [employer].   2.  Hypertrophic obstructive cardiomyopathy Follows with Dr. Mackey Birchwood. On metoprolol and Lipitor.  Plan: Begin working on healthier diet to improve impact of this condition on quality of life. Continue to follow-up with cardiology.  3. Obesity Treatment / Action Plan:  Will complete provided nutritional and psychosocial assessment questionnaire before the next appointment. Will be scheduled for indirect calorimetry to determine resting energy expenditure in a fasting state.  This will allow [place] to create a reduced calorie, high-protein meal plan to promote loss of fat mass while preserving muscle mass. Was counseled on pharmacotherapy and role as an adjunct in weight management.   Obesity Education Performed Today:  We discussed obesity as a disease and the importance of a more detailed evaluation of all the factors contributing to the disease.  We discussed the importance of long term lifestyle changes which include nutrition, exercise and behavioral modifications as well as the importance of customizing this to her specific health and social needs.  We discussed the  benefits of reaching a healthier weight to alleviate the symptoms of existing conditions and reduce the risks of the biomechanical, metabolic and psychological effects of obesity.  [facility] appears to be in the action stage of change and states they are ready to start intensive lifestyle modifications and behavioral modifications.  ______________________________________________________________________________  She will be contacted by [facility] to set up initial appointment and the first follow up appointment with a physician.   The following office policies were discussed.  She voiced understanding: - Patient will be considered late at 6 minutes past appointment time.   - For the first office visit, patient needs to arrive 1 hour early, fasting except for water. Patient should arrive 15 minutes early for all other visits.  30 minutes was spent today on this visit including the above counseling, pre-visit chart review, and post-visit documentation.  Reviewed by clinician on day of visit: allergies, medications, problem list, medical history, surgical history, family history, social history, and previous encounter notes pertinent to obesity diagnosis.    Jesse Sans, FNP

## 2022-12-01 ENCOUNTER — Ambulatory Visit (INDEPENDENT_AMBULATORY_CARE_PROVIDER_SITE_OTHER): Payer: Medicare PPO | Admitting: Family Medicine

## 2022-12-10 DIAGNOSIS — G4733 Obstructive sleep apnea (adult) (pediatric): Secondary | ICD-10-CM | POA: Diagnosis not present

## 2022-12-10 DIAGNOSIS — J9621 Acute and chronic respiratory failure with hypoxia: Secondary | ICD-10-CM | POA: Diagnosis not present

## 2022-12-14 ENCOUNTER — Other Ambulatory Visit: Payer: Self-pay | Admitting: Family Medicine

## 2022-12-14 DIAGNOSIS — G894 Chronic pain syndrome: Secondary | ICD-10-CM

## 2022-12-15 ENCOUNTER — Ambulatory Visit (INDEPENDENT_AMBULATORY_CARE_PROVIDER_SITE_OTHER): Payer: Medicare PPO | Admitting: Family Medicine

## 2022-12-15 MED ORDER — HYDROCODONE-ACETAMINOPHEN 10-325 MG PO TABS: 1.0000 | ORAL_TABLET | Freq: Three times a day (TID) | ORAL | 0 refills | Status: DC | PRN

## 2022-12-15 NOTE — Telephone Encounter (Signed)
Requesting: hydrocodone 10-325mg   Contract: 05/14/22 UDS: 05/14/22 Last Visit: 08/13/22 Next Visit: None Last Refill: 06/09/22 #30 and 0RF   Please Advise

## 2023-01-09 DIAGNOSIS — G4733 Obstructive sleep apnea (adult) (pediatric): Secondary | ICD-10-CM | POA: Diagnosis not present

## 2023-01-09 DIAGNOSIS — J9621 Acute and chronic respiratory failure with hypoxia: Secondary | ICD-10-CM | POA: Diagnosis not present

## 2023-01-18 ENCOUNTER — Other Ambulatory Visit: Payer: Self-pay | Admitting: Family Medicine

## 2023-01-28 ENCOUNTER — Ambulatory Visit: Payer: Medicare PPO | Admitting: Family Medicine

## 2023-01-28 ENCOUNTER — Other Ambulatory Visit (HOSPITAL_BASED_OUTPATIENT_CLINIC_OR_DEPARTMENT_OTHER): Payer: Self-pay

## 2023-01-28 ENCOUNTER — Encounter: Payer: Self-pay | Admitting: Family Medicine

## 2023-01-28 VITALS — BP 124/86 | HR 82 | Temp 98.0°F | Resp 18 | Ht 68.0 in | Wt 256.8 lb

## 2023-01-28 DIAGNOSIS — B370 Candidal stomatitis: Secondary | ICD-10-CM

## 2023-01-28 MED ORDER — NYSTATIN 100000 UNIT/ML MT SUSP
5.0000 mL | Freq: Four times a day (QID) | OROMUCOSAL | 3 refills | Status: DC
  Filled 2023-01-28: qty 60, 3d supply, fill #0

## 2023-01-28 NOTE — Progress Notes (Signed)
Established Patient Office Visit  Subjective   Patient ID: Veronica Solomon, female    DOB: 04/02/1956  Age: 67 y.o. MRN: [id number]  Chief Complaint  Patient presents with   Oral Pain    X1 week, pain in mouth, thrush on tongue, cheeks.     HPI Discussed the use of AI scribe software for clinical note transcription with the patient, who gave verbal consent to proceed.  History of Present Illness   The patient, with a history of using Trelegy, recently moved to [facility] and presents with vertigo and thrush. She experienced a few days of vertigo, during which she was bedridden and unable to maintain oral hygiene. Following the vertigo episode, she developed thrush. She has continued to use Trelegy and brush her teeth despite the discomfort.      Patient Active Problem List   Diagnosis Date Noted   HOCM (hypertrophic obstructive cardiomyopathy) (HCC) 08/13/2022   Moderate major depression (HCC) 08/13/2022   Chronic respiratory failure with hypoxia (HCC) 08/13/2022   Achilles tendinitis of right lower extremity 05/14/2022   Cardiac murmur 08/03/2021   Vitamin D deficiency 04/13/2021   B12 deficiency 04/13/2021   Insect bite of right lower leg 12/18/2020   Chronic bronchitis (HCC) 12/18/2020   COVID-19 11/24/2020   Acute bronchitis with COPD (HCC) 11/04/2020   Sinusitis, bacterial 11/04/2020   Candida vaginitis 11/04/2020   Bronchitis 11/04/2020   Pulmonary emphysema (HCC) 07/11/2020   Current mild episode of major depressive disorder (HCC) 04/26/2019   CAD (coronary artery disease) 03/07/2018   Sensorineural hearing loss (SNHL), 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   Tinnitus, 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   Chronic swimmer's ear of both sides 02/22/2018   Skin tag 12/27/2016   Chronic daily headache 09/01/2016   Memory loss due to medical condition 06/30/2016   Hyperlipidemia 12/30/2014   Obesity (BMI 30-39.9) 06/25/2013   Numerous moles 11/24/2012   Chronic pain syndrome 09/29/2010    COUGH 12/04/2009   CONSTIPATION 07/18/2009   Myalgia and myositis, unspecified 04/14/2009   DEPRESSION 03/19/2009   PARESTHESIA 03/19/2009   DIARRHEA, INFECTIOUS 03/18/2009   DIARRHEA 03/18/2009   PERSONAL HX COLONIC POLYPS 03/18/2009   Fatigue 03/10/2009   Primary hypertension 02/28/2009   IBS 02/14/2009   POSTMENOPAUSAL STATUS 12/30/2008   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 04/01/2008   HEADACHE 04/01/2008   ACUTE BRONCHITIS 03/07/2008   URI 07/10/2007   Past Medical History:  Diagnosis Date   Adenomatous colon polyp    Allergy    C. difficile colitis    Cataract 2020   COPD (chronic obstructive pulmonary disease) (HCC) 2020   Depression 2010   Diverticulosis    Emphysema of lung (HCC) 2022   GERD (gastroesophageal reflux disease) 2020   Heart murmur 2022   Hyperlipidemia    Hypertension    IBS (irritable bowel syndrome)    Nasal congestion 06/14/1998   Neuromuscular disorder (HCC)    Oxygen deficiency 2020   Sleep apnea 2020   Substance abuse (HCC) 1974   Ulcer 1964   Past Surgical History:  Procedure Laterality Date   ABDOMINAL HYSTERECTOMY  06/14/2000   fibroids   EYE SURGERY  2020   PLANTAR FASCIECTOMY     TUBAL LIGATION  1990   Social History   Tobacco Use   Smoking status: Former    Current packs/day: 0.00    Average packs/day: 1 pack/day for 42.0 years (42.0 ttl pk-yrs)    Types: Cigarettes    Start date:  12/28/1969    Quit date: 12/29/2011    Years since quitting: 11.0   Smokeless tobacco: Former   Tobacco comments:    Quit 12/2011  Vaping Use   Vaping status: Never Used  Substance Use Topics   Alcohol use: Yes    Comment: rarely   Drug use: No   Social History   Socioeconomic History   Marital status: Single    Spouse name: Not on file   Number of children: Not on file   Years of education: Not on file   Highest education level: Not on file  Occupational History   Occupation: self employed -- Archivist  Tobacco Use    Smoking status: Former    Current packs/day: 0.00    Average packs/day: 1 pack/day for 42.0 years (42.0 ttl pk-yrs)    Types: Cigarettes    Start date: 12/28/1969    Quit date: 12/29/2011    Years since quitting: 11.0   Smokeless tobacco: Former   Tobacco comments:    Quit 12/2011  Vaping Use   Vaping status: Never Used  Substance and Sexual Activity   Alcohol use: Yes    Comment: rarely   Drug use: No   Sexual activity: Not Currently  Other Topics Concern   Not on file  Social History Narrative   Exercise -- training dogs    Social Determinants of Health   Financial Resource Strain: Low Risk  (08/23/2022)   Overall Financial Resource Strain (CARDIA)    Difficulty of Paying Living Expenses: Not very hard  Food Insecurity: No Food Insecurity (08/23/2022)   Hunger Vital Sign    Worried About Running Out of Food in the Last Year: Never true    Ran Out of Food in the Last Year: Never true  Transportation Needs: No Transportation Needs (08/23/2022)   PRAPARE - Administrator, Civil Service (Medical): No    Lack of Transportation (Non-Medical): No  Physical Activity: Insufficiently Active (08/23/2022)   Exercise Vital Sign    Days of Exercise per Week: 4 days    Minutes of Exercise per Session: 20 min  Stress: Stress Concern Present (08/23/2022)   [employer] of Occupational Health - Occupational Stress Questionnaire    Feeling of Stress : To some extent  Social Connections: Socially Isolated (08/23/2022)   Social Connection and Isolation Panel [NHANES]    Frequency of Communication with Friends and Family: Once a week    Frequency of Social Gatherings with Friends and Family: Once a week    Attends Religious Services: Never    Database administrator or Organizations: Yes    Attends Engineer, structural: More than 4 times per year    Marital Status: Widowed  Intimate Partner Violence: Not At Risk (08/26/2022)   Humiliation, Afraid, Rape, and Kick  questionnaire    Fear of Current or Ex-Partner: No    Emotionally Abused: No    Physically Abused: No    Sexually Abused: No   Family Status  Relation Name Status   Mother  Deceased   Father  Deceased   Sister  Alive   Daughter  Alive   MGM  Deceased   MGF  Deceased   PGM  Deceased   PGF  Deceased   Son  Alive  No partnership data on file   Family History  Problem Relation Age of Onset   Hypertension Mother    Depression Mother    Heart attack Mother  Uterine cancer Mother    Diabetes Maternal Grandmother    Hypertension Paternal Grandmother    Allergies  Allergen Reactions   Paregoric Hives    REACTION: HIVES   Penicillins Rash    REACTION: RASH   Procaine Hcl Other (See Comments)      Review of Systems  Constitutional:  Negative for chills, fever and malaise/fatigue.  HENT:  Negative for congestion and hearing loss.   Eyes:  Negative for blurred vision and discharge.  Respiratory:  Negative for cough, sputum production and shortness of breath.   Cardiovascular:  Negative for chest pain, palpitations and leg swelling.  Gastrointestinal:  Negative for abdominal pain, blood in stool, constipation, diarrhea, heartburn, nausea and vomiting.  Genitourinary:  Negative for dysuria, frequency, hematuria and urgency.  Musculoskeletal:  Negative for back pain, falls and myalgias.  Skin:  Negative for rash.  Neurological:  Negative for dizziness, sensory change, loss of consciousness, weakness and headaches.  Endo/Heme/Allergies:  Negative for environmental allergies. Does not bruise/bleed easily.  Psychiatric/Behavioral:  Negative for depression and suicidal ideas. The patient is not nervous/anxious and does not have insomnia.       Objective:     BP 124/86 (BP Location: Right Arm, Patient Position: Sitting, Cuff Size: Large)   Pulse 82   Temp 98 F (36.7 C) (Oral)   Resp 18   Ht 5\' 8"  (1.727 m)   Wt 256 lb 12.8 oz (116.5 kg)   SpO2 93%   BMI 39.05 kg/m  BP  Readings from Last 3 Encounters:  01/28/23 124/86  08/13/22 134/80  07/15/22 130/68   Wt Readings from Last 3 Encounters:  01/28/23 256 lb 12.8 oz (116.5 kg)  08/13/22 252 lb (114.3 kg)  07/15/22 250 lb (113.4 kg)   SpO2 Readings from Last 3 Encounters:  01/28/23 93%  08/13/22 95%  07/15/22 93%      Physical Exam Vitals and nursing note reviewed.  Constitutional:      General: She is not in acute distress.    Appearance: Normal appearance. She is well-developed.  HENT:     Head: Normocephalic and atraumatic.     Mouth/Throat:     Pharynx: Posterior oropharyngeal erythema present.     Comments: Tongue red + thrush  Eyes:     General: No scleral icterus.       Right eye: No discharge.        Left eye: No discharge.  Cardiovascular:     Rate and Rhythm: Normal rate and regular rhythm.     Heart sounds: No murmur heard. Pulmonary:     Effort: Pulmonary effort is normal. No respiratory distress.     Breath sounds: Normal breath sounds.  Musculoskeletal:        General: Normal range of motion.     Cervical back: Normal range of motion and neck supple.     Right lower leg: No edema.     Left lower leg: No edema.  Skin:    General: Skin is warm and dry.  Neurological:     Mental Status: She is alert and oriented to person, place, and time.  Psychiatric:        Mood and Affect: Mood normal.        Behavior: Behavior normal.        Thought Content: Thought content normal.        Judgment: Judgment normal.      No results found for any visits on 01/28/23.  Last CBC Lab  Results  Component Value Date   WBC 9.2 08/13/2022   HGB 13.5 08/13/2022   HCT 41.2 08/13/2022   MCV 86.0 08/13/2022   MCH 28.2 08/13/2022   RDW 13.1 08/13/2022   PLT 271 08/13/2022   Last metabolic panel Lab Results  Component Value Date   GLUCOSE 82 08/13/2022   NA 140 08/13/2022   K 4.6 08/13/2022   CL 103 08/13/2022   CO2 28 08/13/2022   BUN 20 08/13/2022   CREATININE 0.80  08/13/2022   GFR 69.78 05/17/2022   CALCIUM 10.0 08/13/2022   PROT 7.1 08/13/2022   ALBUMIN 4.2 05/17/2022   BILITOT 0.7 08/13/2022   ALKPHOS 96 05/17/2022   AST 20 08/13/2022   ALT 18 08/13/2022   Last lipids Lab Results  Component Value Date   CHOL 185 08/13/2022   HDL 66 08/13/2022   LDLCALC 95 08/13/2022   LDLDIRECT 195.7 01/12/2010   TRIG 138 08/13/2022   CHOLHDL 2.8 08/13/2022   Last hemoglobin A1c Lab Results  Component Value Date   HGBA1C 5.8 12/30/2014   Last thyroid functions Lab Results  Component Value Date   TSH 1.13 08/13/2022   Last vitamin D Lab Results  Component Value Date   VD25OH 71 08/13/2022   Last vitamin B12 and Folate Lab Results  Component Value Date   VITAMINB12 408 08/13/2022   FOLATE 11.9 03/19/2009      The 10-year ASCVD risk score (Arnett DK, et al., 2019) is: 7%    Assessment & Plan:   Problem List Items Addressed This Visit   None Visit Diagnoses     Thrush    -  Primary   Relevant Medications   nystatin (MYCOSTATIN) 100000 UNIT/ML suspension     Assessment and Plan    Oral Thrush Likely secondary to inhaled corticosteroid use (Trelegy) and poor oral hygiene during a recent vertigo episode. Painful oral lesions present. -Prescribe Nystatin oral suspension, swish and spit four times daily. -Advise to rinse mouth with water after using Trelegy. -Advise to maintain good oral hygiene, including brushing teeth and tongue. -Check effectiveness of treatment and reassess if not improving.  Vertigo Recent episode, now resolved. No current plan needed as symptoms have resolved.        No follow-ups on file.    Donato Schultz, DO

## 2023-02-09 DIAGNOSIS — J9621 Acute and chronic respiratory failure with hypoxia: Secondary | ICD-10-CM | POA: Diagnosis not present

## 2023-02-09 DIAGNOSIS — G4733 Obstructive sleep apnea (adult) (pediatric): Secondary | ICD-10-CM | POA: Diagnosis not present

## 2023-02-15 ENCOUNTER — Other Ambulatory Visit: Payer: Self-pay | Admitting: Family Medicine

## 2023-03-12 DIAGNOSIS — J9621 Acute and chronic respiratory failure with hypoxia: Secondary | ICD-10-CM | POA: Diagnosis not present

## 2023-03-12 DIAGNOSIS — G4733 Obstructive sleep apnea (adult) (pediatric): Secondary | ICD-10-CM | POA: Diagnosis not present

## 2023-03-23 ENCOUNTER — Other Ambulatory Visit: Payer: Self-pay | Admitting: Cardiovascular Disease

## 2023-03-31 ENCOUNTER — Telehealth: Payer: Self-pay | Admitting: Student

## 2023-03-31 NOTE — Telephone Encounter (Signed)
Patient states needs refill for Trelegy. Pharmacy is [facility] [address] [place]. Patient phone number is [phone].

## 2023-04-01 MED ORDER — TRELEGY ELLIPTA 200-62.5-25 MCG/ACT IN AEPB: 1.0000 | INHALATION_SPRAY | Freq: Every day | RESPIRATORY_TRACT | 3 refills | Status: AC

## 2023-04-01 NOTE — Telephone Encounter (Signed)
Trelegy has been sent to preferred pharmacy for Veronica Solomon. Mychart message has been sent to Veronica Solomon letting her know this was done and and also stated to Veronica Solomon that we needed her to be set up with a new pulmonologist as she is a former Dr. Thora Lance Veronica Solomon.

## 2023-04-05 ENCOUNTER — Other Ambulatory Visit: Payer: Self-pay | Admitting: Family Medicine

## 2023-04-05 ENCOUNTER — Encounter: Payer: Self-pay | Admitting: Family Medicine

## 2023-04-05 DIAGNOSIS — G894 Chronic pain syndrome: Secondary | ICD-10-CM

## 2023-04-05 MED ORDER — PREGABALIN 75 MG PO CAPS: ORAL_CAPSULE | ORAL | 2 refills | Status: DC

## 2023-04-05 NOTE — Telephone Encounter (Signed)
Requesting: Lyrica 75mg  Contract: 05/14/22 UDS: 05/14/22 Last Visit: 01/28/23 Next Visit: None Last Refill: 06/15/22 #180 and 2RF   Please Advise

## 2023-04-26 ENCOUNTER — Encounter: Payer: Self-pay | Admitting: Family Medicine

## 2023-04-28 ENCOUNTER — Other Ambulatory Visit: Payer: Self-pay | Admitting: Family Medicine

## 2023-04-28 DIAGNOSIS — G4733 Obstructive sleep apnea (adult) (pediatric): Secondary | ICD-10-CM

## 2023-04-28 DIAGNOSIS — J449 Chronic obstructive pulmonary disease, unspecified: Secondary | ICD-10-CM

## 2023-05-19 DIAGNOSIS — J432 Centrilobular emphysema: Secondary | ICD-10-CM | POA: Diagnosis not present

## 2023-05-19 DIAGNOSIS — Z87891 Personal history of nicotine dependence: Secondary | ICD-10-CM | POA: Diagnosis not present

## 2023-05-19 DIAGNOSIS — G4733 Obstructive sleep apnea (adult) (pediatric): Secondary | ICD-10-CM | POA: Diagnosis not present

## 2023-05-19 DIAGNOSIS — J449 Chronic obstructive pulmonary disease, unspecified: Secondary | ICD-10-CM | POA: Diagnosis not present

## 2023-05-19 DIAGNOSIS — Z6835 Body mass index (BMI) 35.0-35.9, adult: Secondary | ICD-10-CM | POA: Diagnosis not present

## 2023-06-28 ENCOUNTER — Other Ambulatory Visit: Payer: Self-pay | Admitting: Family Medicine

## 2023-06-28 DIAGNOSIS — G894 Chronic pain syndrome: Secondary | ICD-10-CM

## 2023-06-29 MED ORDER — HYDROCODONE-ACETAMINOPHEN 10-325 MG PO TABS: 1.0000 | ORAL_TABLET | Freq: Three times a day (TID) | ORAL | 0 refills | Status: DC | PRN

## 2023-06-29 NOTE — Telephone Encounter (Signed)
 Requesting: Norco 10-325 Contract: 05/14/2022 UDS: 05/14/2022 Last Visit: 01/28/2023 Next Visit: N/A Last Refill: 12/15/2022  Please Advise

## 2023-07-01 ENCOUNTER — Telehealth: Payer: Medicare PPO | Admitting: Family Medicine

## 2023-07-01 ENCOUNTER — Ambulatory Visit
Admission: RE | Admit: 2023-07-01 | Discharge: 2023-07-01 | Disposition: A | Discharge status: Home / Self Care | Payer: Medicare PPO | Source: Ambulatory Visit | Attending: Internal Medicine

## 2023-07-01 VITALS — BP 158/95 | HR 91 | Temp 97.8°F | Resp 19

## 2023-07-01 DIAGNOSIS — J9611 Chronic respiratory failure with hypoxia: Secondary | ICD-10-CM

## 2023-07-01 DIAGNOSIS — R0602 Shortness of breath: Secondary | ICD-10-CM

## 2023-07-01 DIAGNOSIS — J101 Influenza due to other identified influenza virus with other respiratory manifestations: Secondary | ICD-10-CM

## 2023-07-01 DIAGNOSIS — J208 Acute bronchitis due to other specified organisms: Secondary | ICD-10-CM | POA: Diagnosis not present

## 2023-07-01 LAB — POC COVID19/FLU A&B COMBO
Covid Antigen, POC: NEGATIVE
Influenza A Antigen, POC: POSITIVE — AB
Influenza B Antigen, POC: NEGATIVE

## 2023-07-01 MED ORDER — IPRATROPIUM-ALBUTEROL 0.5-2.5 (3) MG/3ML IN SOLN
3.0000 mL | Freq: Once | RESPIRATORY_TRACT | Status: AC
  Administered 2023-07-01: 3 mL via RESPIRATORY_TRACT

## 2023-07-01 MED ORDER — ONDANSETRON 4 MG PO TBDP
4.0000 mg | ORAL_TABLET | Freq: Once | ORAL | Status: AC
  Administered 2023-07-01: 4 mg via ORAL

## 2023-07-01 MED ORDER — ONDANSETRON 4 MG PO TBDP: 4.0000 mg | ORAL_TABLET | Freq: Three times a day (TID) | ORAL | 0 refills | Status: DC | PRN

## 2023-07-01 MED ORDER — OSELTAMIVIR PHOSPHATE 75 MG PO CAPS: 75.0000 mg | ORAL_CAPSULE | Freq: Two times a day (BID) | ORAL | 0 refills | Status: DC

## 2023-07-01 MED ORDER — PREDNISONE 20 MG PO TABS: 40.0000 mg | ORAL_TABLET | Freq: Every day | ORAL | 0 refills | Status: AC

## 2023-07-01 NOTE — Progress Notes (Signed)
  Because of your shortness of breath and chest tightness with your history your condition warrants further evaluation and I recommend that you be seen in a face-to-face visit at a PCP office or Urgent Care.   NOTE: There will be NO CHARGE for this E-Visit   If you are having a true medical emergency, please call 911.

## 2023-07-01 NOTE — Discharge Instructions (Addendum)
You have the flu. Take tamiflu every 12 hours for the next 5 days. Take prednisone once daily for the next 5 days to treat inflammation to the lungs.  Take zofran every 8 hours as needed for nausea/vomiting. Mucinex can help to break up mucous making it easier to cough it up.  Albuterol inhaler every 4-6 hours as needed for shortness of breath/wheeze.   If you become more short of breath or if nausea/vomiting does not respond well to zofran, please go to the ER.  Otherwise, please follow-up with your primary care provider in the next 3 days for re-check.  I hope you feel better soon!

## 2023-07-01 NOTE — ED Triage Notes (Signed)
Pt c/o sore throat, body aches, cough, and congestion for 1 day. States she watched granddaughter yesterday and she was positive for flu

## 2023-07-01 NOTE — ED Provider Notes (Signed)
[facility]    CSN: [id number] Arrival date & time: 07/01/23  1333      History   Chief Complaint Chief Complaint  Patient presents with  . Influenza    Dr said to come in and get tested for viruses and have breathing and chest checked because of other conditions. Granddaughter tested positive for type flu a. I babysat her yesterday. - Entered by patient    HPI Veronica Solomon is a 68 y.o. female.   Veronica Solomon is a 67 y.o. female presenting for chief complaint of cough, congestion, body aches, shortness of breath, and chest pain associated with coughing that started yesterday. She was exposed to her granddaughter yesterday who tested positive for Flu A. Cough is dry and she reports bilateral chest discomfort associated with cough. History of COPD and chronic respiratory failure with hypoxia, wears oxygen at nighttime. She did not wear her oxygen last night and reports worsening shortness of breath starting today. Additionally reports nausea, vomiting, and diarrhea started today. She has had 1-2 loose stools (no blood/mucous) and 1-2 episodes of non-bloody/non-bilious emesis today. Former smoker, denies drug use. She is currently nauseous. She has not attempted use of any OTC medicines to help with symptoms PTA.    Influenza   Past Medical History:  Diagnosis Date  . Adenomatous colon polyp   . Allergy   . C. difficile colitis   . Cataract 2020  . COPD (chronic obstructive pulmonary disease) (HCC) 2020  . Depression 2010  . Diverticulosis   . Emphysema of lung (HCC) 2022  . GERD (gastroesophageal reflux disease) 2020  . Heart murmur 2022  . Hyperlipidemia   . Hypertension   . IBS (irritable bowel syndrome)   . Nasal congestion 06/14/1998  . Neuromuscular disorder (HCC)   . Oxygen deficiency 2020  . Sleep apnea 2020  . Substance abuse (HCC) 1974  . Ulcer 1964    Patient Active Problem List   Diagnosis Date Noted  . HOCM (hypertrophic  obstructive cardiomyopathy) (HCC) 08/13/2022  . Moderate major depression (HCC) 08/13/2022  . Chronic respiratory failure with hypoxia (HCC) 08/13/2022  . Achilles tendinitis of right lower extremity 05/14/2022  . Cardiac murmur 08/03/2021  . Vitamin D deficiency 04/13/2021  . B12 deficiency 04/13/2021  . Insect bite of right lower leg 12/18/2020  . Chronic bronchitis (HCC) 12/18/2020  . COVID-19 11/24/2020  . Acute bronchitis with COPD (HCC) 11/04/2020  . Sinusitis, bacterial 11/04/2020  . Candida vaginitis 11/04/2020  . Bronchitis 11/04/2020  . Pulmonary emphysema (HCC) 07/11/2020  . Current mild episode of major depressive disorder (HCC) 04/26/2019  . CAD (coronary artery disease) 03/07/2018  . Sensorineural hearing loss (SNHL), 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  . Tinnitus, 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  . Chronic swimmer's ear of both sides 02/22/2018  . Skin tag 12/27/2016  . Chronic daily headache 09/01/2016  . Memory loss due to medical condition 06/30/2016  . Hyperlipidemia 12/30/2014  . Obesity (BMI 30-39.9) 06/25/2013  . Numerous moles 11/24/2012  . Chronic pain syndrome 09/29/2010  . COUGH 12/04/2009  . Constipation 07/18/2009  . Myalgia and myositis, unspecified 04/14/2009  . DEPRESSION 03/19/2009  . PARESTHESIA 03/19/2009  . Infectious diarrhea 03/18/2009  . DIARRHEA 03/18/2009  . History of colonic polyps 03/18/2009  . Fatigue 03/10/2009  . Primary hypertension 02/28/2009  . IBS 02/14/2009  . Asymptomatic postmenopausal status 12/30/2008  . 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 04/01/2008  . Headache 04/01/2008  . ACUTE BRONCHITIS 03/07/2008  . Acute  upper respiratory infection 07/10/2007    Past Surgical History:  Procedure Laterality Date  . ABDOMINAL HYSTERECTOMY  06/14/2000   fibroids  . EYE SURGERY  2020  . PLANTAR FASCIECTOMY    . TUBAL LIGATION  1990    OB History   No obstetric history on file.      Home Medications    Prior to Admission  medications   Medication Sig Start Date End Date Taking? Authorizing Provider  oseltamivir (TAMIFLU) 75 MG capsule Take 1 capsule (75 mg total) by mouth every 12 (twelve) hours. 07/01/23  Yes Carlisle Beers, FNP  albuterol (VENTOLIN HFA) 108 (90 Base) MCG/ACT inhaler Inhale 2 puffs into the lungs every 6 (six) hours as needed for wheezing or shortness of breath. 11/27/20   Seabron Spates R, DO  atorvastatin (LIPITOR) 20 MG tablet TAKE 1 TABLET(20 MG) BY MOUTH DAILY 02/15/23   Zola Button, Grayling Congress, DO  celecoxib (CELEBREX) 200 MG capsule Take 1 capsule (200 mg total) by mouth 2 (two) times daily as needed for moderate pain. 03/19/21   Seabron Spates R, DO  DULoxetine (CYMBALTA) 60 MG capsule TAKE 2 CAPSULES BY MOUTH DAILY 01/18/23   Zola Button, Grayling Congress, DO  HYDROcodone-acetaminophen (NORCO) 10-325 MG tablet Take 1 tablet by mouth every 8 (eight) hours as needed. 06/29/23   Donato Schultz, DO  meclizine (ANTIVERT) 50 MG tablet Take 1 tablet (50 mg total) by mouth 3 (three) times daily as needed. 11/22/21   Junie Spencer, FNP  meloxicam (MOBIC) 15 MG tablet Take 1 tablet (15 mg total) by mouth daily. 05/24/22   Richardean Sale, DO  metoprolol tartrate (LOPRESSOR) 25 MG tablet TAKE 1 TABLET(25 MG) BY MOUTH TWICE DAILY 03/25/23   O'Neal, Ronnald Ramp, MD  nystatin (MYCOSTATIN) 100000 UNIT/ML suspension Swish and spit 5 mLs (500,000 Units total) by mouth 4 (four) times daily. 01/28/23   Donato Schultz, DO  pregabalin (LYRICA) 75 MG capsule 1 po bid 04/05/23   Zola Button, Grayling Congress, DO  traMADol (ULTRAM) 50 MG tablet TAKE 1 TABLET(50 MG) BY MOUTH EVERY 6 HOURS AS NEEDED 12/28/21   Zola Button, Yvonne R, DO  TRELEGY ELLIPTA 200-62.5-25 MCG/ACT AEPB Inhale 1 puff into the lungs daily. 04/01/23   Bevelyn Ngo, NP  Vitamin D, Ergocalciferol, (DRISDOL) 1.25 MG (50000 UNIT) CAPS capsule TAKE 1 CAPSULE BY MOUTH EVERY 7 DAYS 08/23/22   Donato Schultz, DO    Family  History Family History  Problem Relation Age of Onset  . Hypertension Mother   . Depression Mother   . Heart attack Mother   . Uterine cancer Mother   . Diabetes Maternal Grandmother   . Hypertension Paternal Grandmother     Social History Social History   Tobacco Use  . Smoking status: Former    Current packs/day: 0.00    Average packs/day: 1 pack/day for 42.0 years (42.0 ttl pk-yrs)    Types: Cigarettes    Start date: 12/28/1969    Quit date: 12/29/2011    Years since quitting: 11.5  . Smokeless tobacco: Former  . Tobacco comments:    Quit 12/2011  Vaping Use  . Vaping status: Never Used  Substance Use Topics  . Alcohol use: Yes    Comment: rarely  . Drug use: No     Allergies   Paregoric, Penicillins, and Procaine hcl   Review of Systems Review of Systems Per HPI  Physical Exam Triage Vital Signs  ED Triage Vitals  Encounter Vitals Group     BP 07/01/23 1343 (!) 158/95     Systolic BP Percentile --      Diastolic BP Percentile --      Pulse Rate 07/01/23 1343 91     Resp 07/01/23 1343 19     Temp 07/01/23 1343 97.8 F (36.6 C)     Temp Source 07/01/23 1343 Oral     SpO2 07/01/23 1343 (!) 89 %     Weight --      Height --      Head Circumference --      Peak Flow --      Pain Score 07/01/23 1344 10     Pain Loc --      Pain Education --      Exclude from Growth Chart --    No data found.  Updated Vital Signs BP (!) 158/95 (BP Location: Right Arm)   Pulse 91   Temp 97.8 F (36.6 C) (Oral)   Resp 19   SpO2 91%   Visual Acuity Right Eye Distance:   Left Eye Distance:   Bilateral Distance:    Right Eye Near:   Left Eye Near:    Bilateral Near:     Physical Exam Vitals and nursing note reviewed.  Constitutional:      Appearance: She is ill-appearing. She is not toxic-appearing.  HENT:     Head: Normocephalic and atraumatic.     Right Ear: Hearing, tympanic membrane, ear canal and external ear normal.     Left Ear: Hearing, tympanic  membrane, ear canal and external ear normal.     Nose: Congestion present.     Mouth/Throat:     Lips: Pink.     Mouth: Mucous membranes are moist. No injury or oral lesions.     Dentition: Normal dentition.     Tongue: No lesions.     Pharynx: Oropharynx is clear. Uvula midline. Posterior oropharyngeal erythema present. No pharyngeal swelling, oropharyngeal exudate, uvula swelling or postnasal drip.     Tonsils: No tonsillar exudate.  Eyes:     General: Lids are normal. Vision grossly intact. Gaze aligned appropriately.     Extraocular Movements: Extraocular movements intact.     Conjunctiva/sclera: Conjunctivae normal.  Neck:     Trachea: Trachea and phonation normal.  Cardiovascular:     Rate and Rhythm: Normal rate and regular rhythm.     Heart sounds: Normal heart sounds, S1 normal and S2 normal.  Pulmonary:     Effort: Pulmonary effort is normal. No respiratory distress.     Breath sounds: Normal breath sounds and air entry. No wheezing, rhonchi or rales.  Chest:     Chest wall: No tenderness.  Musculoskeletal:     Cervical back: Neck supple.     Right lower leg: No edema.     Left lower leg: No edema.  Lymphadenopathy:     Cervical: Cervical adenopathy present.  Skin:    General: Skin is warm and dry.     Capillary Refill: Capillary refill takes less than 2 seconds.     Findings: No rash.  Neurological:     General: No focal deficit present.     Mental Status: She is alert and oriented to person, place, and time. Mental status is at baseline.     Cranial Nerves: No dysarthria or facial asymmetry.  Psychiatric:        Mood and Affect: Mood normal.  Speech: Speech normal.        Behavior: Behavior normal.        Thought Content: Thought content normal.        Judgment: Judgment normal.     UC Treatments / Results  Labs (all labs ordered are listed, but only abnormal results are displayed) Labs Reviewed  POC COVID19/FLU A&B COMBO - Abnormal; Notable for the  following components:      Result Value   Influenza A Antigen, POC Positive (*)    All other components within normal limits    EKG   Radiology No results found.  Procedures Procedures (including critical care time)  Medications Ordered in UC Medications  ipratropium-albuterol (DUONEB) 0.5-2.5 (3) MG/3ML nebulizer solution 3 mL (3 mLs Nebulization Given 07/01/23 1412)  ondansetron (ZOFRAN-ODT) disintegrating tablet 4 mg (4 mg Oral Given 07/01/23 1411)    Initial Impression / Assessment and Plan / UC Course  I have reviewed the triage vital signs and the nursing notes.  Pertinent labs & imaging results that were available during my care of the patient were reviewed by me and considered in my medical decision making (see chart for details).     *** Final Clinical Impressions(s) / UC Diagnoses   Final diagnoses:  Influenza A   Discharge Instructions   None    ED Prescriptions     Medication Sig Dispense Auth. Provider   oseltamivir (TAMIFLU) 75 MG capsule Take 1 capsule (75 mg total) by mouth every 12 (twelve) hours. 10 capsule Carlisle Beers, FNP      PDMP not reviewed this encounter.

## 2023-08-01 ENCOUNTER — Ambulatory Visit
Admission: RE | Admit: 2023-08-01 | Discharge: 2023-08-01 | Disposition: A | Discharge status: Home / Self Care | Payer: Medicare PPO | Source: Ambulatory Visit | Attending: Family Medicine | Admitting: Family Medicine

## 2023-08-01 DIAGNOSIS — Z122 Encounter for screening for malignant neoplasm of respiratory organs: Secondary | ICD-10-CM

## 2023-08-01 DIAGNOSIS — Z87891 Personal history of nicotine dependence: Secondary | ICD-10-CM

## 2023-08-02 IMAGING — MG MM DIGITAL SCREENING BILAT W/ TOMO AND CAD
6 of 12 series · 6 of 36 positions shown · non-contrast
Comparison: Previous exam(s).

CLINICAL DATA: Screening.

EXAM:
DIGITAL SCREENING BILATERAL MAMMOGRAM WITH TOMOSYNTHESIS AND CAD
TECHNIQUE: Bilateral screening digital craniocaudal and mediolateral oblique
mammograms were obtained. Bilateral screening digital breast
tomosynthesis was performed. The images were evaluated with
computer-aided detection.

[R MLO synth-2D (1 of 2)]
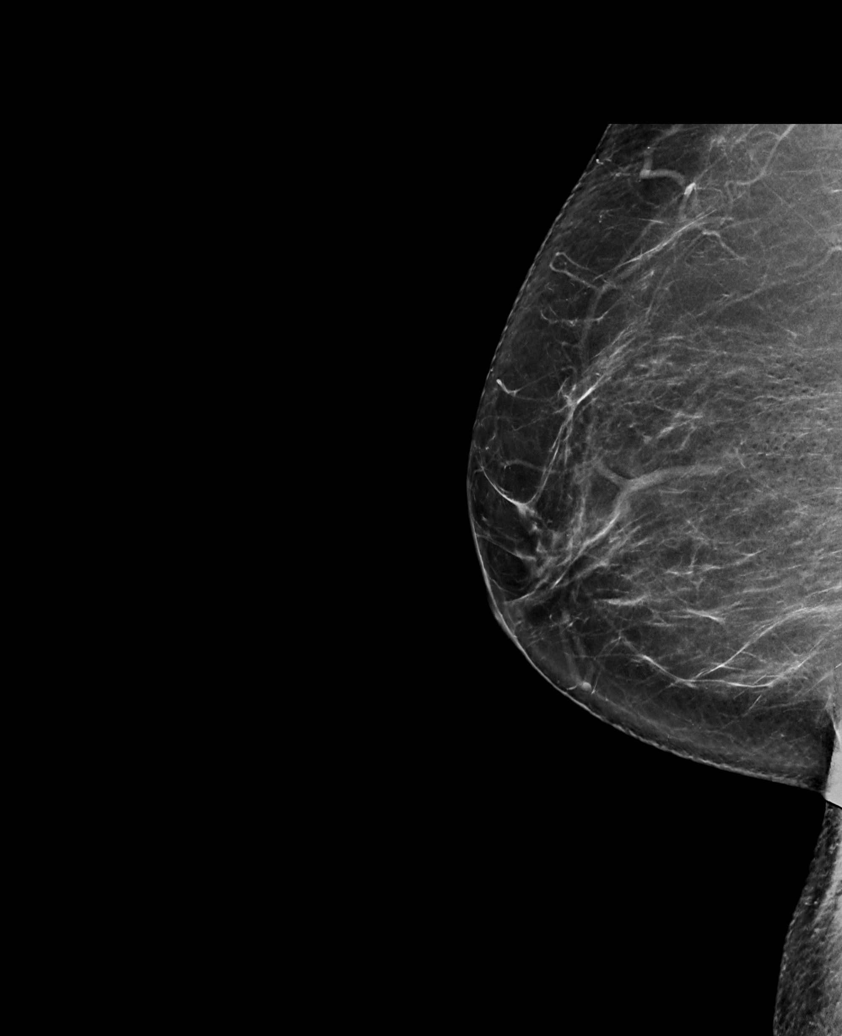

[L MLO synth-2D (1 of 2)]
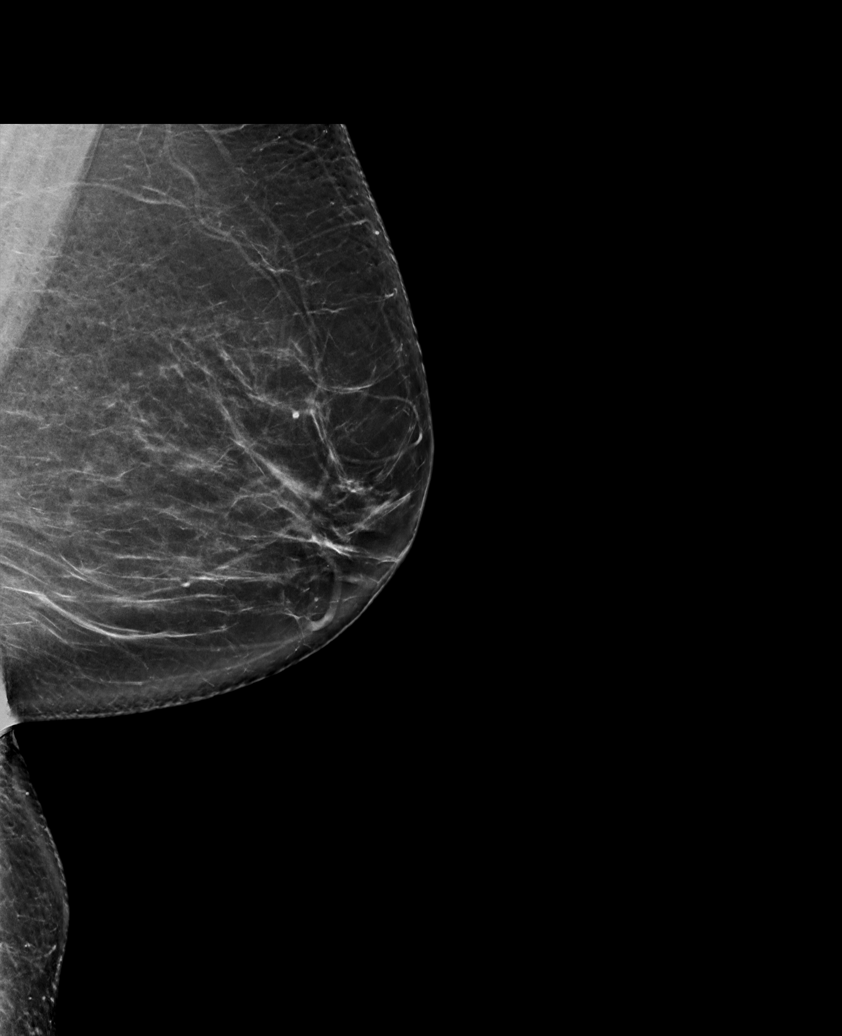

[R MLO synth-2D (2 of 2)]
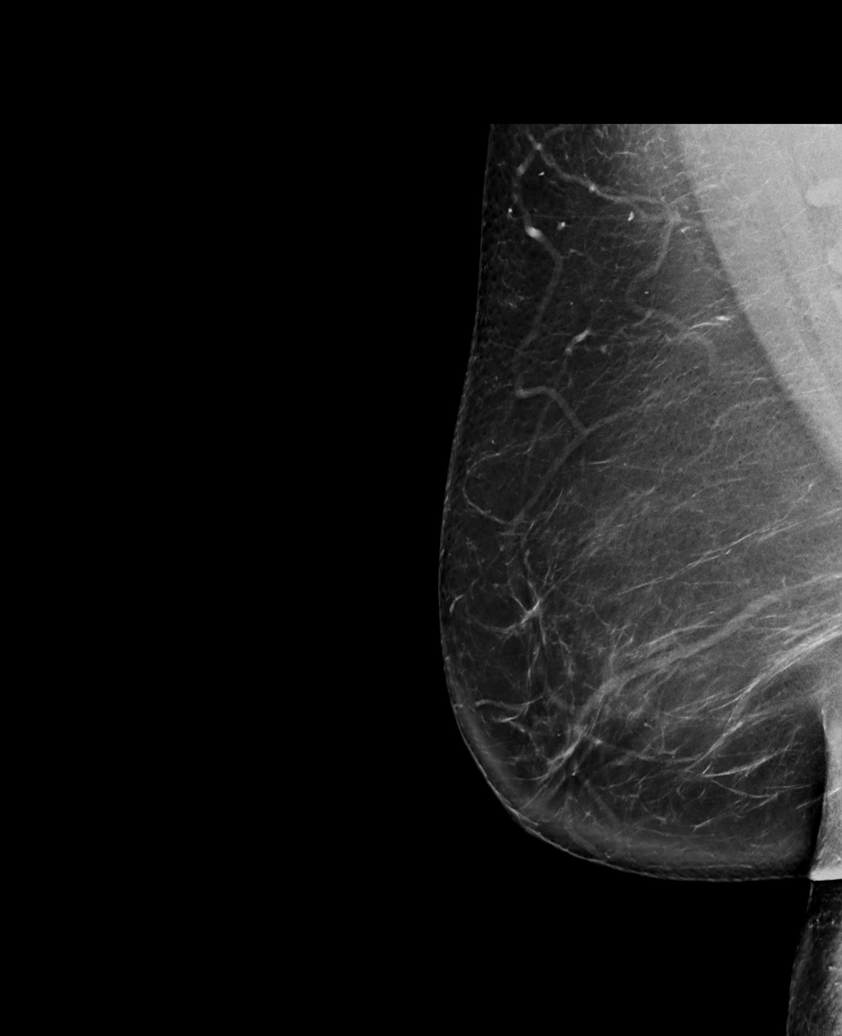

[L MLO synth-2D (2 of 2)]
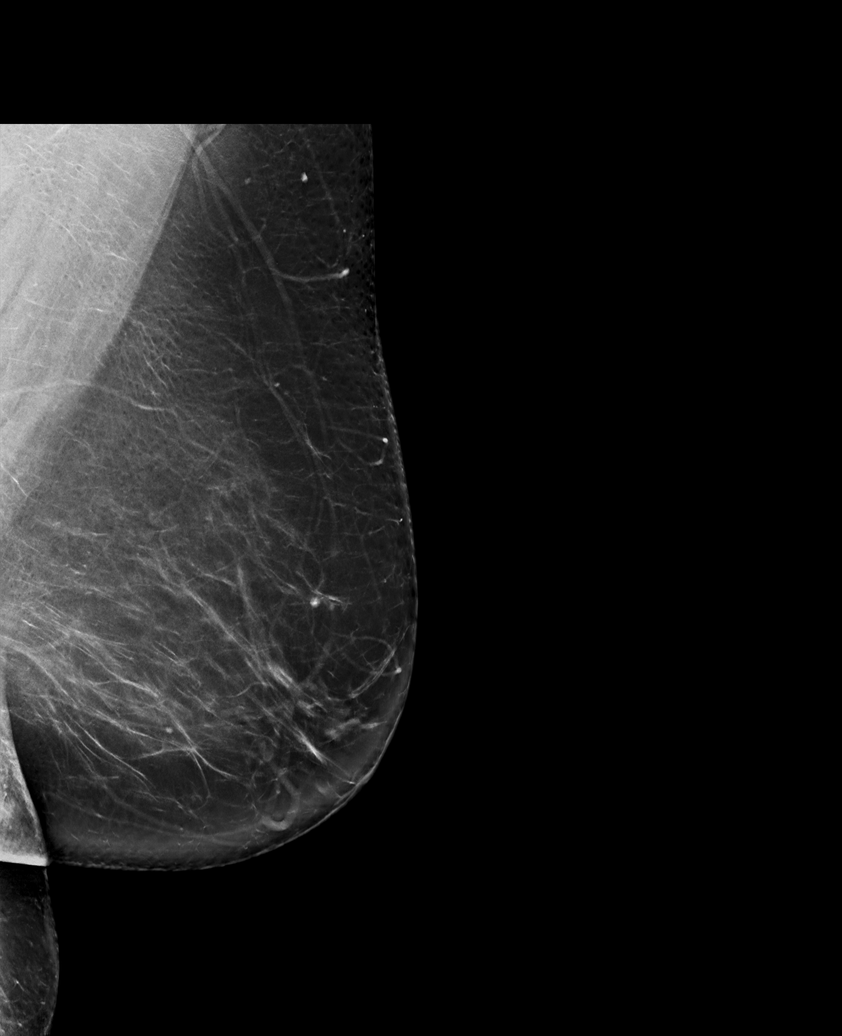

[R CC synth-2D]
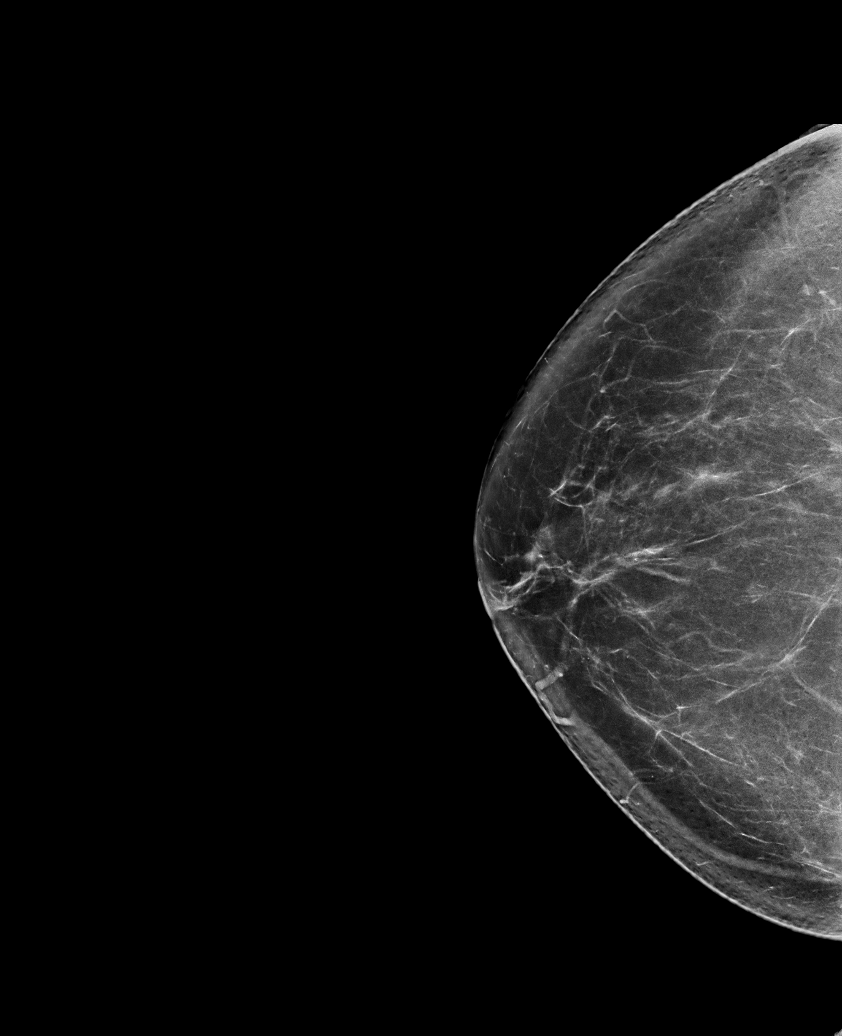

[L CC synth-2D]
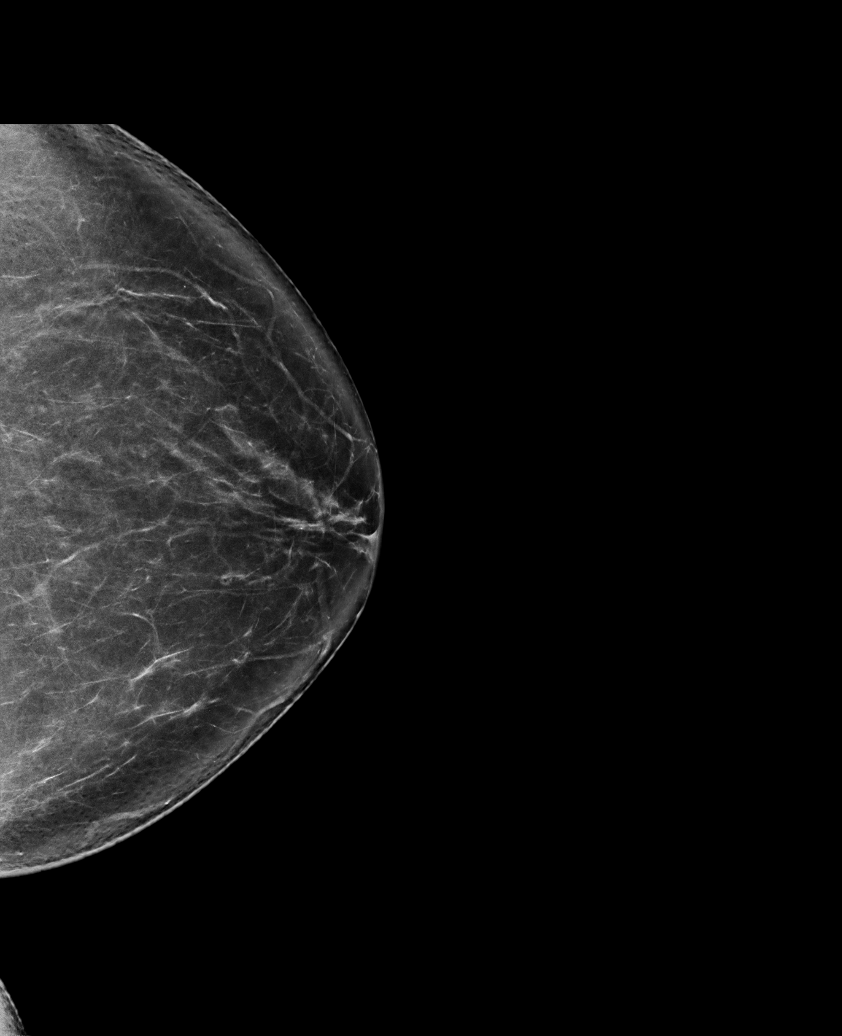

[6 of 36 positions shown; findings below may reference images not displayed]

ACR Breast Density Category b: There are scattered areas of
fibroglandular density.
FINDINGS: There are no findings suspicious for malignancy.
IMPRESSION: No mammographic evidence of malignancy. A result letter of this
screening mammogram will be mailed directly to the patient.

RECOMMENDATION:
Screening mammogram in one year. (Code:51-O-LD2)

BI-RADS CATEGORY  1: Negative.

## 2023-08-10 ENCOUNTER — Other Ambulatory Visit: Payer: Self-pay | Admitting: Family Medicine

## 2023-08-10 DIAGNOSIS — G894 Chronic pain syndrome: Secondary | ICD-10-CM

## 2023-08-11 MED ORDER — TRAMADOL HCL 50 MG PO TABS: ORAL_TABLET | ORAL | 0 refills | Status: DC

## 2023-08-24 ENCOUNTER — Telehealth: Payer: Self-pay | Admitting: Acute Care

## 2023-08-24 DIAGNOSIS — J449 Chronic obstructive pulmonary disease, unspecified: Secondary | ICD-10-CM | POA: Diagnosis not present

## 2023-08-24 NOTE — Telephone Encounter (Signed)
 I have reviewed the LDCT. Let's do a 6 month follow up scan.  Thanks so much

## 2023-08-24 NOTE — Telephone Encounter (Signed)
 Called patient and LVM. Request to call back and discuss recent CT results.

## 2023-08-25 ENCOUNTER — Other Ambulatory Visit: Payer: Self-pay

## 2023-08-25 DIAGNOSIS — Z87891 Personal history of nicotine dependence: Secondary | ICD-10-CM

## 2023-08-25 DIAGNOSIS — Z122 Encounter for screening for malignant neoplasm of respiratory organs: Secondary | ICD-10-CM

## 2023-08-25 DIAGNOSIS — R911 Solitary pulmonary nodule: Secondary | ICD-10-CM

## 2023-08-25 NOTE — Telephone Encounter (Signed)
 Spoke with patient and reviewed results. Advised she will need to repeat scan in 6 months. Patient agrees and scan has been scheduled for 02/23/2024 at [facility]. No additional questions. 6 Month order placed. Results and plan sent to PCP.

## 2023-08-25 NOTE — Telephone Encounter (Signed)
 Returned call and LVM to call office and review results.

## 2023-09-15 ENCOUNTER — Ambulatory Visit: Payer: Medicare PPO

## 2023-09-15 VITALS — Ht 68.0 in | Wt 256.0 lb

## 2023-09-15 DIAGNOSIS — Z1231 Encounter for screening mammogram for malignant neoplasm of breast: Secondary | ICD-10-CM | POA: Diagnosis not present

## 2023-09-15 DIAGNOSIS — Z0000 Encounter for general adult medical examination without abnormal findings: Secondary | ICD-10-CM | POA: Diagnosis not present

## 2023-09-15 DIAGNOSIS — Z1211 Encounter for screening for malignant neoplasm of colon: Secondary | ICD-10-CM

## 2023-09-15 NOTE — Progress Notes (Addendum)
 Subjective:   Veronica Solomon is a 68 y.o. who presents for a Medicare Wellness preventive visit.  Visit Complete: Virtual I connected with  [facility] on 09/15/23 by a video and audio enabled telemedicine application and verified that I am speaking with the correct person using two identifiers.  Patient Location: Home  Provider Location: Home Office  I discussed the limitations of evaluation and management by telemedicine. The patient expressed understanding and agreed to proceed.  Vital Signs: Because this visit was a virtual/telehealth visit, some criteria may be missing or patient reported. Any vitals not documented were not able to be obtained and vitals that have been documented are patient reported.    Persons Participating in Visit: Patient.  AWV Questionnaire: No: Patient Medicare AWV questionnaire was not completed prior to this visit.  Cardiac Risk Factors include: advanced age (>65men, >30 women);hypertension     Objective:    Today's Vitals   09/15/23 1308  Weight: 256 lb (116.1 kg)  Height: 5\' 8"  (1.727 m)   Body mass index is 38.92 kg/m.     09/15/2023    1:18 PM 08/26/2022    1:41 PM 02/11/2021    3:44 PM 03/08/2020   10:46 PM  Advanced Directives  Does Patient Have a Medical Advance Directive? No No No No  Would patient like information on creating a medical advance directive? No - Patient declined No - Patient declined Yes (MAU/Ambulatory/Procedural Areas - Information given)     Current Medications (verified) Outpatient Encounter Medications as of 09/15/2023  Medication Sig   albuterol (VENTOLIN HFA) 108 (90 Base) MCG/ACT inhaler Inhale 2 puffs into the lungs every 6 (six) hours as needed for wheezing or shortness of breath.   atorvastatin (LIPITOR) 20 MG tablet TAKE 1 TABLET(20 MG) BY MOUTH DAILY   celecoxib (CELEBREX) 200 MG capsule Take 1 capsule (200 mg total) by mouth 2 (two) times daily as needed for moderate pain.   DULoxetine  (CYMBALTA) 60 MG capsule TAKE 2 CAPSULES BY MOUTH DAILY   HYDROcodone-acetaminophen (NORCO) 10-325 MG tablet Take 1 tablet by mouth every 8 (eight) hours as needed.   meclizine (ANTIVERT) 50 MG tablet Take 1 tablet (50 mg total) by mouth 3 (three) times daily as needed.   meloxicam (MOBIC) 15 MG tablet Take 1 tablet (15 mg total) by mouth daily.   metoprolol tartrate (LOPRESSOR) 25 MG tablet TAKE 1 TABLET(25 MG) BY MOUTH TWICE DAILY   nystatin (MYCOSTATIN) 100000 UNIT/ML suspension Swish and spit 5 mLs (500,000 Units total) by mouth 4 (four) times daily.   ondansetron (ZOFRAN-ODT) 4 MG disintegrating tablet Take 1 tablet (4 mg total) by mouth every 8 (eight) hours as needed for nausea or vomiting.   oseltamivir (TAMIFLU) 75 MG capsule Take 1 capsule (75 mg total) by mouth every 12 (twelve) hours.   pregabalin (LYRICA) 75 MG capsule 1 po bid   traMADol (ULTRAM) 50 MG tablet TAKE 1 TABLET(50 MG) BY MOUTH EVERY 6 HOURS AS NEEDED   TRELEGY ELLIPTA 200-62.5-25 MCG/ACT AEPB Inhale 1 puff into the lungs daily.   Vitamin D, Ergocalciferol, (DRISDOL) 1.25 MG (50000 UNIT) CAPS capsule TAKE 1 CAPSULE BY MOUTH EVERY 7 DAYS   No facility-administered encounter medications on file as of 09/15/2023.    Allergies (verified) Paregoric, Penicillins, and Procaine hcl   History: Past Medical History:  Diagnosis Date   Adenomatous colon polyp    Allergy    C. difficile colitis    Cataract 2020   COPD (  chronic obstructive pulmonary disease) (HCC) 2020   Depression 2010   Diverticulosis    Emphysema of lung (HCC) 2022   GERD (gastroesophageal reflux disease) 2020   Heart murmur 2022   Hyperlipidemia    Hypertension    IBS (irritable bowel syndrome)    Nasal congestion 06/14/1998   Neuromuscular disorder (HCC)    Oxygen deficiency 2020   Sleep apnea 2020   Substance abuse (HCC) 1974   Ulcer 1964   Past Surgical History:  Procedure Laterality Date   ABDOMINAL HYSTERECTOMY  06/14/2000   fibroids    EYE SURGERY  2020   PLANTAR FASCIECTOMY     TUBAL LIGATION  1990   Family History  Problem Relation Age of Onset   Hypertension Mother    Depression Mother    Heart attack Mother    Uterine cancer Mother    Diabetes Maternal Grandmother    Hypertension Paternal Grandmother    Social History   Socioeconomic History   Marital status: Single    Spouse name: Not on file   Number of children: Not on file   Years of education: Not on file   Highest education level: Not on file  Occupational History   Occupation: self employed -- Archivist  Tobacco Use   Smoking status: Former    Current packs/day: 0.00    Average packs/day: 1 pack/day for 42.0 years (42.0 ttl pk-yrs)    Types: Cigarettes    Start date: 12/28/1969    Quit date: 12/29/2011    Years since quitting: 11.7   Smokeless tobacco: Former   Tobacco comments:    Quit 12/2011  Vaping Use   Vaping status: Never Used  Substance and Sexual Activity   Alcohol use: Yes    Comment: rarely   Drug use: No   Sexual activity: Not Currently  Other Topics Concern   Not on file  Social History Narrative   Exercise -- training dogs    Social Drivers of Health   Financial Resource Strain: Low Risk  (09/15/2023)   Overall Financial Resource Strain (CARDIA)    Difficulty of Paying Living Expenses: Not hard at all  Food Insecurity: No Food Insecurity (09/15/2023)   Hunger Vital Sign    Worried About Running Out of Food in the Last Year: Never true    Ran Out of Food in the Last Year: Never true  Transportation Needs: No Transportation Needs (09/15/2023)   PRAPARE - Administrator, Civil Service (Medical): No    Lack of Transportation (Non-Medical): No  Physical Activity: Insufficiently Active (09/15/2023)   Exercise Vital Sign    Days of Exercise per Week: 4 days    Minutes of Exercise per Session: 30 min  Stress: No Stress Concern Present (09/15/2023)   [employer] of Occupational Health - Occupational Stress  Questionnaire    Feeling of Stress : Not at all  Social Connections: Socially Isolated (09/15/2023)   Social Connection and Isolation Panel [NHANES]    Frequency of Communication with Friends and Family: More than three times a week    Frequency of Social Gatherings with Friends and Family: More than three times a week    Attends Religious Services: Never    Database administrator or Organizations: No    Attends Banker Meetings: Never    Marital Status: Divorced    Tobacco Counseling Counseling given: Not Answered Tobacco comments: Quit 12/2011    Clinical Intake:  Pre-visit preparation completed: Yes  Pain : No/denies pain     BMI - recorded: 38.92 Nutritional Status: BMI > 30  Obese Nutritional Risks: None Diabetes: No  Lab Results  Component Value Date   HGBA1C 5.8 12/30/2014     How often do you need to have someone help you when you read instructions, pamphlets, or other written materials from your doctor or pharmacy?: 1 - Never  Interpreter Needed?: No  Information entered by :: Theresa Mulligan LPN   Activities of Daily Living     09/15/2023    1:16 PM  In your present state of health, do you have any difficulty performing the following activities:  Hearing? 0  Vision? 0  Difficulty concentrating or making decisions? 0  Walking or climbing stairs? 0  Dressing or bathing? 0  Doing errands, shopping? 0  Preparing Food and eating ? N  Using the Toilet? N  In the past six months, have you accidently leaked urine? N  Do you have problems with loss of bowel control? N  Managing your Medications? N  Managing your Finances? N  Housekeeping or managing your Housekeeping? N    Patient Care Team: Pcp, No as PCP - General  Indicate any recent Medical Services you may have received from other than [facility] providers in the past year (date may be approximate).     Assessment:   This is a routine wellness examination for [facility].  Hearing/Vision  screen Hearing Screening - Comments:: Denies hearing difficulties   Vision Screening - Comments:: Wears rx glasses - up to date with routine eye exams with  Dr Arville Lime   Goals Addressed               This Visit's Progress     Lose weight (pt-stated)         Depression Screen     09/15/2023    1:15 PM 08/26/2022    1:45 PM 05/14/2022    1:55 PM 11/12/2021    1:41 PM 08/03/2021    1:45 PM 02/11/2021    3:48 PM 08/01/2020    1:30 PM  PHQ 2/9 Scores  PHQ - 2 Score 0 2 1 1  0 1 4  PHQ- 9 Score   3    21    Fall Risk     09/15/2023    1:17 PM 08/23/2022    3:17 PM 08/13/2022    1:25 PM 05/14/2022    1:40 PM 11/12/2021    1:41 PM  Fall Risk   Falls in the past year? 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 0 0 0 0 0  Risk for fall due to : No Fall Risks History of fall(s)     Follow up Falls prevention discussed;Falls evaluation completed Falls evaluation completed Falls evaluation completed Falls evaluation completed Falls evaluation completed    MEDICARE RISK AT HOME:  Medicare Risk at Home Any stairs in or around the home?: No If so, are there any without handrails?: No Home free of loose throw rugs in walkways, pet beds, electrical cords, etc?: Yes Adequate lighting in your home to reduce risk of falls?: Yes Life alert?: No Use of a cane, walker or w/c?: No Grab bars in the bathroom?: No Shower chair or bench in shower?: No Elevated toilet seat or a handicapped toilet?: No  TIMED UP AND GO:  Was the test  performed?  No  Cognitive Function: 6CIT completed    09/05/2017    3:00 PM  MMSE - Mini Mental State Exam  Orientation to time 5  Orientation to Place 5  Registration 3  Attention/ Calculation 5  Recall 3  Language- name 2 objects 2  Language- repeat 1  Language- follow 3 step command 3  Language- read & follow direction 1  Write a sentence 1  Copy design 1  Total score 30      09/01/2016    11:00 AM  Montreal Cognitive Assessment   Visuospatial/ Executive (0/5) 5  Naming (0/3) 3  Attention: Read list of digits (0/2) 2  Attention: Read list of letters (0/1) 1  Attention: Serial 7 subtraction starting at 100 (0/3) 3  Language: Repeat phrase (0/2) 2  Language : Fluency (0/1) 1  Abstraction (0/2) 2  Delayed Recall (0/5) 4  Orientation (0/6) 6  Total 29      09/15/2023    1:20 PM 08/26/2022    1:52 PM  6CIT Screen  What Year? 0 points 0 points  What month? 0 points 0 points  What time? 0 points 0 points  Count back from 20 0 points 0 points  Months in reverse 0 points 0 points  Repeat phrase 0 points 2 points  Total Score 0 points 2 points    Immunizations Immunization History  Administered Date(s) Administered   PFIZER(Purple Top)SARS-COV-2 Vaccination 09/04/2019, 09/27/2019   PNEUMOCOCCAL CONJUGATE-20 07/29/2021   Pfizer Covid-19 Vaccine Bivalent Booster 17yrs & up 08/03/2021   Pfizer(Comirnaty)Fall Seasonal Vaccine 12 years and older 05/14/2022   Respiratory Syncytial Virus Vaccine,Recomb Aduvanted(Arexvy) 05/14/2022   Td 01/10/2007   Tdap 02/03/2018, 06/30/2022   Zoster Recombinant(Shingrix) 04/26/2019, 07/05/2019    Screening Tests Health Maintenance  Topic Date Due   Colonoscopy  01/31/2019   MAMMOGRAM  03/11/2022   COVID-19 Vaccine (5 - 2024-25 season) 02/13/2023   INFLUENZA VACCINE  09/12/2027 (Originally 01/13/2024)   Lung Cancer Screening  07/31/2024   Medicare Annual Wellness (AWV)  09/14/2024   DTaP/Tdap/Td (4 - Td or Tdap) 06/30/2032   Pneumonia Vaccine 76+ Years old  Completed   DEXA SCAN  Completed   Hepatitis C Screening  Completed   Zoster Vaccines- Shingrix  Completed   HPV VACCINES  Aged Out    Health Maintenance  Health Maintenance Due  Topic Date Due   Colonoscopy  01/31/2019   MAMMOGRAM  03/11/2022   COVID-19 Vaccine (5 - 2024-25 season) 02/13/2023   Health Maintenance Items Addressed: Mammogram ordered, Cologuard  Ordered  Additional Screening:  Vision Screening: Recommended annual ophthalmology exams for early detection of glaucoma and other disorders of the eye.  Dental Screening: Recommended annual dental exams for proper oral hygiene  Community Resource Referral / Chronic Care Management: CRR required this visit?  No   CCM required this visit?  No     Plan:     I have personally reviewed and noted the following in the patient's chart:   Medical and social history Use of alcohol, tobacco or illicit drugs  Current medications and supplements including opioid prescriptions. Patient is currently taking opioid prescriptions. Information provided to patient regarding non-opioid alternatives. Patient advised to discuss non-opioid treatment plan with their provider. Functional ability and status Nutritional status Physical activity Advanced directives List of other physicians Hospitalizations, surgeries, and ER visits in previous 12 months Vitals Screenings to include cognitive, depression, and falls Referrals and appointments  In addition, I have reviewed and discussed  with patient certain preventive protocols, quality metrics, and best practice recommendations. A written personalized care plan for preventive services as well as general preventive health recommendations were provided to patient.     Tillie Rung, LPN   06/19/1094   After Visit Summary: (MyChart) Due to this being a telephonic visit, the after visit summary with patients personalized plan was offered to patient via MyChart   Notes: Nothing significant to report at this time.

## 2023-09-15 NOTE — Patient Instructions (Addendum)
 Veronica Solomon , Thank you for taking time to come for your Medicare Wellness Visit. I appreciate your ongoing commitment to your health goals. Please review the following plan we discussed and let me know if I can assist you in the future.   Referrals/Orders/Follow-Ups/Clinician Recommendations:   This is a list of the screening recommended for you and due dates:  Health Maintenance  Topic Date Due   Colon Cancer Screening  01/31/2019   Mammogram  03/11/2022   COVID-19 Vaccine (5 - 2024-25 season) 02/13/2023   Flu Shot  09/12/2027*   Screening for Lung Cancer  07/31/2024   Medicare Annual Wellness Visit  09/14/2024   DTaP/Tdap/Td vaccine (4 - Td or Tdap) 06/30/2032   Pneumonia Vaccine  Completed   DEXA scan (bone density measurement)  Completed   Hepatitis C Screening  Completed   Zoster (Shingles) Vaccine  Completed   HPV Vaccine  Aged Out  *Topic was postponed. The date shown is not the original due date.   Opioid Pain Medicine Management Opioids are powerful medicines that are used to treat moderate to severe pain. When used for short periods of time, they can help you to: Sleep better. Do better in physical or occupational therapy. Feel better in the first few days after an injury. Recover from surgery. Opioids should be taken with the supervision of a trained health care provider. They should be taken for the shortest period of time possible. This is because opioids can be addictive, and the longer you take opioids, the greater your risk of addiction. This addiction can also be called opioid use disorder. What are the risks? Using opioid pain medicines for longer than 3 days increases your risk of side effects. Side effects include: Constipation. Nausea and vomiting. Breathing difficulties (respiratory depression). Drowsiness. Confusion. Opioid use disorder. Itching. Taking opioid pain medicine for a long period of time can affect your ability to do daily tasks. It also puts  you at risk for: Motor vehicle crashes. Depression. Suicide. Heart attack. Overdose, which can be life-threatening. What is a pain treatment plan? A pain treatment plan is an agreement between you and your health care provider. Pain is unique to each person, and treatments vary depending on your condition. To manage your pain, you and your health care provider need to work together. To help you do this: Discuss the goals of your treatment, including how much pain you might expect to have and how you will manage the pain. Review the risks and benefits of taking opioid medicines. Remember that a good treatment plan uses more than one approach and minimizes the chance of side effects. Be honest about the amount of medicines you take and about any drug or alcohol use. Get pain medicine prescriptions from only one health care provider. Pain can be managed with many types of alternative treatments. Ask your health care provider to refer you to one or more specialists who can help you manage pain through: Physical or occupational therapy. Counseling (cognitive behavioral therapy). Good nutrition. Biofeedback. Massage. Meditation. Non-opioid medicine. Following a gentle exercise program. How to use opioid pain medicine Taking medicine Take your pain medicine exactly as told by your health care provider. Take it only when you need it. If your pain gets less severe, you may take less than your prescribed dose if your health care provider approves. If you are not having pain, do nottake pain medicine unless your health care provider tells you to take it. If your pain is severe, do  nottry to treat it yourself by taking more pills than instructed on your prescription. Contact your health care provider for help. Write down the times when you take your pain medicine. It is easy to become confused while on pain medicine. Writing the time can help you avoid overdose. Take other over-the-counter or  prescription medicines only as told by your health care provider. Keeping yourself and others safe  While you are taking opioid pain medicine: Do not drive, use machinery, or power tools. Do not sign legal documents. Do not drink alcohol. Do not take sleeping pills. Do not supervise children by yourself. Do not do activities that require climbing or being in high places. Do not go to a lake, river, ocean, spa, or swimming pool. Do not share your pain medicine with anyone. Keep pain medicine in a locked cabinet or in a secure area where pets and children cannot reach it. Stopping your use of opioids If you have been taking opioid medicine for more than a few weeks, you may need to slowly decrease (taper) how much you take until you stop completely. Tapering your use of opioids can decrease your risk of symptoms of withdrawal, such as: Pain and cramping in the abdomen. Nausea. Sweating. Sleepiness. Restlessness. Uncontrollable shaking (tremors). Cravings for the medicine. Do not attempt to taper your use of opioids on your own. Talk with your health care provider about how to do this. Your health care provider may prescribe a step-down schedule based on how much medicine you are taking and how long you have been taking it. Getting rid of leftover pills Do not save any leftover pills. Get rid of leftover pills safely by: Taking the medicine to a prescription take-back program. This is usually offered by the county or law enforcement. Bringing them to a pharmacy that has a drug disposal container. Flushing them down the toilet. Check the label or package insert of your medicine to see whether this is safe to do. Throwing them out in the trash. Check the label or package insert of your medicine to see whether this is safe to do. If it is safe to throw it out, remove the medicine from the original container, put it into a sealable bag or container, and mix it with used coffee grounds, food  scraps, dirt, or cat litter before putting it in the trash. Follow these instructions at home: Activity Do exercises as told by your health care provider. Avoid activities that make your pain worse. Return to your normal activities as told by your health care provider. Ask your health care provider what activities are safe for you. General instructions You may need to take these actions to prevent or treat constipation: Drink enough fluid to keep your urine pale yellow. Take over-the-counter or prescription medicines. Eat foods that are high in fiber, such as beans, whole grains, and fresh fruits and vegetables. Limit foods that are high in fat and processed sugars, such as fried or sweet foods. Keep all follow-up visits. This is important. Where to find support If you have been taking opioids for a long time, you may benefit from receiving support for quitting from a local support group or counselor. Ask your health care provider for a referral to these resources in your area. Where to find more information Centers for Disease Control and Prevention (CDC): FootballExhibition.com.br [place] Food and Drug Administration (FDA): PumpkinSearch.com.ee Get help right away if: You may have taken too much of an opioid (overdosed). Common symptoms of an overdose:  Your breathing is slower or more shallow than normal. You have a very slow heartbeat (pulse). You have slurred speech. You have nausea and vomiting. Your pupils become very small. You have other potential symptoms: You are very confused. You faint or feel like you will faint. You have cold, clammy skin. You have blue lips or fingernails. You have thoughts of harming yourself or harming others. These symptoms may represent a serious problem that is an emergency. Do not wait to see if the symptoms will go away. Get medical help right away. Call your local emergency services (911 in the [place] Do not drive yourself to the hospital.  If you ever feel like you may  hurt yourself or others, or have thoughts about taking your own life, get help right away. Go to your nearest emergency department or: Call your local emergency services (911 in the [place] Call the [facility] (704-193-0152 in the [place] Call a suicide crisis helpline, such as the National Suicide Prevention Lifeline at (623) 638-6940 or 988 in the [place] This is open 24 hours a day in the [place] If you're a Veteran: Call 988 and press 1. This is open 24 hours a day. Text the [employer] at 416-430-9931. Summary Opioid medicines can help you manage moderate to severe pain for a short period of time. A pain treatment plan is an agreement between you and your health care provider. Discuss the goals of your treatment, including how much pain you might expect to have and how you will manage the pain. If you think that you or someone else may have taken too much of an opioid, get medical help right away. This information is not intended to replace advice given to you by your health care provider. Make sure you discuss any questions you have with your health care provider. Document Revised: 03/07/2023 Document Reviewed: 09/10/2020 Elsevier Patient Education  2024 [employer] Advanced directives: (Provided) Advance directive discussed with you today. I have provided a copy for you to complete at home and have notarized. Once this is complete, please bring a copy in to our office so we can scan it into your chart.   Next Medicare Annual Wellness Visit scheduled for next year: Yes

## 2023-10-18 DIAGNOSIS — G4733 Obstructive sleep apnea (adult) (pediatric): Secondary | ICD-10-CM | POA: Diagnosis not present

## 2023-10-20 ENCOUNTER — Encounter: Payer: Self-pay | Admitting: Family Medicine

## 2023-11-03 ENCOUNTER — Other Ambulatory Visit: Payer: Self-pay | Admitting: Family Medicine

## 2023-11-03 DIAGNOSIS — G894 Chronic pain syndrome: Secondary | ICD-10-CM

## 2023-11-03 MED ORDER — PREGABALIN 75 MG PO CAPS: ORAL_CAPSULE | ORAL | 2 refills | Status: DC

## 2023-11-03 MED ORDER — DULOXETINE HCL 60 MG PO CPEP: 120.0000 mg | ORAL_CAPSULE | Freq: Every day | ORAL | 0 refills | Status: DC

## 2023-11-03 NOTE — Telephone Encounter (Signed)
 Copied from CRM [id number]. Topic: Clinical - Medication Refill >> Nov 03, 2023 11:47 AM Chuck Crater wrote: Medication: DULoxetine  (CYMBALTA ) 60 MG capsule and pregabalin  (LYRICA ) 75 MG capsule  Has the patient contacted their pharmacy? Yes (Agent: If no, request that the patient contact the pharmacy for the refill. If patient does not wish to contact the pharmacy document the reason why and proceed with request.) (Agent: If yes, when and what did the pharmacy advise?) no authorization from doctor   This is the patient's preferred pharmacy:   [id number] - [place], [place] - [address] [place] [address] Phone: [phone] Fax: [phone]  Is this the correct pharmacy for this prescription? Yes If no, delete pharmacy and type the correct one.   Has the prescription been filled recently? No  Is the patient out of the medication? Yes  Has the patient been seen for an appointment in the last year OR does the patient have an upcoming appointment? Yes  Can we respond through MyChart? Yes  Agent: Please be advised that Rx refills may take up to 3 business days. We ask that you follow-up with your pharmacy.

## 2023-11-03 NOTE — Telephone Encounter (Signed)
 Last Fill: Duloxetine : 01/18/23     Lyrica : 04/05/23  Last OV: 09/15/23 AWV Next OV: 11/10/23  Routing to provider for review/authorization.

## 2023-11-03 NOTE — Telephone Encounter (Signed)
 Requesting: Lyrica  75mg  Contract: 05/14/22 UDS: 05/14/22 Last Visit: 01/28/23 Next Visit: 11/10/23  Last Refill: 04/05/23 #180 and 2RF   Please Advise

## 2023-11-10 ENCOUNTER — Ambulatory Visit: Admitting: Family Medicine

## 2023-11-10 ENCOUNTER — Encounter: Payer: Self-pay | Admitting: Family Medicine

## 2023-11-10 VITALS — BP 128/70 | HR 59 | Temp 98.2°F | Resp 18 | Ht 68.0 in | Wt 247.0 lb

## 2023-11-10 DIAGNOSIS — R7303 Prediabetes: Secondary | ICD-10-CM | POA: Diagnosis not present

## 2023-11-10 DIAGNOSIS — E785 Hyperlipidemia, unspecified: Secondary | ICD-10-CM | POA: Diagnosis not present

## 2023-11-10 DIAGNOSIS — N3941 Urge incontinence: Secondary | ICD-10-CM | POA: Diagnosis not present

## 2023-11-10 DIAGNOSIS — E538 Deficiency of other specified B group vitamins: Secondary | ICD-10-CM

## 2023-11-10 DIAGNOSIS — G894 Chronic pain syndrome: Secondary | ICD-10-CM | POA: Diagnosis not present

## 2023-11-10 DIAGNOSIS — Z79899 Other long term (current) drug therapy: Secondary | ICD-10-CM | POA: Diagnosis not present

## 2023-11-10 DIAGNOSIS — I10 Essential (primary) hypertension: Secondary | ICD-10-CM | POA: Diagnosis not present

## 2023-11-10 DIAGNOSIS — J449 Chronic obstructive pulmonary disease, unspecified: Secondary | ICD-10-CM | POA: Diagnosis not present

## 2023-11-10 DIAGNOSIS — E559 Vitamin D deficiency, unspecified: Secondary | ICD-10-CM | POA: Diagnosis not present

## 2023-11-10 LAB — DRUG MONITORING PANEL 376104, URINE

## 2023-11-10 LAB — DM TEMPLATE

## 2023-11-10 MED ORDER — TRAMADOL HCL 50 MG PO TABS: ORAL_TABLET | ORAL | 0 refills | Status: AC

## 2023-11-10 MED ORDER — PREGABALIN 75 MG PO CAPS
ORAL_CAPSULE | ORAL | 2 refills | Status: DC
Start: 2023-11-10 — End: 2024-05-03

## 2023-11-10 MED ORDER — DULOXETINE HCL 60 MG PO CPEP
120.0000 mg | ORAL_CAPSULE | Freq: Every day | ORAL | Status: DC
Start: 2023-11-10 — End: 2024-01-30

## 2023-11-10 MED ORDER — MIRABEGRON ER 50 MG PO TB24: 50.0000 mg | ORAL_TABLET | Freq: Every day | ORAL | 2 refills | Status: DC

## 2023-11-10 MED ORDER — HYDROCODONE-ACETAMINOPHEN 10-325 MG PO TABS
1.0000 | ORAL_TABLET | Freq: Three times a day (TID) | ORAL | 0 refills | Status: DC | PRN
Start: 2023-11-10 — End: 2024-01-18

## 2023-11-10 NOTE — Progress Notes (Signed)
 Established Patient Office Visit  Subjective   Patient ID: Veronica Solomon, female    DOB: 1956-05-16  Age: 68 y.o. MRN: [id number]  Chief Complaint  Patient presents with   Hypertension   Hyperlipidemia   Follow-up    HPI Discussed the use of AI scribe software for clinical note transcription with the patient, who gave verbal consent to proceed.  History of Present Illness Veronica Solomon is a 68 year old female who presents for a medication check and management of incontinence and vertigo.  She requires paper prescriptions for controlled substances such as tramadol , Lyrica , and hydrocodone  due to a change in pharmacy to [facility] on [address] in [place]. She is currently taking Cymbalta , Trelegy, tramadol , Lyrica , metoprolol , and Celebrex , but has stopped taking meloxicam .  She experiences urinary incontinence characterized by an urgent need to urinate, resulting in leakage before reaching the bathroom. She has attempted Kegel exercises without significant improvement.  She has a history of vertigo that began approximately a year ago. Episodes can be severe enough to prevent walking, though these are infrequent. Milder episodes are described as 'swimmy headed'. She is uncertain about the timing of her medication, which takes 15-20 minutes to take effect and can cause drowsiness.  She works with dogs, including reactive ones, and has not been bitten in over a year. She recently adopted a 67 year old Yorkie with a heart murmur from a shelter. No swelling in her legs.   Patient Active Problem List   Diagnosis Date Noted   HOCM (hypertrophic obstructive cardiomyopathy) (HCC) 08/13/2022   Moderate major depression (HCC) 08/13/2022   Chronic respiratory failure with hypoxia (HCC) 08/13/2022   Achilles tendinitis of right lower extremity 05/14/2022   Cardiac murmur 08/03/2021   Vitamin D  deficiency 04/13/2021   B12 deficiency 04/13/2021   Insect bite of right  lower leg 12/18/2020   Chronic bronchitis (HCC) 12/18/2020   COVID-19 11/24/2020   Acute bronchitis with COPD (HCC) 11/04/2020   Sinusitis, bacterial 11/04/2020   Candida vaginitis 11/04/2020   Bronchitis 11/04/2020   Pulmonary emphysema (HCC) 07/11/2020   Current mild episode of major depressive disorder (HCC) 04/26/2019   CAD (coronary artery disease) 03/07/2018   Sensorineural hearing loss (SNHL), 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   Tinnitus, 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   Chronic swimmer's ear of both sides 02/22/2018   Skin tag 12/27/2016   Chronic daily headache 09/01/2016   Memory loss due to medical condition 06/30/2016   Hyperlipidemia 12/30/2014   Obesity (BMI 30-39.9) 06/25/2013   Numerous moles 11/24/2012   Chronic pain syndrome 09/29/2010   COUGH 12/04/2009   Constipation 07/18/2009   Myalgia and myositis, unspecified 04/14/2009   DEPRESSION 03/19/2009   PARESTHESIA 03/19/2009   Infectious diarrhea 03/18/2009   DIARRHEA 03/18/2009   History of colonic polyps 03/18/2009   Fatigue 03/10/2009   Primary hypertension 02/28/2009   IBS 02/14/2009   Asymptomatic postmenopausal status 12/30/2008   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 04/01/2008   Headache 04/01/2008   ACUTE BRONCHITIS 03/07/2008   Acute upper respiratory infection 07/10/2007   Past Medical History:  Diagnosis Date   Adenomatous colon polyp    Allergy    C. difficile colitis    Cataract 2020   COPD (chronic obstructive pulmonary disease) (HCC) 2020   Depression 2010   Diverticulosis    Emphysema of lung (HCC) 2022   GERD (gastroesophageal reflux disease) 2020   Heart murmur 2022   Hyperlipidemia    Hypertension    IBS (  irritable bowel syndrome)    Nasal congestion 06/14/1998   Neuromuscular disorder (HCC)    Oxygen  deficiency 2020   Sleep apnea 2020   Substance abuse (HCC) 1974   Ulcer 1964   Past Surgical History:  Procedure Laterality Date   ABDOMINAL HYSTERECTOMY  06/14/2000   fibroids   EYE  SURGERY  2020   PLANTAR FASCIECTOMY     TUBAL LIGATION  1990   Social History   Tobacco Use   Smoking status: Former    Current packs/day: 0.00    Average packs/day: 1 pack/day for 42.0 years (42.0 ttl pk-yrs)    Types: Cigarettes    Start date: 12/28/1969    Quit date: 12/29/2011    Years since quitting: 11.8   Smokeless tobacco: Former   Tobacco comments:    Quit 12/2011  Vaping Use   Vaping status: Never Used  Substance Use Topics   Alcohol use: Yes    Comment: rarely   Drug use: No   Social History   Socioeconomic History   Marital status: Single    Spouse name: Not on file   Number of children: Not on file   Years of education: Not on file   Highest education level: Not on file  Occupational History   Occupation: self employed -- Archivist  Tobacco Use   Smoking status: Former    Current packs/day: 0.00    Average packs/day: 1 pack/day for 42.0 years (42.0 ttl pk-yrs)    Types: Cigarettes    Start date: 12/28/1969    Quit date: 12/29/2011    Years since quitting: 11.8   Smokeless tobacco: Former   Tobacco comments:    Quit 12/2011  Vaping Use   Vaping status: Never Used  Substance and Sexual Activity   Alcohol use: Yes    Comment: rarely   Drug use: No   Sexual activity: Not Currently  Other Topics Concern   Not on file  Social History Narrative   Exercise -- training dogs    Social Drivers of Health   Financial Resource Strain: Low Risk  (09/15/2023)   Overall Financial Resource Strain (CARDIA)    Difficulty of Paying Living Expenses: Not hard at all  Food Insecurity: No Food Insecurity (09/15/2023)   Hunger Vital Sign    Worried About Running Out of Food in the Last Year: Never true    Ran Out of Food in the Last Year: Never true  Transportation Needs: No Transportation Needs (09/15/2023)   PRAPARE - Administrator, Civil Service (Medical): No    Lack of Transportation (Non-Medical): No  Physical Activity: Insufficiently Active (09/15/2023)    Exercise Vital Sign    Days of Exercise per Week: 4 days    Minutes of Exercise per Session: 30 min  Stress: No Stress Concern Present (09/15/2023)   [employer] of Occupational Health - Occupational Stress Questionnaire    Feeling of Stress : Not at all  Social Connections: Socially Isolated (09/15/2023)   Social Connection and Isolation Panel [NHANES]    Frequency of Communication with Friends and Family: More than three times a week    Frequency of Social Gatherings with Friends and Family: More than three times a week    Attends Religious Services: Never    Database administrator or Organizations: No    Attends Banker Meetings: Never    Marital Status: Divorced  Catering manager Violence: Not At Risk (09/15/2023)   Humiliation, Afraid, Rape,  and Kick questionnaire    Fear of Current or Ex-Partner: No    Emotionally Abused: No    Physically Abused: No    Sexually Abused: No   Family Status  Relation Name Status   Mother  Deceased   Father  Deceased   Sister  Alive   Daughter  Alive   MGM  Deceased   MGF  Deceased   PGM  Deceased   PGF  Deceased   Son  Alive  No partnership data on file   Family History  Problem Relation Age of Onset   Hypertension Mother    Depression Mother    Heart attack Mother    Uterine cancer Mother    Diabetes Maternal Grandmother    Hypertension Paternal Grandmother    Allergies  Allergen Reactions   Paregoric Hives    REACTION: HIVES   Penicillins Rash    REACTION: RASH   Procaine Hcl Other (See Comments)      Review of Systems  Constitutional:  Negative for chills, fever and malaise/fatigue.  HENT:  Negative for congestion and hearing loss.   Eyes:  Negative for blurred vision and discharge.  Respiratory:  Negative for cough, sputum production and shortness of breath.   Cardiovascular:  Negative for chest pain, palpitations and leg swelling.  Gastrointestinal:  Negative for abdominal pain, blood in stool,  constipation, diarrhea, heartburn, nausea and vomiting.  Genitourinary:  Negative for dysuria, frequency, hematuria and urgency.  Musculoskeletal:  Negative for back pain, falls and myalgias.  Skin:  Negative for rash.  Neurological:  Negative for dizziness, sensory change, loss of consciousness, weakness and headaches.  Endo/Heme/Allergies:  Negative for environmental allergies. Does not bruise/bleed easily.  Psychiatric/Behavioral:  Negative for depression and suicidal ideas. The patient is not nervous/anxious and does not have insomnia.       Objective:     BP 128/70 (BP Location: Left Arm, Patient Position: Sitting, Cuff Size: Large)   Pulse (!) 59   Temp 98.2 F (36.8 C) (Oral)   Resp 18   Ht 5\' 8"  (1.727 m)   Wt 247 lb (112 kg)   SpO2 94%   BMI 37.56 kg/m  BP Readings from Last 3 Encounters:  11/10/23 128/70  07/01/23 (!) 158/95  01/28/23 124/86   Wt Readings from Last 3 Encounters:  11/10/23 247 lb (112 kg)  09/15/23 256 lb (116.1 kg)  01/28/23 256 lb 12.8 oz (116.5 kg)   SpO2 Readings from Last 3 Encounters:  11/10/23 94%  07/01/23 94%  01/28/23 93%      Physical Exam Vitals and nursing note reviewed.  Constitutional:      General: She is not in acute distress.    Appearance: Normal appearance. She is well-developed.  HENT:     Head: Normocephalic and atraumatic.  Eyes:     General: No scleral icterus.       Right eye: No discharge.        Left eye: No discharge.  Cardiovascular:     Rate and Rhythm: Normal rate and regular rhythm.     Heart sounds: Murmur heard.  Pulmonary:     Effort: Pulmonary effort is normal. No respiratory distress.     Breath sounds: Normal breath sounds.  Musculoskeletal:        General: Normal range of motion.     Cervical back: Normal range of motion and neck supple.     Right lower leg: No edema.     Left lower leg:  No edema.  Skin:    General: Skin is warm and dry.  Neurological:     Mental Status: She is alert and  oriented to person, place, and time.  Psychiatric:        Mood and Affect: Mood normal.        Behavior: Behavior normal.        Thought Content: Thought content normal.        Judgment: Judgment normal.      No results found for any visits on 11/10/23.  Last CBC Lab Results  Component Value Date   WBC 9.2 08/13/2022   HGB 13.5 08/13/2022   HCT 41.2 08/13/2022   MCV 86.0 08/13/2022   MCH 28.2 08/13/2022   RDW 13.1 08/13/2022   PLT 271 08/13/2022   Last metabolic panel Lab Results  Component Value Date   GLUCOSE 82 08/13/2022   NA 140 08/13/2022   K 4.6 08/13/2022   CL 103 08/13/2022   CO2 28 08/13/2022   BUN 20 08/13/2022   CREATININE 0.80 08/13/2022   GFR 69.78 05/17/2022   CALCIUM  10.0 08/13/2022   PROT 7.1 08/13/2022   ALBUMIN 4.2 05/17/2022   BILITOT 0.7 08/13/2022   ALKPHOS 96 05/17/2022   AST 20 08/13/2022   ALT 18 08/13/2022   Last lipids Lab Results  Component Value Date   CHOL 185 08/13/2022   HDL 66 08/13/2022   LDLCALC 95 08/13/2022   LDLDIRECT 195.7 01/12/2010   TRIG 138 08/13/2022   CHOLHDL 2.8 08/13/2022   Last hemoglobin A1c Lab Results  Component Value Date   HGBA1C 5.8 12/30/2014   Last thyroid  functions Lab Results  Component Value Date   TSH 1.13 08/13/2022   Last vitamin D  Lab Results  Component Value Date   VD25OH 71 08/13/2022    The 10-year ASCVD risk score (Arnett DK, et al., 2019) is: 8.4%    Assessment & Plan:   Problem List Items Addressed This Visit       Unprioritized   B12 deficiency   Relevant Orders   Vitamin B12   Vitamin D  deficiency   Relevant Orders   VITAMIN D  25 Hydroxy (Vit-D Deficiency, Fractures)   Chronic pain syndrome   Relevant Medications   HYDROcodone -acetaminophen  (NORCO) 10-325 MG tablet   pregabalin  (LYRICA ) 75 MG capsule   traMADol  (ULTRAM ) 50 MG tablet   DULoxetine  (CYMBALTA ) 60 MG capsule   Primary hypertension   Relevant Orders   CBC with Differential/Platelet    Comprehensive metabolic panel with GFR   Lipid panel   TSH   Hyperlipidemia   Relevant Orders   Comprehensive metabolic panel with GFR   Lipid panel   TSH   Other Visit Diagnoses       Chronic obstructive pulmonary disease, unspecified COPD type (HCC)    -  Primary     Prediabetes       Relevant Orders   Hemoglobin A1c     Urge incontinence of urine       Relevant Medications   mirabegron  ER (MYRBETRIQ ) 50 MG TB24 tablet     High risk medication use       Relevant Orders   Drug Monitoring Panel [phone] , Urine     Assessment and Plan Assessment & Plan Urinary incontinence   Moderate urinary incontinence presents with sudden urgency and inability to hold urine, affecting daily activities. Myrbetriq  is discussed as a treatment option, with potential side effects like dry eyes. Insurance may require trying older  medications first. Prescribe Myrbetriq and verify insurance coverage. Report if the medication is cost-prohibitive or ineffective. Consider specialist referral if ineffective.  Vertigo   Intermittent vertigo occurs with episodes severe enough to impair walking. Medication takes 15-20 minutes to work and is recommended for prolonged or severe episodes. Take medication for severe episodes expected to last longer than a few minutes. Withhold medication for mild episodes unless persistent.    No follow-ups on file.    Deshawn Witty R Lowne Chase, DO

## 2023-11-30 ENCOUNTER — Encounter: Payer: Self-pay | Admitting: Family Medicine

## 2023-11-30 ENCOUNTER — Other Ambulatory Visit (HOSPITAL_BASED_OUTPATIENT_CLINIC_OR_DEPARTMENT_OTHER): Payer: Self-pay

## 2023-11-30 ENCOUNTER — Other Ambulatory Visit: Payer: Self-pay | Admitting: *Deleted

## 2023-11-30 MED ORDER — ATORVASTATIN CALCIUM 20 MG PO TABS
20.0000 mg | ORAL_TABLET | Freq: Every day | ORAL | 1 refills | Status: DC
  Filled 2023-11-30: qty 90, 90d supply, fill #0

## 2023-12-01 ENCOUNTER — Other Ambulatory Visit (HOSPITAL_BASED_OUTPATIENT_CLINIC_OR_DEPARTMENT_OTHER): Payer: Self-pay

## 2023-12-01 ENCOUNTER — Encounter: Payer: Self-pay | Admitting: Family Medicine

## 2023-12-01 MED ORDER — ATORVASTATIN CALCIUM 20 MG PO TABS: 20.0000 mg | ORAL_TABLET | Freq: Every day | ORAL | 1 refills | Status: DC

## 2023-12-09 ENCOUNTER — Encounter: Payer: Self-pay | Admitting: Family Medicine

## 2023-12-09 ENCOUNTER — Other Ambulatory Visit: Payer: Self-pay | Admitting: Family Medicine

## 2023-12-09 DIAGNOSIS — N3941 Urge incontinence: Secondary | ICD-10-CM

## 2023-12-09 MED ORDER — GEMTESA 75 MG PO TABS: ORAL_TABLET | ORAL | 11 refills | Status: AC

## 2024-01-03 ENCOUNTER — Other Ambulatory Visit: Payer: Self-pay | Admitting: Cardiovascular Disease

## 2024-01-17 ENCOUNTER — Encounter: Payer: Self-pay | Admitting: Family Medicine

## 2024-01-17 DIAGNOSIS — G894 Chronic pain syndrome: Secondary | ICD-10-CM

## 2024-01-18 ENCOUNTER — Other Ambulatory Visit (HOSPITAL_BASED_OUTPATIENT_CLINIC_OR_DEPARTMENT_OTHER): Payer: Self-pay

## 2024-01-18 ENCOUNTER — Other Ambulatory Visit: Payer: Self-pay | Admitting: Family Medicine

## 2024-01-18 DIAGNOSIS — G894 Chronic pain syndrome: Secondary | ICD-10-CM

## 2024-01-18 MED ORDER — HYDROCODONE-ACETAMINOPHEN 10-325 MG PO TABS
1.0000 | ORAL_TABLET | Freq: Three times a day (TID) | ORAL | 0 refills | Status: AC | PRN
  Filled 2024-01-18: qty 30, 10d supply, fill #0

## 2024-01-18 MED ORDER — HYDROCODONE-ACETAMINOPHEN 10-325 MG PO TABS: 1.0000 | ORAL_TABLET | Freq: Three times a day (TID) | ORAL | 0 refills | Status: DC | PRN

## 2024-01-18 NOTE — Telephone Encounter (Signed)
 Requesting: NORCO Contract: 11/10/23 UDS:    Last OV: 11/10/2023 Next OV: n/a Last Refill: 11/10/2023, #30--0 Rf Database:   Please advise

## 2024-01-28 ENCOUNTER — Other Ambulatory Visit: Payer: Self-pay | Admitting: Family Medicine

## 2024-01-28 DIAGNOSIS — G894 Chronic pain syndrome: Secondary | ICD-10-CM

## 2024-02-22 ENCOUNTER — Ambulatory Visit (HOSPITAL_BASED_OUTPATIENT_CLINIC_OR_DEPARTMENT_OTHER)
Admission: RE | Admit: 2024-02-22 | Discharge: 2024-02-22 | Disposition: A | Discharge status: Home / Self Care | Source: Ambulatory Visit | Attending: Acute Care | Admitting: Acute Care

## 2024-02-22 DIAGNOSIS — J432 Centrilobular emphysema: Secondary | ICD-10-CM | POA: Diagnosis not present

## 2024-02-22 DIAGNOSIS — Z87891 Personal history of nicotine dependence: Secondary | ICD-10-CM | POA: Diagnosis not present

## 2024-02-22 DIAGNOSIS — I700 Atherosclerosis of aorta: Secondary | ICD-10-CM | POA: Diagnosis not present

## 2024-02-22 DIAGNOSIS — Z122 Encounter for screening for malignant neoplasm of respiratory organs: Secondary | ICD-10-CM | POA: Insufficient documentation

## 2024-02-22 DIAGNOSIS — R911 Solitary pulmonary nodule: Secondary | ICD-10-CM | POA: Diagnosis not present

## 2024-03-05 ENCOUNTER — Other Ambulatory Visit: Payer: Self-pay | Admitting: Family Medicine

## 2024-03-05 ENCOUNTER — Telehealth: Payer: Self-pay | Admitting: *Deleted

## 2024-03-05 ENCOUNTER — Telehealth: Payer: Self-pay | Admitting: Acute Care

## 2024-03-05 DIAGNOSIS — R918 Other nonspecific abnormal finding of lung field: Secondary | ICD-10-CM

## 2024-03-05 NOTE — Telephone Encounter (Signed)
 I have called the patient with the results of her low dose Ct Chest. Her scan was read as a LR 4 B. Previously a LR 2 in 07/2023, but we were concerned about short term interval growth, so scheduled a 6 month follow up.This was read as a 4 B. 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 The ground-glass component has slowly increased in size over time, now measuring approximately 16.3 mm, previously 8.4 mm on 03/03/2018. I have reviewed this with the patient. Plan is for a PET scan and follow up with Dr. Shelah or Lauraine NP to go over the results.Pt. is in agreement with this plan.  Please fax results to PCP and let them know plan of care/ PET scan has been ordered.

## 2024-03-05 NOTE — Telephone Encounter (Signed)
 Call report from [facility]:  IMPRESSION: 1. 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 Lung-RADS 4B, suspicious. Additional imaging evaluation or consultation with Pulmonology or Thoracic Surgery recommended. These results will be called to the ordering clinician or representative by the Radiologist Assistant, and communication documented in the PACS or [employer]. 2. Aortic atherosclerosis (ICD10-I70.0). Coronary artery calcification. 3.  Emphysema (ICD10-J43.9).

## 2024-03-05 NOTE — Telephone Encounter (Signed)
 Results and plan to PCP. Reminder set to confirm PET and OV completed.

## 2024-03-08 NOTE — Telephone Encounter (Signed)
 See provider note 03/05/2024

## 2024-03-12 ENCOUNTER — Encounter (HOSPITAL_COMMUNITY)
Admission: RE | Admit: 2024-03-12 | Discharge: 2024-03-12 | Disposition: A | Discharge status: Home / Self Care | Source: Ambulatory Visit | Attending: Acute Care | Admitting: Acute Care

## 2024-03-12 DIAGNOSIS — R918 Other nonspecific abnormal finding of lung field: Secondary | ICD-10-CM | POA: Insufficient documentation

## 2024-03-12 DIAGNOSIS — R911 Solitary pulmonary nodule: Secondary | ICD-10-CM | POA: Diagnosis not present

## 2024-03-12 LAB — GLUCOSE, CAPILLARY: Glucose-Capillary: 105 mg/dL — ABNORMAL HIGH (ref 70–99)

## 2024-03-12 MED ORDER — FLUDEOXYGLUCOSE F - 18 (FDG) INJECTION
12.2900 | Freq: Once | INTRAVENOUS | Status: AC | PRN
Start: 2024-03-12 — End: 2024-03-12
  Administered 2024-03-12: 12.29 via INTRAVENOUS

## 2024-03-20 ENCOUNTER — Ambulatory Visit: Admitting: Acute Care

## 2024-03-21 ENCOUNTER — Encounter: Payer: Self-pay | Admitting: Acute Care

## 2024-03-21 ENCOUNTER — Ambulatory Visit: Admitting: Acute Care

## 2024-03-21 VITALS — BP 151/82 | HR 55 | Temp 97.9°F | Ht 68.0 in | Wt 249.6 lb

## 2024-03-21 DIAGNOSIS — R9389 Abnormal findings on diagnostic imaging of other specified body structures: Secondary | ICD-10-CM | POA: Diagnosis not present

## 2024-03-21 DIAGNOSIS — R911 Solitary pulmonary nodule: Secondary | ICD-10-CM

## 2024-03-21 DIAGNOSIS — Z87891 Personal history of nicotine dependence: Secondary | ICD-10-CM

## 2024-03-21 NOTE — Progress Notes (Signed)
 History of Present Illness Veronica Solomon is a 68 y.o. female former smoker followed through the lung cancer screening program. She was referred for lung nodule consult for an abnormal lung cancer screening scan.    03/21/2024 Discussed the use of AI scribe software for clinical note transcription with the patient, who gave verbal consent to proceed.  History of Present Illness Pt. Presents for follow up after PET scan done to further evaluate an abnormal finding on LDCT.   [facility] is a 68 year old female who presents for follow-up of a lung nodule.  In February 2024, she had a normal lung cancer screening scan. However, by February 2025, a 1.6 by 2 cm central right lower lobe thickening was identified, leading to a six-month follow-up scan instead of the usual annual scan.  The follow-up scan revealed that the atypical cyst had grown to 1.4 by 1.8 cm, with an increasing ground glass component. A PET scan showed low uptake with an SUV of 1.8. No weight loss or hemoptysis is reported.  Her family history is significant for lung cancer in her father, who was a smoker. She is a former smoker herself. 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 She reports no occupational exposures that could have contributed to her condition.  We discussed biopsy now to better evaluate this finding, vs a 3 month follow up scan to monitor for growth. Patient opted for the 3 month follow up scan. This will be due 06/2024. She will follow up with me 1-2 weeks after the scan to review the results.If there is any additional growth, we will biopsy at that time.      Test Results: PET scan 03/12/2024 No hypermetabolic lymph nodes. Persistent atypical pulmonary cyst with eccentric ground-glass wall thickening along its lateral aspect is again seen in the central right lower lobe. This measures 18 mm on image 46/7. Mild FDG activity is seen corresponding to this area  of eccentric wall thickening, with SUV max of 1.8. No other suspicious pulmonary nodules identified on CT images.  Mild FDG activity associated with eccentric wall thickening of atypical pulmonary cyst in the central right lower lobe. Low-grade adenocarcinoma cannot be excluded.   No evidence of metastatic disease.   LDCT 02/22/2024 Centrilobular and paraseptal emphysema. Atypical pulmonary cyst with eccentric ground-glass wall thickening in the central right lower lobe measures 1.4 x 1.8 cm. The ground-glass component has slowly increased in size over time, now measuring approximately 16.3 mm, previously 8.4 mm on 03/03/2018. Additional pulmonary nodules measure 5.3 mm or less in size. No pleural fluid. Airway is unremarkable.  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 Lung-RADS 4B, suspicious. Additional imaging evaluation or consultation with Pulmonology or Thoracic Surgery recommended. Aortic atherosclerosis (ICD10-I70.0). Coronary artery calcification. 3.  Emphysema (ICD10-J43.9).    Latest Ref Rng & Units 08/13/2022    2:03 PM 11/12/2021    2:17 PM 08/03/2021    2:07 PM  CBC  WBC 3.8 - 10.8 Thousand/uL 9.2  9.3  10.2   Hemoglobin 11.7 - 15.5 g/dL 86.4  86.8  86.9   Hematocrit 35.0 - 45.0 % 41.2  39.7  39.4   Platelets 140 - 400 Thousand/uL 271  268.0  268.0        Latest Ref Rng & Units 08/13/2022    2:03 PM 05/17/2022    1:28  PM 01/11/2022    1:38 PM  BMP  Glucose 65 - 99 mg/dL 82  98  99   BUN 7 - 25 mg/dL 20  21  16    Creatinine 0.50 - 1.05 mg/dL 9.19  9.12  9.25   BUN/Creat Ratio 6 - 22 (calc) SEE NOTE:     Sodium 135 - 146 mmol/L 140  140  141   Potassium 3.5 - 5.3 mmol/L 4.6  4.9  4.5   Chloride 98 - 110 mmol/L 103  103  104   CO2 20 - 32 mmol/L 28  29  29    Calcium  8.6 - 10.4 mg/dL 89.9  9.4  9.8     BNP No results found for:  BNP  ProBNP    Component Value Date/Time   PROBNP 14.0 05/11/2021 1530    PFT    Component Value Date/Time   FEV1PRE 2.05 07/29/2021 1300   FEV1POST 2.10 07/29/2021 1300   FVCPRE 2.70 07/29/2021 1300   FVCPOST 2.73 07/29/2021 1300   TLC 5.37 07/29/2021 1300   DLCOUNC 19.76 07/29/2021 1300   PREFEV1FVCRT 76 07/29/2021 1300   PSTFEV1FVCRT 77 07/29/2021 1300    NM PET Image Initial (PI) Skull Base To Thigh Result Date: 03/13/2024 CLINICAL DATA:  Initial treatment strategy for right lung nodule. EXAM: NUCLEAR MEDICINE PET SKULL BASE TO THIGH TECHNIQUE: 12.2 mCi F-18 FDG was injected intravenously. Full-ring PET imaging was performed from the skull base to thigh after the radiotracer. CT data was obtained and used for attenuation correction and anatomic localization. Fasting blood glucose: 105 mg/dl COMPARISON:  Chest CT on 02/22/2024 FINDINGS: Mediastinal blood-pool activity (background): SUV max = 4.1 Liver activity (reference): SUV max = N/A NECK:  No hypermetabolic lymph nodes or masses. Incidental CT findings:  None. CHEST: No hypermetabolic lymph nodes. Persistent atypical pulmonary cyst with eccentric ground-glass wall thickening along its lateral aspect is again seen in the central right lower lobe. This measures 18 mm on image 46/7. Mild FDG activity is seen corresponding to this area of eccentric wall thickening, with SUV max of 1.8. No other suspicious pulmonary nodules identified on CT images. Incidental CT findings:  Mild emphysema. ABDOMEN/PELVIS: No abnormal hypermetabolic activity within the liver, pancreas, adrenal glands, or spleen. No hypermetabolic lymph nodes in the abdomen or pelvis. Incidental CT findings: Diverticulosis of descending and sigmoid colon, without signs of diverticulitis. Prior hysterectomy. SKELETON: No focal hypermetabolic bone lesions to suggest skeletal metastasis. Incidental CT findings:  None. IMPRESSION: Mild FDG activity associated with eccentric wall  thickening of atypical pulmonary cyst in the central right lower lobe. Low-grade adenocarcinoma cannot be excluded. No evidence of metastatic disease. Electronically Signed   By: Norleen DELENA Kil M.D.   On: 03/13/2024 10:42   CT CHEST LCS NODULE F/U LOW DOSE WO CONTRAST Result Date: 03/04/2024 CLINICAL DATA:  Abnormal lung cancer screening CT. Forty-two pack-year former smoker, quit 2013. EXAM: CT CHEST WITHOUT CONTRAST FOR LUNG CANCER SCREENING NODULE FOLLOW-UP TECHNIQUE: Multidetector CT imaging of the chest was performed following the standard protocol without IV contrast. RADIATION DOSE REDUCTION: This exam was performed according to the departmental dose-optimization program which includes automated exposure control, adjustment of the mA and/or kV according to patient size and/or use of iterative reconstruction technique. COMPARISON:  Low-dose lung cancer screening CT exams dating back to 03/03/2018. FINDINGS: Cardiovascular: Atherosclerotic calcification of the aorta, aortic valve and coronary arteries. Heart is at the upper limits of normal in size to mildly enlarged. No pericardial effusion. Mediastinum/Nodes: No  pathologically enlarged mediastinal or axillary lymph nodes. Hilar regions are difficult to definitively evaluate without IV contrast. Esophagus is grossly unremarkable. Lungs/Pleura: Centrilobular and paraseptal emphysema. Atypical pulmonary cyst with eccentric ground-glass wall thickening in the central right lower lobe measures 1.4 x 1.8 cm. The ground-glass component has slowly increased in size over time, now measuring approximately 16.3 mm, previously 8.4 mm on 03/03/2018. Additional pulmonary nodules measure 5.3 mm or less in size. No pleural fluid. Airway is unremarkable. Upper Abdomen: Visualized portions of the liver, adrenal glands, spleen, pancreas, stomach and bowel are grossly unremarkable. No upper abdominal adenopathy. Musculoskeletal: Degenerative changes in the spine. IMPRESSION: 1.  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 Lung-RADS 4B, suspicious. Additional imaging evaluation or consultation with Pulmonology or Thoracic Surgery recommended. These results will be called to the ordering clinician or representative by the Radiologist Assistant, and communication documented in the PACS or [employer]. 2. Aortic atherosclerosis (ICD10-I70.0). Coronary artery calcification. 3.  Emphysema (ICD10-J43.9). Electronically Signed   By: Newell Eke M.D.   On: 03/04/2024 14:25     Past medical hx Past Medical History:  Diagnosis Date   Adenomatous colon polyp    Allergy    C. difficile colitis    Cataract 2020   COPD (chronic obstructive pulmonary disease) (HCC) 2020   Depression 2010   Diverticulosis    Emphysema of lung (HCC) 2022   GERD (gastroesophageal reflux disease) 2020   Heart murmur 2022   Hyperlipidemia    Hypertension    IBS (irritable bowel syndrome)    Nasal congestion 06/14/1998   Neuromuscular disorder (HCC)    Oxygen  deficiency 2020   Sleep apnea 2020   Substance abuse (HCC) 1974   Ulcer 1964     Social History   Tobacco Use   Smoking status: Former    Current packs/day: 0.00    Average packs/day: 1 pack/day for 42.0 years (42.0 ttl pk-yrs)    Types: Cigarettes    Start date: 12/28/1969    Quit date: 12/29/2011    Years since quitting: 12.2   Smokeless tobacco: Former   Tobacco comments:    Quit 12/2011  Vaping Use   Vaping status: Never Used  Substance Use Topics   Alcohol use: Yes    Comment: rarely   Drug use: No    Ms.Leece reports that she quit smoking about 12 years ago. Her smoking use included cigarettes. She started smoking about 54 years ago. She has a 42 pack-year smoking history. She has quit using smokeless tobacco. She reports current alcohol use. She reports that she does not use  drugs.  Tobacco Cessation: Counseling given: Not Answered Tobacco comments: Quit 12/2011 42 pack year smoking history  Past surgical hx, Family hx, Social hx all reviewed.  Current Outpatient Medications on File Prior to Visit  Medication Sig   albuterol  (VENTOLIN  HFA) 108 (90 Base) MCG/ACT inhaler Inhale 2 puffs into the lungs every 6 (six) hours as needed for wheezing or shortness of breath.   atorvastatin  (LIPITOR) 20 MG tablet TAKE 1 TABLET(20 MG) BY MOUTH DAILY   celecoxib  (CELEBREX ) 200 MG capsule Take 1 capsule (200 mg total) by mouth 2 (two) times daily as needed for moderate pain.   DULoxetine  (CYMBALTA ) 60 MG capsule TAKE 2 CAPSULES(120 MG) BY MOUTH DAILY   HYDROcodone -acetaminophen  (NORCO) 10-325 MG tablet Take 1 tablet by mouth every 8 (eight) hours as needed.  meclizine  (ANTIVERT ) 50 MG tablet Take 1 tablet (50 mg total) by mouth 3 (three) times daily as needed.   metoprolol  tartrate (LOPRESSOR ) 25 MG tablet TAKE 1 TABLET(25 MG) BY MOUTH TWICE DAILY   pregabalin  (LYRICA ) 75 MG capsule 1 po bid   traMADol  (ULTRAM ) 50 MG tablet TAKE 1 TABLET(50 MG) BY MOUTH EVERY 6 HOURS AS NEEDED   TRELEGY ELLIPTA  200-62.5-25 MCG/ACT AEPB Inhale 1 puff into the lungs daily.   Vibegron  (GEMTESA ) 75 MG TABS 1 po every day   No current facility-administered medications on file prior to visit.     Allergies  Allergen Reactions   Paregoric Hives    REACTION: HIVES   Penicillins Rash    REACTION: RASH   Procaine Hcl Other (See Comments)    Review Of Systems:  Constitutional:   No  weight loss, night sweats,  Fevers, chills, fatigue, or  lassitude.  HEENT:   No headaches,  Difficulty swallowing,  Tooth/dental problems, or  Sore throat,                No sneezing, itching, ear ache, nasal congestion, post nasal drip,   CV:  No chest pain,  Orthopnea, PND, swelling in lower extremities, anasarca, dizziness, palpitations, syncope.   GI  No heartburn, indigestion, abdominal pain, nausea,  vomiting, diarrhea, change in bowel habits, loss of appetite, bloody stools.   Resp: No shortness of breath with exertion or at rest.  No excess mucus, no productive cough,  No non-productive cough,  No coughing up of blood.  No change in color of mucus.  No wheezing.  No chest wall deformity  Skin: no rash or lesions.  GU: no dysuria, change in color of urine, no urgency or frequency.  No flank pain, no hematuria   MS:  No joint pain or swelling.  No decreased range of motion.  No back pain.  Psych:  No change in mood or affect. No depression or anxiety.  No memory loss.   Vital Signs BP (!) 151/82   Pulse (!) 55   Temp 97.9 F (36.6 C) (Oral)   Ht 5' 8 (1.727 m)   Wt 249 lb 9.6 oz (113.2 kg)   SpO2 95%   BMI 37.95 kg/m    Physical Exam:  General- No distress,  A&Ox3, pleasant ENT: No sinus tenderness, TM clear, pale nasal mucosa, no oral exudate,no post nasal drip, no LAN Cardiac: S1, S2, regular rate and rhythm, no murmur Chest: No wheeze/ rales/ dullness; no accessory muscle use, no nasal flaring, no sternal retractions Abd.: Soft Non-tender, ND, BS +, Body mass index is 37.95 kg/m.  Ext: No clubbing cyanosis, edema, no obvious deformities Neuro:  normal strength, MAE x 4, A&O x 3, appropriate Skin: No rashes, warm and dry, no obvious skin lesions  Psych: normal mood and behavior   Assessment/Plan   Assessment & Plan Abnormal Lung Cancer Screening Right lower lobe lung nodule with enlarging ground glass component Nodule size increased with enlarging ground glass component. Low SUV uptake on PET scan suggests low-grade adenocarcinoma cannot be excluded.  No metastatic disease.  Asymptomatic.  Family history of lung cancer noted. Former smoker  Plan - Schedule three-month follow-up CT scan. - Plan follow-up appointment 1-2 weeks post-scan to discuss results. - Consider biopsy if nodule grows or solid component increases. Discussed biopsy risks: bleeding,  infection, pneumothorax (5% risk). Biopsy under general anesthesia by Dr. Ruther with fiber optic scope.  Tobacco use Former smoker, risk factor for  lung cancer.  I spent 20 minutes dedicated to the care of this patient on the date of this encounter to include pre-visit review of records, face-to-face time with the patient discussing conditions above, post visit ordering of testing, clinical documentation with the electronic health record, making appropriate referrals as documented, and communicating necessary information to the patient's healthcare team.      Lauraine JULIANNA Lites, NP 03/21/2024  11:10 AM

## 2024-03-21 NOTE — Patient Instructions (Signed)
 It is good to see you today. We have reviewed the results of your PET scan. They show that the right lower lobe nodule we are following has an SUV, or uptake of 1.8. Low grade adenocarcinoma cannot be excluded. We discussed biopsy now, vs a 3 month follow up Ct Chest to check for stability of the lung nodule.  You prefer a 3 month follow up Ct Chest. This will be due 06/2024. You will get a call to get this scheduled closer to the time it is due. You will follow up with me 1-2 weeks after the scan is done to review the results. Call to be seen sooner for any unexplained weight loss or blood in your sputum when you cough. Call if you need us  sooner. Please contact office for sooner follow up if symptoms do not improve or worsen or seek emergency care

## 2024-03-22 ENCOUNTER — Other Ambulatory Visit: Payer: Self-pay

## 2024-03-23 MED ORDER — METOPROLOL TARTRATE 25 MG PO TABS: 25.0000 mg | ORAL_TABLET | Freq: Two times a day (BID) | ORAL | 0 refills | Status: DC

## 2024-05-03 ENCOUNTER — Other Ambulatory Visit: Payer: Self-pay | Admitting: Family Medicine

## 2024-05-03 DIAGNOSIS — G894 Chronic pain syndrome: Secondary | ICD-10-CM

## 2024-05-03 NOTE — Telephone Encounter (Signed)
 Requesting: Lyrica  75mg  Contract:11/10/23 UDS: 11/10/23 Last Visit:11/10/23 Next Visit: None Last Refill: 11/10/23 #180 and 2RF   Please Advise

## 2024-05-04 ENCOUNTER — Other Ambulatory Visit: Payer: Self-pay

## 2024-05-04 ENCOUNTER — Other Ambulatory Visit: Payer: Self-pay | Admitting: Family Medicine

## 2024-05-04 DIAGNOSIS — G894 Chronic pain syndrome: Secondary | ICD-10-CM

## 2024-05-04 NOTE — Progress Notes (Signed)
 Error, order was not placed

## 2024-05-05 ENCOUNTER — Other Ambulatory Visit: Payer: Self-pay | Admitting: Cardiovascular Disease

## 2024-05-08 ENCOUNTER — Encounter: Payer: Self-pay | Admitting: Family Medicine

## 2024-05-14 ENCOUNTER — Encounter: Payer: Self-pay | Admitting: Family Medicine

## 2024-05-14 ENCOUNTER — Ambulatory Visit: Admitting: Family Medicine

## 2024-05-14 VITALS — BP 140/80 | HR 61 | Temp 98.1°F | Resp 16 | Ht 68.0 in | Wt 254.2 lb

## 2024-05-14 DIAGNOSIS — F321 Major depressive disorder, single episode, moderate: Secondary | ICD-10-CM | POA: Diagnosis not present

## 2024-05-14 DIAGNOSIS — N3941 Urge incontinence: Secondary | ICD-10-CM

## 2024-05-14 DIAGNOSIS — R490 Dysphonia: Secondary | ICD-10-CM | POA: Diagnosis not present

## 2024-05-14 DIAGNOSIS — I421 Obstructive hypertrophic cardiomyopathy: Secondary | ICD-10-CM

## 2024-05-14 NOTE — Progress Notes (Unsigned)
 Subjective:    Patient ID: Veronica Solomon, female    DOB: 1956-05-19, 68 y.o.   MRN: [id number]  Chief Complaint  Patient presents with   Hyperlipidemia   Pain Management   Follow-up    HPI Patient is in today for f/u chol and pain management.  Discussed the use of AI scribe software for clinical note transcription with the patient, who gave verbal consent to proceed.  History of Present Illness Veronica Solomon is a 68 year old female who presents for follow-up on her cholesterol and urination issues.  She experiences ongoing issues with urination, particularly nocturia, despite taking Gemtesa . The urination problem is characterized by urge incontinence, where she feels a strong urge to urinate and must reach the bathroom quickly. There are no symptoms of stress incontinence such as leakage with coughing, laughing, or sneezing.  She has a history of COVID-19 about a year and a half ago, after which she experienced a persistent change in her voice. She has not consulted an ENT specialist for this issue.  She is scheduled for a follow-up CT scan of her lungs next month due to a previously identified nodule that showed growth over a six-month period. A PET scan was performed, but the results were inconclusive. She is concerned about the possibility of cancer.  Her depression is stable, and she finds that staying busy helps manage her symptoms. 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    Past Medical History:  Diagnosis Date   Adenomatous colon polyp    Allergy    C. difficile colitis    Cataract 2020   COPD (chronic obstructive pulmonary disease) (HCC) 2020   Depression 2010   Diverticulosis    Emphysema of lung (HCC) 2022   GERD (gastroesophageal reflux disease) 2020   Heart murmur 2022   Hyperlipidemia    Hypertension    IBS (irritable bowel syndrome)    Nasal congestion 06/14/1998   Neuromuscular disorder  (HCC)    Oxygen  deficiency 2020   Sleep apnea 2020   Substance abuse (HCC) 1974   Ulcer 1964    Past Surgical History:  Procedure Laterality Date   ABDOMINAL HYSTERECTOMY  06/14/2000   fibroids   EYE SURGERY  2020   PLANTAR FASCIECTOMY     TUBAL LIGATION  1990    Family History  Problem Relation Age of Onset   Hypertension Mother    Depression Mother    Heart attack Mother    Uterine cancer Mother    Diabetes Maternal Grandmother    Hypertension Paternal Grandmother     Social History   Socioeconomic History   Marital status: Single    Spouse name: Not on file   Number of children: Not on file   Years of education: Not on file   Highest education level: Not on file  Occupational History   Occupation: self employed -- archivist  Tobacco Use   Smoking status: Former    Current packs/day: 0.00    Average packs/day: 1 pack/day for 42.0 years (42.0 ttl pk-yrs)    Types: Cigarettes    Start date: 12/28/1969    Quit date: 12/29/2011    Years since quitting: 12.4   Smokeless tobacco: Former   Tobacco comments:    Quit 12/2011  Vaping Use   Vaping status: Never Used  Substance and Sexual Activity   Alcohol use: Yes    Comment: rarely   Drug use: No  Sexual activity: Not Currently  Other Topics Concern   Not on file  Social History Narrative   Exercise -- training dogs    Social Drivers of Health   Financial Resource Strain: Low Risk  (09/15/2023)   Overall Financial Resource Strain (CARDIA)    Difficulty of Paying Living Expenses: Not hard at all  Food Insecurity: No Food Insecurity (09/15/2023)   Hunger Vital Sign    Worried About Running Out of Food in the Last Year: Never true    Ran Out of Food in the Last Year: Never true  Transportation Needs: No Transportation Needs (09/15/2023)   PRAPARE - Administrator, Civil Service (Medical): No    Lack of Transportation (Non-Medical): No  Physical Activity: Insufficiently Active (09/15/2023)   Exercise  Vital Sign    Days of Exercise per Week: 4 days    Minutes of Exercise per Session: 30 min  Stress: No Stress Concern Present (09/15/2023)   [employer] of Occupational Health - Occupational Stress Questionnaire    Feeling of Stress : Not at all  Social Connections: Socially Isolated (09/15/2023)   Social Connection and Isolation Panel    Frequency of Communication with Friends and Family: More than three times a week    Frequency of Social Gatherings with Friends and Family: More than three times a week    Attends Religious Services: Never    Database Administrator or Organizations: No    Attends Banker Meetings: Never    Marital Status: Divorced  Catering Manager Violence: Not At Risk (09/15/2023)   Humiliation, Afraid, Rape, and Kick questionnaire    Fear of Current or Ex-Partner: No    Emotionally Abused: No    Physically Abused: No    Sexually Abused: No    Outpatient Medications Prior to Visit  Medication Sig Dispense Refill   albuterol  (VENTOLIN  HFA) 108 (90 Base) MCG/ACT inhaler Inhale 2 puffs into the lungs every 6 (six) hours as needed for wheezing or shortness of breath. 18 g 5   atorvastatin  (LIPITOR) 20 MG tablet TAKE 1 TABLET(20 MG) BY MOUTH DAILY 90 tablet 1   celecoxib  (CELEBREX ) 200 MG capsule Take 1 capsule (200 mg total) by mouth 2 (two) times daily as needed for moderate pain. 180 capsule 3   DULoxetine  (CYMBALTA ) 60 MG capsule TAKE 2 CAPSULES(120 MG) BY MOUTH DAILY 180 capsule 0   HYDROcodone -acetaminophen  (NORCO) 10-325 MG tablet Take 1 tablet by mouth every 8 (eight) hours as needed. 30 tablet 0   meclizine  (ANTIVERT ) 50 MG tablet Take 1 tablet (50 mg total) by mouth 3 (three) times daily as needed. 30 tablet 0   metoprolol  tartrate (LOPRESSOR ) 25 MG tablet Take 1 tablet (25 mg total) by mouth 2 (two) times daily. 30 tablet 0   pregabalin  (LYRICA ) 75 MG capsule TAKE 1 CAPSULE BY MOUTH TWICE DAILY 180 capsule 1   traMADol  (ULTRAM ) 50 MG tablet TAKE  1 TABLET(50 MG) BY MOUTH EVERY 6 HOURS AS NEEDED 90 tablet 0   TRELEGY ELLIPTA  200-62.5-25 MCG/ACT AEPB Inhale 1 puff into the lungs daily. 60 each 3   Vibegron  (GEMTESA ) 75 MG TABS 1 po every day 30 tablet 11   No facility-administered medications prior to visit.    Allergies  Allergen Reactions   Paregoric Hives    REACTION: HIVES   Penicillins Rash    REACTION: RASH   Procaine Hcl Other (See Comments)    Review of Systems  Constitutional:  Negative  for fever and malaise/fatigue.  HENT:  Negative for congestion.   Eyes:  Negative for blurred vision.  Respiratory:  Negative for shortness of breath.   Cardiovascular:  Negative for chest pain, palpitations and leg swelling.  Gastrointestinal:  Negative for abdominal pain, blood in stool and nausea.  Genitourinary:  Negative for dysuria and frequency.  Musculoskeletal:  Negative for falls.  Skin:  Negative for rash.  Neurological:  Negative for dizziness, loss of consciousness and headaches.  Endo/Heme/Allergies:  Negative for environmental allergies.  Psychiatric/Behavioral:  Negative for depression. The patient is not nervous/anxious.        Objective:    Physical Exam Vitals and nursing note reviewed.  Constitutional:      General: She is not in acute distress.    Appearance: Normal appearance. She is well-developed.  HENT:     Head: Normocephalic and atraumatic.  Eyes:     General: No scleral icterus.       Right eye: No discharge.        Left eye: No discharge.  Cardiovascular:     Rate and Rhythm: Normal rate and regular rhythm.     Heart sounds: No murmur heard. Pulmonary:     Effort: Pulmonary effort is normal. No respiratory distress.     Breath sounds: Normal breath sounds.  Musculoskeletal:        General: Normal range of motion.     Cervical back: Normal range of motion and neck supple.     Right lower leg: No edema.     Left lower leg: No edema.  Skin:    General: Skin is warm and dry.   Neurological:     Mental Status: She is alert and oriented to person, place, and time.  Psychiatric:        Mood and Affect: Mood normal.        Behavior: Behavior normal.        Thought Content: Thought content normal.        Judgment: Judgment normal.     BP (!) 140/80 (BP Location: Right Arm, Patient Position: Sitting, Cuff Size: Large)   Pulse 61   Temp 98.1 F (36.7 C) (Oral)   Resp 16   Ht 5' 8 (1.727 m)   Wt 254 lb 3.2 oz (115.3 kg)   SpO2 94%   BMI 38.65 kg/m  Wt Readings from Last 3 Encounters:  05/14/24 254 lb 3.2 oz (115.3 kg)  03/21/24 249 lb 9.6 oz (113.2 kg)  11/10/23 247 lb (112 kg)    Diabetic Foot Exam - Simple   No data filed    Lab Results  Component Value Date   WBC 8.5 05/14/2024   HGB 14.3 05/14/2024   HCT 42.8 05/14/2024   PLT 235.0 05/14/2024   GLUCOSE 92 05/14/2024   CHOL 186 05/14/2024   TRIG 238.0 (H) 05/14/2024   HDL 62.10 05/14/2024   LDLDIRECT 195.7 01/12/2010   LDLCALC 77 05/14/2024   ALT 26 05/14/2024   AST 25 05/14/2024   NA 140 05/14/2024   K 4.7 05/14/2024   CL 103 05/14/2024   CREATININE 0.90 05/14/2024   BUN 24 (H) 05/14/2024   CO2 31 05/14/2024   TSH 1.56 05/14/2024   HGBA1C 5.8 05/14/2024    Lab Results  Component Value Date   TSH 1.56 05/14/2024   Lab Results  Component Value Date   WBC 8.5 05/14/2024   HGB 14.3 05/14/2024   HCT 42.8 05/14/2024   MCV 88.2 05/14/2024  PLT 235.0 05/14/2024   Lab Results  Component Value Date   NA 140 05/14/2024   K 4.7 05/14/2024   CO2 31 05/14/2024   GLUCOSE 92 05/14/2024   BUN 24 (H) 05/14/2024   CREATININE 0.90 05/14/2024   BILITOT 0.7 05/14/2024   ALKPHOS 110 05/14/2024   AST 25 05/14/2024   ALT 26 05/14/2024   PROT 6.9 05/14/2024   ALBUMIN 4.6 05/14/2024   CALCIUM  9.8 05/14/2024   GFR 66.07 05/14/2024   Lab Results  Component Value Date   CHOL 186 05/14/2024   Lab Results  Component Value Date   HDL 62.10 05/14/2024   Lab Results  Component Value  Date   LDLCALC 77 05/14/2024   Lab Results  Component Value Date   TRIG 238.0 (H) 05/14/2024   Lab Results  Component Value Date   CHOLHDL 3 05/14/2024   Lab Results  Component Value Date   HGBA1C 5.8 05/14/2024       Assessment & Plan:  Urge incontinence of urine -     Ambulatory referral to Urogynecology  Hoarseness -     Ambulatory referral to ENT  Moderate major depression (HCC)  HOCM (hypertrophic obstructive cardiomyopathy) (HCC) Assessment & Plan: Per cardiology    Assessment and Plan Assessment & Plan Urge incontinence   She experiences persistent urge incontinence with nocturnal symptoms and daytime issues, but no stress incontinence. Currently on Gemteza. Referred to a urogynecologist for further evaluation and management options, including potential Botox or stimulator therapy.  Dysphonia   She has had persistent dysphonia since her last COVID infection 1.5 years ago, with no ENT evaluation yet. Referred to ENT for evaluation.  Depression   Her depression is well-managed with no significant changes. Symptoms improve with activity and engagement. Continue current management and encourage staying active.   Veronica Zhang R Lowne Chase, DO

## 2024-05-15 LAB — CBC WITH DIFFERENTIAL/PLATELET
Basophils Absolute: 0.1 K/uL (ref 0.0–0.1)
Basophils Relative: 1.1 % (ref 0.0–3.0)
Eosinophils Absolute: 0.4 K/uL (ref 0.0–0.7)
Eosinophils Relative: 5 % (ref 0.0–5.0)
HCT: 42.8 % (ref 36.0–46.0)
Hemoglobin: 14.3 g/dL (ref 12.0–15.0)
Lymphocytes Relative: 27.1 % (ref 12.0–46.0)
Lymphs Abs: 2.3 K/uL (ref 0.7–4.0)
MCHC: 33.3 g/dL (ref 30.0–36.0)
MCV: 88.2 fl (ref 78.0–100.0)
Monocytes Absolute: 0.6 K/uL (ref 0.1–1.0)
Monocytes Relative: 7.6 % (ref 3.0–12.0)
Neutro Abs: 5 K/uL (ref 1.4–7.7)
Neutrophils Relative %: 59.2 % (ref 43.0–77.0)
Platelets: 235 K/uL (ref 150.0–400.0)
RBC: 4.86 Mil/uL (ref 3.87–5.11)
RDW: 13.6 % (ref 11.5–15.5)
WBC: 8.5 K/uL (ref 4.0–10.5)

## 2024-05-15 LAB — HEMOGLOBIN A1C: Hgb A1c MFr Bld: 5.8 % (ref 4.6–6.5)

## 2024-05-15 LAB — COMPREHENSIVE METABOLIC PANEL WITH GFR
ALT: 26 U/L (ref 0–35)
AST: 25 U/L (ref 0–37)
Albumin: 4.6 g/dL (ref 3.5–5.2)
Alkaline Phosphatase: 110 U/L (ref 39–117)
BUN: 24 mg/dL — ABNORMAL HIGH (ref 6–23)
CO2: 31 meq/L (ref 19–32)
Calcium: 9.8 mg/dL (ref 8.4–10.5)
Chloride: 103 meq/L (ref 96–112)
Creatinine, Ser: 0.9 mg/dL (ref 0.40–1.20)
GFR: 66.07 mL/min (ref >60.00)
Glucose, Bld: 92 mg/dL (ref 70–99)
Potassium: 4.7 meq/L (ref 3.5–5.1)
Sodium: 140 meq/L (ref 135–145)
Total Bilirubin: 0.7 mg/dL (ref 0.2–1.2)
Total Protein: 6.9 g/dL (ref 6.0–8.3)

## 2024-05-15 LAB — LIPID PANEL
Cholesterol: 186 mg/dL (ref 0–200)
HDL: 62.1 mg/dL (ref >39.00)
LDL Cholesterol: 77 mg/dL (ref 0–99)
NonHDL: 124.14
Total CHOL/HDL Ratio: 3
Triglycerides: 238 mg/dL — ABNORMAL HIGH (ref 0.0–149.0)
VLDL: 47.6 mg/dL — ABNORMAL HIGH (ref 0.0–40.0)

## 2024-05-15 LAB — VITAMIN B12: Vitamin B-12: 183 pg/mL — ABNORMAL LOW (ref 211–911)

## 2024-05-15 LAB — TSH: TSH: 1.56 u[IU]/mL (ref 0.35–5.50)

## 2024-05-15 LAB — VITAMIN D 25 HYDROXY (VIT D DEFICIENCY, FRACTURES): VITD: 21.48 ng/mL — ABNORMAL LOW (ref 30.00–100.00)

## 2024-05-21 ENCOUNTER — Ambulatory Visit: Payer: Self-pay | Admitting: Family Medicine

## 2024-05-21 NOTE — Assessment & Plan Note (Signed)
 Per cardiology

## 2024-05-23 ENCOUNTER — Telehealth: Payer: Self-pay

## 2024-05-23 NOTE — Telephone Encounter (Signed)
 Spoke w/ Pt- nurse visit scheduled.

## 2024-05-23 NOTE — Telephone Encounter (Signed)
 Copied from CRM #[id number]. Topic: Clinical - Request for Lab/Test Order >> May 23, 2024  3:07 PM [name] ORN wrote: Reason for CRM: Patient is calling for another order for a B12 shot.

## 2024-05-24 ENCOUNTER — Telehealth: Payer: Self-pay | Admitting: Cardiovascular Disease

## 2024-05-24 NOTE — Telephone Encounter (Signed)
 Returned call to pt.  Left a message that 15 days supply was sent in yesterday, 05/23/24, and she needed to contact the office for an appointment.

## 2024-05-24 NOTE — Telephone Encounter (Signed)
°*  STAT* If patient is at the pharmacy, call can be transferred to refill team.   1. Which medications need to be refilled? (please list name of each medication and dose if known)   metoprolol  tartrate (LOPRESSOR ) 25 MG tablet    2. Which pharmacy/location (including street and city if local pharmacy) is medication to be sent to?  [id number] - [place], [place] - [address]      3. Do they need a 30 day or 90 day supply? 90 day    Pt is out of medication

## 2024-05-24 NOTE — Telephone Encounter (Signed)
 Pt scheduled for b12 shots in office.

## 2024-05-25 ENCOUNTER — Ambulatory Visit

## 2024-05-25 ENCOUNTER — Ambulatory Visit: Attending: Cardiology | Admitting: Cardiology

## 2024-05-25 ENCOUNTER — Encounter: Payer: Self-pay | Admitting: Cardiology

## 2024-05-25 VITALS — BP 136/86 | HR 86 | Ht 68.0 in | Wt 248.8 lb

## 2024-05-25 DIAGNOSIS — I10 Essential (primary) hypertension: Secondary | ICD-10-CM | POA: Diagnosis not present

## 2024-05-25 DIAGNOSIS — I421 Obstructive hypertrophic cardiomyopathy: Secondary | ICD-10-CM

## 2024-05-25 MED ORDER — METOPROLOL TARTRATE 25 MG PO TABS: 25.0000 mg | ORAL_TABLET | Freq: Two times a day (BID) | ORAL | 1 refills | Status: DC

## 2024-05-25 NOTE — Patient Instructions (Signed)
 Thank you for choosing [facility]!     Medication Instructions:  No medication changes were made during today's visit.  *If you need a refill on your cardiac medications before your next appointment, please call your pharmacy*   Lab Work: No labs were ordered during today's visit.  If you have labs (blood work) drawn today and your tests are completely normal, you will receive your results only by: MyChart Message (if you have MyChart) OR A paper copy in the mail If you have any lab test that is abnormal or we need to change your treatment, we will call you to review the results.   Testing/Procedures: Your physician has requested that you have an echocardiogram. Echocardiography is a painless test that uses sound waves to create images of your heart. It provides your doctor with information about the size and shape of your heart and how well your hearts chambers and valves are working. This procedure takes approximately one hour. There are no restrictions for this procedure.   Please do NOT wear cologne, perfume, aftershave, or lotions (deodorant is allowed).   Please arrive 15 minutes prior to your appointment time.   Your next appointment:   6 month(s)   Provider:   Katlyn West, NP           Follow-Up: At [facility], you and your health needs are our priority.  As part of our continuing mission to provide you with exceptional heart care, we have created designated Provider Care Teams.  These Care Teams include your primary Cardiologist (physician) and Advanced Practice Providers (APPs -  Physician Assistants and Nurse Practitioners) who all work together to provide you with the care you need, when you need it. We recommend signing up for the patient portal called MyChart.  Sign up information is provided on this After Visit Summary.  MyChart is used to connect with patients for Virtual Visits (Telemedicine).  Patients are able to view lab/test results,  encounter notes, upcoming appointments, etc.  Non-urgent messages can be sent to your provider as well.   To learn more about what you can do with MyChart, go to [email or web address].

## 2024-05-25 NOTE — Progress Notes (Addendum)
 " Cardiology Office Note:  .   Date:  05/25/2024  ID:  [address] [name], [place] Jul 02, 1955, MRN [id number] PCP: Antonio Meth, Jamee SAUNDERS, DO  [facility] Providers Cardiologist:  Darryle ONEIDA Decent, MD {  History of Present Illness: .   [facility] Veronica Solomon is a 68 y.o. female with history of HOCM, hyperlipidemia, COPD     HOCM 12/2021 echocardiogram with EF 60 to 65% with moderate focal basal septal LVH.  LVOT gradient 80 with peak Valsalva.  Normal RV.  Mild MR.  Cannot rule out a PFO. 02/2022 cardiac MRI sigmoid subtype, low risk.  Moderate asymmetric LVH, 15 mm.  LVEF 58%.  No LGE. May 10, 2022 normal heart monitor  Social history  Former smoker for 45 years.  Quit over 12 years ago.  Occasionally drinks.  No drugs. 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  She has generally been asymptomatic with only shortness of breath with heavy activity but not passing out or with family history of sudden cardiac death.  Last seen 10-May-2022 and overall doing well.  She was sent for genetic testing.  Today patient presents for overdue follow-up.  She does not have any significant complaints today.  However when prompted she does report that she has chronic shortness of breath that has been essentially unchanged for a while now, reports having COPD and has some degree of SOB at all times.  Has no significant functional limitations, works as a armed forces operational officer and able to do all of her ADLs without difficulty.  No chest pain, episodes of syncope, peripheral edema orthopnea.  Feeling well overall.  ROS: Denies: Chest pain, shortness of breath, orthopnea, peripheral edema, palpitations, decreased exercise intolerance, fatigue, lightheadedness.   Studies Reviewed: SABRA    EKG Interpretation Date/Time:  Friday May 25 2024 15:17:20 EST Ventricular Rate:  78 PR Interval:  156 QRS Duration:  88 QT Interval:  352 QTC Calculation: 401 R  Axis:   6  Text Interpretation: Normal sinus rhythm Possible Left atrial enlargement Minimal voltage criteria for LVH, may be normal variant ( Cornell product ) Nonspecific ST abnormality When compared with ECG of 09-Mar-2020 00:42, PREVIOUS ECG IS PRESENT Confirmed by Darryle Currier [phone]) on 05/25/2024 3:21:33 PM    Risk Assessment/Calculations:             Physical Exam:   VS:  BP 136/86   Pulse 86   Ht 5' 8 (1.727 m)   Wt 248 lb 12.8 oz (112.9 kg)   SpO2 94%   BMI 37.83 kg/m    Wt Readings from Last 3 Encounters:  05/25/24 248 lb 12.8 oz (112.9 kg)  05/14/24 254 lb 3.2 oz (115.3 kg)  03/21/24 249 lb 9.6 oz (113.2 kg)    GEN: Well nourished, well developed in no acute distress NECK: No JVD; No carotid bruits CARDIAC: RRR, 3 out of 6 murmur RSB RESPIRATORY:  Clear to auscultation without rales, wheezing or rhonchi  ABDOMEN: Soft, non-tender, non-distended EXTREMITIES:  No edema; No deformity   ASSESSMENT AND PLAN: .    HOCM -12/2021 echocardiogram with EF 60 to 65% with moderate focal basal septal LVH.  LVOT gradient 80 with peak Valsalva.  Normal RV.  Mild MR.  Cannot rule out a PFO. -02/2022 cardiac MRI sigmoid subtype, low risk.  Moderate asymmetric LVH, 15 mm.  LVEF 58%.  No LGE. -05/10/22 normal heart monitor Overall seems to have  well compensated disease, low risk variant, and relatively asymptomatic. No syncope. She has concomitant COPD so therefore has baseline degree of shortness of breath.  On exam she does have significant murmur, and has not been seen in the last 2 years so not clear if this is representative of further increasing gradients but she is doing well and looks euvolemic today.  NYHA class I/II. Getting an echocardiogram, will get in touch with our echo team to figure out exactly how to order this to ensure we get the Valsalva gradients. Does not look like she ever completed genetic testing, will facilitate.  Children also have not been tested. Continue with  Lopressor  25 mg twice daily.  Hypertension Currently not on anything besides metoprolol .  Blood pressure on recheck was actually 150/80.  She reports generally good blood pressure but does not check this frequently.  Reported PCP had recently stopped one of her medications.  I asked her to log her blood pressure from next 1 week to provide us  measurements to make adjustments accordingly.  She will send them to us  via MyChart. Will consider verapamil.   HLD Continue atorvastatin  20 mg daily.       Dispo: 6 months with Dr. Barbaraann.  Signed, Thom LITTIE Sluder, PA-C  "

## 2024-05-29 NOTE — Addendum Note (Signed)
 Addended by: Autumnrose Yore on: 05/29/2024 11:50 AM   Modules accepted: Orders

## 2024-06-06 ENCOUNTER — Other Ambulatory Visit (HOSPITAL_BASED_OUTPATIENT_CLINIC_OR_DEPARTMENT_OTHER): Payer: Self-pay

## 2024-06-06 ENCOUNTER — Ambulatory Visit (INDEPENDENT_AMBULATORY_CARE_PROVIDER_SITE_OTHER)

## 2024-06-06 DIAGNOSIS — E538 Deficiency of other specified B group vitamins: Secondary | ICD-10-CM

## 2024-06-06 MED ORDER — CYANOCOBALAMIN 1000 MCG/ML IJ SOLN
1000.0000 ug | Freq: Once | INTRAMUSCULAR | Status: AC
  Administered 2024-06-06: 1000 ug via INTRAMUSCULAR

## 2024-06-06 MED ORDER — CYANOCOBALAMIN 1000 MCG/ML IJ SOLN
1000.0000 ug | INTRAMUSCULAR | 5 refills | Status: AC
  Filled 2024-06-06: qty 1, 30d supply, fill #0
  Filled 2024-07-07: qty 1, 30d supply, fill #1

## 2024-06-06 MED ORDER — CYANOCOBALAMIN 1000 MCG/ML IJ SOLN
1000.0000 ug | INTRAMUSCULAR | 2 refills | Status: AC
  Filled 2024-06-06: qty 1, 21d supply, fill #0
  Filled 2024-06-20: qty 1, fill #0
  Filled 2024-06-21: qty 3, 21d supply, fill #0

## 2024-06-06 NOTE — Progress Notes (Signed)
" °  Patient is here for weekly B12 injection per original order dated:  05/21/2024: Low b12----  may benefit from b12 weekly x4 then montly Vita d is low---  vitad 3 2000 u daily  Recheck labs at next ov - Per Dr. Antonio Meth  [facility] to have pt do SQ injections monthly at home  Per Dr. Antonio Meth - 05/23/2024  Last B12 injection: 11/13/2021 I just administered my last B-12 injection. Do I need a refill? -Per Patient, A.Gregory  Last B12 level:  B12 183: 05/14/2024  B12 1000mcg given Left Deltoid IM, and patient tolerated injection well.  Next B12 injection scheduled for: Not scheduled, patient will do next 3 WEEKS and MONTHLY B12 Injections at home. RX was sent in today to her pharmacy. "

## 2024-06-13 ENCOUNTER — Encounter: Payer: Self-pay | Admitting: Family Medicine

## 2024-06-19 ENCOUNTER — Ambulatory Visit (HOSPITAL_COMMUNITY)
Admission: RE | Admit: 2024-06-19 | Discharge: 2024-06-19 | Disposition: A | Discharge status: Home / Self Care | Payer: Self-pay | Source: Ambulatory Visit | Attending: Cardiology | Admitting: Cardiology

## 2024-06-19 DIAGNOSIS — I421 Obstructive hypertrophic cardiomyopathy: Secondary | ICD-10-CM | POA: Insufficient documentation

## 2024-06-19 LAB — ECHOCARDIOGRAM COMPLETE
Area-P 1/2: 3.17 cm2
S' Lateral: 2.8 cm

## 2024-06-20 ENCOUNTER — Ambulatory Visit: Payer: Self-pay | Admitting: Cardiology

## 2024-06-21 ENCOUNTER — Ambulatory Visit (HOSPITAL_BASED_OUTPATIENT_CLINIC_OR_DEPARTMENT_OTHER)

## 2024-06-21 ENCOUNTER — Ambulatory Visit (HOSPITAL_BASED_OUTPATIENT_CLINIC_OR_DEPARTMENT_OTHER)
Admission: RE | Admit: 2024-06-21 | Discharge: 2024-06-21 | Disposition: A | Discharge status: Home / Self Care | Source: Ambulatory Visit | Attending: Acute Care | Admitting: Acute Care

## 2024-06-21 ENCOUNTER — Other Ambulatory Visit (HOSPITAL_BASED_OUTPATIENT_CLINIC_OR_DEPARTMENT_OTHER): Payer: Self-pay

## 2024-06-21 DIAGNOSIS — R911 Solitary pulmonary nodule: Secondary | ICD-10-CM | POA: Insufficient documentation

## 2024-06-22 ENCOUNTER — Ambulatory Visit: Admitting: Internal Medicine

## 2024-06-22 ENCOUNTER — Ambulatory Visit: Attending: Genetic Counselor | Admitting: Genetic Counselor

## 2024-06-22 ENCOUNTER — Ambulatory Visit

## 2024-06-22 VITALS — BP 132/80 | HR 59 | Ht 68.0 in | Wt 254.8 lb

## 2024-06-22 DIAGNOSIS — R001 Bradycardia, unspecified: Secondary | ICD-10-CM

## 2024-06-22 DIAGNOSIS — I421 Obstructive hypertrophic cardiomyopathy: Secondary | ICD-10-CM

## 2024-06-22 DIAGNOSIS — M797 Fibromyalgia: Secondary | ICD-10-CM

## 2024-06-22 DIAGNOSIS — G4733 Obstructive sleep apnea (adult) (pediatric): Secondary | ICD-10-CM

## 2024-06-22 NOTE — Progress Notes (Signed)
 " Cardiology Office Note:  .    Date:  06/22/2024  ID:  [address] [place], DOB July 14, 1955, MRN [id number] PCP: Antonio Meth, Jamee SAUNDERS, DO  [facility] Providers Cardiologist:  Darryle ONEIDA Decent, MD     CC: HCM eval Consulted for the evaluation of DOE at the behest of Thom Sluder PA-C   History of Present Illness: .    [facility] [place] is a 69 y.o. female with hypertrophic cardiomyopathy and COPD who presents with shortness of breath.  She experiences shortness of breath, particularly during physical activities such as walking and climbing stairs. As a armed forces operational officer, she often walks dogs but limits these walks to 30 minutes or less due to her symptoms. She denies chest discomfort and syncope, but reports occasional vertigo and seeing 'stars' when standing up suddenly. Occasionally experiences vertigo.  She has hypertrophic cardiomyopathy diagnosed on November 20, 2021, with a maximal septal thickness of 15 millimeters and a late gadolinium enhancement burden of 5%. She has been told she has sinus bradycardia and is on a beta blocker. A CT scan showed mild coronary artery calcifications, and a heart monitor did not show evidence of VT or atrial fibrillation.  She has COPD, described as mild by her pulmonologist. She uses Trelegy and has an albuterol  inhaler, which she rarely uses. Despite these treatments, she continues to experience shortness of breath.  She has fibromyalgia, contributing to her chronic pain. 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  Her family history includes her mother having a stroke and a heart attack, but there is no known family history of hypertrophic cardiomyopathy or sudden cardiac death. She is unsure if her parents had the condition.  She has sleep apnea and uses a CPAP machine, which has improved her sleep quality. Her apnea hypopnea index remains moderate to severe. AHI 29.3.  Discussed the use of AI scribe  software for clinical note transcription with the patient, who gave verbal consent to proceed.   Relevant histories: .  Social  - Mother: Stroke, deceased due to Heart attack - No family history of hypertrophic cardiomyopathy. - no HX of SCD; two children ROS: As per HPI.   Studies Reviewed: .     Cardiac Studies & Procedures   ______________________________________________________________________________________________     ECHOCARDIOGRAM  ECHOCARDIOGRAM COMPLETE 06/19/2024  Narrative ECHOCARDIOGRAM REPORT    Patient Name:   [facility] Date of Exam: 06/19/2024 Medical Rec #:  [id number]            Height:       68.0 in Accession #:    [id number]           Weight:       248.8 lb Date of Birth:  03/12/1956            BSA:          2.242 m Patient Age:    68 years             BP:           136/86 mmHg Patient Gender: F                    HR:           67 bpm. Exam Location:  [address]  Procedure: 2D Echo, 3D Echo, Cardiac Doppler and Color Doppler (Both Spectral and Color Flow Doppler were utilized during procedure).  Indications:  I42.1 HOCM  History:        Patient has prior history of Echocardiogram examinations, most recent 12/16/2021. Cardiomyopathy and Hypertrophic Cardiomyopathy, COPD, Signs/Symptoms:Shortness of Breath and Murmur; Risk Factors:Dyslipidemia, Sleep Apnea, Hypertension, Former Smoker and Family History of Coronary Artery Disease.  Sonographer:    Heather Hawks RDCS Referring Phys: SHENG L HALEY  IMPRESSIONS   1. Basal septal hypertrophy Turbulent flow through LVOT but no gradient at rest. . Left ventricular ejection fraction, by estimation, is 70 to 75%. The left ventricle has hyperdynamic function. The left ventricle has no regional wall motion abnormalities. mild to moderate left ventricular hypertrophy. Left ventricular diastolic parameters are indeterminate. 2. Right ventricular systolic function is normal. The right  ventricular size is normal. 3. Left atrial size was moderately dilated. 4. The mitral valve is normal in structure. Mild mitral valve regurgitation. 5. The aortic valve is tricuspid. Aortic valve regurgitation is not visualized. Aortic valve sclerosis/calcification is present, without any evidence of aortic stenosis. 6. The inferior vena cava is normal in size with greater than 50% respiratory variability, suggesting right atrial pressure of 3 mmHg.  FINDINGS Left Ventricle: Basal septal hypertrophy Turbulent flow through LVOT but no gradient at rest. Left ventricular ejection fraction, by estimation, is 70 to 75%. The left ventricle has hyperdynamic function. The left ventricle has no regional wall motion abnormalities. The left ventricular internal cavity size was normal in size. Mild to moderate left ventricular hypertrophy. Left ventricular diastolic parameters are indeterminate.  Right Ventricle: The right ventricular size is normal. Right vetricular wall thickness was not assessed. Right ventricular systolic function is normal.  Left Atrium: Left atrial size was moderately dilated.  Right Atrium: Right atrial size was normal in size.  Pericardium: There is no evidence of pericardial effusion.  Mitral Valve: The mitral valve is normal in structure. Mild mitral valve regurgitation.  Tricuspid Valve: The tricuspid valve is normal in structure. Tricuspid valve regurgitation is trivial.  Aortic Valve: The aortic valve is tricuspid. Aortic valve regurgitation is not visualized. Aortic valve sclerosis/calcification is present, without any evidence of aortic stenosis.  Pulmonic Valve: The pulmonic valve was normal in structure. Pulmonic valve regurgitation is trivial.  Aorta: The aortic root and ascending aorta are structurally normal, with no evidence of dilitation.  Venous: The inferior vena cava is normal in size with greater than 50% respiratory variability, suggesting right atrial  pressure of 3 mmHg.  IAS/Shunts: No atrial level shunt detected by color flow Doppler.   LEFT VENTRICLE PLAX 2D LVIDd:         5.00 cm   Diastology LVIDs:         2.80 cm   LV e' medial:    6.91 cm/s LV PW:         1.00 cm   LV E/e' medial:  10.1 LV IVS:        1.00 cm   LV e' lateral:   8.10 cm/s LVOT diam:     2.10 cm   LV E/e' lateral: 8.6 LV SV:         124 LV SV Index:   55 LVOT Area:     3.46 cm LV IVRT:       134 msec   RIGHT VENTRICLE RV Basal diam:  3.70 cm     PULMONARY VEINS RV S prime:     12.30 cm/s  A Reversal Velocity: 35.70 cm/s TAPSE (M-mode): 2.8 cm      Diastolic Velocity:  42.00 cm/s S/D Velocity:  1.50 Systolic Velocity:   62.80 cm/s  LEFT ATRIUM             Index        RIGHT ATRIUM           Index LA diam:        4.00 cm 1.78 cm/m   RA Pressure: 3.00 mmHg LA Vol (A2C):   79.8 ml 35.59 ml/m  RA Area:     15.40 cm LA Vol (A4C):   96.3 ml 42.94 ml/m  RA Volume:   40.30 ml  17.97 ml/m LA Biplane Vol: 90.9 ml 40.54 ml/m AORTIC VALVE LVOT Vmax:   140.50 cm/s LVOT Vmean:  94.300 cm/s LVOT VTI:    0.358 m  AORTA Ao Root diam: 3.10 cm Ao Asc diam:  3.00 cm  MITRAL VALVE               TRICUSPID VALVE MV Area (PHT): cm         Estimated RAP:  3.00 mmHg MV Decel Time: 240 msec MV E velocity: 69.75 cm/s  SHUNTS MV A velocity: 98.65 cm/s  Systemic VTI:  0.36 m MV E/A ratio:  0.71        Systemic Diam: 2.10 cm  Vina Gull MD Electronically signed by Vina Gull MD Signature Date/Time: 06/19/2024/6:55:48 PM    Final    MONITORS  LONG TERM MONITOR (3-14 DAYS) 05/04/2022  Narrative Patch Wear Time:  2 days and 22 hours (2023-11-08T01:28:23-0500 to 2023-11-10T23:56:38-0500)  Patient had a min HR of 50 bpm (sinus bradycardia), max HR of 111 bpm (sinus tachycardia), and avg HR of 68 bpm (normal sinus rhythm). Predominant underlying rhythm was Sinus Rhythm. Isolated SVEs were rare (<1.0%), SVE Couplets were rare (<1.0%), and SVE Triplets were  rare (<1.0%). Isolated VEs were rare (<1.0%), and no VE Couplets or VE Triplets were present.  Impression: No arrhythmias detected. Rare ectopy.  Darryle T. Barbaraann, MD, [facility]  [facility] [address] [place], [place] [address] [phone] 7:01 PM     CARDIAC MRI  MR CARDIAC MORPHOLOGY W WO CONTRAST 03/10/2022  Narrative CLINICAL DATA:  Hypertrophic cardiomyopathy suspected, further testing hypertrophic cardiomyopathy  COMPARISON: Echo 12/16/21  EXAM: MR CARDIA MORPHOLOGY WITHOUT AND WITH CONTRAST; MR CARDIAC VELOCITY FLOW MAPPING  TECHNIQUE: The patient was scanned on a 1.5 Tesla GE magnet. A dedicated cardiac coil was used. Functional imaging was done using Fiesta sequences. 2,3, and 4 chamber views were done to assess for RWMA's. Modified Simpson's rule using a short axis stack was used to calculate an ejection fraction on a dedicated work [employer]. The patient received 10mL GADAVIST  GADOBUTROL  1 MMOL/ML IV SOLN. After 10 minutes inversion recovery sequences were used to assess for infiltration and scar tissue. Phase contrast velocity encoded images obtained.  This examination is tailored for evaluation cardiac anatomy and function and provides very limited assessment of noncardiac structures, which are accordingly not evaluated during interpretation. If there is clinical concern for extracardiac pathology, further evaluation with CT imaging should be considered.  FINDINGS: LEFT VENTRICLE:  Normal left ventricular chamber size.  Moderately increased LV wall thickness, asymmetric, maximal wall thickness: 15 mm, location: Basal anteroseptum.  Flow dephasing suggests left ventricular outflow tract obstruction.  No systolic anterior motion of the mitral valve.  No evidence of left ventricular apical aneurysm.  Findings are consistent with hypertrophic cardiomyopathy with obstruction. Morphologic subtype:  Sigmoid.  Normal left ventricular systolic function.  LVEF =  58%  There are no regional wall motion abnormalities.  No myocardial edema, T2 = 51 ms  Normal first pass perfusion.  There is no post contrast delayed myocardial enhancement.  Normal T1 myocardial nulling kinetics suggest against a diagnosis of cardiac amyloidosis.  ECV = 25%, normal range.  RIGHT VENTRICLE:  Normal right ventricular chamber size.  Normal right ventricular wall thickness.  Normal right ventricular systolic function.  RVEF = 53%  There are no regional wall motion abnormalities.  No post contrast delayed myocardial enhancement.  ATRIA:  Mild left atrial enlargement.  Normal right atrial size.  VALVES: Flow quantitation performed. Trivial AI (regurgitant fraction 5%), no PI.  Assessment of MR and TR performed, but not reported due to data quality.  Visually, no significant mitral or tricuspid regurgitation on this exam.  PERICARDIUM:  Normal pericardium.  Small pericardial effusion.  OTHER: No significant extracardiac findings.  MEASUREMENTS: Qp/Qs: 0.93  Left ventricle:  LV Female  LV EF: 58% (normal 56-78%)  Absolute volumes:  LV EDV: (normal 52-141 mL)  LV ESV: 69mL (Normal 13-51 mL)  LV SV: 95mL (Normal 33-97 mL)  CO: 5.6L/min (Normal 2.7-6.0 L/min)  Indexed volumes:  LV EDV: 56mL/sq-m (normal 41-81 mL/sq-m)  LV ESV: 48mL/sq-m (Normal 12-21 mL/sq-m)  LV SV: 3mL/sq-m (normal 26-56 mL/sq-m)  CI: 2.41L/min/sq-m (normal 1.8-3.8 L/min/sq-m)  Right ventricle:  RV female  RV EF: 53% (normal 47-80%)  Absolute volumes:  RV EDV: 139 mL (Normal 58-154 mL)  RV ESV: 66 mL (Normal 12-68 mL)  RV SV: 73 mL (Normal 35-98 mL)  CO: 4.3 L/min (Normal 2.7-6 L/min)  Indexed volumes:  RV EDV: 60 ML/sq-m (Normal 48-87 mL/sq-m)  RV ESV: 28 mL/sq-m (Normal 11-28 mL/sq-m)  RV SV: 32 mL/sq-m (Normal 27-57 mL/sq-m)  CI: 1.86 L/min/sq-m (Normal 1.8-3.8  L/min/sq-m)  IMPRESSION: 1. 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  2. Moderate asymmetric LVH, 15 mm at the basal anteroseptum, with flow dephasing suggestive of LVOT obstruction. No systolic anterior motion of the mitral valve. Normal left ventricular chamber size and function, LVEF 58%.  3. No post contrast delayed myocardial enhancement. No myocardial edema.  4.  Normal right ventricular chamber size and function, RVEF 53%.  5. Small circumferential pericardial effusion. No pericardial delayed enhancement.   Electronically Signed By: Soyla Merck M.D. On: 03/12/2022 15:34   ______________________________________________________________________________________________       Physical Exam:    VS:  BP 132/80 (BP Location: Right Arm)   Pulse (!) 59   Ht 5' 8 (1.727 m)   Wt 254 lb 12.8 oz (115.6 kg)   SpO2 96%   BMI 38.74 kg/m    Wt Readings from Last 3 Encounters:  06/22/24 254 lb 12.8 oz (115.6 kg)  05/25/24 248 lb 12.8 oz (112.9 kg)  05/14/24 254 lb 3.2 oz (115.3 kg)    Gen: no distress, morbid obesity   Neck: No JVD Cardiac: No Rubs or Gallops, standing systolic Murmur, regular bradycardia, +2 radial pulses Respiratory: Clear to auscultation bilaterally, normal effort, normal  respiratory rate GI: Soft, nontender, non-distended  MS: No  edema;  moves all extremities Integument: Skin feels warm Neuro:  At time of evaluation, alert and oriented to person/place/time/situation  Psych: Normal affect, patient feels ok     ASSESSMENT AND PLAN: .    Hypertrophic Cardiomyopathy - Sigmoid septum Variant - no gradient on Valsalva  - suspicion of Fabry's/Danon/Noonan's or other mimics of HCM: low - Gene variant:  Pending - NYHA II  - Non HCM Contributors to disease/status  Sinus bradycardia Heart rate at 59 bpm, likely due to beta-blocker use. No current symptoms of dizziness or syncope. -  Continue current beta-blocker regimen.Unable to trial Dual AV nodal therapy  Chronic obstructive pulmonary disease Mild COPD with exertional dyspnea. Managed with Trelegy and albuterol  inhaler. Symptoms include shortness of breath with walking and exertion. Smoking history of 42 years. - Continue Trelegy and albuterol  inhaler as needed.  Morbid obesity Obstructive sleep apnea Moderate to severe obstructive sleep apnea with an AHI of 23 despite CPAP use. Discussed potential benefits of weight loss on sleep apnea management. - Will assess cost-effectiveness of Zepbound for weight loss. - If cost-effective, will initiate Zepbound for weight loss.  Family history reviewed, Discussed family screening  - echo screening for children; we will get   SCD  Assessment Risk of SCD at 5 years(%) 1.08 Recommendation Based on the absence of risk factors, this patient does not have an indication for an ICD (Class 3 - No Benefit) - we will get a repeat Bike Echo and heart monitor to re-assess from 2023  Atrial fibrillation Assessment  - HCM-AF score 21 - Atrial arrhythmia management: repeat monitor   Symptomatic plan: - Hypertrophic cardiomyopathy without resting LVOT obstruction Non-obstructive hypertrophic cardiomyopathy with maximal septal thickness of 15 mm. No resting LVOT obstruction on echocardiogram. Symptoms include exertional dyspnea. Risk of atrial fibrillation is 11% over the next five years. Risk of sudden cardiac death is approximately 1% over the next five years. Genetic testing discussed for family screening and potential impact on life insurance. Life expectancy with well-managed HCM is unchanged. - Ordered bike echo to assess for obstructive physiology. - Repeated heart monitor to screen for atrial fibrillation. - Consulted clinical geneticist for genetic testing discussion. - If obstructive physiology is found, will consider specialized medications.  If obstructive physiology bring  back for CMI discussion If cost effective for GLP1-RA (no MEN2a or thyroid  carcinoma) bring back for teaching If now, three months for clinical trial discussion then co-mgmt with Dr. Barbaraann.  Time:   I have spent a total of 66 minutes with the patient reviewing notes, imaging, EKGs, labs,  and examining the patient as well as establishing an assessment and plan that was discussed personally with the patient. Discussed disease state education , using shared decision making tools and cardiac modeling , and clinical trails. Reviewed care and plan in collaboration with clinica genetics.   Stanly Leavens, MD FASE [facility] [place] Cardiologist [facility]  [address] [place], [address] [place], [place] [address] [phone]  4:53 PM  "

## 2024-06-22 NOTE — Progress Notes (Unsigned)
 Enrolled patient for a 7 day Zio XT monitor to be mailed to patients home.

## 2024-06-22 NOTE — Patient Instructions (Addendum)
 Medication Instructions:  Your physician recommends that you continue on your current medications as directed. Please refer to the Current Medication list given to you today.  *If you need a refill on your cardiac medications before your next appointment, please call your pharmacy*  Lab Work: NONE    Testing/Procedures: Your physician has requested that you have a stress echocardiogram. For further information please visit [email or web address] Please follow instruction sheet as given.    Do not eat, drink or use tobacco products 4 hour prior to the test. Dress prepared to exercise in a comfortable, two piece clothing outfit and walking shoes. Do not wear any perfume, cologne, aftershave or lotion. Bring any current prescription medications with you the day of the test. Notify the office 24 hours in advance if you cannot keep this appointment. If you have any questions please call [phone].   Please note: We ask at that you not bring children with you during ultrasound (echo/ vascular) testing. Due to room size and safety concerns, children are not allowed in the ultrasound rooms during exams. Our front office staff cannot provide observation of children in our lobby area while testing is being conducted. An adult accompanying a patient to their appointment will only be allowed in the ultrasound room at the discretion of the ultrasound technician under special circumstances. We apologize for any inconvenience.    Your physician has requested that you wear a heart monitor.   Follow-Up: At [facility], you and your health needs are our priority.  As part of our continuing mission to provide you with exceptional heart care, our providers are all part of one team.  This team includes your primary Cardiologist (physician) and Advanced Practice Providers or APPs (Physician Assistants and Nurse Practitioners) who all work together to provide you with the care you need, when you need  it.  Your next appointment:   3 month(s)  Provider:   Orren Fabry, PA-C          Other Instructions ZIO XT- Long Term Monitor Instructions  Your physician has requested you wear a ZIO patch monitor for 7 days.  This is a single patch monitor. [employer] supplies one patch monitor per enrollment. Additional stickers are not available. Please do not apply patch if you will be having a Nuclear Stress Test,  Echocardiogram, Cardiac CT, MRI, or Chest Xray during the period you would be wearing the  monitor. The patch cannot be worn during these tests. You cannot remove and re-apply the  ZIO XT patch monitor.  Your ZIO patch monitor will be mailed 3 day USPS to your address on file. It may take 3-5 days  to receive your monitor after you have been enrolled.  Once you have received your monitor, please review the enclosed instructions. Your monitor  has already been registered assigning a specific monitor serial # to you.  Billing and Patient Assistance Program Information  We have supplied [employer] with any of your insurance information on file for billing purposes. [employer] offers a sliding scale Patient Assistance Program for patients that do not have  insurance, or whose insurance does not completely cover the cost of the ZIO monitor.  You must apply for the Patient Assistance Program to qualify for this discounted rate.  To apply, please call [employer] at [phone], select option 4, select option 2, ask to apply for  Patient Assistance Program. Meredeth will ask your household income, and how many people  are in your household. They will quote your  out-of-pocket cost based on that information.  [employer] will also be able to set up a 68-month, interest-free payment plan if needed.  Applying the monitor   Shave hair from upper left chest.  Hold abrader disc by orange tab. Rub abrader in 40 strokes over the upper left chest as  indicated in your monitor instructions.  Clean area with 4  enclosed alcohol pads. Let dry.  Apply patch as indicated in monitor instructions. Patch will be placed under collarbone on left  side of chest with arrow pointing upward.  Rub patch adhesive wings for 2 minutes. Remove white label marked 1. Remove the white  label marked 2. Rub patch adhesive wings for 2 additional minutes.  While looking in a mirror, press and release button in center of patch. A small green light will  flash 3-4 times. This will be your only indicator that the monitor has been turned on.  Do not shower for the first 24 hours. You may shower after the first 24 hours.  Press the button if you feel a symptom. You will hear a small click. Record Date, Time and  Symptom in the Patient Logbook.  When you are ready to remove the patch, follow instructions on the last 2 pages of Patient  Logbook. Stick patch monitor onto the last page of Patient Logbook.  Place Patient Logbook in the blue and white box. Use locking tab on box and tape box closed  securely. The blue and white box has prepaid postage on it. Please place it in the mailbox as  soon as possible. Your physician should have your test results approximately 7 days after the  monitor has been mailed back to [facility].  Call [facility] Customer Care at [phone] if you have questions regarding  your ZIO XT patch monitor. Call them immediately if you see an orange light blinking on your  monitor.  If your monitor falls off in less than 4 days, contact our Monitor department at [phone].  If your monitor becomes loose or falls off after 4 days call [employer] at [phone] for  suggestions on securing your monitor

## 2024-06-25 ENCOUNTER — Encounter: Payer: Self-pay | Admitting: Internal Medicine

## 2024-06-26 ENCOUNTER — Encounter: Payer: Self-pay | Admitting: Acute Care

## 2024-06-26 ENCOUNTER — Encounter: Payer: Self-pay | Admitting: Emergency Medicine

## 2024-06-26 ENCOUNTER — Telehealth: Payer: Self-pay | Admitting: Cardiovascular Disease

## 2024-06-26 ENCOUNTER — Ambulatory Visit: Admitting: Acute Care

## 2024-06-26 ENCOUNTER — Telehealth: Payer: Self-pay | Admitting: Acute Care

## 2024-06-26 VITALS — BP 138/90 | HR 61 | Temp 98.4°F | Ht 68.0 in | Wt 252.6 lb

## 2024-06-26 DIAGNOSIS — Z87891 Personal history of nicotine dependence: Secondary | ICD-10-CM

## 2024-06-26 DIAGNOSIS — R911 Solitary pulmonary nodule: Secondary | ICD-10-CM | POA: Diagnosis not present

## 2024-06-26 DIAGNOSIS — E662 Morbid (severe) obesity with alveolar hypoventilation: Secondary | ICD-10-CM

## 2024-06-26 DIAGNOSIS — Z6838 Body mass index (BMI) 38.0-38.9, adult: Secondary | ICD-10-CM

## 2024-06-26 DIAGNOSIS — E669 Obesity, unspecified: Secondary | ICD-10-CM | POA: Diagnosis not present

## 2024-06-26 DIAGNOSIS — R9389 Abnormal findings on diagnostic imaging of other specified body structures: Secondary | ICD-10-CM

## 2024-06-26 DIAGNOSIS — R942 Abnormal results of pulmonary function studies: Secondary | ICD-10-CM

## 2024-06-26 DIAGNOSIS — G4733 Obstructive sleep apnea (adult) (pediatric): Secondary | ICD-10-CM

## 2024-06-26 NOTE — Telephone Encounter (Signed)
"  ° °  Pre-operative Risk Assessment    Patient Name: Veronica Solomon  DOB: Nov 10, 1955 MRN: [id number]   Date of last office visit: 06/22/24 Date of next office visit: n/a   Request for Surgical Clearance    Procedure:  bronchoscopy biopsy   Date of Surgery:  Clearance 07/16/24                                Surgeon:  Dr. Shelah Surgeon's Group or Practice Name:  [facility]  Phone number:  [phone] Fax number:  [phone]   Type of Clearance Requested:   - Medical    Type of Anesthesia:  General    Additional requests/questions:     SignedBarbee DELENA Sharps   06/26/2024, 2:26 PM   "

## 2024-06-26 NOTE — Telephone Encounter (Signed)
 Please schedule the following:  Provider performing procedure: BYrum Diagnosis:  Right lower lobe nodule , growing, low PET avidity Which side if for nodule / mass?  Right  Procedure:  Navigational bronchoscopy with biopsies  Has patient been spoken to by Provider and given informed consent?  Yes, by Lauraine Lites NP Anesthesia:  General Do you need Fluro?  Yes Duration of procedure:  1.5 Date:  07/10/2024 Alternate Date: 07/16/2024  Time:  Last opening on 07/10/24 Fourth opening on 07/16/2024 Location:  [facility] Does patient have OSA?  Yes, on BiPAP DM?  No Or Latex allergy?  No Medication Restriction/ Anticoagulate/Antiplatelet: No blood thinner Pre-op Labs Ordered:determined by Anesthesia Imaging request:  Last CT chest 06/21/2024 (If, SuperDimension CT Chest, please have STAT courier sent to ENDO)

## 2024-06-26 NOTE — Progress Notes (Signed)
 Pre Test Genetic Consult  Referral Reason  Veronica Solomon, a new HCM patient, is referred for genetic consult and testing of hypertrophic cardiomyopathy.   Personal Medical Information Veronica Solomon (III.2 on pedigree) is a 69 year-old Caucasian lady with a past medical history of fibromyalgia and COPD. She reports being evaluated for sleep apnea at age 75 when she was diagnosed with HCM.   Cardiac MRI (03/10/24) demonstrated sigmoid hypertrophy with moderately increased LV wall thickness of 1.5 cm in the basal anteroseptum, LVEF = 58% and small circumferential pericardial effusion. No pericardial delayed enhancement.  She reports being mostly asymptomatic with occasional fatigue. Shortness of breath for the last 40 years that she suspects from COPD as she smoked for nearly 42 years. Denies chest discomfort, heart palpitations, dizziness or syncope.   Traditional Risk Factors Ismael was diagnosed with HTN at 45 that she states was well-controlled initially but has been labile lately.  Family history  Relation to Proband Pedigree # Current age Heart condition/age of onset Notes  Son IV.4 20 None Echo/EKG to be done, lives in [place], [place]  Daughter IV.5 41 None Echo/EKG to be done, has anxiety  Grandchildren, 4 V.1-V.4 15-5 None         Sister III.1 74 None Echo/EKG to be done, cardiac issues- details unknown  Nephews, niece IV.1-IV.3 51, 48, 47 None         Father II.3 Deceased None Died @ 70- lung disease  Paternal uncle II.2 Deceased None Died @ 54s- old age  Paternal aunt II.1 Deceased None Died @ 80s- old age  Paternal grandfather 1.1 Deceased None Died @ 88s- lung disease  Paternal grandmother I.2 Deceased None Died @ 70s-poor health, stroke        Mother II.4 Deceased None Died suddenly @ 63. Autopsy not done. Prior history of stroke  Maternal aunts, 2 II.5-II.6 Deceased None Died of ? 1 died @ 30s  Maternal uncle II.1-II.3 Deceased None Died @ 4s- had cardiac issues at time  of death, details unknown  Maternal grandfather I.3 Deceased None Died @ 84s- cancer  Maternal grandmother I.4 Deceased None Died @ 57s- diabetes compl.   Genetics [name] was counseled on the genetics of hypertrophic cardiomyopathy (HCM). I explained to the patient that this is an autosomal dominant condition with incomplete penetrance i.e. not all individuals harboring the HCM mutation will present clinically with HCM, and age-related penetrance where clinical presentation of HCM increases with advanced age. Variability in clinical expression is also seen in families with HCM with affected family members presenting clinically at different ages and with symptoms ranging from mild to severe.  Since HCM is an autosomal dominant condition, first degree-relatives are at a 50% risk of inheriting this condition. They should seek regular surveillance for HCM.  First-degree relatives include her son, daughter and sister. At this time her grandchildren, nephews and niece do not need to undergo screening- they can be screened if they are symptomatic or if their parent is found to have HCM.    Clinical screening of first-degree relatives involves echocardiogram and EKG at regular intervals, frequency is typically determined by age, with children undergoing screening every year until the age of 32 and those over the age of 21 getting screened every 3-5 years until the age of 55. Patient verbalized understanding of this.  Also briefly discussed the inheritance pattern and treatment /management plans for the infiltrative cardiomyopathies that present as HCM phenocopies. About 8-10% of HCM patients can have compound and digenic sarcomeric  mutations for HCM  Patient should be aware that genetic testing is a probabilistic test dependent upon age and severity of presentation, presence of risk factors for HCM and importantly family history of HCM or sudden death in first-degree relatives. The potential outcomes of  genetic testing and subsequent management of at-risk family members is listed below-  If a mutation is not identified then, it is important that he understands that HCM is a genetic condition and can be passed down to his children. All first-degree relatives should undergo regular screening for HCM.  A negative test result can be due to limitations of the genetic test.   There is also the likelihood of identifying a Variant of unknown significance. This result means that the variant has not been detected in a statistically significant number of HCM patients and/or functional studies have not been performed to verify its pathogenicity. This VUS can be tested in the family to see if it segregates with disease. If a VUS is found, first-degree relatives should undergo regular clinical screening for HCM, but genetic testing for the VUS is otherwise not warranted.  If a pathogenic variant is reported, then first-degree family members can get tested for this variant. If they test positive, it is likely they will develop HCM. In light of variable expression and incomplete penetrance associated with HCM, it is not possible to predict when they will manifest clinically with HCM. It is recommended that family members that test positive for the familial pathogenic variant pursue clinical screening for HCM. Family members that test negative for the familial mutation need not pursue periodic screening for HCM, but seek care if symptoms develop.   Impression  Caisley was found to have cardiac wall thickness suggestive of HCM at age 13 in the presence of other cardiac loading conditions that can lead to cardiac hypertrophy. There is no family history of HCM but reports sudden death in her mother. It is likely she has a de novo mutation for HCM or has inherited this from one of her parents with evident reduced penetrance.  Genetic testing is recommended to confirm her diagnosis. This test should include the major  sarcomeric genes involved in HCM, namely MYBPPC3, MYH7, TNNI3, TNNT2, TPM1, TNNC1, ACTC1, MYL2 and MYL3. It should also include the genes involved in HCM phenocopies as cardiac-predominant forms of these conditions present clinically as HCM. These include genes for Fabry disease (GLA), Danon disease (LAMP2), WPW syndrome (PRKAG2), Familial transthyretin amyloidosis (TTR) and the genes definitively associated with HCM, namely phospholamban (PLN) and desmin (DES).  In addition, patient should be aware of protections afforded by the Genetic Information Non-Discrimination Act (GINA). GINA protects a patient from losing their employment or health insurance based on their genotype. However, these protections do not cover life insurance and disability. Explained to the patient that family members that are found to have the familial genetic mutation will be denied life insurance even if they are asymptomatic and do not exhibit clinical signs of HCM. Patient verbalized understanding and will discuss this with their family.   Please note that the patient has not been counseled in this visit on other personal, cultural or ethical issues that patient may face due to their heart condition.   Plan After a thorough discussion of the risk and benefits of genetic testing for HCM, Berneita declines genetic testing due to insurance non-coverage for her HCM test. She will discuss HCM screening guidelines and procuring life insurance with her kids.      Danford Pac,  Ph.D, [facility] Clinical Molecular Geneticist

## 2024-06-26 NOTE — Progress Notes (Signed)
 "  History of Present Illness Veronica Solomon is a 69 y.o. female former smoker followed through the lung cancer screening program. She was referred for lung nodule consult for an abnormal lung cancer screening scan.  Synopsis Veronica Solomon is a 69 year old female who presents for follow-up of a lung nodule.   In February 2024, she had a normal lung cancer screening scan. However, by February 2025, a 1.6 by 2 cm central right lower lobe thickening was identified, leading to a six-month follow-up scan instead of the usual annual scan.   The follow-up scan revealed that the atypical cyst had grown to 1.4 by 1.8 cm, with an increasing ground glass component. A PET scan showed low uptake with an SUV of 1.8. No weight loss or hemoptysis is reported.   Her family history is significant for lung cancer in her father, who was a smoker. She is a former smoker herself. 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 She reports no occupational exposures that could have contributed to her condition.   We discussed biopsy now to better evaluate this finding, vs a 3 month follow up scan to monitor for growth. Patient opted for the 3 month follow up scan. This will be due 06/2024. She will follow up with me 1-2 weeks after the scan to review the results.If there is any additional growth, we will biopsy at that time. She is here today to follow up on CT. Chest results     06/26/2024 Discussed the use of AI scribe software for clinical note transcription with the patient, who gave verbal consent to proceed.  History of Present Illness  Veronica Solomon is a 69 year old female with a pulmonary nodule who presents for follow-up recent CT Chest imaging to evaluate right lower lobe nodule for stability. .  She has a pulmonary nodule located in the right lower lobe, which has been under surveillance. The nodule was noted to be mildly hypermetabolic on a PET scan and has  shown progressive growth over time, with the most recent measurements being 2.2 by 1.1 cm, compared to a previous size of 1.4 by 1.8 cm. She had previously opted for a three-month follow-up instead of an immediate biopsy. After review of her most recent CT Chest, with additional growth over a short interval, plan will be to move forward with bronchoscopy with biopsy. We have reviewed the risks to include bleeding, infection, puncture of the lung, and adverse reaction to anesthesia. She has agreed to proceed with biopsy to evaluate the right lower lobe nodule.She has a family member who can drive her there, stay and drive her home after the procedure.  The  procedure will be done by Dr. Lamar Chris.  Pt is currently wearing a Xyo patch . She has hypertrophic cardiomyopathy and they are evaluating for a fib. She is currently not on any blood thinners. We will have cardiology medically clear her for the procedure.   Blood pressure was elevated today in the office. Upon recheck it was   She wears a BiPAP and her apnea index remains quite high ( 29.3) despite BiPAP.     Test Results: CT Chest 06/21/2024 2.2 x 1.1 cm subsolid right lower lobe nodule with cystic air component and peripheral mildly irregular marginal density, very slowly progressive over the last 6 years, and recently with low grade metabolic activity (SUV max of 1.8 on 03/12/24). Indolent low grade adenocarcinoma a distinct possibility  although progression over the last 6.5 years has been very slow. 2. Stable subsolid 0.4 x 0.6 cm left lower lobe nodule, likewise very slowly progressive over the last 6 years, meriting surveillance. 3. Centrilobular emphysema, thoracic aortic, coronary artery, and branch vessel atheromatous vascular calcifications, right apical pleuroparenchymal scarring, suspected hemangioma in the T9 vertebral body, and a small Morgagni hernia containing adipose tissue.  PET scan 03/12/2024 No hypermetabolic lymph  nodes. Persistent atypical pulmonary cyst with eccentric ground-glass wall thickening along its lateral aspect is again seen in the central right lower lobe. This measures 18 mm on image 46/7. Mild FDG activity is seen corresponding to this area of eccentric wall thickening, with SUV max of 1.8. No other suspicious pulmonary nodules identified on CT images.   Mild FDG activity associated with eccentric wall thickening of atypical pulmonary cyst in the central right lower lobe. Low-grade adenocarcinoma cannot be excluded.   No evidence of metastatic disease.     LDCT 02/22/2024 Centrilobular and paraseptal emphysema. Atypical pulmonary cyst with eccentric ground-glass wall thickening in the central right lower lobe measures 1.4 x 1.8 cm. The ground-glass component has slowly increased in size over time, now measuring approximately 16.3 mm, previously 8.4 mm on 03/03/2018. Additional pulmonary nodules measure 5.3 mm or less in size. No pleural fluid. Airway is unremarkable.   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 Lung-RADS 4B, suspicious. Additional imaging evaluation or consultation with Pulmonology or Thoracic Surgery recommended. Aortic atherosclerosis (ICD10-I70.0). Coronary artery calcification. 3.  Emphysema (ICD10-J43.9).      Latest Ref Rng & Units 05/14/2024    3:45 PM 08/13/2022    2:03 PM 11/12/2021    2:17 PM  CBC  WBC 4.0 - 10.5 K/uL 8.5  9.2  9.3   Hemoglobin 12.0 - 15.0 g/dL 85.6  86.4  86.8   Hematocrit 36.0 - 46.0 % 42.8  41.2  39.7   Platelets 150.0 - 400.0 K/uL 235.0  271  268.0        Latest Ref Rng & Units 05/14/2024    3:45 PM 08/13/2022    2:03 PM 05/17/2022    1:28 PM  BMP  Glucose 70 - 99 mg/dL 92  82  98   BUN 6 - 23 mg/dL 24  20  21    Creatinine 0.40 - 1.20 mg/dL 9.09  9.19  9.12   BUN/Creat Ratio 6 - 22 (calc)  SEE NOTE:     Sodium 135 - 145 mEq/L 140  140  140   Potassium 3.5 - 5.1 mEq/L 4.7  4.6  4.9   Chloride 96 - 112 mEq/L 103  103  103   CO2 19 - 32 mEq/L 31  28  29    Calcium  8.4 - 10.5 mg/dL 9.8  89.9  9.4     BNP No results found for: BNP  ProBNP    Component Value Date/Time   PROBNP 14.0 05/11/2021 1530    PFT    Component Value Date/Time   FEV1PRE 2.05 07/29/2021 1300   FEV1POST 2.10 07/29/2021 1300   FVCPRE 2.70 07/29/2021 1300   FVCPOST 2.73 07/29/2021 1300   TLC 5.37 07/29/2021 1300   DLCOUNC 19.76 07/29/2021 1300   PREFEV1FVCRT 76 07/29/2021 1300   PSTFEV1FVCRT 77 07/29/2021 1300    CT CHEST WO CONTRAST Result Date: 06/26/2024 EXAM: CT CHEST WITHOUT CONTRAST 06/21/2024 01:38:00 PM TECHNIQUE: CT of the chest was performed  without the administration of intravenous contrast. Multiplanar reformatted images are provided for review. Automated exposure control, iterative reconstruction, and/or weight based adjustment of the mA/kV was utilized to reduce the radiation dose to as low as reasonably achievable. COMPARISON: PET CT 03/12/2024 and CT scan 02/22/2024. CLINICAL HISTORY: Lung nodule, > 8mm. FINDINGS: MEDIASTINUM: Thoracic aortic, coronary artery, and branch vessel atheromatous vascular calcifications. Small morgagni hernia containing adipose tissue. LYMPH NODES: No mediastinal, hilar or axillary lymphadenopathy. LUNGS AND PLEURA: Centrilobular emphysema. 2.2 x 1.1 cm subsolid right lower lobe nodule on image 81 series 302 with cystic air component and peripheral mildly irregular marginal density, slowly progressive over the last 6 years, and recently with low grade metabolic activity (SUV max of 1.8 on 03/12/2024). Indolent low grade adenocarcinoma is a distinct possibility although progression over the last 6.5 years has been very slow. Right apical pleuroparenchymal scarring. Stable subsolid 0.4 x 0.6 cm left lower lobe nodule on image 107 series 302, very slowly progressive over the  last 6 years . Chronically stable 5 mm subpleural nodule in the left lower lobe on image 88 series 302, no change from 2019, considered benign. Chronically stable 2 mm left lower lobe nodule on image 82 series 302, no change from 03/03/2018, considered benign. SOFT TISSUES/BONES: Suspected hemangioma in the T9 vertebral body. UPPER ABDOMEN: Limited images of the upper abdomen demonstrates no acute abnormality. IMPRESSION: 1. 2.2 x 1.1 cm subsolid right lower lobe nodule with cystic air component and peripheral mildly irregular marginal density, very slowly progressive over the last 6 years, and recently with low grade metabolic activity (SUV max of 1.8 on 03/12/24). Indolent low grade adenocarcinoma a distinct possibility although progression over the last 6.5 years has been very slow. 2. Stable subsolid 0.4 x 0.6 cm left lower lobe nodule, likewise very slowly progressive over the last 6 years, meriting surveillance. 3. Centrilobular emphysema, thoracic aortic, coronary artery, and branch vessel atheromatous vascular calcifications, right apical pleuroparenchymal scarring, suspected hemangioma in the T9 vertebral body, and a small Morgagni hernia containing adipose tissue. Electronically signed by: Ryan Salvage MD MD 06/26/2024 08:39 AM EST RP Workstation: [id number]   ECHOCARDIOGRAM COMPLETE Result Date: 06/19/2024    ECHOCARDIOGRAM REPORT   Patient Name:   [facility] Date of Exam: 06/19/2024 Medical Rec #:  [id number]            Height:       68.0 in Accession #:    [id number]           Weight:       248.8 lb Date of Birth:  Mar 25, 1956            BSA:          2.242 m Patient Age:    68 years             BP:           136/86 mmHg Patient Gender: F                    HR:           67 bpm. Exam Location:  [address] Procedure: 2D Echo, 3D Echo, Cardiac Doppler and Color Doppler (Both Spectral            and Color Flow Doppler were utilized during procedure). Indications:    I42.1 HOCM  History:         Patient has prior history of Echocardiogram examinations, most  recent 12/16/2021. Cardiomyopathy and Hypertrophic Cardiomyopathy,                 COPD, Signs/Symptoms:Shortness of Breath and Murmur; Risk                 Factors:Dyslipidemia, Sleep Apnea, Hypertension, Former Smoker                 and Family History of Coronary Artery Disease.  Sonographer:    Heather Hawks RDCS Referring Phys: SHENG L HALEY IMPRESSIONS  1. Basal septal hypertrophy Turbulent flow through LVOT but no gradient at rest. . Left ventricular ejection fraction, by estimation, is 70 to 75%. The left ventricle has hyperdynamic function. The left ventricle has no regional wall motion abnormalities. mild to moderate left ventricular hypertrophy. Left ventricular diastolic parameters are indeterminate.  2. Right ventricular systolic function is normal. The right ventricular size is normal.  3. Left atrial size was moderately dilated.  4. The mitral valve is normal in structure. Mild mitral valve regurgitation.  5. The aortic valve is tricuspid. Aortic valve regurgitation is not visualized. Aortic valve sclerosis/calcification is present, without any evidence of aortic stenosis.  6. The inferior vena cava is normal in size with greater than 50% respiratory variability, suggesting right atrial pressure of 3 mmHg. FINDINGS  Left Ventricle: Basal septal hypertrophy Turbulent flow through LVOT but no gradient at rest. Left ventricular ejection fraction, by estimation, is 70 to 75%. The left ventricle has hyperdynamic function. The left ventricle has no regional wall motion abnormalities. The left ventricular internal cavity size was normal in size. Mild to moderate left ventricular hypertrophy. Left ventricular diastolic parameters are indeterminate. Right Ventricle: The right ventricular size is normal. Right vetricular wall thickness was not assessed. Right ventricular systolic function is normal. Left Atrium: Left atrial size  was moderately dilated. Right Atrium: Right atrial size was normal in size. Pericardium: There is no evidence of pericardial effusion. Mitral Valve: The mitral valve is normal in structure. Mild mitral valve regurgitation. Tricuspid Valve: The tricuspid valve is normal in structure. Tricuspid valve regurgitation is trivial. Aortic Valve: The aortic valve is tricuspid. Aortic valve regurgitation is not visualized. Aortic valve sclerosis/calcification is present, without any evidence of aortic stenosis. Pulmonic Valve: The pulmonic valve was normal in structure. Pulmonic valve regurgitation is trivial. Aorta: The aortic root and ascending aorta are structurally normal, with no evidence of dilitation. Venous: The inferior vena cava is normal in size with greater than 50% respiratory variability, suggesting right atrial pressure of 3 mmHg. IAS/Shunts: No atrial level shunt detected by color flow Doppler.  LEFT VENTRICLE PLAX 2D LVIDd:         5.00 cm   Diastology LVIDs:         2.80 cm   LV e' medial:    6.91 cm/s LV PW:         1.00 cm   LV E/e' medial:  10.1 LV IVS:        1.00 cm   LV e' lateral:   8.10 cm/s LVOT diam:     2.10 cm   LV E/e' lateral: 8.6 LV SV:         124 LV SV Index:   55 LVOT Area:     3.46 cm LV IVRT:       134 msec  RIGHT VENTRICLE RV Basal diam:  3.70 cm     PULMONARY VEINS RV S prime:     12.30 cm/s  A Reversal Velocity:  35.70 cm/s TAPSE (M-mode): 2.8 cm      Diastolic Velocity:  42.00 cm/s                             S/D Velocity:        1.50                             Systolic Velocity:   62.80 cm/s LEFT ATRIUM             Index        RIGHT ATRIUM           Index LA diam:        4.00 cm 1.78 cm/m   RA Pressure: 3.00 mmHg LA Vol (A2C):   79.8 ml 35.59 ml/m  RA Area:     15.40 cm LA Vol (A4C):   96.3 ml 42.94 ml/m  RA Volume:   40.30 ml  17.97 ml/m LA Biplane Vol: 90.9 ml 40.54 ml/m  AORTIC VALVE LVOT Vmax:   140.50 cm/s LVOT Vmean:  94.300 cm/s LVOT VTI:    0.358 m  AORTA Ao Root  diam: 3.10 cm Ao Asc diam:  3.00 cm MITRAL VALVE               TRICUSPID VALVE MV Area (PHT): cm         Estimated RAP:  3.00 mmHg MV Decel Time: 240 msec MV E velocity: 69.75 cm/s  SHUNTS MV A velocity: 98.65 cm/s  Systemic VTI:  0.36 m MV E/A ratio:  0.71        Systemic Diam: 2.10 cm Vina Gull MD Electronically signed by Vina Gull MD Signature Date/Time: 06/19/2024/6:55:48 PM    Final      Past medical hx Past Medical History:  Diagnosis Date   Adenomatous colon polyp    Allergy    C. difficile colitis    Cataract 2020   COPD (chronic obstructive pulmonary disease) (HCC) 2020   Depression 2010   Diverticulosis    Emphysema of lung (HCC) 2022   GERD (gastroesophageal reflux disease) 2020   Heart murmur 2022   Hyperlipidemia    Hypertension    IBS (irritable bowel syndrome)    Nasal congestion 06/14/1998   Neuromuscular disorder (HCC)    Oxygen  deficiency 2020   Sleep apnea 2020   Substance abuse (HCC) 1974   Ulcer 1964     Social History[1]  Ms.Coutant reports that she quit smoking about 12 years ago. Her smoking use included cigarettes. She started smoking about 54 years ago. She has a 42 pack-year smoking history. She has quit using smokeless tobacco. She reports current alcohol use. She reports that she does not use drugs.  Tobacco Cessation: Counseling given: Not Answered Tobacco comments: Quit 12/2011 Former smoker   Past surgical hx, Family hx, Social hx all reviewed.  Current Outpatient Medications on File Prior to Visit  Medication Sig   albuterol  (VENTOLIN  HFA) 108 (90 Base) MCG/ACT inhaler Inhale 2 puffs into the lungs every 6 (six) hours as needed for wheezing or shortness of breath.   atorvastatin  (LIPITOR) 20 MG tablet TAKE 1 TABLET(20 MG) BY MOUTH DAILY   celecoxib  (CELEBREX ) 200 MG capsule Take 1 capsule (200 mg total) by mouth 2 (two) times daily as needed for moderate pain.   cyanocobalamin  (VITAMIN B12) 1000 MCG/ML injection Inject 1 mL (1,000 mcg  total)  into the skin once a week.   DULoxetine  (CYMBALTA ) 60 MG capsule TAKE 2 CAPSULES(120 MG) BY MOUTH DAILY   HYDROcodone -acetaminophen  (NORCO) 10-325 MG tablet Take 1 tablet by mouth every 8 (eight) hours as needed.   meclizine  (ANTIVERT ) 50 MG tablet Take 1 tablet (50 mg total) by mouth 3 (three) times daily as needed.   metoprolol  tartrate (LOPRESSOR ) 25 MG tablet Take 1 tablet (25 mg total) by mouth 2 (two) times daily.   pregabalin  (LYRICA ) 75 MG capsule TAKE 1 CAPSULE BY MOUTH TWICE DAILY   traMADol  (ULTRAM ) 50 MG tablet TAKE 1 TABLET(50 MG) BY MOUTH EVERY 6 HOURS AS NEEDED   TRELEGY ELLIPTA  200-62.5-25 MCG/ACT AEPB Inhale 1 puff into the lungs daily.   Vibegron  (GEMTESA ) 75 MG TABS 1 po every day   cyanocobalamin  (VITAMIN B12) 1000 MCG/ML injection Inject 1 mL (1,000 mcg total) into the muscle every 30 (thirty) days. (Patient not taking: Reported on 06/26/2024)   No current facility-administered medications on file prior to visit.     Allergies[2]  Review Of Systems:  Constitutional:   No  weight loss, night sweats,  Fevers, chills, fatigue, or  lassitude.  HEENT:   No headaches,  Difficulty swallowing,  Tooth/dental problems, or  Sore throat,                No sneezing, itching, ear ache, nasal congestion, post nasal drip,   CV:  No chest pain,  Orthopnea, PND, swelling in lower extremities, anasarca, dizziness, palpitations, syncope.   GI  No heartburn, indigestion, abdominal pain, nausea, vomiting, diarrhea, change in bowel habits, loss of appetite, bloody stools.   Resp: + shortness of breath with exertion or at rest.  No excess mucus, no productive cough,  No non-productive cough,  No coughing up of blood.  No change in color of mucus.  No wheezing.  No chest wall deformity  Skin: no rash or lesions.  GU: no dysuria, change in color of urine, no urgency or frequency.  No flank pain, no hematuria   MS:  No joint pain or swelling.  No decreased range of motion.  No back  pain.  Psych:  No change in mood or affect. No depression or anxiety.  No memory loss.   Vital Signs BP (!) 154/87   Pulse 61   Temp 98.4 F (36.9 C) (Oral)   Ht 5' 8 (1.727 m)   Wt 252 lb 9.6 oz (114.6 kg)   SpO2 95%   BMI 38.41 kg/m    Physical Exam: Physical Exam GENERAL: No distress, alert and oriented times 3. EARS NOSE THROAT: No sinus tenderness, tympanic membranes clear, pale nasal mucosa, no oral exudate, no post nasal drip, no lymphadenopathy. CHEST: No wheeze, rales, dullness, no accessory muscle use, no nasal flaring, no sternal retractions. CARDIAC: S1, S2, regular rate and rhythm, no murmur. ABDOMINAL: Soft, non tender. ND, BS present, EXTREMITIES: No clubbing, cyanosis, edema. No obvious deformities NEUROLOGICAL: Normal strength. Alert and oriented x 3, MAE x 4 SKIN: No rashes, warm and dry. No obvious skin lesions PSYCHIATRIC: Normal mood and behavior.   Assessment/Plan  Assessment and Plan Assessment & Plan Solitary pulmonary nodule of the right lower lobe Nodule increased in size, previously mildly hypermetabolic on PET. Biopsy warranted due to growth. - Scheduled robotic assisted navigational bronchoscopy with biopsy on January 27th, 2026, as the last case of the day. - Instructed to be NPO prior to the procedure. - Ensured she has a trusted driver for the  procedure day. - Cardiology to medically clear patient for procedure as being evaluated for a TRIAL FIB - Will follow up with results approximately one week after the procedure.  OSA on BiPAP AHI remains 29.3 despite treatment. Plan Wear BiPAP nightly without fail. Setting change as is indicated by download Plan by cardiology is to get her approved for Zebound for weight loss.   Obesity  Plan Plan by cardiology is to get her approved for Zebound for weight loss.   I spent 25 minutes dedicated to the care of this patient on the date of this encounter to include pre-visit review of records,  face-to-face time with the patient discussing conditions above, post visit ordering of testing, clinical documentation with the electronic health record, making appropriate referrals as documented, and communicating necessary information to the patient's healthcare team.       Lauraine JULIANNA Lites, NP 06/26/2024  1:35 PM             [1]  Social History Tobacco Use   Smoking status: Former    Current packs/day: 0.00    Average packs/day: 1 pack/day for 42.0 years (42.0 ttl pk-yrs)    Types: Cigarettes    Start date: 12/28/1969    Quit date: 12/29/2011    Years since quitting: 12.5   Smokeless tobacco: Former   Tobacco comments:    Quit 12/2011  Vaping Use   Vaping status: Never Used  Substance Use Topics   Alcohol use: Yes    Comment: rarely   Drug use: No  [2]  Allergies Allergen Reactions   Paregoric Hives    REACTION: HIVES   Penicillins Rash    REACTION: RASH   Procaine Hcl Other (See Comments)   "

## 2024-06-26 NOTE — Patient Instructions (Signed)
 It is good to see you today. We have reviewed your most recent Ct Chest.  The right lower lobe nodule has increased in size on this most recent scan. We will move forward with a bronchoscopy with biopsy as we discussed  I have placed an order for a bronchoscopy with biopsies.  We have discussed the procedure in detail.  We have reviewed the risks and benefits of the procedure. These include bleeding, infection, puncture of the lung, and adverse reaction to anesthesia. You have agreed to proceed with biopsy to evaluate the right lower lobe nodule. Your procedure will be done by Dr. Lamar Chris. You will receive a letter today with date time and information pertaining to the procedure. You will need someone to drive you to the procedure, stay with you during the procedure, and stay with you after the procedure. You will also need someone to stay with you for 24 hours after anesthesia to ensure you have cleared and are doing well. You will follow-up with me 1 week after the procedure to review the results and to ensure you are doing well. Call if you need us  prior to the procedure or if you have any questions at all. Please contact office for sooner follow up if symptoms do not improve or worsen or seek emergency care

## 2024-06-26 NOTE — Telephone Encounter (Signed)
 Dr. Santo, patient's chart was reviewed for preoperative cardiac evaluation.  She was seen by you on 06/22/24 and is seeking clearance for video guided brochoscopy. According to protocol, we request that you comment on cardiac risk for upcoming procedure since office visit was less than 2 months ago.    Please route your response to p cv div preop.  Thank you, Rosaline EMERSON Bane, NP-C 06/26/2024, 2:34 PM

## 2024-06-26 NOTE — Telephone Encounter (Signed)
 Letter given by Louella. Case #[id number]. Sending to [employer] to [facility]

## 2024-06-27 NOTE — Telephone Encounter (Signed)
"  ° °  Primary Cardiologist: Darryle ONEIDA Decent, MD  Chart reviewed as part of pre-operative protocol coverage. Given past medical history and time since last visit, based on ACC/AHA guidelines, [facility] would be at acceptable risk for the planned procedure without further cardiovascular testing.   Patient should contact our office if she is having new symptoms that are concerning from a cardiac perspective to arrange a follow-up appointment.    I will route this recommendation to the requesting party via Epic fax function and remove from pre-op pool.  Please call with questions.  Rosaline EMERSON Bane, NP-C 06/27/2024, 7:21 AM [address] [place], [place] [address] Office [phone] Fax [phone]     "

## 2024-06-28 NOTE — Telephone Encounter (Signed)
 Thank you very much

## 2024-06-29 ENCOUNTER — Ambulatory Visit: Admitting: Acute Care

## 2024-07-13 ENCOUNTER — Encounter (HOSPITAL_COMMUNITY): Payer: Self-pay | Admitting: Emergency Medicine

## 2024-07-13 ENCOUNTER — Other Ambulatory Visit: Payer: Self-pay

## 2024-07-13 NOTE — Anesthesia Preprocedure Evaluation (Addendum)
 "                                  Anesthesia Evaluation    Airway        Dental   Pulmonary former smoker          Cardiovascular hypertension,   Echo 06/19/2024: IMPRESSIONS   1. Basal septal hypertrophy Turbulent flow through LVOT but no gradient  at rest. . Left ventricular ejection fraction, by estimation, is 70 to  75%. The left ventricle has hyperdynamic function. The left ventricle has  no regional wall motion  abnormalities. mild to moderate left ventricular hypertrophy. Left  ventricular diastolic parameters are indeterminate.   2. Right ventricular systolic function is normal. The right ventricular  size is normal.   3. Left atrial size was moderately dilated.   4. The mitral valve is normal in structure. Mild mitral valve  regurgitation.   5. The aortic valve is tricuspid. Aortic valve regurgitation is not  visualized. Aortic valve sclerosis/calcification is present, without any  evidence of aortic stenosis.   6. The inferior vena cava is normal in size with greater than 50%  respiratory variability, suggesting right atrial pressure of 3 mmHg.    MRI Cardiac 03/10/2022: IMPRESSION: 1. 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 2. Moderate asymmetric LVH, 15 mm at the basal anteroseptum, with flow dephasing suggestive of LVOT obstruction. No systolic anterior motion of the mitral valve. Normal left ventricular chamber size and function, LVEF 58%. 3. No post contrast delayed myocardial enhancement. No myocardial edema. 4.  Normal right ventricular chamber size and function, RVEF 53%. 5. Small circumferential pericardial effusion. No pericardial delayed enhancement.   Patch monitor 04/21/2022 - 04/23/2022: Impression: 1. No arrhythmias detected.  2. Rare ectopy.     Neuro/Psych    GI/Hepatic   Endo/Other    Renal/GU      Musculoskeletal   Abdominal   Peds  Hematology    Anesthesia Other Findings   Reproductive/Obstetrics                              Anesthesia Physical Anesthesia Plan  ASA:   Anesthesia Plan:    Post-op Pain Management:    Induction:   PONV Risk Score and Plan:   Airway Management Planned:   Additional Equipment:   Intra-op Plan:   Post-operative Plan:   Informed Consent:   Plan Discussed with:   Anesthesia Plan Comments: (PAT note written 07/13/2024 by Suheyb Raucci, PA-C.  Her primary cardiologist is Dr. Barbaraann. She was diagnosed with hypertrophic cardiomyopathy in 2023 after presenting for work-up for murmur. 12/2021 TTE showed EF 60-65% with moderate focal basal septal LVH, LVOT gradient 80 with peak Valsalva, normal RV, mild MR, cannot rule out a PFO. 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 CT showed mild coronary calcifications. Normal heart monitor 04/2022. At her 05/25/2024 APP overdue follow-up, she reported persistent/chronic dyspnea, no significant functional limitation, no chest pain or syncope. Referred to structural heart cardiologist Dr. Santo and evaluated on 06/22/2024. Updated TTE on 06/19/2024 showed basal septal hypertrophy, turbulent flow through LVOT but no gradient at rest, LVEF 70 to 75%, no regional wall motion abnormalities, mild to moderate LVH, normal RV systolic function, mild MR. Known DOE with underlying  COPD. Asymptomatic, mild sinus bradycardia. Moderate to severe obstructive sleep apnea with an AHI of 23 despite CPAP use. He calculated her SCD risk at 5 years(%) as 1.08. No indication for ICD at this time. He did want to repeat Bike echo to assess for obstructive physiology and heart monitor to assess for atrial arrhythmias (given 11% risk over next five years). If obstructive physiology noted then will discuss consideration of cardiac myosin inhibitors (CMIs), clinical  trials. He also discussed genetic testing and screening echocardiograms for her children. 3 month follow-up planned.   Dr. Shelah did reach out to cardiology for preoperative input, as she does have pending stress echo (and long term monitor result in process). Per Dr. Santo, on 06/26/2024, This young lady has, at rest, no obstructive physiology. She has been quiescent from an financial controller. She has significant lung disease. Given this, I think it is reasonable to proceed.  )         Anesthesia Quick Evaluation  "

## 2024-07-13 NOTE — Progress Notes (Signed)
 SDW call  Patient was given pre-op instructions over the phone. Patient verbalized understanding of instructions provided.  Denied any SOB, fever or cough   PCP - Dr. Jamee Shanks [facility] Cardiologist - Dr. Darryle [facility]:    PPM/ICD - denies Device Orders - na Rep Notified - na   Chest x-ray - 01/18/2022 EKG -  05/25/2024 Stress Test - ECHO - 06/19/2024 Cardiac Cath -   Sleep Study/sleep apnea/CPAP: denies  Non-diabetic  Blood Thinner Instructions: denies Aspirin Instructions:denies   ERAS Protcol - NPO  Anesthesia review: Yes. Emphysema, HTN, COPD, heart murmur  Your procedure is scheduled on Monday July 16, 2024  Report to [facility] Main Entrance A at  1030  A.M., then check in with the Admitting office.  Call this number if you have problems the morning of surgery:  [phone]   If you have any questions prior to your surgery date call [phone]: Open Monday-Friday 8am-4pm If you experience any cold or flu symptoms such as cough, fever, chills, shortness of breath, etc. between now and your scheduled surgery, please notify us  at the above number    Remember:  Do not eat or drink after midnight the night before your surgery  Take these medicines the morning of surgery with A SIP OF WATER:  Atorvastatin , duloxetine , metoprolol , pregabalin , relegy, vibegron   As needed: Albuterol , norco, meclizine , tramadol   As of today, STOP taking any Aspirin (unless otherwise instructed by your surgeon) Aleve , Naproxen , Ibuprofen, Motrin, Advil, Goody's, BC's, all herbal medications, fish oil, and all vitamins.

## 2024-07-14 ENCOUNTER — Other Ambulatory Visit: Payer: Self-pay | Admitting: Cardiology

## 2024-07-15 ENCOUNTER — Telehealth: Payer: Self-pay | Admitting: Emergency Medicine

## 2024-07-16 DIAGNOSIS — R911 Solitary pulmonary nodule: Secondary | ICD-10-CM | POA: Insufficient documentation

## 2024-07-16 NOTE — Progress Notes (Signed)
 Patient updated with new date/time, 07/30/2024, 1245, NPO. No further questions at this time.

## 2024-07-17 NOTE — Telephone Encounter (Signed)
 Called and spoke to patient and got her rescheduled for after bronch.NFN

## 2024-07-18 ENCOUNTER — Ambulatory Visit: Payer: Self-pay | Admitting: Internal Medicine

## 2024-07-18 DIAGNOSIS — M797 Fibromyalgia: Secondary | ICD-10-CM | POA: Diagnosis not present

## 2024-07-18 DIAGNOSIS — R001 Bradycardia, unspecified: Secondary | ICD-10-CM

## 2024-07-18 DIAGNOSIS — G4733 Obstructive sleep apnea (adult) (pediatric): Secondary | ICD-10-CM

## 2024-07-18 DIAGNOSIS — I421 Obstructive hypertrophic cardiomyopathy: Secondary | ICD-10-CM | POA: Diagnosis not present

## 2024-07-18 NOTE — Addendum Note (Signed)
 Addended by: RANDY HAMP SAILOR on: 07/18/2024 11:06 AM   Modules accepted: Orders

## 2024-07-18 NOTE — Addendum Note (Signed)
 Addended by: SANTO KELLY A on: 07/18/2024 11:15 AM   Modules accepted: Orders

## 2024-07-19 ENCOUNTER — Ambulatory Visit (HOSPITAL_COMMUNITY)
Admission: RE | Admit: 2024-07-19 | Discharge: 2024-07-19 | Disposition: A | Source: Ambulatory Visit | Attending: Internal Medicine

## 2024-07-19 ENCOUNTER — Ambulatory Visit (HOSPITAL_COMMUNITY): Admission: RE | Admit: 2024-07-19 | Source: Ambulatory Visit

## 2024-07-19 ENCOUNTER — Ambulatory Visit (HOSPITAL_COMMUNITY)

## 2024-07-19 DIAGNOSIS — M797 Fibromyalgia: Secondary | ICD-10-CM

## 2024-07-19 DIAGNOSIS — I421 Obstructive hypertrophic cardiomyopathy: Secondary | ICD-10-CM

## 2024-07-19 DIAGNOSIS — G4733 Obstructive sleep apnea (adult) (pediatric): Secondary | ICD-10-CM

## 2024-07-19 DIAGNOSIS — R001 Bradycardia, unspecified: Secondary | ICD-10-CM

## 2024-07-23 ENCOUNTER — Ambulatory Visit: Admitting: Acute Care

## 2024-07-30 ENCOUNTER — Ambulatory Visit (HOSPITAL_COMMUNITY): Admission: RE | Admit: 2024-07-30 | Source: Home / Self Care | Admitting: Emergency Medicine

## 2024-07-30 ENCOUNTER — Encounter (HOSPITAL_COMMUNITY): Payer: Self-pay | Admitting: Vascular Surgery

## 2024-07-30 ENCOUNTER — Encounter (HOSPITAL_COMMUNITY): Admission: RE | Payer: Self-pay | Source: Home / Self Care

## 2024-07-30 DIAGNOSIS — R911 Solitary pulmonary nodule: Secondary | ICD-10-CM | POA: Insufficient documentation

## 2024-07-30 HISTORY — DX: Other hypertrophic cardiomyopathy: I42.2

## 2024-08-07 ENCOUNTER — Ambulatory Visit: Admitting: Acute Care

## 2024-09-20 ENCOUNTER — Ambulatory Visit

## 2024-09-24 ENCOUNTER — Ambulatory Visit: Admitting: Physician Assistant

## 2024-11-12 ENCOUNTER — Encounter: Admitting: Family Medicine
